# Patient Record
Sex: Male | Born: 1935 | Race: White | Hispanic: No | Smoking: Former smoker
Health system: Southern US, Community
[De-identification: ages and names within clinical notes are randomized; demographics above are authoritative.]

## PROBLEM LIST (undated history)

## (undated) DIAGNOSIS — E785 Hyperlipidemia, unspecified: Secondary | ICD-10-CM

## (undated) DIAGNOSIS — N183 Chronic kidney disease, stage 3 unspecified: Secondary | ICD-10-CM

## (undated) DIAGNOSIS — D649 Anemia, unspecified: Secondary | ICD-10-CM

## (undated) DIAGNOSIS — I447 Left bundle-branch block, unspecified: Secondary | ICD-10-CM

## (undated) DIAGNOSIS — Z8489 Family history of other specified conditions: Secondary | ICD-10-CM

## (undated) DIAGNOSIS — I10 Essential (primary) hypertension: Secondary | ICD-10-CM

## (undated) DIAGNOSIS — R972 Elevated prostate specific antigen [PSA]: Secondary | ICD-10-CM

## (undated) DIAGNOSIS — Z8709 Personal history of other diseases of the respiratory system: Secondary | ICD-10-CM

## (undated) DIAGNOSIS — K219 Gastro-esophageal reflux disease without esophagitis: Secondary | ICD-10-CM

## (undated) DIAGNOSIS — J449 Chronic obstructive pulmonary disease, unspecified: Secondary | ICD-10-CM

## (undated) DIAGNOSIS — N289 Disorder of kidney and ureter, unspecified: Secondary | ICD-10-CM

## (undated) DIAGNOSIS — I2510 Atherosclerotic heart disease of native coronary artery without angina pectoris: Secondary | ICD-10-CM

## (undated) HISTORY — PX: RECTAL SURGERY: SHX760

## (undated) HISTORY — PX: COLONOSCOPY WITH ESOPHAGOGASTRODUODENOSCOPY (EGD): SHX5779

## (undated) HISTORY — PX: APPENDECTOMY: SHX54

## (undated) NOTE — *Deleted (*Deleted)
07/13/2020  No chief complaint on file.    ZOX:WRUE A Lonnie [name]. is a 46 y.o. male who returns for a cystoscopy/TRUS.   Please see previous notes for details.     There were no vitals taken for this visit. NED. A&Ox3.   No respiratory distress   Abd soft, NT, ND Normal phallus with bilateral descended testicles    Cystoscopy Procedure Note  Patient identification was confirmed, informed consent was obtained, and patient was prepped using Betadine solution.  Lidocaine jelly was administered per urethral meatus.    Preoperative abx where received prior to procedure.     Pre-Procedure: - Inspection reveals a normal caliber ureteral meatus.  Procedure: The flexible cystoscope was introduced without difficulty - No urethral strictures/lesions are present. - {Blank multiple:19197::"Enlarged","Surgically absent","Normal"} prostate *** - {Blank multiple:19197::"Normal","Elevated","Tight"} bladder neck - Bilateral ureteral orifices identified - Bladder mucosa  reveals no ulcers, tumors, or lesions - No bladder stones - No trabeculation  Retroflexion shows ***   Post-Procedure: - Patient tolerated the procedure well    Prostate transrectal ultrasound sizing   Informed consent was obtained after discussing risks/benefits of the procedure.  A time out was performed to ensure correct patient identity.   Pre-Procedure: -Transrectal probe was placed without difficulty -Transrectal Ultrasound performed revealing a *** gm prostate measuring *** x *** x *** cm (length) -No significant hypoechoic or median lobe noted      Assessment/ Plan:  1. BPH with incomplete bladder emptying  - The patient is a good/poor candidate for Urolift  I, Theador Hawthorne, am acting as a scribe for Dr. Lorin Picket C. Stoioff,  {Add Holiday representative

---

## 2004-12-28 ENCOUNTER — Ambulatory Visit: Payer: Self-pay | Admitting: Ophthalmology

## 2005-01-04 ENCOUNTER — Ambulatory Visit: Payer: Self-pay | Admitting: Ophthalmology

## 2005-04-06 ENCOUNTER — Emergency Department (HOSPITAL_COMMUNITY): Admission: EM | Admit: 2005-04-06 | Discharge: 2005-04-06 | Payer: Self-pay | Admitting: Emergency Medicine

## 2005-04-25 ENCOUNTER — Ambulatory Visit: Payer: Self-pay | Admitting: Internal Medicine

## 2006-07-05 ENCOUNTER — Ambulatory Visit: Payer: Self-pay | Admitting: Internal Medicine

## 2006-11-06 ENCOUNTER — Ambulatory Visit: Payer: Self-pay | Admitting: Specialist

## 2009-04-09 ENCOUNTER — Ambulatory Visit: Payer: Self-pay | Admitting: Specialist

## 2009-07-24 ENCOUNTER — Ambulatory Visit: Payer: Self-pay | Admitting: Internal Medicine

## 2009-10-07 ENCOUNTER — Ambulatory Visit: Payer: Self-pay | Admitting: Specialist

## 2009-11-13 DIAGNOSIS — C4491 Basal cell carcinoma of skin, unspecified: Secondary | ICD-10-CM

## 2009-11-13 HISTORY — DX: Basal cell carcinoma of skin, unspecified: C44.91

## 2010-04-06 ENCOUNTER — Ambulatory Visit: Payer: Self-pay | Admitting: Specialist

## 2012-03-16 ENCOUNTER — Ambulatory Visit: Payer: Self-pay | Admitting: Specialist

## 2012-03-16 LAB — CREATININE, SERUM: Creatinine: 1.66 mg/dL — ABNORMAL HIGH (ref 0.60–1.30)

## 2012-09-17 ENCOUNTER — Ambulatory Visit: Payer: Self-pay | Admitting: Unknown Physician Specialty

## 2012-09-20 LAB — PATHOLOGY REPORT

## 2012-09-29 ENCOUNTER — Inpatient Hospital Stay: Payer: Self-pay | Admitting: Internal Medicine

## 2012-09-29 LAB — COMPREHENSIVE METABOLIC PANEL
Albumin: 3.7 g/dL (ref 3.4–5.0)
Alkaline Phosphatase: 93 U/L (ref 50–136)
Calcium, Total: 8.6 mg/dL (ref 8.5–10.1)
Creatinine: 2.02 mg/dL — ABNORMAL HIGH (ref 0.60–1.30)
EGFR (Non-African Amer.): 30 — ABNORMAL LOW
Glucose: 83 mg/dL (ref 65–99)
Osmolality: 284 (ref 275–301)
SGPT (ALT): 24 U/L (ref 12–78)
Sodium: 139 mmol/L (ref 136–145)

## 2012-09-29 LAB — TROPONIN I: Troponin-I: <0.02 ng/mL

## 2012-09-29 LAB — URINALYSIS, COMPLETE
Bacteria: NONE SEEN
Bilirubin,UR: NEGATIVE
Glucose,UR: NEGATIVE mg/dL (ref 0–75)
Leukocyte Esterase: NEGATIVE
Nitrite: NEGATIVE
RBC,UR: 1 /HPF (ref 0–5)
Specific Gravity: 1.017 (ref 1.003–1.030)
Squamous Epithelial: NONE SEEN
WBC UR: 1 /HPF (ref 0–5)

## 2012-09-29 LAB — RETICULOCYTES
Absolute Retic Count: 0.0492 10*6/uL (ref 0.031–0.129)
Reticulocyte: 1.26 % (ref 0.7–2.5)

## 2012-09-29 LAB — CBC
HGB: 12.7 g/dL — ABNORMAL LOW (ref 13.0–18.0)
RBC: 3.89 10*6/uL — ABNORMAL LOW (ref 4.40–5.90)

## 2012-09-29 LAB — LACTATE DEHYDROGENASE: LDH: 176 U/L (ref 85–241)

## 2012-09-30 LAB — BASIC METABOLIC PANEL
Anion Gap: 8 (ref 7–16)
BUN: 37 mg/dL — ABNORMAL HIGH (ref 7–18)
Chloride: 109 mmol/L — ABNORMAL HIGH (ref 98–107)
Creatinine: 2.18 mg/dL — ABNORMAL HIGH (ref 0.60–1.30)
EGFR (Non-African Amer.): 27 — ABNORMAL LOW
Sodium: 140 mmol/L (ref 136–145)

## 2012-09-30 LAB — CBC WITH DIFFERENTIAL/PLATELET
Basophil #: 0 10*3/uL (ref 0.0–0.1)
Basophil %: 0.2 %
Eosinophil #: 0 10*3/uL (ref 0.0–0.7)
Eosinophil %: 0 %
HGB: 10.7 g/dL — ABNORMAL LOW (ref 13.0–18.0)
Lymphocyte %: 4.1 %
MCH: 32.3 pg (ref 26.0–34.0)
MCHC: 34.9 g/dL (ref 32.0–36.0)
Neutrophil #: 7.3 10*3/uL — ABNORMAL HIGH (ref 1.4–6.5)
Neutrophil %: 91.2 %
Platelet: 99 10*3/uL — ABNORMAL LOW (ref 150–440)
RDW: 13.5 % (ref 11.5–14.5)

## 2012-10-01 LAB — BASIC METABOLIC PANEL
Calcium, Total: 8.8 mg/dL (ref 8.5–10.1)
Chloride: 107 mmol/L (ref 98–107)
Co2: 21 mmol/L (ref 21–32)
Glucose: 133 mg/dL — ABNORMAL HIGH (ref 65–99)
Osmolality: 284 (ref 275–301)
Potassium: 4 mmol/L (ref 3.5–5.1)

## 2012-10-01 LAB — CBC WITH DIFFERENTIAL/PLATELET
Basophil %: 0.1 %
Eosinophil #: 0 10*3/uL (ref 0.0–0.7)
Eosinophil %: 0 %
HGB: 10.4 g/dL — ABNORMAL LOW (ref 13.0–18.0)
Lymphocyte #: 0.6 10*3/uL — ABNORMAL LOW (ref 1.0–3.6)
MCH: 31.4 pg (ref 26.0–34.0)
MCHC: 34.5 g/dL (ref 32.0–36.0)
MCV: 91 fL (ref 80–100)
Monocyte #: 0.6 x10 3/mm (ref 0.2–1.0)
Neutrophil #: 11.5 10*3/uL — ABNORMAL HIGH (ref 1.4–6.5)
Neutrophil %: 91.1 %
RBC: 3.32 10*6/uL — ABNORMAL LOW (ref 4.40–5.90)

## 2012-10-02 LAB — BASIC METABOLIC PANEL
Calcium, Total: 9.2 mg/dL (ref 8.5–10.1)
Co2: 21 mmol/L (ref 21–32)
EGFR (African American): 41 — ABNORMAL LOW
EGFR (Non-African Amer.): 35 — ABNORMAL LOW
Glucose: 120 mg/dL — ABNORMAL HIGH (ref 65–99)
Osmolality: 293 (ref 275–301)
Sodium: 141 mmol/L (ref 136–145)

## 2012-10-05 LAB — CULTURE, BLOOD (SINGLE)

## 2013-05-13 DIAGNOSIS — R972 Elevated prostate specific antigen [PSA]: Secondary | ICD-10-CM | POA: Insufficient documentation

## 2013-11-17 ENCOUNTER — Ambulatory Visit: Payer: Self-pay | Admitting: Physician Assistant

## 2014-02-21 ENCOUNTER — Ambulatory Visit: Payer: Self-pay | Admitting: Physician Assistant

## 2014-03-06 DIAGNOSIS — J449 Chronic obstructive pulmonary disease, unspecified: Secondary | ICD-10-CM | POA: Insufficient documentation

## 2014-04-29 ENCOUNTER — Ambulatory Visit: Payer: Self-pay | Admitting: Specialist

## 2014-06-11 DIAGNOSIS — R0600 Dyspnea, unspecified: Secondary | ICD-10-CM | POA: Insufficient documentation

## 2014-06-11 DIAGNOSIS — Z87898 Personal history of other specified conditions: Secondary | ICD-10-CM | POA: Insufficient documentation

## 2014-06-11 DIAGNOSIS — I447 Left bundle-branch block, unspecified: Secondary | ICD-10-CM | POA: Insufficient documentation

## 2014-06-11 DIAGNOSIS — E785 Hyperlipidemia, unspecified: Secondary | ICD-10-CM | POA: Insufficient documentation

## 2014-06-11 DIAGNOSIS — I10 Essential (primary) hypertension: Secondary | ICD-10-CM | POA: Insufficient documentation

## 2014-06-11 DIAGNOSIS — Z8709 Personal history of other diseases of the respiratory system: Secondary | ICD-10-CM | POA: Insufficient documentation

## 2014-06-11 DIAGNOSIS — R42 Dizziness and giddiness: Secondary | ICD-10-CM | POA: Insufficient documentation

## 2015-01-20 NOTE — H&P (Signed)
DATE OF BIRTH:  1926-12-14  CHIEF COMPLAINT:  Shortness of breath and vomiting.   HISTORY OF PRESENT ILLNESS:  The patient is an 81 year old white male with a history of COPD, ex-tobacco history, who developed a 4-day history of cough, which was productive, accompanied by subjective fevers and chills after exposure to wife, who was ill. The patient also had undergone an EGD and colonoscopy 10 days ago for heme-positive stools. The patient was using over-the-counter medications to treat his symptoms. On the morning of admission, he developed sudden onset of postprandial vomiting and gurgling sound according to his wife. He was noted in the Emergency Room to be cyanotic and lethargic, but responded dramatically to BiPAP and avoided intubation. At the time of my evaluation, he was awake and alert and tolerating BiPAP well, but complaining of thirst and sore throat.   PRIMARY CARE DOCTOR:  Dr. Melina Modena.   PULMONOLOGIST:  Dr. Wallene Huh.   PAST MEDICAL HISTORY:  Notable for COPD, iron deficiency anemia, benign prostatic hypertrophy, hypertension and left bundle branch block.   PAST SURGICAL HISTORY:  Cataract excision from left eye April 2006 and cataract excision from right eye in December 2001.   ALLERGIES:  He has no known drug allergies.   HOSPITALIZATIONS:  He has not been hospitalized in over 10 years.   FAMILY HISTORY:  Notable for hypertension.   SOCIAL HISTORY:  He is a nonsmoker, nondrinker. He is an ex-tobacco user, having quit over 10 years ago. He lives at home with his wife.   REVIEW OF SYSTEMS: GENERAL:  Positive for subjective fevers and weight loss due to anorexia for the past several days.  HEENT:  He denies any changes in vision. He does have a history of cataracts with both of them having been removed. He has no history of seasonal rhinitis or sinus pain, but does note some postnasal drip and sore throat. He denies difficulty swallowing.  RESPIRATORY:  Review of systems  positive for cough, wheezing and dyspnea. He denies painful respirations. He does have a history of COPD.  CARDIOVASCULAR:  He denies chest pain, orthopnea, edema and arrhythmia, but does have dyspnea on exertion. No history of palpitations or syncope.  GASTROINTESTINAL AND GENITOURINARY:  He does have sudden onset of nausea and vomiting today after breakfast. No abdominal pain. Some chronic constipation managed with prune juice. No recent changes in bladder or bowel habits. No history of incontinence.  ENDOCRINE:  He denies polyuria, nocturia and thyroid problems.  HEMATOLOGIC:  He does have a history of anemia, but has had no bleeding or bruising.  MUSCULOSKELETAL:  He does have chronic low back pain with some sciatica. He denies any history of neck, knee or hip pain.  NEUROLOGIC:  He denies numbness, weakness, dysarthria or ataxia. No history of strokes or TIAs or seizures. No history of memory loss. He has a history of insomnia treated with zolpidem.   PHYSICAL EXAMINATION:  GENERAL:  This is a well-nourished elderly male currently awake and alert on BiPAP.  VITAL SIGNS:  Initial blood pressure 157/64, repeat 135/50. Pulse was initially 107 and regular, now 90. T-max was 102.2. The patient is currently on BiPAP 10/5 with a pressure support of 5.  HEENT:  Pupils are equal, round and reactive to light. Extraocular movements are intact. Sclerae are anicteric. Oropharynx was not checked due to current use of BiPAP. NECK:  Supple without lymphadenopathy, JVD, thyromegaly or carotid bruits.  LUNGS:  Notable for diminished breath sounds bilaterally with some  wheezing noted. Increased effort noted as well.  CARDIOVASCULAR:  Heart sounds are distant, but regular. Chest wall is nontender. There is no lower extremity edema. Pedal pulses are palpable.  ABDOMEN:  Soft, nontender, nondistended with good bowel sounds and no evidence of hepatosplenomegaly.  MUSCULOSKELETAL:  He has 5/5 strength in all 4  extremities.  SKIN:  Warm and dry without rashes or lesions.  LYMPHATICS:  There is no cervical, axillary, inguinal or supraclavicular lymphadenopathy.  NEUROLOGICAL:  Exam is grossly nonfocal with intact cranial nerves and deep tendon reflexes. The patient is alert and oriented to person, place and time.   ADMISSION LABORATORY DATA:  Sodium 139, potassium 4.1, chloride 107, bicarb 24, BUN 33, creatinine 2.02, glucose 83. White count 6.6, hemoglobin 12.7, platelets 123. Magnesium 1.6. Urinalysis normal. EKG shows a left bundle branch block, which is chronic according to family. Portable chest x-ray shows developing right perihilar infiltrate.   ASSESSMENT AND PLAN:  1.  Acute respiratory failure. The patient avoided intubation by responding dramatically to BiPAP. He will be admitted to the ICU for continued close respiratory monitoring and continued BiPAP while empiric antibiotics and steroids will be started.  2.  Right perihilar pneumonia. I will broaden his coverage to include coverage for an aspiration event since he has had two likely events, one today and one 10 days ago during his EGD. He was given Levaquin in the Emergency Room, but we will change his coverage to Zosyn and azithromycin.  3.  Chronic obstructive pulmonary disease. We will start the patient on steroids at 60 mg with Solu-Medrol IV q. 12 and bronchodilator therapy as well with albuterol and Duonebs.  4.  Hypomagnesemia. Etiology unclear since his potassium is normal. He has had decreased p.o. intake. IV runs started. I have held his diuretic.  5.  Anemia. The patient has had a recent GI workup for iron deficiency anemia, which was largely unrevealing for source. In the light of a current infection, he has a normal white count, and he has thrombocytopenia. This may all be due to acute infection, but also may be due to a bone marrow disorder. We will repeat tomorrow checking retic count and LDH.  6.  History of hypertension. I have  continued verapamil and lisinopril, but have held his diuretic.   ESTIMATED TIME OF CARE:  60 minutes.    ____________________________ Deborra Medina, MD tt:ms D: 09/29/2012 22:59:35 ET T: 09/30/2012 22:49:01 ET JOB#: [id number]  cc: Deborra Medina, MD, <Dictator> Deborra Medina MD ELECTRONICALLY SIGNED 10/02/2012 13:18

## 2015-01-23 NOTE — Discharge Summary (Signed)
PATIENT NAME:  Lonnie [name], Lonnie [name] MR#:  [id number] DATE OF BIRTH:  [birth year]  DATE OF ADMISSION:  09/29/2012 DATE OF DISCHARGE:  10/02/2012  PRIMARY CARE PHYSICIAN:  Vianne Bulls. Arline Asp, MD.  FINAL DIAGNOSES:  1.  Acute respiratory failure, resolved.  2.  Chronic obstructive pulmonary disease exacerbation with history of asbestosis.  3.  Pneumonia.  4.  Acute renal failure/chronic kidney disease.  5.  Thrombocytopenia.  6.  Anemia.  7.  Benign prostatic hypertrophy.   MEDICATIONS ON DISCHARGE:  1.  Verapamil 120 mg 1 tablet daily. 2.  Omeprazole 20 mg 1 capsule twice a day.  3.  Potassium gluconate 595 mg oral tablet twice daily. 4.  Lisinopril 40 mg daily.  5.  Gabapentin 300 mg 2 capsules 3 times daily. 6.  Hydrochlorothiazide 25 mg one-half tablet at daily.  7.  Ambien 10 mg at bedtime p.r.n. insomnia.  8.  Finasteride 5 mg daily.  9.  Ferrous sulfate 325 mg daily.  10.  Aspirin 81 mg daily. 11.  Alfuzosin 10 mg ER daily. 12.  Prednisone 5 mg, 5 tablets day 1, 4 tablets day 2, 3 tablets day 3, two tablets day 4 and 5, 1 tablet day 6 and 7. 13.  Advair Diskus 500/50, 1 inhalation twice daily. 14.  Albuterol ipratropium 0.5 mg/3 mL, 3 mL inhaled 4 times daily. 15.  Spiriva 1 inhalation daily. 16.  Cefuroxime 250 mg, 1 tablet every 12 hours for six days. 17.  Zithromax 250 mg for two more days. 18.  Combivent Respimat CFC free 20 mcg/100 mcg/inhalation, 1 puff 4 times daily.    HOME HEALTH:  Yes. Physical therapy and nurse.   DIET: Low sodium diet, regular consistency.   ACTIVITY: Activity, as tolerated.   REFERRAL: Home health RN and PT, Dr. Arline Asp in one week.   HOSPITAL COURSE: The patient was admitted 09/29/2012 and discharged 10/02/2012.  He  came in with shortness of breath and vomiting.   HISTORY OF PRESENT ILLNESS: This is an 81 year old man with COPD, history of asbestosis, had fever and chills. He was noted to be cyanotic and lethargic and was started on Bi-PAP initially  and admitted to the ICU and treated for pneumonia. Initially started on Levaquin by the ER physician and then Zosyn and azithromycin by the admitting physician.  He was started on IV Solu-Medrol for COPD exacerbation.  For hypomagnesemia he was being given IV magnesium.   LABORATORY AND RADIOLOGICAL DATA DURING HOSPITAL COURSE:  EKG showed a left bundle branch block. ABG showed pH of 7.40, pCO2 of 34, pO2 at 131, FiO2 at 75%, bicarbonate 21.9, oxygen saturation 99% on Bi-PAP. BNP 233. Retic count 1.26, LDH 176. Troponin negative. Magnesium 1.6. White blood cell count 6.6, hemoglobin and hematocrit 12.7 and 36.0, platelet count 122, glucose 83, BUN 33, creatinine 2.02, sodium 139, potassium 4.1, chloride 107, CO2 at 24, calcium 8.6. Liver function tests normal range. Blood cultures negative. Urine cultures negative. Urinalysis negative. Magnesium up at 1.9.  Creatinine went as high as 2.18. Influenza was negative. Creatinine upon discharge 1.74, platelet count upon discharge 104, white blood cell count 12.6.   HOSPITAL COURSE PER PROBLEM LIST:  1.  For the patient's acute respiratory failure, initially required Bi-PAP, on presentation. I saw the patient in the ICU on the 09/30/2012 and he was already on nasal cannula. I transferred him to the floor.  Over the next couple of days we did get him off oxygen altogether.  Pulse oximetry on room  air 94%.  2.  For the patient's COPD exacerbation with history of asbestosis, the patient was started on IV Solu-Medrol, antibiotics, nebulizers. The patient upon discharge still had rhonchi and wheeze in his lung, but he was breathing back to his baseline. He stated he always has some wheeze and rhonchi in his lungs.  He wanted to go home. I did switch the antibiotic over to oral for completion of the course. Prednisone taper was given.  I did add Spiriva to his Advair Diskus.  3.  Pneumonia.  Completion of the course was given.  Initially started on Levaquin and then  switched to Zosyn and Zithromax. I switched him over to Rocephin and Zithromax and upon discharge Ceftin and Zithromax.  4.  Acute renal failure/chronic kidney disease. The patient's creatinine did go up, but then stabilized down at 1.74.  The patient was given IV fluids during the hospital course.  5.  Thrombocytopenia. This is old.  6.  Anemia. Hemoglobin 10.4 upon discharge. The patient is already on iron.  7.  Benign prostatic hypertrophy, on Uroxatral and did have a Foley placed during the hospital stay. He was urinating after Foley was discontinued.  8.  Hypertension. Blood pressure was borderline during the hospital stay. Initially low and then meds were held, secondary to acute renal failure and then restarted upon discharge. Blood pressure upon discharge was 169/74.   Time spent on discharge: 35 minutes.    ____________________________ Tana Conch. Leslye Peer, MD rjw:eg D: 10/02/2012 16:53:20 ET T: 10/02/2012 18:24:10 ET JOB#: [id number]  cc:  1.  Kendrell Lottman J. Leslye Peer, MD, <Dictator> 2.  Vianne Bulls. Arline Asp, MD  Marisue Brooklyn MD ELECTRONICALLY SIGNED 10/04/2012 19:08

## 2015-01-28 DIAGNOSIS — R351 Nocturia: Secondary | ICD-10-CM | POA: Insufficient documentation

## 2015-10-04 DIAGNOSIS — J189 Pneumonia, unspecified organism: Secondary | ICD-10-CM

## 2015-10-04 HISTORY — DX: Pneumonia, unspecified organism: J18.9

## 2016-01-10 ENCOUNTER — Emergency Department: Payer: Medicare Other

## 2016-01-10 ENCOUNTER — Inpatient Hospital Stay
Admission: EM | Admit: 2016-01-10 | Discharge: 2016-01-18 | DRG: 190 | Disposition: A | Discharge status: Skilled Nursing Facility | Payer: Medicare Other | Attending: Internal Medicine | Admitting: Internal Medicine

## 2016-01-10 ENCOUNTER — Encounter: Payer: Self-pay | Admitting: Emergency Medicine

## 2016-01-10 DIAGNOSIS — E785 Hyperlipidemia, unspecified: Secondary | ICD-10-CM | POA: Diagnosis present

## 2016-01-10 DIAGNOSIS — G629 Polyneuropathy, unspecified: Secondary | ICD-10-CM | POA: Diagnosis present

## 2016-01-10 DIAGNOSIS — J440 Chronic obstructive pulmonary disease with acute lower respiratory infection: Secondary | ICD-10-CM | POA: Diagnosis not present

## 2016-01-10 DIAGNOSIS — W19XXXA Unspecified fall, initial encounter: Secondary | ICD-10-CM | POA: Diagnosis present

## 2016-01-10 DIAGNOSIS — Z8249 Family history of ischemic heart disease and other diseases of the circulatory system: Secondary | ICD-10-CM | POA: Diagnosis not present

## 2016-01-10 DIAGNOSIS — Z87891 Personal history of nicotine dependence: Secondary | ICD-10-CM

## 2016-01-10 DIAGNOSIS — Z9049 Acquired absence of other specified parts of digestive tract: Secondary | ICD-10-CM

## 2016-01-10 DIAGNOSIS — N400 Enlarged prostate without lower urinary tract symptoms: Secondary | ICD-10-CM | POA: Diagnosis present

## 2016-01-10 DIAGNOSIS — Z8619 Personal history of other infectious and parasitic diseases: Secondary | ICD-10-CM | POA: Diagnosis not present

## 2016-01-10 DIAGNOSIS — J61 Pneumoconiosis due to asbestos and other mineral fibers: Secondary | ICD-10-CM | POA: Diagnosis present

## 2016-01-10 DIAGNOSIS — K219 Gastro-esophageal reflux disease without esophagitis: Secondary | ICD-10-CM | POA: Diagnosis present

## 2016-01-10 DIAGNOSIS — Z79899 Other long term (current) drug therapy: Secondary | ICD-10-CM | POA: Diagnosis not present

## 2016-01-10 DIAGNOSIS — I10 Essential (primary) hypertension: Secondary | ICD-10-CM | POA: Diagnosis present

## 2016-01-10 DIAGNOSIS — Y939 Activity, unspecified: Secondary | ICD-10-CM | POA: Diagnosis not present

## 2016-01-10 DIAGNOSIS — I2510 Atherosclerotic heart disease of native coronary artery without angina pectoris: Secondary | ICD-10-CM | POA: Diagnosis present

## 2016-01-10 DIAGNOSIS — S41112A Laceration without foreign body of left upper arm, initial encounter: Secondary | ICD-10-CM | POA: Diagnosis present

## 2016-01-10 DIAGNOSIS — J9621 Acute and chronic respiratory failure with hypoxia: Secondary | ICD-10-CM | POA: Diagnosis present

## 2016-01-10 DIAGNOSIS — I447 Left bundle-branch block, unspecified: Secondary | ICD-10-CM | POA: Diagnosis present

## 2016-01-10 DIAGNOSIS — S0990XA Unspecified injury of head, initial encounter: Secondary | ICD-10-CM

## 2016-01-10 DIAGNOSIS — Z7982 Long term (current) use of aspirin: Secondary | ICD-10-CM

## 2016-01-10 DIAGNOSIS — K5900 Constipation, unspecified: Secondary | ICD-10-CM | POA: Diagnosis not present

## 2016-01-10 DIAGNOSIS — J189 Pneumonia, unspecified organism: Secondary | ICD-10-CM | POA: Diagnosis present

## 2016-01-10 HISTORY — DX: Essential (primary) hypertension: I10

## 2016-01-10 HISTORY — DX: Elevated prostate specific antigen (PSA): R97.20

## 2016-01-10 HISTORY — DX: Disorder of kidney and ureter, unspecified: N28.9

## 2016-01-10 HISTORY — DX: Left bundle-branch block, unspecified: I44.7

## 2016-01-10 HISTORY — DX: Chronic obstructive pulmonary disease, unspecified: J44.9

## 2016-01-10 HISTORY — DX: Atherosclerotic heart disease of native coronary artery without angina pectoris: I25.10

## 2016-01-10 LAB — LACTIC ACID, PLASMA
Lactic Acid, Venous: 1.6 mmol/L (ref 0.5–2.0)
Lactic Acid, Venous: 1.4 mmol/L (ref 0.5–2.0)

## 2016-01-10 LAB — BASIC METABOLIC PANEL WITH GFR
Anion gap: 6 (ref 5–15)
BUN: 28 mg/dL — ABNORMAL HIGH (ref 6–20)
CO2: 23 mmol/L (ref 22–32)
Calcium: 8.9 mg/dL (ref 8.9–10.3)
Chloride: 106 mmol/L (ref 101–111)
Creatinine, Ser: 1.65 mg/dL — ABNORMAL HIGH (ref 0.61–1.24)
GFR calc Af Amer: 41 mL/min — ABNORMAL LOW
GFR calc non Af Amer: 36 mL/min — ABNORMAL LOW
Glucose, Bld: 92 mg/dL (ref 65–99)
Potassium: 4.3 mmol/L (ref 3.5–5.1)
Sodium: 135 mmol/L (ref 135–145)

## 2016-01-10 LAB — CBC WITH DIFFERENTIAL/PLATELET
Basophils Absolute: 0 10*3/uL (ref 0–0.1)
Basophils Relative: 0 %
Eosinophils Absolute: 0 10*3/uL (ref 0–0.7)
Eosinophils Relative: 0 %
HEMATOCRIT: 29.3 % — AB (ref 40.0–52.0)
Hemoglobin: 9.8 g/dL — ABNORMAL LOW (ref 13.0–18.0)
LYMPHS PCT: 5 %
Lymphs Abs: 0.7 10*3/uL — ABNORMAL LOW (ref 1.0–3.6)
MCH: 31.5 pg (ref 26.0–34.0)
MCHC: 33.5 g/dL (ref 32.0–36.0)
MCV: 94 fL (ref 80.0–100.0)
MONO ABS: 1.4 10*3/uL — AB (ref 0.2–1.0)
MONOS PCT: 10 %
NEUTROS ABS: 11.6 10*3/uL — AB (ref 1.4–6.5)
Neutrophils Relative %: 85 %
Platelets: 204 10*3/uL (ref 150–440)
RBC: 3.12 MIL/uL — ABNORMAL LOW (ref 4.40–5.90)
RDW: 13.6 % (ref 11.5–14.5)
WBC: 13.7 10*3/uL — ABNORMAL HIGH (ref 3.8–10.6)

## 2016-01-10 LAB — INFLUENZA PANEL BY PCR (TYPE A & B)
H1N1 flu by pcr: NOT DETECTED
INFLAPCR: NEGATIVE
Influenza B By PCR: NEGATIVE

## 2016-01-10 MED ORDER — ACETAMINOPHEN 325 MG PO TABS
650.0000 mg | ORAL_TABLET | Freq: Four times a day (QID) | ORAL | Status: DC | PRN
  Administered 2016-01-15 – 2016-01-17 (×2): 650 mg via ORAL
  Filled 2016-01-10 (×3): qty 2

## 2016-01-10 MED ORDER — FERROUS SULFATE 325 (65 FE) MG PO TABS
325.0000 mg | ORAL_TABLET | Freq: Every day | ORAL | Status: DC
  Administered 2016-01-11 – 2016-01-18 (×8): 325 mg via ORAL
  Filled 2016-01-10 (×9): qty 1

## 2016-01-10 MED ORDER — MAGNESIUM OXIDE 400 (241.3 MG) MG PO TABS
400.0000 mg | ORAL_TABLET | Freq: Every day | ORAL | Status: DC
  Administered 2016-01-11 – 2016-01-18 (×8): 400 mg via ORAL
  Filled 2016-01-10 (×8): qty 1

## 2016-01-10 MED ORDER — SODIUM CHLORIDE 0.9 % IV SOLN
INTRAVENOUS | Status: DC
  Administered 2016-01-10 – 2016-01-13 (×5): via INTRAVENOUS

## 2016-01-10 MED ORDER — TRIAMCINOLONE ACETONIDE 0.1 % EX CREA
1.0000 "application " | TOPICAL_CREAM | Freq: Two times a day (BID) | CUTANEOUS | Status: DC
  Administered 2016-01-11 – 2016-01-17 (×11): 1 via TOPICAL
  Filled 2016-01-10: qty 15

## 2016-01-10 MED ORDER — ACETAMINOPHEN 650 MG RE SUPP: 650.0000 mg | Freq: Four times a day (QID) | RECTAL | Status: DC | PRN

## 2016-01-10 MED ORDER — RAMELTEON 8 MG PO TABS
8.0000 mg | ORAL_TABLET | Freq: Every day | ORAL | Status: DC
  Administered 2016-01-10 – 2016-01-17 (×8): 8 mg via ORAL
  Filled 2016-01-10 (×9): qty 1

## 2016-01-10 MED ORDER — ONDANSETRON HCL 4 MG/2ML IJ SOLN
4.0000 mg | Freq: Four times a day (QID) | INTRAMUSCULAR | Status: DC | PRN
Start: 2016-01-10 — End: 2016-01-18

## 2016-01-10 MED ORDER — LEVOFLOXACIN IN D5W 750 MG/150ML IV SOLN: 750.0000 mg | INTRAVENOUS | Status: DC

## 2016-01-10 MED ORDER — ASPIRIN 81 MG PO CHEW
81.0000 mg | CHEWABLE_TABLET | Freq: Every day | ORAL | Status: DC
  Administered 2016-01-11 – 2016-01-16 (×6): 81 mg via ORAL
  Filled 2016-01-10 (×6): qty 1

## 2016-01-10 MED ORDER — VITAMIN C 500 MG PO TABS
1000.0000 mg | ORAL_TABLET | Freq: Every day | ORAL | Status: DC
  Administered 2016-01-11 – 2016-01-18 (×8): 1000 mg via ORAL
  Filled 2016-01-10 (×8): qty 2

## 2016-01-10 MED ORDER — TRAMADOL HCL 50 MG PO TABS
50.0000 mg | ORAL_TABLET | Freq: Four times a day (QID) | ORAL | Status: DC | PRN
  Administered 2016-01-11: 50 mg via ORAL
  Filled 2016-01-10: qty 1

## 2016-01-10 MED ORDER — ZOLPIDEM TARTRATE 5 MG PO TABS
10.0000 mg | ORAL_TABLET | Freq: Every evening | ORAL | Status: DC | PRN
  Administered 2016-01-13 – 2016-01-16 (×3): 10 mg via ORAL
  Filled 2016-01-10 (×3): qty 2

## 2016-01-10 MED ORDER — IPRATROPIUM-ALBUTEROL 0.5-2.5 (3) MG/3ML IN SOLN
3.0000 mL | RESPIRATORY_TRACT | Status: DC | PRN
  Administered 2016-01-11 – 2016-01-12 (×2): 3 mL via RESPIRATORY_TRACT
  Filled 2016-01-10 (×2): qty 3

## 2016-01-10 MED ORDER — ALFUZOSIN HCL ER 10 MG PO TB24
10.0000 mg | ORAL_TABLET | Freq: Every day | ORAL | Status: DC
  Administered 2016-01-11 – 2016-01-18 (×8): 10 mg via ORAL
  Filled 2016-01-10 (×10): qty 1

## 2016-01-10 MED ORDER — SODIUM CHLORIDE 0.9 % IV BOLUS (SEPSIS)
1000.0000 mL | Freq: Once | INTRAVENOUS | Status: AC
  Administered 2016-01-10: 1000 mL via INTRAVENOUS

## 2016-01-10 MED ORDER — ONDANSETRON HCL 4 MG PO TABS: 4.0000 mg | ORAL_TABLET | Freq: Four times a day (QID) | ORAL | Status: DC | PRN

## 2016-01-10 MED ORDER — GUAIFENESIN-DM 100-10 MG/5ML PO SYRP
5.0000 mL | ORAL_SOLUTION | ORAL | Status: DC | PRN
  Administered 2016-01-10 – 2016-01-11 (×2): 5 mL via ORAL
  Filled 2016-01-10 (×2): qty 5

## 2016-01-10 MED ORDER — LISINOPRIL 20 MG PO TABS
40.0000 mg | ORAL_TABLET | Freq: Every day | ORAL | Status: DC
  Administered 2016-01-11 – 2016-01-18 (×8): 40 mg via ORAL
  Filled 2016-01-10 (×8): qty 2

## 2016-01-10 MED ORDER — ATORVASTATIN CALCIUM 20 MG PO TABS
80.0000 mg | ORAL_TABLET | Freq: Every day | ORAL | Status: DC
  Administered 2016-01-10 – 2016-01-18 (×9): 80 mg via ORAL
  Filled 2016-01-10 (×9): qty 4

## 2016-01-10 MED ORDER — PANTOPRAZOLE SODIUM 40 MG PO TBEC
40.0000 mg | DELAYED_RELEASE_TABLET | Freq: Every day | ORAL | Status: DC
  Administered 2016-01-11 – 2016-01-18 (×8): 40 mg via ORAL
  Filled 2016-01-10 (×8): qty 1

## 2016-01-10 MED ORDER — LEVOFLOXACIN IN D5W 750 MG/150ML IV SOLN
750.0000 mg | Freq: Once | INTRAVENOUS | Status: AC
  Administered 2016-01-10: 750 mg via INTRAVENOUS
  Filled 2016-01-10: qty 150

## 2016-01-10 MED ORDER — GABAPENTIN 300 MG PO CAPS
900.0000 mg | ORAL_CAPSULE | Freq: Every evening | ORAL | Status: DC
  Administered 2016-01-10 – 2016-01-11 (×2): 900 mg via ORAL
  Filled 2016-01-10 (×2): qty 3

## 2016-01-10 MED ORDER — HEPARIN SODIUM (PORCINE) 5000 UNIT/ML IJ SOLN
5000.0000 [IU] | Freq: Three times a day (TID) | INTRAMUSCULAR | Status: DC
Start: 2016-01-10 — End: 2016-01-11
  Administered 2016-01-10 – 2016-01-11 (×2): 5000 [IU] via SUBCUTANEOUS
  Filled 2016-01-10 (×2): qty 1

## 2016-01-10 MED ORDER — GABAPENTIN 300 MG PO CAPS
600.0000 mg | ORAL_CAPSULE | ORAL | Status: DC
  Administered 2016-01-11 – 2016-01-12 (×2): 600 mg via ORAL
  Filled 2016-01-10 (×2): qty 2

## 2016-01-10 NOTE — Progress Notes (Signed)
Pharmacy Antibiotic Note  Lonnie [name]. is a 82 y.o. male admitted on 01/10/2016 with pneumonia.  Pharmacy has been consulted for levofloxacin dosing.  Plan: Patient received levofloxacin 750 mg IV dose x1 today. Will order levofloxacin 750 mg IV q48h to begin on 4/11.  Height: 5\' 11"  (180.3 cm) Weight: 182 lb 12.8 oz (82.918 kg) IBW/kg (Calculated) : 75.3  Temp (24hrs), Avg:98.5 F (36.9 C), Min:98.5 F (36.9 C), Max:98.5 F (36.9 C)   Recent Labs Lab 01/10/16 1416 01/10/16 1642  WBC 13.7*  --   CREATININE 1.65*  --   LATICACIDVEN 1.6 1.4    Estimated Creatinine Clearance: 33.6 mL/min (by C-G formula based on Cr of 1.65).    Allergies  Allergen Reactions  . Sulfa Antibiotics Other (See Comments)    Not sure of reaction. It was too long ago, but he said not to give it to him.     Antimicrobials this admission: levofloxacin 4/9 >>   Microbiology results: 4/9 BCx: Sent  Thank you for allowing pharmacy to be a part of this patient's care.  Lonnie [name], PharmD Clinical Pharmacist 01/10/2016 8:36 PM

## 2016-01-10 NOTE — H&P (Signed)
[facility] NAME: Lonnie [name]    MR#:  [id number]  DATE OF BIRTH:  September 10, 1927  DATE OF ADMISSION:  01/10/2016  PRIMARY CARE PHYSICIAN: BABAOFF, Caryl Bis, MD   REQUESTING/REFERRING PHYSICIAN: Dr. Nance Pear  CHIEF COMPLAINT:   Chief Complaint  Patient presents with  . Influenza  Weakness, fall.  HISTORY OF PRESENT ILLNESS:  Lonnie [name]  is a 82 y.o. male with a known history of COPD, asbestosis, hypertension, history of coronary artery disease, BPH, neuropathy who presents to the hospital due to generalized weakness and a fall and noted to have pneumonia. Patient was recently diagnosed with the flu about a week to 10 days ago and has finished a 5 day course of Tamiflu. His wife recently died in this hospital from complications of the flu, he has not clinically improved and is still has generalized weakness with some myalgias and therefore was brought to the hospital for further evaluation. Patient underwent a chest x-ray in the emergency room which showed evidence of a pneumonia on the left lower lobe. Hospitalist services were contacted further treatment and evaluation. Patient denies any prodromal symptoms of chest pain, shortness of breath, palpitations, nausea, vomiting, dizziness prior to his fall today.  PAST MEDICAL HISTORY:   Past Medical History  Diagnosis Date  . COPD (chronic obstructive pulmonary disease) ([place])   . Hypertension   . Coronary artery disease   . Bundle branch block, left   . Renal disorder   . PSA elevation     PAST SURGICAL HISTORY:   Past Surgical History  Procedure Laterality Date  . Rectal surgery      x3  . Appendectomy      SOCIAL HISTORY:   Social History  Substance Use Topics  . Smoking status: Former Smoker -- 2.00 packs/day for 50 years    Types: Cigarettes  . Smokeless tobacco: Not on file  . Alcohol Use: 0.0 oz/week    0 Standard drinks or equivalent per week     FAMILY HISTORY:   Family History  Problem Relation Age of Onset  . Heart attack Mother   . Heart attack Father     DRUG ALLERGIES:  No Known Allergies  REVIEW OF SYSTEMS:   Review of Systems  Constitutional: Positive for malaise/fatigue. Negative for fever and weight loss.  HENT: Negative for congestion, nosebleeds and tinnitus.   Eyes: Negative for blurred vision, double vision and redness.  Respiratory: Positive for cough, shortness of breath and wheezing. Negative for hemoptysis.   Cardiovascular: Negative for chest pain, orthopnea, leg swelling and PND.  Gastrointestinal: Negative for nausea, vomiting, abdominal pain, diarrhea and melena.  Genitourinary: Negative for dysuria, urgency and hematuria.  Musculoskeletal: Negative for joint pain and falls.  Neurological: Positive for weakness. Negative for dizziness, tingling, sensory change, focal weakness, seizures and headaches.  Endo/Heme/Allergies: Negative for polydipsia. Does not bruise/bleed easily.  Psychiatric/Behavioral: Negative for depression and memory loss. The patient is not nervous/anxious.     MEDICATIONS AT HOME:   Prior to Admission medications   Medication Sig Start Date End Date Taking? Authorizing Provider  albuterol (ACCUNEB) 0.63 MG/3ML nebulizer solution Take 1 ampule by nebulization every 6 (six) hours as needed for wheezing.   Yes Historical Provider, MD  alfuzosin (UROXATRAL) 10 MG 24 hr tablet Take 10 mg by mouth daily with breakfast.   Yes Historical Provider, MD  ascorbic acid (VITAMIN C) 1000 MG tablet Take 1,000 mg by mouth  daily.   Yes Historical Provider, MD  aspirin 81 MG tablet Take 81 mg by mouth daily.   Yes Historical Provider, MD  atorvastatin (LIPITOR) 80 MG tablet Take 80 mg by mouth daily.   Yes Historical Provider, MD  ferrous sulfate 325 (65 FE) MG tablet Take 325 mg by mouth daily with breakfast.   Yes Historical Provider, MD  finasteride (PROSCAR) 5 MG tablet Take 5 mg by mouth  daily.   Yes Historical Provider, MD  gabapentin (NEURONTIN) 300 MG capsule Take 600 mg by mouth every morning.    Yes Historical Provider, MD  gabapentin (NEURONTIN) 300 MG capsule Take 900 mg by mouth every evening.   Yes Historical Provider, MD  ipratropium (ATROVENT) 0.02 % nebulizer solution Take 0.5 mg by nebulization 4 (four) times daily.   Yes Historical Provider, MD  lisinopril (PRINIVIL,ZESTRIL) 40 MG tablet Take 40 mg by mouth daily.   Yes Historical Provider, MD  magnesium oxide (MAG-OX) 400 MG tablet Take 400 mg by mouth daily.   Yes Historical Provider, MD  omeprazole (PRILOSEC) 20 MG capsule Take 20 mg by mouth daily.   Yes Historical Provider, MD  traMADol (ULTRAM) 50 MG tablet Take by mouth every 6 (six) hours as needed.   Yes Historical Provider, MD  triamcinolone cream (KENALOG) 0.1 % Apply 1 application topically 2 (two) times daily.   Yes Historical Provider, MD  verapamil (CALAN) 120 MG tablet Take 120 mg by mouth daily.    Yes Historical Provider, MD  zolpidem (AMBIEN) 10 MG tablet Take 10 mg by mouth at bedtime as needed for sleep.   Yes Historical Provider, MD      VITAL SIGNS:  Blood pressure 161/63, pulse 90, temperature 98.5 F (36.9 C), temperature source Oral, resp. rate 21, height 5\' 11"  (1.803 m), weight 79.833 kg (176 lb), SpO2 98 %.  PHYSICAL EXAMINATION:  Physical Exam  GENERAL:  82 y.o.-year-old patient lying in the bed lethargic but in NAD EYES: Pupils equal, round, reactive to light and accommodation. No scleral icterus. Extraocular muscles intact.  HEENT: Head atraumatic, normocephalic. Oropharynx and nasopharynx clear. No oropharyngeal erythema, moist oral mucosa  NECK:  Supple, no jugular venous distention. No thyroid enlargement, no tenderness.  LUNGS: Prolonged inspiratory and expiratory phase, minimal end expiratory wheezing bilaterally, rhonchi/rales at the left lower base. No use of accessory muscles of respiration.  CARDIOVASCULAR: S1, S2 RRR.  No murmurs, rubs, gallops, clicks.  ABDOMEN: Soft, nontender, nondistended. Bowel sounds present. No organomegaly or mass.  EXTREMITIES: No pedal edema, cyanosis, or clubbing. + 2 pedal & radial pulses b/l.   NEUROLOGIC: Cranial nerves II through XII are intact. No focal Motor or sensory deficits appreciated b/l.  Globally weak PSYCHIATRIC: The patient is alert and oriented x 3. Good affect.  SKIN: No obvious rash, lesion, or ulcer.   LABORATORY PANEL:   CBC  Recent Labs Lab 01/10/16 1416  WBC 13.7*  HGB 9.8*  HCT 29.3*  PLT 204   ------------------------------------------------------------------------------------------------------------------  Chemistries   Recent Labs Lab 01/10/16 1416  NA 135  K 4.3  CL 106  CO2 23  GLUCOSE 92  BUN 28*  CREATININE 1.65*  CALCIUM 8.9   ------------------------------------------------------------------------------------------------------------------  Cardiac Enzymes No results for input(s): TROPONINI in the last 168 hours. ------------------------------------------------------------------------------------------------------------------  RADIOLOGY:  Dg Chest 2 View  01/10/2016  CLINICAL DATA:  Prolonged symptoms of weakness and cough related to influenza B. EXAM: CHEST  2 VIEW COMPARISON:  04/26/2014 and 02/21/2014 FINDINGS: Again noted are  bilateral pleural calcifications. Focal airspace disease along the medial left lower lobe. Upper lungs are clear. Heart and mediastinum are stable and within normal limits. Atherosclerotic calcifications in the thoracic aorta. No acute bone abnormalities. IMPRESSION: Airspace disease along the medial left lower lobe. Findings are suggestive for pneumonia. Followup PA and lateral chest X-ray is recommended in 3-4 weeks following trial of antibiotic therapy to ensure resolution and exclude underlying malignancy. Electronically Signed   By: Markus Daft M.D.   On: 01/10/2016 14:23   Ct Head Wo  Contrast  01/10/2016  CLINICAL DATA:  Recent trauma with facial pain, initial encounter EXAM: CT HEAD WITHOUT CONTRAST TECHNIQUE: Contiguous axial images were obtained from the base of the skull through the vertex without intravenous contrast. COMPARISON:  04/06/05 FINDINGS: Mild atrophic changes are seen. Scattered deep white matter hypodensity is noted consistent with chronic white matter ischemic change. No findings to suggest acute hemorrhage, acute infarction or space-occupying mass lesion are noted. The bony calvarium is intact. IMPRESSION: Chronic atrophic and ischemic changes without acute abnormality. Electronically Signed   By: Inez Catalina M.D.   On: 01/10/2016 16:41     IMPRESSION AND PLAN:   82 year old male with past medical history of COPD/asbestosis, BPH, essential hypertension, coronary artery disease who presents to the hospital after generalized weakness and malaise and a recent fall and noted to have pneumonia.  1. Pneumonia-this is superimposed pneumonia after recovering from the flu. -Patient was recently diagnosed with the flu about 10 days ago and has finished treatment with Tamiflu. Now comes in with a left lower lobe infiltrate noted on chest x-ray. -Treat patient with IV Levaquin, follow blood, sputum cultures.  2. Generalized weakness/malaise/falls-secondary to pneumonia, recent flu. -Supportive care with IV fluids, antibiotics as mentioned above. I will recheck flu for PCR. Physical therapy consult to assess mobility.  3. Essential hypertension-continue lisinopril.  4. Hyperlipidemia-continue atorvastatin.  5. Neuropathy-continue gabapentin.  6. GERD-continue Protonix.    All the records are reviewed and case discussed with ED provider. Management plans discussed with the patient, family and they are in agreement.  CODE STATUS: Full code  TOTAL TIME TAKING CARE OF THIS PATIENT: 45 minutes.    Henreitta Leber M.D on 01/10/2016 at 5:48 PM  Between 7am to 6pm  - Pager - [phone]  After 6pm go to [email or web address] - password [email or web address]  Office  [phone]  CC: Primary care physician; BABAOFF, Caryl Bis, MD

## 2016-01-10 NOTE — ED Provider Notes (Signed)
[facility] Emergency Department Provider Note    ____________________________________________  Time seen: ~1430  I have reviewed the triage vital signs and the nursing notes.   HISTORY  Chief Complaint Influenza   History limited by: Not Limited, some history obtained from daughter   HPI Lonnie [name]. is a 82 y.o. male who presents to the emergency department today because of concerns for continued shortness of breath and cough. The patient has had these symptoms for the past 2 weeks. He was diagnosed with influenza B and was put on Tamiflu. Family states that he didn't hear to improve however that for the past couple of days he has gotten worse. He has also had associated symptom of weakness. They did call his primary care who ordered. Levaquin tablets. They state he took one dose yesterday. He fell today continued to be weak so he was transported to the emergency department.    Past Medical History  Diagnosis Date  . COPD (chronic obstructive pulmonary disease) ([place])   . Hypertension   . Coronary artery disease   . Bundle branch block, left   . Renal disorder   . PSA elevation     There are no active problems to display for this patient.   Past Surgical History  Procedure Laterality Date  . Rectal surgery      x3  . Appendectomy      No current outpatient prescriptions on file.  Allergies Review of patient's allergies indicates no known allergies.  History reviewed. No pertinent family history.  Social History Social History  Substance Use Topics  . Smoking status: Former Research scientist (life sciences)  . Smokeless tobacco: None  . Alcohol Use: Yes    Review of Systems  Constitutional: Negative for fever. Cardiovascular: Negative for chest pain. Respiratory: Positive for shortness of breath, cough. Gastrointestinal: Negative for abdominal pain, vomiting and diarrhea. Neurological: Negative for headaches, focal weakness or numbness.  10-point  ROS otherwise negative.  ____________________________________________   PHYSICAL EXAM:  VITAL SIGNS: ED Triage Vitals  Enc Vitals Group     BP 01/10/16 1339 174/70 mmHg     Pulse Rate 01/10/16 1339 106     Resp 01/10/16 1339 22     Temp 01/10/16 1339 98.5 F (36.9 C)     Temp Source 01/10/16 1339 Oral     SpO2 01/10/16 1339 92 %     Weight 01/10/16 1339 176 lb (79.833 kg)     Height 01/10/16 1339 5\' 11"  (1.803 m)   Constitutional: Alert and oriented. Well appearing and in no distress. Eyes: Conjunctivae are normal. PERRL. Normal extraocular movements. ENT   Head: Normocephalic and atraumatic.   Nose: No congestion/rhinnorhea.   Mouth/Throat: Mucous membranes are moist.   Neck: No stridor. Hematological/Lymphatic/Immunilogical: No cervical lymphadenopathy. Cardiovascular: Normal rate, regular rhythm.  No murmurs, rubs, or gallops. Respiratory: Normal respiratory effort without tachypnea nor retractions. Breath sounds are clear and equal bilaterally. No wheezes/rales/rhonchi. Gastrointestinal: Soft and nontender. No distention.  Genitourinary: Deferred Musculoskeletal: Normal range of motion in all extremities. No joint effusions.  No lower extremity tenderness nor edema. Neurologic:  Normal speech and language. No gross focal neurologic deficits are appreciated.  Skin:  Skin is warm, dry and intact. No rash noted. Psychiatric: Mood and affect are normal. Speech and behavior are normal. Patient exhibits appropriate insight and judgment.  ____________________________________________    LABS (pertinent positives/negatives)  Labs Reviewed  CBC WITH DIFFERENTIAL/PLATELET - Abnormal; Notable for the following:    WBC 13.7 (*)  RBC 3.12 (*)    Hemoglobin 9.8 (*)    HCT 29.3 (*)    Neutro Abs 11.6 (*)    Lymphs Abs 0.7 (*)    Monocytes Absolute 1.4 (*)    All other components within normal limits  BASIC METABOLIC PANEL - Abnormal; Notable for the following:     BUN 28 (*)    Creatinine, Ser 1.65 (*)    GFR calc non Af Amer 36 (*)    GFR calc Af Amer 41 (*)    All other components within normal limits  CULTURE, BLOOD (ROUTINE X 2)  CULTURE, BLOOD (ROUTINE X 2)  LACTIC ACID, PLASMA  LACTIC ACID, PLASMA     ____________________________________________   EKG  None  ____________________________________________    RADIOLOGY  CT head  IMPRESSION: Chronic atrophic and ischemic changes without acute abnormality.  CXR IMPRESSION: Airspace disease along the medial left lower lobe. Findings are suggestive for pneumonia.  Followup PA and lateral chest X-ray is recommended in 3-4 weeks following trial of antibiotic therapy to ensure resolution and exclude underlying malignancy.  ____________________________________________   PROCEDURES  Procedure(s) performed: None  Critical Care performed: No  ____________________________________________   INITIAL IMPRESSION / ASSESSMENT AND PLAN / ED COURSE  Pertinent labs & imaging results that were available during my care of the patient were reviewed by me and considered in my medical decision making (see chart for details).  Patient presented to the emergency department today because of concern for continued cough, shortness of breath and weakness. Recently was diagnosed and treated for influenza. CXR today concerning for pneumonia. WBC count is elevated. Will give IV antibiotics and admit to hospitalist service.   ____________________________________________   FINAL CLINICAL IMPRESSION(S) / ED DIAGNOSES  Final diagnoses:  Community acquired pneumonia     Nance Pear, MD 01/10/16 [phone]

## 2016-01-10 NOTE — ED Notes (Signed)
Pt presents to ED with prolonged symptoms of weakness and cough related to previous dx of influenza b 12/31/2015. Per EMS axillary temp 100. Pt is alert and oriented.

## 2016-01-11 LAB — BASIC METABOLIC PANEL
ANION GAP: 3 — AB (ref 5–15)
BUN: 28 mg/dL — ABNORMAL HIGH (ref 6–20)
CALCIUM: 8.5 mg/dL — AB (ref 8.9–10.3)
CHLORIDE: 111 mmol/L (ref 101–111)
CO2: 22 mmol/L (ref 22–32)
Creatinine, Ser: 1.35 mg/dL — ABNORMAL HIGH (ref 0.61–1.24)
GFR calc non Af Amer: 46 mL/min — ABNORMAL LOW (ref >60)
GFR, EST AFRICAN AMERICAN: 53 mL/min — AB (ref >60)
Glucose, Bld: 124 mg/dL — ABNORMAL HIGH (ref 65–99)
Potassium: 4.4 mmol/L (ref 3.5–5.1)
SODIUM: 136 mmol/L (ref 135–145)

## 2016-01-11 LAB — CBC
HCT: 25.4 % — ABNORMAL LOW (ref 40.0–52.0)
HEMOGLOBIN: 8.5 g/dL — AB (ref 13.0–18.0)
MCH: 31.6 pg (ref 26.0–34.0)
MCHC: 33.5 g/dL (ref 32.0–36.0)
MCV: 94.4 fL (ref 80.0–100.0)
PLATELETS: 180 10*3/uL (ref 150–440)
RBC: 2.68 MIL/uL — AB (ref 4.40–5.90)
RDW: 14 % (ref 11.5–14.5)
WBC: 12.2 10*3/uL — AB (ref 3.8–10.6)

## 2016-01-11 MED ORDER — ENOXAPARIN SODIUM 40 MG/0.4ML ~~LOC~~ SOLN
40.0000 mg | SUBCUTANEOUS | Status: DC
  Administered 2016-01-11 – 2016-01-16 (×6): 40 mg via SUBCUTANEOUS
  Filled 2016-01-11 (×6): qty 0.4

## 2016-01-11 MED ORDER — GUAIFENESIN-CODEINE 100-10 MG/5ML PO SOLN
5.0000 mL | ORAL | Status: DC | PRN
  Administered 2016-01-11 – 2016-01-13 (×7): 5 mL via ORAL
  Filled 2016-01-11 (×7): qty 5

## 2016-01-11 MED ORDER — LEVOFLOXACIN 750 MG PO TABS
750.0000 mg | ORAL_TABLET | ORAL | Status: DC
  Administered 2016-01-12: 750 mg via ORAL
  Filled 2016-01-11: qty 1

## 2016-01-11 MED ORDER — TRAMADOL HCL 50 MG PO TABS
50.0000 mg | ORAL_TABLET | Freq: Two times a day (BID) | ORAL | Status: DC
  Administered 2016-01-11 – 2016-01-12 (×2): 50 mg via ORAL
  Filled 2016-01-11 (×2): qty 1

## 2016-01-11 NOTE — Care Management Note (Signed)
Case Management Note  Patient Details  Name: Normal Gress. MRN: [id number] Date of Birth: 06/07/1927  Subjective/Objective:                 Patient admitted from home with PNA superimposed after recovering from the flu.  Patient lives at home alone, after recently losing his wife to complications from complications with the flu.  Patient has 2 adult daughters that live locally for support.  Patient obtains his medications from [facility] drug.  Patient states that at baseline he is independent of ADL's and still drives.  Patient is currently on acute Oxygen.  PT is currently recommending SNF.  CSW aware.    Action/Plan: Will need qualifying O2 sats if need at time of discharge and patient returns home.  RNCM following for disposition and home health needs.  [facility].  I have contacted [facility] and spoke with Margarita Grizzle and faxed appropriate documentation.  Per Dr. Tressia Miners anticipated discharge within 48 hours.   Expected Discharge Date:                  Expected Discharge Plan:     In-House Referral:     Discharge planning Services     Post Acute Care Choice:    Choice offered to:     DME Arranged:    DME Agency:     HH Arranged:    [place] Agency:     Status of Service:     Medicare Important Message Given:    Date Medicare IM Given:    Medicare IM give by:    Date Additional Medicare IM Given:    Additional Medicare Important Message give by:     If discussed at [place] of Stay Meetings, dates discussed:    Additional Comments:  Beverly Sessions, RN 01/11/2016, 3:03 PM

## 2016-01-11 NOTE — Consult Note (Addendum)
WOC wound consult note Reason for Consult: Consult requested for left arm skin tears.  Pt fell prior to admission. Wound type: Several areas of partial thickness skin loss to anterior and posterior elbow area Measurement: Posterior elbow  .1X4X.1cm, C-shaped, 5% pink and dry, skin approximated over 95% of wound Anterior elbow area 1X1X.1cm, 5% pink skin approximated over wound, 95% red and dry Anterior elbow area 3X4X.1cm, 10% pink skin approximated over wound, 90% red and dry Dressing procedure/placement/frequency: Xeroform gauze to promote moist healing and decrease adherence and discomfort with dressing changes. Orders provided for bedside nurses to perform Q day. Discussed plan of care with patient and family member, they deny further questions..   Please re-consult if further assistance is needed.  Thank-you,  Julien Girt MSN, [place], [place], [place], [place]

## 2016-01-11 NOTE — Progress Notes (Signed)
[place] [facility] [place] NAME: Prayag Stueck    MR#:  [id number]  DATE OF BIRTH:  [birth year]  SUBJECTIVE:  CHIEF COMPLAINT:   Chief Complaint  Patient presents with  . Influenza   - recently recovered from influenza illness. Complains of cough and wheezing. -Has complained about low sodium diet. Requesting a regular diet. Also wanted codeine-containing cough syrup because he was taking that as outpatient.  REVIEW OF SYSTEMS:  Review of Systems  Constitutional: Positive for malaise/fatigue. Negative for fever and chills.  HENT: Positive for hearing loss. Negative for ear discharge, ear pain, nosebleeds, sore throat and tinnitus.   Eyes: Negative for blurred vision.  Respiratory: Positive for cough, shortness of breath and wheezing.   Cardiovascular: Negative for chest pain, palpitations and leg swelling.  Gastrointestinal: Negative for nausea, vomiting, abdominal pain, diarrhea and constipation.  Genitourinary: Negative for dysuria.  Musculoskeletal: Positive for back pain. Negative for myalgias.  Neurological: Negative for dizziness, sensory change, speech change, focal weakness, seizures and headaches.  Psychiatric/Behavioral: Negative for depression.    DRUG ALLERGIES:   Allergies  Allergen Reactions  . Sulfa Antibiotics Other (See Comments)    Not sure of reaction. It was too long ago, but he said not to give it to him.     VITALS:  Blood pressure 130/49, pulse 99, temperature 97.9 F (36.6 C), temperature source Oral, resp. rate 20, height 5\' 11"  (1.803 m), weight 82.918 kg (182 lb 12.8 oz), SpO2 94 %.  PHYSICAL EXAMINATION:  Physical Exam  GENERAL:  82 y.o.-year-old patient lying in the bed with no acute distress.  EYES: Pupils equal, round, reactive to light and accommodation. No scleral icterus. Extraocular muscles intact.  HEENT: Head atraumatic, normocephalic. Oropharynx and nasopharynx clear.  NECK:  Supple, no  jugular venous distention. No thyroid enlargement, no tenderness.  LUNGS: scattered wheezing posteriorly at the bases noted.Rhonchorous breath sounds all over the lung fields. No crackles or rales. No use of accessory muscles of respiration.  CARDIOVASCULAR: S1, S2 normal. No rubs, or gallops. 3/6 systolic murmur present. ABDOMEN: Soft, nontender, nondistended. Bowel sounds present. No organomegaly or mass.  EXTREMITIES: No pedal edema, cyanosis, or clubbing.  NEUROLOGIC: Cranial nerves II through XII are intact. Muscle strength 5/5 in all extremities. Sensation intact. Gait not checked.  PSYCHIATRIC: The patient is alert and oriented x 3.  SKIN: No obvious rash, lesion, or ulcer.skin tear noted on his left arm.    LABORATORY PANEL:   CBC  Recent Labs Lab 01/11/16 0608  WBC 12.2*  HGB 8.5*  HCT 25.4*  PLT 180   ------------------------------------------------------------------------------------------------------------------  Chemistries   Recent Labs Lab 01/11/16 0608  NA 136  K 4.4  CL 111  CO2 22  GLUCOSE 124*  BUN 28*  CREATININE 1.35*  CALCIUM 8.5*   ------------------------------------------------------------------------------------------------------------------  Cardiac Enzymes No results for input(s): TROPONINI in the last 168 hours. ------------------------------------------------------------------------------------------------------------------  RADIOLOGY:  Dg Chest 2 View  01/10/2016  CLINICAL DATA:  Prolonged symptoms of weakness and cough related to influenza B. EXAM: CHEST  2 VIEW COMPARISON:  04/26/2014 and 02/21/2014 FINDINGS: Again noted are bilateral pleural calcifications. Focal airspace disease along the medial left lower lobe. Upper lungs are clear. Heart and mediastinum are stable and within normal limits. Atherosclerotic calcifications in the thoracic aorta. No acute bone abnormalities. IMPRESSION: Airspace disease along the medial left lower lobe.  Findings are suggestive for pneumonia. Followup PA and lateral chest X-ray is recommended in 3-4 weeks  following trial of antibiotic therapy to ensure resolution and exclude underlying malignancy. Electronically Signed   By: Markus Daft M.D.   On: 01/10/2016 14:23   Ct Head Wo Contrast  01/10/2016  CLINICAL DATA:  Recent trauma with facial pain, initial encounter EXAM: CT HEAD WITHOUT CONTRAST TECHNIQUE: Contiguous axial images were obtained from the base of the skull through the vertex without intravenous contrast. COMPARISON:  04/06/05 FINDINGS: Mild atrophic changes are seen. Scattered deep white matter hypodensity is noted consistent with chronic white matter ischemic change. No findings to suggest acute hemorrhage, acute infarction or space-occupying mass lesion are noted. The bony calvarium is intact. IMPRESSION: Chronic atrophic and ischemic changes without acute abnormality. Electronically Signed   By: Inez Catalina M.D.   On: 01/10/2016 16:41    EKG:   Orders placed or performed in visit on 09/29/12  . EKG 12-Lead    ASSESSMENT AND PLAN:   82 year old male with past medical history of COPD/asbestosis, BPH, essential hypertension, coronary artery disease who presents to the hospital after generalized weakness and malaise and a recent fall and noted to have pneumonia.  1. Pneumonia - post influenza pneumonia -chest x-ray with left lower lobe. Finished Tamiflu as outpatient. -Blood cultures are pending. -continue Levaquin - duonebs prn for his scattered wheezing. - cough meds added  2. Generalized weakness/malaise/falls-secondary to pneumonia, recent flu.  -Physical therapy consulted and patient might need to go to rehabilitation.  3. Essential hypertension-continue lisinopril.  4. Skin tears- painful, wound consult. From the fall  5. Neuropathy-continue gabapentin.  6. GERD-continue Protonix.  7. DVT Prophylaxis- on lovenox     All the records are reviewed and case discussed  with Care Management/Social Workerr. Management plans discussed with the patient, family and they are in agreement.  CODE STATUS: Full Code  TOTAL TIME TAKING CARE OF THIS PATIENT: 38 minutes.   POSSIBLE D/C IN 2 DAYS, DEPENDING ON CLINICAL CONDITION.   Gladstone Lighter M.D on 01/11/2016 at 2:34 PM  Between 7am to 6pm - Pager - [phone]  After 6pm go to [email or web address] - password [email or web address] [place] [facility]  Office  [phone]  CC: Primary care physician; BABAOFF, Caryl Bis, MD

## 2016-01-11 NOTE — Evaluation (Signed)
Physical Therapy Evaluation Patient Details Name: Lonnie [name]. MRN: [id number] DOB: 07/30/1927 Today's Date: 01/11/2016   History of Present Illness  Pt is a 82 y.o. male with PMH of COPD, asbestos exposure, HTN, CAD, and neuropathy.  Pt presented with generalized weakness and a fall.  Pt was admitted for pneumonia.      Clinical Impression  Prior to admission pt was independent with SPC.  However pt reported that the majority of the time he did not use an AD and recently had a fall.  Pt lives alone.  Pt was CGA for rolling and min assist with sidelying to sit.  Pt was min assist for sit to stand with RW and CGA for ambulation with RW for 3 feet.  Pt reported that he felt very weak and was tired after ambulation.  Pt's vitals during session were: O2 saturation:  94% O2 beginning of session, and 92% O2 after ambulation (all on 2 L/min O2 via nasal cannula), HR: 76 bpm beginning of session, 99 bpm after ambulation.  Due to aforementioned function and strength deficits, pt is in need of skilled physical therapy.  It is recommended that pt is discharged to SNF (pending progress) when medically appropriate.     Follow Up Recommendations SNF (pending progress )    Equipment Recommendations  Rolling walker with 5" wheels    Recommendations for Other Services       Precautions / Restrictions Precautions Precautions: Fall Restrictions Weight Bearing Restrictions: No      Mobility  Bed Mobility Overal bed mobility: Needs Assistance Bed Mobility: Rolling;Sidelying to Sit Rolling: Min guard Sidelying to sit: Min assist       General bed mobility comments: increased time, assist with trunk navigation    Transfers Overall transfer level: Needs assistance Equipment used: Rolling walker (2 wheeled) Transfers: Sit to/from Stand Sit to Stand: Min assist         General transfer comment: increased time, VC's for feet placement   Ambulation/Gait Ambulation/Gait assistance: Min  guard Ambulation Distance (Feet): 3 Feet Assistive device: Rolling walker (2 wheeled) Gait Pattern/deviations: Step-to pattern;Decreased step length - left;Decreased stance time - right;Trunk flexed Gait velocity: decreased       Stairs            Wheelchair Mobility    Modified Rankin (Stroke Patients Only)       Balance Overall balance assessment: Needs assistance Sitting-balance support: Feet supported Sitting balance-Leahy Scale: Good     Standing balance support: Bilateral upper extremity supported (RW ) Standing balance-Leahy Scale: Fair                               Pertinent Vitals/Pain Pain Assessment: No/denies pain  See flow sheet for vitals.     Home Living Family/patient expects to be discharged to:: Private residence Living Arrangements: Alone Available Help at Discharge: Family   Home Access: Stairs to enter Entrance Stairs-Rails: Left Entrance Stairs-Number of Steps: 2 Home Layout: One level Home Equipment: Cane - single point      Prior Function Level of Independence: Independent with assistive device(s) (SPC )         Comments: Pt reported that he ocassionally uses a SPC; but mostly does not use an AD.  Pt recently had a fall      Hand Dominance        Extremity/Trunk Assessment   Upper Extremity Assessment: Overall WFL for tasks  assessed           Lower Extremity Assessment: Generalized weakness      Cervical / Trunk Assessment: Normal  Communication   Communication: No difficulties  Cognition Arousal/Alertness: Awake/alert Behavior During Therapy: WFL for tasks assessed/performed Overall Cognitive Status: Within Functional Limits for tasks assessed                      General Comments   Nursing was contacted and cleared pt for physical therapy.  Pt was agreeable and session was modified due to fatigue.     Exercises        Assessment/Plan    PT Assessment Patient needs continued PT  services  PT Diagnosis Difficulty walking   PT Problem List Decreased strength;Decreased activity tolerance;Decreased mobility;Decreased balance  PT Treatment Interventions DME instruction;Gait training;Stair training;Functional mobility training;Therapeutic activities;Therapeutic exercise;Patient/family education   PT Goals (Current goals can be found in the Care Plan section) Acute Rehab PT Goals Patient Stated Goal: to go home  PT Goal Formulation: With patient Time For Goal Achievement: 01/25/16 Potential to Achieve Goals: Fair    Frequency Min 2X/week   Barriers to discharge        Co-evaluation               End of Session Equipment Utilized During Treatment: Gait belt;Oxygen (2 L/min O2 via nasal cannula ) Activity Tolerance: Patient limited by fatigue Patient left: in chair;with call bell/phone within reach;with chair alarm set Nurse Communication: Mobility status;Precautions         Time: [id number] PT Time Calculation (min) (ACUTE ONLY): 24 min   Charges:         PT G Codes:       Mittie Bodo, SPT  Mittie Bodo 01/11/2016, 11:53 AM

## 2016-01-12 ENCOUNTER — Inpatient Hospital Stay: Payer: Medicare Other

## 2016-01-12 LAB — BASIC METABOLIC PANEL
ANION GAP: 2 — AB (ref 5–15)
BUN: 30 mg/dL — ABNORMAL HIGH (ref 6–20)
CHLORIDE: 111 mmol/L (ref 101–111)
CO2: 24 mmol/L (ref 22–32)
Calcium: 8.4 mg/dL — ABNORMAL LOW (ref 8.9–10.3)
Creatinine, Ser: 1.46 mg/dL — ABNORMAL HIGH (ref 0.61–1.24)
GFR calc non Af Amer: 41 mL/min — ABNORMAL LOW (ref >60)
GFR, EST AFRICAN AMERICAN: 48 mL/min — AB (ref >60)
Glucose, Bld: 104 mg/dL — ABNORMAL HIGH (ref 65–99)
POTASSIUM: 4.2 mmol/L (ref 3.5–5.1)
Sodium: 137 mmol/L (ref 135–145)

## 2016-01-12 LAB — BLOOD GAS, ARTERIAL
Acid-base deficit: 0.1 mmol/L (ref 0.0–2.0)
Allens test (pass/fail): POSITIVE — AB
BICARBONATE: 23.6 meq/L (ref 21.0–28.0)
FIO2: 28
O2 Saturation: 86.2 %
PATIENT TEMPERATURE: 37
pCO2 arterial: 34 mmHg (ref 32.0–48.0)
pH, Arterial: 7.45 (ref 7.350–7.450)
pO2, Arterial: 49 mmHg — ABNORMAL LOW (ref 83.0–108.0)

## 2016-01-12 LAB — CBC
HEMATOCRIT: 26.6 % — AB (ref 40.0–52.0)
HEMOGLOBIN: 9 g/dL — AB (ref 13.0–18.0)
MCH: 32 pg (ref 26.0–34.0)
MCHC: 33.9 g/dL (ref 32.0–36.0)
MCV: 94.3 fL (ref 80.0–100.0)
Platelets: 196 10*3/uL (ref 150–440)
RBC: 2.82 MIL/uL — ABNORMAL LOW (ref 4.40–5.90)
RDW: 13.6 % (ref 11.5–14.5)
WBC: 15.2 10*3/uL — ABNORMAL HIGH (ref 3.8–10.6)

## 2016-01-12 LAB — AMMONIA: AMMONIA: 18 umol/L (ref 9–35)

## 2016-01-12 MED ORDER — MOMETASONE FURO-FORMOTEROL FUM 200-5 MCG/ACT IN AERO
2.0000 | INHALATION_SPRAY | Freq: Two times a day (BID) | RESPIRATORY_TRACT | Status: DC
  Administered 2016-01-12 – 2016-01-18 (×12): 2 via RESPIRATORY_TRACT
  Filled 2016-01-12: qty 8.8

## 2016-01-12 MED ORDER — VANCOMYCIN HCL 10 G IV SOLR
1250.0000 mg | INTRAVENOUS | Status: DC
  Administered 2016-01-12 – 2016-01-13 (×2): 1250 mg via INTRAVENOUS
  Filled 2016-01-12 (×4): qty 1250

## 2016-01-12 MED ORDER — VANCOMYCIN HCL IN DEXTROSE 1-5 GM/200ML-% IV SOLN
1000.0000 mg | Freq: Once | INTRAVENOUS | Status: AC
  Administered 2016-01-12: 1000 mg via INTRAVENOUS
  Filled 2016-01-12: qty 200

## 2016-01-12 MED ORDER — PIPERACILLIN-TAZOBACTAM 3.375 G IVPB
3.3750 g | Freq: Three times a day (TID) | INTRAVENOUS | Status: DC
  Administered 2016-01-12 – 2016-01-18 (×18): 3.375 g via INTRAVENOUS
  Filled 2016-01-12 (×20): qty 50

## 2016-01-12 MED ORDER — BISACODYL 5 MG PO TBEC
10.0000 mg | DELAYED_RELEASE_TABLET | Freq: Every day | ORAL | Status: DC | PRN
Start: 2016-01-12 — End: 2016-01-18
  Administered 2016-01-13: 10 mg via ORAL
  Filled 2016-01-12: qty 2

## 2016-01-12 MED ORDER — TRAMADOL HCL 50 MG PO TABS
50.0000 mg | ORAL_TABLET | Freq: Two times a day (BID) | ORAL | Status: DC | PRN
  Administered 2016-01-13 – 2016-01-15 (×3): 50 mg via ORAL
  Filled 2016-01-12 (×3): qty 1

## 2016-01-12 MED ORDER — SENNOSIDES-DOCUSATE SODIUM 8.6-50 MG PO TABS
2.0000 | ORAL_TABLET | Freq: Two times a day (BID) | ORAL | Status: DC
  Administered 2016-01-12 – 2016-01-18 (×13): 2 via ORAL
  Filled 2016-01-12 (×13): qty 2

## 2016-01-12 MED ORDER — IPRATROPIUM-ALBUTEROL 0.5-2.5 (3) MG/3ML IN SOLN
3.0000 mL | Freq: Four times a day (QID) | RESPIRATORY_TRACT | Status: DC
  Administered 2016-01-12 – 2016-01-18 (×23): 3 mL via RESPIRATORY_TRACT
  Filled 2016-01-12 (×23): qty 3

## 2016-01-12 NOTE — Consult Note (Signed)
Pharmacy Antibiotic Note  Lonnie [name]. is a 82 y.o. male admitted on 01/10/2016 with pneumonia.  Pharmacy has been consulted for zosyn and vancomycin dosing.  Plan: Vancomycin 1g once, then 6 hours later start 1250 q 24 for stacked dosing. Vancomycin 1250 IV every 24 hours.  Goal trough 15-20 mcg/mL. Zosyn 3.375g IV q8h (4 hour infusion). Trough at steady state 4/14 @ 1830  Height: 5\' 11"  (180.3 cm) Weight: 182 lb 12.8 oz (82.918 kg) IBW/kg (Calculated) : 75.3  Temp (24hrs), Avg:98.8 F (37.1 C), Min:98.1 F (36.7 C), Max:100.5 F (38.1 C)   Recent Labs Lab 01/10/16 1416 01/10/16 1642 01/11/16 0608 01/12/16 0410  WBC 13.7*  --  12.2* 15.2*  CREATININE 1.65*  --  1.35* 1.46*  LATICACIDVEN 1.6 1.4  --   --     Estimated Creatinine Clearance: 38 mL/min (by C-G formula based on Cr of 1.46).    Allergies  Allergen Reactions  . Sulfa Antibiotics Other (See Comments)    Not sure of reaction. It was too long ago, but he said not to give it to him.     Antimicrobials this admission: levofloxacin 4/9 >> 4/11 vancomycin 4/11 >>  Zosyn 4/11>>  Dose adjustments this admission:   Microbiology results: 4/9 BCx: neg <24hr  4/9 MRSA PCR: neg  Thank you for allowing pharmacy to be a part of this patient's care.  Ramond Dial, Pharm.D Clinical Pharmacist 01/12/2016 12:18 PM

## 2016-01-12 NOTE — Progress Notes (Signed)
Pt is alert and oriented, no complaints of pain, pt has slept majority of shift.  Pt voids in urinal.  VSS.

## 2016-01-12 NOTE — Progress Notes (Signed)
[facility] [place] NAME: Lonnie [name]    MR#:  [id number]  DATE OF BIRTH:  Jul 22, 1927  SUBJECTIVE:  CHIEF COMPLAINT:   Chief Complaint  Patient presents with  . Influenza   - Complains of constipation. More sleepy today. Daughter at bedside now. -Hypoxic on 2 L, increased to 4 L oxygen. -Continues to have productive cough  REVIEW OF SYSTEMS:  Review of Systems  Constitutional: Positive for malaise/fatigue. Negative for fever and chills.  HENT: Positive for hearing loss. Negative for ear discharge, ear pain, nosebleeds, sore throat and tinnitus.   Eyes: Negative for blurred vision.  Respiratory: Positive for cough, shortness of breath and wheezing.   Cardiovascular: Negative for chest pain, palpitations and leg swelling.  Gastrointestinal: Positive for constipation. Negative for nausea, vomiting, abdominal pain and diarrhea.  Genitourinary: Negative for dysuria.  Musculoskeletal: Positive for back pain. Negative for myalgias.  Neurological: Negative for dizziness, sensory change, speech change, focal weakness, seizures and headaches.  Psychiatric/Behavioral: Negative for depression.    DRUG ALLERGIES:   Allergies  Allergen Reactions  . Sulfa Antibiotics Other (See Comments)    Not sure of reaction. It was too long ago, but he said not to give it to him.     VITALS:  Blood pressure 137/41, pulse 87, temperature 98.5 F (36.9 C), temperature source Oral, resp. rate 20, height 5\' 11"  (1.803 m), weight 82.918 kg (182 lb 12.8 oz), SpO2 95 %.  PHYSICAL EXAMINATION:  Physical Exam  GENERAL:  82 y.o.-year-old patient lying in the bed with no acute distress. More sleepy today. EYES: Pupils equal, round, reactive to light and accommodation. No scleral icterus. Extraocular muscles intact.  HEENT: Head atraumatic, normocephalic. Oropharynx and nasopharynx clear.  NECK:  Supple, no jugular venous distention. No thyroid enlargement,  no tenderness.  LUNGS: scattered wheezing posteriorly at the bases noted.Rhonchorous breath sounds all over the lung fields. No crackles or rales. No use of accessory muscles of respiration.  CARDIOVASCULAR: S1, S2 normal. No rubs, or gallops. 3/6 systolic murmur present. ABDOMEN: Soft, nontender, nondistended. Bowel sounds present. No organomegaly or mass.  EXTREMITIES: No pedal edema, cyanosis, or clubbing.  NEUROLOGIC: Cranial nerves II through XII are intact. Muscle strength 5/5 in all extremities. Sensation intact. Gait not checked.  PSYCHIATRIC: The patient is sleepy, but arousable and oriented 3 then SKIN: No obvious rash, lesion, or ulcer.skin tear noted on his left arm.    LABORATORY PANEL:   CBC  Recent Labs Lab 01/12/16 0410  WBC 15.2*  HGB 9.0*  HCT 26.6*  PLT 196   ------------------------------------------------------------------------------------------------------------------  Chemistries   Recent Labs Lab 01/12/16 0410  NA 137  K 4.2  CL 111  CO2 24  GLUCOSE 104*  BUN 30*  CREATININE 1.46*  CALCIUM 8.4*   ------------------------------------------------------------------------------------------------------------------  Cardiac Enzymes No results for input(s): TROPONINI in the last 168 hours. ------------------------------------------------------------------------------------------------------------------  RADIOLOGY:  Dg Chest 2 View  01/10/2016  CLINICAL DATA:  Prolonged symptoms of weakness and cough related to influenza B. EXAM: CHEST  2 VIEW COMPARISON:  04/26/2014 and 02/21/2014 FINDINGS: Again noted are bilateral pleural calcifications. Focal airspace disease along the medial left lower lobe. Upper lungs are clear. Heart and mediastinum are stable and within normal limits. Atherosclerotic calcifications in the thoracic aorta. No acute bone abnormalities. IMPRESSION: Airspace disease along the medial left lower lobe. Findings are suggestive for  pneumonia. Followup PA and lateral chest X-ray is recommended in 3-4 weeks following trial of  antibiotic therapy to ensure resolution and exclude underlying malignancy. Electronically Signed   By: Markus Daft M.D.   On: 01/10/2016 14:23   Ct Head Wo Contrast  01/10/2016  CLINICAL DATA:  Recent trauma with facial pain, initial encounter EXAM: CT HEAD WITHOUT CONTRAST TECHNIQUE: Contiguous axial images were obtained from the base of the skull through the vertex without intravenous contrast. COMPARISON:  04/06/05 FINDINGS: Mild atrophic changes are seen. Scattered deep white matter hypodensity is noted consistent with chronic white matter ischemic change. No findings to suggest acute hemorrhage, acute infarction or space-occupying mass lesion are noted. The bony calvarium is intact. IMPRESSION: Chronic atrophic and ischemic changes without acute abnormality. Electronically Signed   By: Inez Catalina M.D.   On: 01/10/2016 16:41   Dg Chest Port 1 View  01/12/2016  CLINICAL DATA:  COPD high blood pressure. Hospitalization for generalized weakness and fall. Patient is noted to have a pneumonia. EXAM: PORTABLE CHEST 1 VIEW COMPARISON:  January 10, 2016 FINDINGS: The heart size and mediastinal contours are stable. There is patchy consolidation of bilateral lung bases. Chronic calcified pleural plaques are identified in the left lung base unchanged. The visualized skeletal structures are stable. IMPRESSION: Patchy consolidation of bilateral lung bases, pneumonia not excluded. Electronically Signed   By: Abelardo Diesel M.D.   On: 01/12/2016 13:29    EKG:   Orders placed or performed in visit on 09/29/12  . EKG 12-Lead    ASSESSMENT AND PLAN:   82 year old male with past medical history of COPD/asbestosis, BPH, essential hypertension, coronary artery disease who presents to the hospital after generalized weakness and malaise and a recent fall and noted to have pneumonia.  1. Acute hypoxic respiratory failure-secondary  to post influenza pneumonia -ABG showing hypoxia. Increased oxygen to 4 L -chest x-ray with left lower lobe. Finished Tamiflu as outpatient. Failed outpatient Levaquin -Blood cultures are pending. -Since worsening clinically, repeat chest x-ray today, change antibiotics to vancomycin and Zosyn. Check MRSA PCR - duonebs for his scattered wheezing. Also flutter valve or chest percussion therapy for his cough. -Pulmonary consulted - cough meds added  2. Generalized weakness/malaise/falls-secondary to pneumonia, recent flu.  -Physical therapy consulted and patient might need to go to rehabilitation.  3. Essential hypertension-continue lisinopril.  4. Skin tears- painful, wound consult. From the fall  5. Neuropathy-continue gabapentin. Hold gabapentin as patient is very sleepy.  6. GERD-continue Protonix.  7. DVT Prophylaxis- on lovenox  8. AMS- meds related vs from his infection - hold gabapentin, change tramadol to PRN. Avoid narcotics or benzo's and monitor  Physical Therapy     All the records are reviewed and case discussed with Care Management/Social Workerr. Management plans discussed with the patient, family and they are in agreement.  CODE STATUS: Full Code  TOTAL TIME TAKING CARE OF THIS PATIENT: 38 minutes.   POSSIBLE D/C IN 2 DAYS, DEPENDING ON CLINICAL CONDITION.   Gladstone Lighter M.D on 01/12/2016 at 1:54 PM  Between 7am to 6pm - Pager - [phone]  After 6pm go to [email or web address] - password [email or web address] [facility]  [facility]  Office  [phone]  CC: Primary care physician; No primary care provider on file.

## 2016-01-12 NOTE — Care Management Important Message (Signed)
Important Message  Patient Details  Name: Lonnie [name]. MRN: [id number] Date of Birth: 1927-09-06   Medicare Important Message Given:  Yes    [name] 01/12/2016, 1:19 PM

## 2016-01-12 NOTE — Care Management (Signed)
Per MD patient has decompensated.  MD does no longer feels that patient will discharge within 48 hours.  Patient has signed refusal to transfer to [place].  I have faxed the signed copy to [place] [facility], and placed signed copy in chart.  Patient is still requiring acute O2.  PT recommending SNF.  Patient would like to progress to home health.

## 2016-01-12 NOTE — Progress Notes (Signed)
Date: 01/12/2016,   MRN# [id number] Wardell Hasbargen. June 27, 1927 Code Status:     Code Status Orders        Start     Ordered   01/10/16 2015  Full code   Continuous     01/10/16 2016    Code Status History    Date Active Date Inactive Code Status Order ID Comments User Context   This patient has a current code status but no historical code status.    Advance Directive Documentation        Most Recent Value   Type of Advance Directive  Healthcare Power of Attorney, Living will   Pre-existing out of facility DNR order (yellow form or pink MOST form)     "MOST" Form in Place?       Hosp day:@LENGTHOFSTAYDAYS @ Referring MD: @ATDPROV @          AdmissionWeight: 176 lb (79.833 kg)                 CurrentWeight: 182 lb 12.8 oz (82.918 kg)   CC:   HPI: pt seen, sleeping, will return  PMHX:   Past Medical History  Diagnosis Date  . COPD (chronic obstructive pulmonary disease) ([place])   . Hypertension   . Coronary artery disease   . Bundle branch block, left   . Renal disorder   . PSA elevation    Surgical Hx:  Past Surgical History  Procedure Laterality Date  . Rectal surgery      x3  . Appendectomy     Family Hx:  Family History  Problem Relation Age of Onset  . Heart attack Mother   . Heart attack Father    Social Hx:   Social History  Substance Use Topics  . Smoking status: Former Smoker -- 2.00 packs/day for 50 years    Types: Cigarettes  . Smokeless tobacco: None  . Alcohol Use: 0.0 oz/week    0 Standard drinks or equivalent per week   Medication:    Home Medication:  No current outpatient prescriptions on file.  Current Medication: @CURMEDTAB @   Allergies:  Sulfa antibiotics  Review of Systems: Gen:  Denies  fever, sweats, chills HEENT: Denies blurred vision, double vision, ear pain, eye pain, hearing loss, nose bleeds, sore throat Cvc:  No dizziness, chest pain or heaviness Resp:    Gi: Denies swallowing difficulty, stomach pain, nausea or  vomiting, diarrhea, constipation, bowel incontinence Gu:  Denies bladder incontinence, burning urine Ext:   No Joint pain, stiffness or swelling Skin: No skin rash, easy bruising or bleeding or hives Endoc:  No polyuria, polydipsia , polyphagia or weight change Psych: No depression, insomnia or hallucinations  Other:  All other systems negative  Physical Examination:   VS: BP 137/41 mmHg  Pulse 87  Temp(Src) 98.5 F (36.9 C) (Oral)  Resp 20  Ht 5\' 11"  (1.803 m)  Wt 182 lb 12.8 oz (82.918 kg)  BMI 25.51 kg/m2  SpO2 95%  General Appearance: No distress  Neuro: without focal findings, mental status, speech normal, alert and oriented, cranial nerves 2-12 intact, reflexes normal and symmetric, sensation grossly normal  HEENT: PERRLA, EOM intact, no ptosis, no other lesions noticed, Mallampati: Pulmonary:.No wheezing, No rales  Sputum Production:   Cardiovascular:  Normal S1,S2.  No m/r/g.  Abdominal aorta pulsation normal.    Abdomen:Benign, Soft, non-tender, No masses, hepatosplenomegaly, No lymphadenopathy Endoc: No evident thyromegaly, no signs of acromegaly or Cushing features Skin:   warm, no rashes,  no ecchymosis  Extremities: normal, no cyanosis, clubbing, no edema, warm with normal capillary refill. Other findings:   Labs results:   Recent Labs     01/10/16  1416  01/11/16  0608  01/12/16  0410  HGB  9.8*  8.5*  9.0*  HCT  29.3*  25.4*  26.6*  MCV  94.0  94.4  94.3  WBC  13.7*  12.2*  15.2*  BUN  28*  28*  30*  CREATININE  1.65*  1.35*  1.46*  GLUCOSE  92  124*  104*  CALCIUM  8.9  8.5*  8.4*  ,    No results for input(s): PH in the last 72 hours.  Invalid input(s): PCO2, PO2, BASEEXCESS, BASEDEFICITE, TFT  Culture results:     Rad results:   Dg Chest Port 1 View  01/12/2016  CLINICAL DATA:  COPD high blood pressure. Hospitalization for generalized weakness and fall. Patient is noted to have a pneumonia. EXAM: PORTABLE CHEST 1 VIEW COMPARISON:  January 10, 2016 FINDINGS: The heart size and mediastinal contours are stable. There is patchy consolidation of bilateral lung bases. Chronic calcified pleural plaques are identified in the left lung base unchanged. The visualized skeletal structures are stable. IMPRESSION: Patchy consolidation of bilateral lung bases, pneumonia not excluded. Electronically Signed   By: Abelardo Diesel M.D.   On: 01/12/2016 13:29      EKG:     Other:   Assessment and Plan:    I have personally obtained a history, examined the patient, evaluated laboratory and imaging results, formulated the assessment and plan and placed orders.  The Patient requires high complexity decision making for assessment and support, frequent evaluation and titration of therapies, application of advanced monitoring technologies and extensive interpretation of multiple databases.   Oriyah Lamphear,M.D. Pulmonary & Critical care Medicine [facility]

## 2016-01-13 LAB — CBC
HCT: 25.6 % — ABNORMAL LOW (ref 40.0–52.0)
Hemoglobin: 8.6 g/dL — ABNORMAL LOW (ref 13.0–18.0)
MCH: 31.4 pg (ref 26.0–34.0)
MCHC: 33.8 g/dL (ref 32.0–36.0)
MCV: 92.9 fL (ref 80.0–100.0)
PLATELETS: 186 10*3/uL (ref 150–440)
RBC: 2.75 MIL/uL — ABNORMAL LOW (ref 4.40–5.90)
RDW: 13.5 % (ref 11.5–14.5)
WBC: 15.8 10*3/uL — ABNORMAL HIGH (ref 3.8–10.6)

## 2016-01-13 LAB — BASIC METABOLIC PANEL
Anion gap: 6 (ref 5–15)
BUN: 25 mg/dL — AB (ref 6–20)
CALCIUM: 8.3 mg/dL — AB (ref 8.9–10.3)
CO2: 20 mmol/L — ABNORMAL LOW (ref 22–32)
CREATININE: 1.37 mg/dL — AB (ref 0.61–1.24)
Chloride: 109 mmol/L (ref 101–111)
GFR calc Af Amer: 52 mL/min — ABNORMAL LOW (ref >60)
GFR, EST NON AFRICAN AMERICAN: 45 mL/min — AB (ref >60)
GLUCOSE: 102 mg/dL — AB (ref 65–99)
POTASSIUM: 4.1 mmol/L (ref 3.5–5.1)
Sodium: 135 mmol/L (ref 135–145)

## 2016-01-13 LAB — MRSA PCR SCREENING: MRSA by PCR: NEGATIVE

## 2016-01-13 MED ORDER — LACTULOSE 10 GM/15ML PO SOLN
20.0000 g | Freq: Two times a day (BID) | ORAL | Status: DC | PRN
  Administered 2016-01-13: 20 g via ORAL
  Filled 2016-01-13: qty 30

## 2016-01-13 NOTE — Consult Note (Signed)
Pharmacy Antibiotic Note  Lonnie [name]. is a 82 y.o. male admitted on 01/10/2016 with pneumonia.  Pharmacy has been consulted for zosyn and vancomycin dosing.  Plan: Vancomycin 1g once, then 6 hours later start 1250 q 24 for stacked dosing. Vancomycin 1250 IV every 24 hours.  Goal trough 15-20 mcg/mL. Zosyn 3.375g IV q8h (4 hour infusion). Trough at steady state 4/14 @ 1830  4/12: Patient decompensated (WBC/temp spike) and antibiotics broadened 4/11.  Scr 1.37.   Ke 0.038, t1/2 18.24  Vd 58. Patient borderline for dose adjustment. F/u Scr and MRSA PCR.  Height: 5\' 11"  (180.3 cm) Weight: 182 lb 12.8 oz (82.918 kg) IBW/kg (Calculated) : 75.3  Temp (24hrs), Avg:98.8 F (37.1 C), Min:97.5 F (36.4 C), Max:100.9 F (38.3 C)   Recent Labs Lab 01/10/16 1416 01/10/16 1642 01/11/16 0608 01/12/16 0410 01/13/16 0438  WBC 13.7*  --  12.2* 15.2* 15.8*  CREATININE 1.65*  --  1.35* 1.46* 1.37*  LATICACIDVEN 1.6 1.4  --   --   --     Estimated Creatinine Clearance: 40.5 mL/min (by C-G formula based on Cr of 1.37).    Allergies  Allergen Reactions  . Sulfa Antibiotics Other (See Comments)    Not sure of reaction. It was too long ago, but he said not to give it to him.     Antimicrobials this admission: levofloxacin 4/9 >> 4/11 vancomycin 4/11 >>  Zosyn 4/11>>  Dose adjustments this admission:   Microbiology results: 4/9 BCx: neg <24hr  4/12 MRSA PCR: pending  Thank you for allowing pharmacy to be a part of this patient's care.  Chinita Greenland PharmD Clinical Pharmacist 01/13/2016  3:22 PM

## 2016-01-13 NOTE — Progress Notes (Signed)
Date: 01/13/2016,   MRN# [id number] Kairyn Deskin. December 27, 1926 Code Status:     Code Status Orders        Start     Ordered   01/10/16 2015  Full code   Continuous     01/10/16 2016    Code Status History    Date Active Date Inactive Code Status Order ID Comments User Context   This patient has a current code status but no historical code status.    Advance Directive Documentation        Most Recent Value   Type of Advance Directive  Healthcare Power of Attorney, Living will   Pre-existing out of facility DNR order (yellow form or pink MOST form)     "MOST" Form in Place?       Hosp day:@LENGTHOFSTAYDAYS @ Referring MD: @ATDPROV @            HPI: sob, frial, cough, on 4 liters [place] 02, chest xray noted  PMHX:   Past Medical History  Diagnosis Date  . COPD (chronic obstructive pulmonary disease) ([place])   . Hypertension   . Coronary artery disease   . Bundle branch block, left   . Renal disorder   . PSA elevation    Surgical Hx:  Past Surgical History  Procedure Laterality Date  . Rectal surgery      x3  . Appendectomy     Family Hx:  Family History  Problem Relation Age of Onset  . Heart attack Mother   . Heart attack Father    Social Hx:   Social History  Substance Use Topics  . Smoking status: Former Smoker -- 2.00 packs/day for 50 years    Types: Cigarettes  . Smokeless tobacco: None  . Alcohol Use: 0.0 oz/week    0 Standard drinks or equivalent per week   Medication:    Home Medication:  No current outpatient prescriptions on file.  Current Medication: @CURMEDTAB @   Allergies:  Sulfa antibiotics  Review of Systems: Gen:  Denies  fever, sweats, chills HEENT: Denies blurred vision, double vision, ear pain, eye pain, hearing loss, nose bleeds, sore throat Cvc:  No dizziness, chest pain or heaviness Resp:  Cough, sob, weak  Gi: Denies swallowing difficulty, stomach pain, nausea or vomiting, diarrhea, constipation, bowel incontinence Gu:   Denies bladder incontinence, burning urine Ext:   No Joint pain, stiffness or swelling Skin: No skin rash, easy bruising or bleeding or hives Endoc:  No polyuria, polydipsia , polyphagia or weight change Psych: No depression, insomnia or hallucinations  Other:  All other systems negative  Physical Examination:   VS: BP 134/95 mmHg  Pulse 95  Temp(Src) 99 F (37.2 C) (Oral)  Resp 23  Ht 5\' 11"  (1.803 m)  Wt 182 lb 12.8 oz (82.918 kg)  BMI 25.51 kg/m2  SpO2 95%  General Appearance: No distress, frial on 4 liters [place] 02 Neuro: without focal findings, mental status, speech normal, alert and oriented, cranial nerves 2-12 intact, reflexes normal and symmetric, sensation grossly normal  HEENT: PERRLA, EOM intact, no ptosis, no other lesions noticed, Mallampati: Pulmonary:.No wheezing, No rales    Cardiovascular:  Normal S1,S2.  No m/r/g.  Abdominal aorta pulsation normal.    Abdomen:Benign, Soft, non-tender, No masses, hepatosplenomegaly, No lymphadenopathy Endoc: No evident thyromegaly, no signs of acromegaly or Cushing features Skin:   warm, no rashes, no ecchymosis  Extremities: normal, no cyanosis, clubbing, no edema, warm with normal capillary refill. Other findings:   Labs  results:   Recent Labs     01/11/16  0608  01/12/16  0410  01/13/16  0438  HGB  8.5*  9.0*  8.6*  HCT  25.4*  26.6*  25.6*  MCV  94.4  94.3  92.9  WBC  12.2*  15.2*  15.8*  BUN  28*  30*  25*  CREATININE  1.35*  1.46*  1.37*  GLUCOSE  124*  104*  102*  CALCIUM  8.5*  8.4*  8.3*  ,      Rad results:  CLINICAL DATA: COPD high blood pressure. Hospitalization for generalized weakness and fall. Patient is noted to have a pneumonia.  EXAM: PORTABLE CHEST 1 VIEW  COMPARISON: January 10, 2016  FINDINGS: The heart size and mediastinal contours are stable. There is patchy consolidation of bilateral lung bases. Chronic calcified pleural plaques are identified in the left lung base unchanged.  The visualized skeletal structures are stable.  IMPRESSION: Patchy consolidation of bilateral lung bases, pneumonia not excluded.   Electronically Signed  By: Abelardo Diesel M.D.  On: 01/12/2016 13:29   Assessment and Plan: Acute on chronic resp failure. Copd exacerbation, pneumonia, s/p Influenza virus infection 2 weeks or so ago. At risk for post viral bacterial pneumonia. Agree with the broad antbiotic coverage and the need to r/o MRSA.Marland Kitchen He is also an anxious male No further changes at this time following    I have personally obtained a history, examined the patient, evaluated laboratory and imaging results, formulated the assessment and plan and placed orders.  The Patient requires high complexity decision making for assessment and support, frequent evaluation and titration of therapies, application of advanced monitoring technologies and extensive interpretation of multiple databases.   Rhys Lichty,M.D. Pulmonary & Critical care Medicine [facility]

## 2016-01-13 NOTE — Progress Notes (Signed)
Physical Therapy Treatment Patient Details Name: Lonnie [name]. MRN: [id number] DOB: 1927/08/11 Today's Date: 01/13/2016    History of Present Illness Pt is a 82 y.o. male with PMH of COPD, asbestos exposure, HTN, CAD, and neuropathy.  Pt presented with generalized weakness and a fall.  Pt was admitted for pneumonia.  Pt was intially placed on 2 L/min O2 and has recently been placed on 4 L/min O2 (both via nasal cannula)    PT Comments    Pt reported at the beginning of the session that he was very tired.   Nursing reported that recently pt was transferred from bed to chair.  Pt was mod assist for 2 sets of sit to stand with RW.  Pt could not stand fully erect.  Pt performed sitting in chair LE AROM exercises.  Pt appeared to be weaker and could not stand fully erect compared to last session.  Next session PT will attempt to ambulate pt per pt fatigue.   Follow Up Recommendations  SNF     Equipment Recommendations  Rolling walker with 5" wheels    Recommendations for Other Services       Precautions / Restrictions Precautions Precautions: Fall Restrictions Weight Bearing Restrictions: No    Mobility  Bed Mobility               General bed mobility comments: not assessed due to pt in chair at beginning and end of the session   Transfers Overall transfer level: Needs assistance Equipment used: Rolling walker (2 wheeled) Transfers: Sit to/from Stand (2 sets ) Sit to Stand: Mod assist         General transfer comment: increased time, pt did not fully stand on either set   Ambulation/Gait             General Gait Details: not assessed due to fatigue    Stairs            Wheelchair Mobility    Modified Rankin (Stroke Patients Only)       Balance Overall balance assessment: Needs assistance Sitting-balance support: Feet supported Sitting balance-Leahy Scale: Good     Standing balance support: Bilateral upper extremity supported (RW  ) Standing balance-Leahy Scale: Fair                      Cognition Arousal/Alertness: Awake/alert Behavior During Therapy: WFL for tasks assessed/performed Overall Cognitive Status: Within Functional Limits for tasks assessed                      Exercises Total Joint Exercises Ankle Circles/Pumps: AROM;Both;10 reps Long Arc Quad: AROM;Both;10 reps General Exercises - Lower Extremity Hip Flexion/Marching: AROM;Both;10 reps (sitting in chair )  All exercises performed sitting in chair.    General Comments   Nursing was contacted and cleared pt for physical therapy.  Pt was agreeable and session was modified due to fatigue.  Pt's daughter was present at the end of the session.         Pertinent Vitals/Pain Pain Assessment: 0-10 Pain Score: 6  Pain Location: L arm and whole body  Pain Descriptors / Indicators: Aching;Discomfort Pain Intervention(s): Limited activity within patient's tolerance;Monitored during session;Premedicated before session;Repositioned  See flow sheet for vitals.     Home Living                      Prior Function  PT Goals (current goals can now be found in the care plan section) Acute Rehab PT Goals Patient Stated Goal: to go home  PT Goal Formulation: With patient Time For Goal Achievement: 01/25/16 Potential to Achieve Goals: Fair Progress towards PT goals: Progressing toward goals    Frequency  Min 2X/week    PT Plan      Co-evaluation             End of Session Equipment Utilized During Treatment: Gait belt;Oxygen (4 L/min O2 via nasal cannula ) Activity Tolerance: Patient limited by fatigue Patient left: in chair;with call bell/phone within reach;with chair alarm set;with family/visitor present     Time: [id number] PT Time Calculation (min) (ACUTE ONLY): 24 min  Charges:                       G Codes:      Mittie Bodo, SPT Mittie Bodo 01/13/2016, 11:27 AM

## 2016-01-13 NOTE — Progress Notes (Signed)
[facility] [place] NAME: Lonnie [name]    MR#:  [id number]  DATE OF BIRTH:  1927/03/18  SUBJECTIVE:  CHIEF COMPLAINT:   Chief Complaint  Patient presents with  . Influenza   - Complains of constipation. More alert now. -Hypoxic on 2 L, increased to 4 L oxygen. -still remains on 4L o2  REVIEW OF SYSTEMS:  Review of Systems  Constitutional: Positive for malaise/fatigue. Negative for fever and chills.  HENT: Positive for hearing loss. Negative for ear discharge, ear pain, nosebleeds, sore throat and tinnitus.   Eyes: Negative for blurred vision.  Respiratory: Positive for cough, shortness of breath and wheezing.   Cardiovascular: Negative for chest pain, palpitations and leg swelling.  Gastrointestinal: Positive for constipation. Negative for nausea, vomiting, abdominal pain and diarrhea.  Genitourinary: Negative for dysuria.  Musculoskeletal: Positive for back pain. Negative for myalgias.  Neurological: Negative for dizziness, sensory change, speech change, focal weakness, seizures and headaches.  Psychiatric/Behavioral: Negative for depression.    DRUG ALLERGIES:   Allergies  Allergen Reactions  . Sulfa Antibiotics Other (See Comments)    Not sure of reaction. It was too long ago, but he said not to give it to him.     VITALS:  Blood pressure 134/95, pulse 95, temperature 99 F (37.2 C), temperature source Oral, resp. rate 23, height 5\' 11"  (1.803 m), weight 82.918 kg (182 lb 12.8 oz), SpO2 94 %.  PHYSICAL EXAMINATION:  Physical Exam  GENERAL:  82 y.o.-year-old patient lying in the bed with no acute distress. Remains alert. EYES: Pupils equal, round, reactive to light and accommodation. No scleral icterus. Extraocular muscles intact.  HEENT: Head atraumatic, normocephalic. Oropharynx and nasopharynx clear.  NECK:  Supple, no jugular venous distention. No thyroid enlargement, no tenderness.  LUNGS: Improved wheezing  posteriorly at the bases noted.Rhonchorous breath sounds all over the lung fields. No crackles or rales. No use of accessory muscles of respiration.  CARDIOVASCULAR: S1, S2 normal. No rubs, or gallops. 3/6 systolic murmur present. ABDOMEN: Soft, nontender, nondistended. Bowel sounds present. No organomegaly or mass.  EXTREMITIES: No pedal edema, cyanosis, or clubbing.  NEUROLOGIC: Cranial nerves II through XII are intact. Muscle strength 5/5 in all extremities. Sensation intact. Gait not checked.  PSYCHIATRIC: The patient is alert and oriented 3 but intermittent confusion SKIN: No obvious rash, lesion, or ulcer.skin tear noted on his left arm.    LABORATORY PANEL:   CBC  Recent Labs Lab 01/13/16 0438  WBC 15.8*  HGB 8.6*  HCT 25.6*  PLT 186   ------------------------------------------------------------------------------------------------------------------  Chemistries   Recent Labs Lab 01/13/16 0438  NA 135  K 4.1  CL 109  CO2 20*  GLUCOSE 102*  BUN 25*  CREATININE 1.37*  CALCIUM 8.3*   ------------------------------------------------------------------------------------------------------------------  Cardiac Enzymes No results for input(s): TROPONINI in the last 168 hours. ------------------------------------------------------------------------------------------------------------------  RADIOLOGY:  Dg Chest Port 1 View  01/12/2016  CLINICAL DATA:  COPD high blood pressure. Hospitalization for generalized weakness and fall. Patient is noted to have a pneumonia. EXAM: PORTABLE CHEST 1 VIEW COMPARISON:  January 10, 2016 FINDINGS: The heart size and mediastinal contours are stable. There is patchy consolidation of bilateral lung bases. Chronic calcified pleural plaques are identified in the left lung base unchanged. The visualized skeletal structures are stable. IMPRESSION: Patchy consolidation of bilateral lung bases, pneumonia not excluded. Electronically Signed   By:  Abelardo Diesel M.D.   On: 01/12/2016 13:29    EKG:  Orders placed or performed in visit on 09/29/12  . EKG 12-Lead    ASSESSMENT AND PLAN:   82 year old male with past medical history of COPD/asbestosis, BPH, essential hypertension, coronary artery disease who presents to the hospital after generalized weakness and malaise and a recent fall and noted to have pneumonia.  1. Acute hypoxic respiratory failure-secondary to post influenza pneumonia -ABG showing hypoxia. Increased oxygen to 4 L -chest x-ray with left lower lobe. Finished Tamiflu as outpatient. Failed outpatient Levaquin -Blood cultures are negative. -Since worsening clinically, repeat chest x-ray with bibasilar infiltrates, On vancomycin and Zosyn. Check MRSA PCR - duonebs for his scattered wheezing. Also flutter valve or chest percussion therapy for his cough. -Pulmonary consulted- no changes made. - cough meds added  2. Generalized weakness/malaise/falls-secondary to pneumonia, recent flu.  -Physical therapy consulted and patient might need to go to rehabilitation.  3. Essential hypertension-continue lisinopril.  4. Skin tears- painful, wound consult. From the fall  5. Neuropathy - held gabapentin yesterday due to patient being very sleepy- alert today, continue to hold..  6. Constipation- meds added  7. DVT Prophylaxis- on lovenox  8. AMS- meds related vs from his infection - hold gabapentin, change tramadol to PRN. Avoid narcotics or benzo's and monitor  Physical Therapy recommended SNF     All the records are reviewed and case discussed with Care Management/Social Workerr. Management plans discussed with the patient, family and they are in agreement.  CODE STATUS: Full Code  TOTAL TIME TAKING CARE OF THIS PATIENT: 38 minutes.   POSSIBLE D/C IN 2 DAYS, DEPENDING ON CLINICAL CONDITION.   Gladstone Lighter M.D on 01/13/2016 at 12:26 PM  Between 7am to 6pm - Pager - [phone]  After 6pm go to  [email or web address] - password [email or web address] [facility]  [facility] [place] [facility]  Office  [phone]  CC: Primary care physician; No primary care provider on file.

## 2016-01-14 LAB — BASIC METABOLIC PANEL
ANION GAP: 5 (ref 5–15)
BUN: 29 mg/dL — ABNORMAL HIGH (ref 6–20)
CO2: 21 mmol/L — ABNORMAL LOW (ref 22–32)
Calcium: 8.5 mg/dL — ABNORMAL LOW (ref 8.9–10.3)
Chloride: 109 mmol/L (ref 101–111)
Creatinine, Ser: 1.36 mg/dL — ABNORMAL HIGH (ref 0.61–1.24)
GFR, EST AFRICAN AMERICAN: 52 mL/min — AB (ref >60)
GFR, EST NON AFRICAN AMERICAN: 45 mL/min — AB (ref >60)
GLUCOSE: 96 mg/dL (ref 65–99)
POTASSIUM: 4.2 mmol/L (ref 3.5–5.1)
Sodium: 135 mmol/L (ref 135–145)

## 2016-01-14 MED ORDER — GUAIFENESIN-CODEINE 100-10 MG/5ML PO SOLN
10.0000 mL | ORAL | Status: DC | PRN
  Administered 2016-01-14: 10 mL via ORAL
  Filled 2016-01-14 (×2): qty 10

## 2016-01-14 NOTE — Progress Notes (Signed)
Date: 01/14/2016,   MRN# [id number] Lonnie [name]. 09-21-1927 Code Status:     Code Status Orders        Start     Ordered   01/10/16 2015  Full code   Continuous     01/10/16 2016    Code Status History    Date Active Date Inactive Code Status Order ID Comments User Context   This patient has a current code status but no historical code status.    Advance Directive Documentation        Most Recent Value   Type of Advance Directive  Healthcare Power of Attorney, Living will   Pre-existing out of facility DNR order (yellow form or pink MOST form)     "MOST" Form in Place?       Hosp day:@LENGTHOFSTAYDAYS @ Referring MD: @ATDPROV @         HPI: Cough, sob, improving, still weak, advised him he should consider skilled care   PMHX:   Past Medical History  Diagnosis Date  . COPD (chronic obstructive pulmonary disease) ([place])   . Hypertension   . Coronary artery disease   . Bundle branch block, left   . Renal disorder   . PSA elevation    Surgical Hx:  Past Surgical History  Procedure Laterality Date  . Rectal surgery      x3  . Appendectomy     Family Hx:  Family History  Problem Relation Age of Onset  . Heart attack Mother   . Heart attack Father    Social Hx:   Social History  Substance Use Topics  . Smoking status: Former Smoker -- 2.00 packs/day for 50 years    Types: Cigarettes  . Smokeless tobacco: None  . Alcohol Use: 0.0 oz/week    0 Standard drinks or equivalent per week   Medication:    Home Medication:  No current outpatient prescriptions on file.  Current Medication: @CURMEDTAB @   Allergies:  Sulfa antibiotics  Review of Systems: Gen:  Denies  fever, sweats, chills HEENT: Denies blurred vision, double vision, ear pain, eye pain, hearing loss, nose bleeds, sore throat Cvc:  No dizziness, chest pain or heaviness Resp:  Cough, less sob   Gi: Denies swallowing difficulty, stomach pain, nausea or vomiting, diarrhea, constipation, bowel  incontinence Gu:  Denies bladder incontinence, burning urine Ext:   No Joint pain, stiffness or swelling Skin: No skin rash, easy bruising or bleeding or hives Endoc:  No polyuria, polydipsia , polyphagia or weight change Psych: No depression, insomnia or hallucinations  Other:  All other systems negative  Physical Examination:   VS: BP 169/74 mmHg  Pulse 76  Temp(Src) 97.5 F (36.4 C) (Oral)  Resp 18  Ht 5\' 11"  (1.803 m)  Wt 182 lb 12.8 oz (82.918 kg)  BMI 25.51 kg/m2  SpO2 97%  General Appearance: No distress, seem stronger. Voice stronger, fio2 at 2 liters  Neuropsych without focal findings, mental status, speech normal, alert and oriented, cranial nerves 2-12 intact, reflexes normal and symmetric, sensation grossly normal  HEENT: PERRLA, EOM intact, no ptosis, no other lesions noticed NECK: Supple, no stridor Pulmonary:.rare wheezing, No rales     Cardiovascular:  Normal S1,S2.  No m/r/g.   Abdomen:Benign, Soft, non-tender, No masses, hepatosplenomegaly, No lymphadenopathy Endoc: No evident thyromegaly, no signs of acromegaly or Cushing features Skin:   warm, no rashes, no ecchymosis  Extremities: normal, no cyanosis, clubbing, no edema, warm with normal capillary refill.    Labs  results:   Recent Labs     01/12/16  0410  01/13/16  0438  01/14/16  0457  HGB  9.0*  8.6*   --   HCT  26.6*  25.6*   --   MCV  94.3  92.9   --   WBC  15.2*  15.8*   --   BUN  30*  25*  29*  CREATININE  1.46*  1.37*  1.36*  GLUCOSE  104*  102*  96  CALCIUM  8.4*  8.3*  8.5*  ,      Rad results:  CLINICAL DATA: COPD high blood pressure. Hospitalization for generalized weakness and fall. Patient is noted to have a pneumonia.  EXAM: PORTABLE CHEST 1 VIEW  COMPARISON: January 10, 2016  FINDINGS: The heart size and mediastinal contours are stable. There is patchy consolidation of bilateral lung bases. Chronic calcified pleural plaques are identified in the left lung base  unchanged. The visualized skeletal structures are stable.  IMPRESSION: Patchy consolidation of bilateral lung bases, pneumonia not excluded.   Electronically Signed  By: Abelardo Diesel M.D.  On: 01/12/2016 13:29     Assessment and Plan: Acute on chronic resp failure. Copd exacerbation, pneumonia, s/p Influenza virus infection 2 weeks or so ago. At risk for post viral bacterial pneumonia. Continue broad antbiotic coverage Physical therapy MRSA - PCR pending Pulmonary toilet ? Sniff placement Out patient follow up    I have personally obtained a history, examined the patient, evaluated laboratory and imaging results, formulated the assessment and plan and placed orders.  The Patient requires high complexity decision making for assessment and support, frequent evaluation and titration of therapies, application of advanced monitoring technologies and extensive interpretation of multiple databases.   Herbon Fleming,M.D. Pulmonary & Critical care Medicine [facility]

## 2016-01-14 NOTE — Clinical Social Work Note (Signed)
Clinical Social Work Assessment  Patient Details  Name: Lonnie [name]. MRN: [id number] Date of Birth: [birth year]  Date of referral:  01/14/16               Reason for consult:  Facility Placement                Permission sought to share information with:  Case Manager, Customer service manager, Family Supports Permission granted to share information::  Yes, Verbal Permission Granted  Name::        Agency::  SNF referrals for ST rehab  Relationship::  Daughters (both called via numbers in chart, but numbers not working and unable to leave messages)  Contact Information:     Housing/Transportation Living arrangements for the past 2 months:  Single Family Home Source of Information:  Patient, Medical Team Patient Interpreter Needed:  None Criminal Activity/Legal Involvement Pertinent to Current Situation/Hospitalization:  No - Comment as needed Significant Relationships:  Adult Children, Other Family Members Lives with:  Self (patient was living with wife, but she recently passed away 2 weeks ago) Do you feel safe going back to the place where you live?  No (weak and needing short term SNF) Need for family participation in patient care:  Yes (Comment) (gave permission to speak with daughters, unable to reach via phone)  Care giving concerns:  Patient lives alone at home currently and unable to manage himself per report in assessment as he cannot walk to bathroom. Reports he is very weak and tired. Patient is also grieving the loss of his wife whom he lost around 2 weeks ago.  Patient does have 2 daughters, unable to reach them, but they are involved in patient care and aware of his hospital admission.  Patient agreeable for ST rehab in [place] area.   Social Worker assessment / plan:  LCSW met with patient at the bedside, explained role and agreeable to ST rehab if he can go home after.  LCSW explained process and once patient meets PT goals, he could return home. Patient  reports his daughters are involved in care.  No one at the bedside. LCSW completed SNF work up and will follow up with patient and family (if in room) regarding bed offers.  Employment status:  Retired Nurse, adult PT Recommendations:  [place] / Referral to community resources:  [place]  Patient/Family's Response to care:  Agreeable to plan  Patient/Family's Understanding of and Emotional Response to Diagnosis, Current Treatment, and Prognosis:  Patient reports he is still grieving and sad about his wife. He is aware of his current baseline and needing additional rehab at discharge.    Emotional Assessment Appearance:  Appears stated age Attitude/Demeanor/Rapport:  Guarded, Grieving Affect (typically observed):  Blunt, Frustrated Orientation:  Oriented to Self, Oriented to Place, Oriented to Situation Alcohol / Substance use:  Not Applicable Psych involvement (Current and /or in the community):  No (Comment)  Discharge Needs  Concerns to be addressed:  No discharge needs identified Readmission within the last 30 days:  No Current discharge risk:  None Barriers to Discharge:  Continued Medical Work up, [place], [place], [place] 01/14/2016, 11:34 AM

## 2016-01-14 NOTE — Care Management (Signed)
Patient has agreed for CSW to initiated bed search for SNF.  RNCM available for discharge planning if needed

## 2016-01-14 NOTE — Consult Note (Signed)
Pharmacy Antibiotic Note  Lonnie Bacher Jonpaul Regehr. is a 82 y.o. male admitted on 01/10/2016 with pneumonia.  Pharmacy has been consulted for zosyn and vancomycin dosing.  Plan: Vancomycin 1g once, then 6 hours later start 1250 q 24 for stacked dosing. Vancomycin 1250 IV every 24 hours.  Goal trough 15-20 mcg/mL. Zosyn 3.375g IV q8h (4 hour infusion). Vancomycin Trough 4/14 @ 1830  Ke 0.038, t1/2 18.24  Vd 58. Patient borderline for dose adjustment.  Renal function stable today  Height: 5\' 11"  (180.3 cm) Weight: 182 lb 12.8 oz (82.918 kg) IBW/kg (Calculated) : 75.3  Temp (24hrs), Avg:98.1 F (36.7 C), Min:97.8 F (36.6 C), Max:98.3 F (36.8 C)   Recent Labs Lab 01/10/16 1416 01/10/16 1642 01/11/16 0608 01/12/16 0410 01/13/16 0438 01/14/16 0457  WBC 13.7*  --  12.2* 15.2* 15.8*  --   CREATININE 1.65*  --  1.35* 1.46* 1.37* 1.36*  LATICACIDVEN 1.6 1.4  --   --   --   --     Estimated Creatinine Clearance: 40.8 mL/min (by C-G formula based on Cr of 1.36).    Allergies  Allergen Reactions  . Sulfa Antibiotics Other (See Comments)    Not sure of reaction. It was too long ago, but he said not to give it to him.     Antimicrobials this admission: levofloxacin 4/9 >> 4/11 vancomycin 4/11 >>  Zosyn 4/11>>  Dose adjustments this admission:   Microbiology results: 4/9 BCx: NGTD x4 days 4/12 MRSA PCR: negative  Thank you for allowing pharmacy to be a part of this patient's care.  Rayna Sexton, PharmD, BCPS Clinical Pharmacist 01/14/2016 11:09 AM

## 2016-01-14 NOTE — Clinical Social Work Placement (Signed)
   CLINICAL SOCIAL WORK PLACEMENT  NOTE  Date:  01/14/2016  Patient Details  Name: Lonnie [name]. MRN: [id number] Date of Birth: [birth year]  Clinical Social Work is seeking post-discharge placement for this patient at the [place] level of care (*CSW will initial, date and re-position this form in  chart as items are completed):  Yes   Patient/family provided with [place] Work Department's list of facilities offering this level of care within the geographic area requested by the patient (or if unable, by the patient's family).  Yes   Patient/family informed of their freedom to choose among providers that offer the needed level of care, that participate in Medicare, Medicaid or managed care program needed by the patient, have an available bed and are willing to accept the patient.  Yes   Patient/family informed of [facility]'s ownership interest in [facility] and [facility], as well as of the fact that they are under no obligation to receive care at these facilities.  PASRR submitted to EDS on 01/14/16     PASRR number received on       Existing PASRR number confirmed on       FL2 transmitted to all facilities in geographic area requested by pt/family on 01/14/16     FL2 transmitted to all facilities within larger geographic area on       Patient informed that his/her managed care company has contracts with or will negotiate with certain facilities, including the following:            Patient/family informed of bed offers received.  Patient chooses bed at       Physician recommends and patient chooses bed at      Patient to be transferred to   on  .  Patient to be transferred to facility by       Patient family notified on   of transfer.  Name of family member notified:        PHYSICIAN Please sign FL2     Additional Comment:    _______________________________________________ Lilly Cove, LCSW 01/14/2016, 11:40  AM

## 2016-01-14 NOTE — Progress Notes (Signed)
Clinical Social Work: aware of consult and attempted to see patient at the bedside. Patient using urinal and LCSW respected privacy and will follow up with patient today.  Call placed to daughter with regards to additional information and wishes for disposition. Discussed case with CM and will follow up.  Lane Hacker, MSW Clinical Social Work: [employer] [phone]

## 2016-01-14 NOTE — NC FL2 (Signed)
[place] LEVEL OF CARE SCREENING TOOL     IDENTIFICATION  Patient Name: Lonnie [name]. Birthdate: [birth year] Sex: male Admission Date (Current Location): 01/10/2016  [place] and [place] Number:  Engineering geologist and Address:  [facility], [address] [place], [place] [address]      Provider Number: [id number]  Attending Physician Name and Address:  Lytle Butte, MD  Relative Name and Phone Number:       Current Level of Care: Hospital Recommended Level of Care: [place] Prior Approval Number:    Date Approved/Denied:   PASRR Number:  [id number] SS # [id number]  Discharge Plan: SNF    Current Diagnoses: Patient Active Problem List   Diagnosis Date Noted  . Pneumonia 01/10/2016    Orientation RESPIRATION BLADDER Height & Weight     Self, Situation, Place  Normal Continent Weight: 182 lb 12.8 oz (82.918 kg) Height:  5\' 11"  (180.3 cm)  BEHAVIORAL SYMPTOMS/MOOD NEUROLOGICAL BOWEL NUTRITION STATUS      Continent Diet (regular)  AMBULATORY STATUS COMMUNICATION OF NEEDS Skin   Extensive Assist Verbally PU Stage and Appropriate Care, Other (Comment) (Several areas of partial thickness skin loss to anterior and posterior elbow area.   Dressing procedure/placement/frequency: Xeroform gauze to promote moist healing and decrease adherence and discomfort with dressing changes. Orders provided for bedside ) PU Stage 1 Dressing: QID                     Personal Care Assistance Level of Assistance  Bathing, Feeding, Dressing Bathing Assistance: Maximum assistance Feeding assistance: Maximum assistance Dressing Assistance: Maximum assistance     Functional Limitations Info  Sight, Hearing, Speech Sight Info: Adequate Hearing Info: Adequate Speech Info: Adequate    SPECIAL CARE FACTORS FREQUENCY  PT (By licensed PT), OT (By licensed OT)     PT Frequency: 5 OT Frequency: 5             Contractures Contractures Info: Not present    Additional Factors Info  Code Status, Allergies, Psychotropic, Insulin Sliding Scale, Isolation Precautions Code Status Info: Full Code Allergies Info: Sulfa Antibiotics Psychotropic Info: none Insulin Sliding Scale Info: none Isolation Precautions Info: none     Current Medications (01/14/2016):  This is the current hospital active medication list Current Facility-Administered Medications  Medication Dose Route Frequency Provider Last Rate Last Dose  . acetaminophen (TYLENOL) tablet 650 mg  650 mg Oral Q6H PRN Henreitta Leber, MD       Or  . acetaminophen (TYLENOL) suppository 650 mg  650 mg Rectal Q6H PRN Henreitta Leber, MD      . alfuzosin (UROXATRAL) 24 hr tablet 10 mg  10 mg Oral Q breakfast Henreitta Leber, MD   10 mg at 01/14/16 0949  . aspirin chewable tablet 81 mg  81 mg Oral Daily Henreitta Leber, MD   81 mg at 01/14/16 0948  . atorvastatin (LIPITOR) tablet 80 mg  80 mg Oral Daily Henreitta Leber, MD   80 mg at 01/14/16 0948  . bisacodyl (DULCOLAX) EC tablet 10 mg  10 mg Oral Daily PRN Gladstone Lighter, MD   10 mg at 01/13/16 1025  . enoxaparin (LOVENOX) injection 40 mg  40 mg Subcutaneous Q24H Gladstone Lighter, MD   40 mg at 01/13/16 2250  . ferrous sulfate tablet 325 mg  325 mg Oral Q breakfast Henreitta Leber, MD   325 mg at 01/14/16  [id number]  . guaiFENesin-codeine 100-10 MG/5ML solution 10 mL  10 mL Oral Q4H PRN Lytle Butte, MD   10 mL at 01/14/16 0958  . ipratropium-albuterol (DUONEB) 0.5-2.5 (3) MG/3ML nebulizer solution 3 mL  3 mL Nebulization Q6H Gladstone Lighter, MD   3 mL at 01/14/16 0743  . lactulose (CHRONULAC) 10 GM/15ML solution 20 g  20 g Oral BID PRN Gladstone Lighter, MD   20 g at 01/13/16 1432  . lisinopril (PRINIVIL,ZESTRIL) tablet 40 mg  40 mg Oral Daily Henreitta Leber, MD   40 mg at 01/14/16 0947  . magnesium oxide (MAG-OX) tablet 400 mg  400 mg Oral Daily Henreitta Leber, MD   400 mg at 01/14/16 0947  .  mometasone-formoterol (DULERA) 200-5 MCG/ACT inhaler 2 puff  2 puff Inhalation BID Gladstone Lighter, MD   2 puff at 01/14/16 0950  . ondansetron (ZOFRAN) tablet 4 mg  4 mg Oral Q6H PRN Henreitta Leber, MD       Or  . ondansetron (ZOFRAN) injection 4 mg  4 mg Intravenous Q6H PRN Henreitta Leber, MD      . pantoprazole (PROTONIX) EC tablet 40 mg  40 mg Oral Daily Henreitta Leber, MD   40 mg at 01/14/16 0947  . piperacillin-tazobactam (ZOSYN) IVPB 3.375 g  3.375 g Intravenous 3 times per day Gladstone Lighter, MD   3.375 g at 01/14/16 0512  . ramelteon (ROZEREM) tablet 8 mg  8 mg Oral QHS Lance Coon, MD   8 mg at 01/13/16 2251  . senna-docusate (Senokot-S) tablet 2 tablet  2 tablet Oral BID Gladstone Lighter, MD   2 tablet at 01/14/16 0947  . traMADol (ULTRAM) tablet 50 mg  50 mg Oral Q12H PRN Gladstone Lighter, MD   50 mg at 01/13/16 0732  . triamcinolone cream (KENALOG) 0.1 % 1 application  1 application Topical BID Henreitta Leber, MD   1 application at [id number]  . vancomycin (VANCOCIN) 1,250 mg in sodium chloride 0.9 % 250 mL IVPB  1,250 mg Intravenous Q24H Gladstone Lighter, MD   1,250 mg at 01/13/16 2040  . vitamin C (ASCORBIC ACID) tablet 1,000 mg  1,000 mg Oral Daily Henreitta Leber, MD   1,000 mg at 01/14/16 0947  . zolpidem (AMBIEN) tablet 10 mg  10 mg Oral QHS PRN Henreitta Leber, MD   10 mg at 01/13/16 2251     Discharge Medications: Please see discharge summary for a list of discharge medications.  Relevant Imaging Results:  Relevant Lab Results:   Additional Information    Lilly Cove, LCSW

## 2016-01-14 NOTE — Care Management Important Message (Signed)
Important Message  Patient Details  Name: Lonnie [name]. MRN: [id number] Date of Birth: 05/31/1927   Medicare Important Message Given:  Yes    Juliann Pulse A Donyea Gafford 01/14/2016, 11:25 AM

## 2016-01-14 NOTE — Progress Notes (Signed)
[name] [place] NAME: Lonnie [name]    MRN#:  [id number]  DATE OF BIRTH:  01/04/1927  SUBJECTIVE:  Hospital Day: 4 days Lonnie [name] is a 82 y.o. male presenting with Influenza . , Shortness of breath  Overnight events: No overnight events Interval Events: Still continues to have issues with breathing on 3 L of oxygen today, continue nonproductive coughing and shortness of breath with minimal exertion  REVIEW OF SYSTEMS:  CONSTITUTIONAL: No fever, positive fatigue or weakness.  EYES: No blurred or double vision.  EARS, NOSE, AND THROAT: No tinnitus or ear pain.  RESPIRATORY: Positive cough, shortness of breath, denies wheezing or hemoptysis.  CARDIOVASCULAR: No chest pain, orthopnea, edema.  GASTROINTESTINAL: No nausea, vomiting, diarrhea or abdominal pain.  GENITOURINARY: No dysuria, hematuria.  ENDOCRINE: No polyuria, nocturia,  HEMATOLOGY: No anemia, easy bruising or bleeding SKIN: No rash or lesion. MUSCULOSKELETAL: No joint pain or arthritis.   NEUROLOGIC: No tingling, numbness, weakness.  PSYCHIATRY: No anxiety or depression.   DRUG ALLERGIES:   Allergies  Allergen Reactions  . Sulfa Antibiotics Other (See Comments)    Not sure of reaction. It was too long ago, but he said not to give it to him.     VITALS:  Blood pressure 151/57, pulse 87, temperature 98.3 F (36.8 C), temperature source Oral, resp. rate 22, height 5\' 11"  (1.803 m), weight 82.918 kg (182 lb 12.8 oz), SpO2 99 %.  PHYSICAL EXAMINATION:  VITAL SIGNS: Filed Vitals:   01/13/16 2109 01/14/16 0452  BP: 157/53 151/57  Pulse: 82 87  Temp: 98.2 F (36.8 C) 98.3 F (36.8 C)  Resp: 18 25   GENERAL:82 y.o.male currently in moderate acute distress. Respiratory status/coughing HEAD: Normocephalic, atraumatic.  EYES: Pupils equal, round, reactive to light. Extraocular muscles intact. No scleral icterus.  MOUTH: Moist mucosal membrane. Dentition  intact. No abscess noted.  EAR, NOSE, THROAT: Clear without exudates. No external lesions.  NECK: Supple. No thyromegaly. No nodules. No JVD.  PULMONARY: Diffuse coarse breath sounds scattered rhonchi without wheeze. No use of accessory muscles, Good respiratory effort. good air entry bilaterally CHEST: Nontender to palpation.  CARDIOVASCULAR: S1 and S2. Regular rate and rhythm. No murmurs, rubs, or gallops. No edema. Pedal pulses 2+ bilaterally.  GASTROINTESTINAL: Soft, nontender, nondistended. No masses. Positive bowel sounds. No hepatosplenomegaly.  MUSCULOSKELETAL: No swelling, clubbing, or edema. Range of motion full in all extremities.  NEUROLOGIC: Cranial nerves II through XII are intact. No gross focal neurological deficits. Sensation intact. Reflexes intact.  SKIN: No ulceration, lesions, rashes, or cyanosis. Skin warm and dry. Turgor intact.  PSYCHIATRIC: Mood, affect within normal limits. The patient is awake, alert and oriented x 3. Insight, judgment intact.      LABORATORY PANEL:   CBC  Recent Labs Lab 01/13/16 0438  WBC 15.8*  HGB 8.6*  HCT 25.6*  PLT 186   ------------------------------------------------------------------------------------------------------------------  Chemistries   Recent Labs Lab 01/14/16 0457  NA 135  K 4.2  CL 109  CO2 21*  GLUCOSE 96  BUN 29*  CREATININE 1.36*  CALCIUM 8.5*   ------------------------------------------------------------------------------------------------------------------  Cardiac Enzymes No results for input(s): TROPONINI in the last 168 hours. ------------------------------------------------------------------------------------------------------------------  RADIOLOGY:  Dg Chest Port 1 View  01/12/2016  CLINICAL DATA:  COPD high blood pressure. Hospitalization for generalized weakness and fall. Patient is noted to have a pneumonia. EXAM: PORTABLE CHEST 1 VIEW COMPARISON:  January 10, 2016 FINDINGS: The heart size  and mediastinal contours  are stable. There is patchy consolidation of bilateral lung bases. Chronic calcified pleural plaques are identified in the left lung base unchanged. The visualized skeletal structures are stable. IMPRESSION: Patchy consolidation of bilateral lung bases, pneumonia not excluded. Electronically Signed   By: Abelardo Diesel M.D.   On: 01/12/2016 13:29    EKG:   Orders placed or performed in visit on 09/29/12  . EKG 12-Lead    ASSESSMENT AND PLAN:   Lonnie [name] is a 82 y.o. male presenting with Influenza . Admitted 01/10/2016 : Day #: 100 days   82 year old male with past medical history of COPD/asbestosis, BPH, essential hypertension, coronary artery disease who presents to the hospital after generalized weakness and malaise and a recent fall and noted to have pneumonia.  1. Acute hypoxic respiratory failure-secondary to post influenza pneumonia On vancomycin and Zosyn. Check MRSA PCR - duonebs for his scattered wheezing. Also flutter valve or chest percussion therapy for his cough. - cough med  2. Generalized weakness/malaise/falls-secondary to pneumonia, PT 3. Essential hypertension-continue lisinopril.  4. Skin tears- painful, wound consult. From the fall  5. Neuropathy - held gabapentin yesterday due to patient being very sleepy- alert today, continue to hold..  6. Constipation- meds added  7. DVT Prophylaxis- on lovenox   Physical Therapy recommended SNF   All the records are reviewed and case discussed with Care Management/Social Workerr. Management plans discussed with the patient, family and they are in agreement.  CODE STATUS: full TOTAL TIME TAKING CARE OF THIS PATIENT: 28 minutes.   POSSIBLE D/C IN 1-2DAYS, DEPENDING ON CLINICAL CONDITION.   Hower,  [name] on 01/14/2016 at 11:48 AM  Between 7am to 6pm - Pager - [phone]  After 6pm: House Pager: - [phone]  [name]  Office  [phone]  CC: Primary care  physician; No primary care provider on file.

## 2016-01-15 MED ORDER — VERAPAMIL HCL ER 120 MG PO TBCR
120.0000 mg | EXTENDED_RELEASE_TABLET | Freq: Every day | ORAL | Status: DC
  Administered 2016-01-15 – 2016-01-18 (×4): 120 mg via ORAL
  Filled 2016-01-15 (×4): qty 1

## 2016-01-15 MED ORDER — ENSURE ENLIVE PO LIQD
237.0000 mL | Freq: Three times a day (TID) | ORAL | Status: DC
  Administered 2016-01-15 – 2016-01-17 (×7): 237 mL via ORAL

## 2016-01-15 MED ORDER — HYDRALAZINE HCL 20 MG/ML IJ SOLN
10.0000 mg | Freq: Once | INTRAMUSCULAR | Status: AC
  Administered 2016-01-15: 10 mg via INTRAVENOUS
  Filled 2016-01-15: qty 1

## 2016-01-15 MED ORDER — GUAIFENESIN ER 600 MG PO TB12
600.0000 mg | ORAL_TABLET | Freq: Two times a day (BID) | ORAL | Status: DC
  Administered 2016-01-15 – 2016-01-17 (×5): 600 mg via ORAL
  Filled 2016-01-15 (×5): qty 1

## 2016-01-15 NOTE — Progress Notes (Signed)
[facility] [place] NAME: Lonnie [name]    MRN#:  [id number]  DATE OF BIRTH:  March 15, 1927  SUBJECTIVE:  Hospital Day: 5 days Lonnie [name] is a 82 y.o. male presenting with Influenza . , Shortness of breath  Overnight events: No overnight events Interval Events: States can't breathe, continue coughing however oxygen saturation and respiratory rate within normal limits  REVIEW OF SYSTEMS:  CONSTITUTIONAL: No fever, positive fatigue or weakness.  EYES: No blurred or double vision.  EARS, NOSE, AND THROAT: No tinnitus or ear pain.  RESPIRATORY: Positive cough, shortness of breath, denies wheezing or hemoptysis.  CARDIOVASCULAR: No chest pain, orthopnea, edema.  GASTROINTESTINAL: No nausea, vomiting, diarrhea or abdominal pain.  GENITOURINARY: No dysuria, hematuria.  ENDOCRINE: No polyuria, nocturia,  HEMATOLOGY: No anemia, easy bruising or bleeding SKIN: No rash or lesion. MUSCULOSKELETAL: No joint pain or arthritis.   NEUROLOGIC: No tingling, numbness, weakness.  PSYCHIATRY: No anxiety or depression.   DRUG ALLERGIES:   Allergies  Allergen Reactions  . Sulfa Antibiotics Other (See Comments)    Not sure of reaction. It was too long ago, but he said not to give it to him.     VITALS:  Blood pressure 159/95, pulse 76, temperature 98 F (36.7 C), temperature source Oral, resp. rate 22, height 5\' 11"  (1.803 m), weight 82.918 kg (182 lb 12.8 oz), SpO2 96 %.  PHYSICAL EXAMINATION:  VITAL SIGNS: Filed Vitals:   01/14/16 2127 01/15/16 0540  BP: 157/66 159/95  Pulse: 81 76  Temp: 98.1 F (36.7 C) 98 F (36.7 C)  Resp: 21 22   GENERAL:82 y.o.male currently in moderate acute distress. Respiratory status/coughing HEAD: Normocephalic, atraumatic.  EYES: Pupils equal, round, reactive to light. Extraocular muscles intact. No scleral icterus.  MOUTH: Moist mucosal membrane. Dentition intact. No abscess noted.  EAR, NOSE, THROAT:  Clear without exudates. No external lesions.  NECK: Supple. No thyromegaly. No nodules. No JVD.  PULMONARY: Diffuse coarse breath sounds without  wheeze. No use of accessory muscles, Good respiratory effort. good air entry bilaterally CHEST: Nontender to palpation.  CARDIOVASCULAR: S1 and S2. Regular rate and rhythm. No murmurs, rubs, or gallops. No edema. Pedal pulses 2+ bilaterally.  GASTROINTESTINAL: Soft, nontender, nondistended. No masses. Positive bowel sounds. No hepatosplenomegaly.  MUSCULOSKELETAL: No swelling, clubbing, or edema. Range of motion full in all extremities.  NEUROLOGIC: Cranial nerves II through XII are intact. No gross focal neurological deficits. Sensation intact. Reflexes intact.  SKIN: No ulceration, lesions, rashes, or cyanosis. Skin warm and dry. Turgor intact.  PSYCHIATRIC: Mood, affect within normal limits. The patient is awake, alert and oriented x 3. Insight, judgment intact.      LABORATORY PANEL:   CBC  Recent Labs Lab 01/13/16 0438  WBC 15.8*  HGB 8.6*  HCT 25.6*  PLT 186   ------------------------------------------------------------------------------------------------------------------  Chemistries   Recent Labs Lab 01/14/16 0457  NA 135  K 4.2  CL 109  CO2 21*  GLUCOSE 96  BUN 29*  CREATININE 1.36*  CALCIUM 8.5*   ------------------------------------------------------------------------------------------------------------------  Cardiac Enzymes No results for input(s): TROPONINI in the last 168 hours. ------------------------------------------------------------------------------------------------------------------  RADIOLOGY:  No results found.  EKG:   Orders placed or performed in visit on 09/29/12  . EKG 12-Lead    ASSESSMENT AND PLAN:   Lonnie [name] is a 82 y.o. male presenting with Influenza . Admitted 01/10/2016 : Day #: 40 days   82 year old male with past medical history of COPD/asbestosis, BPH,  essential  hypertension, coronary artery disease who presents to the hospital after generalized weakness and malaise and a recent fall and noted to have pneumonia.  1. Acute hypoxic respiratory failure-secondary to post influenza pneumonia Vancomycin stopped yesterday MRSA PCR negative, continue Zosyn - duonebs, flutter valve, Mucinex- cough med Continue to wean oxygen  2. Generalized weakness/malaise/falls-secondary to pneumonia, PT 3. Essential hypertension-continue lisinopril.  4. Skin tears- painful, wound consult. From the fall  5. Neuropathy - held gabapentin yesterday due to patient being very sleepy- alert today, continue to hold..  6. Constipation- meds added  7. DVT Prophylaxis- on lovenox   Disposition SNF   All the records are reviewed and case discussed with Care Management/Social Workerr. Management plans discussed with the patient, family and they are in agreement.  CODE STATUS: full TOTAL TIME TAKING CARE OF THIS PATIENT: 28 minutes.   POSSIBLE D/C IN 1-2DAYS, DEPENDING ON CLINICAL CONDITION.   Lonnie [name],  [name] on 01/15/2016 at 12:33 PM  Between 7am to 6pm - Pager - [phone]  After 6pm: House Pager: - [phone]  [name]  Office  [phone]  CC: Primary care physician; No primary care provider on file.

## 2016-01-15 NOTE — Consult Note (Signed)
Pharmacy Antibiotic Note  Lonnie [name]. is a 82 y.o. male admitted on 01/10/2016 with pneumonia.  Pharmacy has been consulted for zosyn and vancomycin dosing.  Plan: Vancomycin has been d/c due to neg MRSA PCR Continue zosyn 3.375g q 8 hours for broad spectrum coverage. Blood cx neg. Renal function stable  Height: 5\' 11"  (180.3 cm) Weight: 182 lb 12.8 oz (82.918 kg) IBW/kg (Calculated) : 75.3  Temp (24hrs), Avg:97.9 F (36.6 C), Min:97.5 F (36.4 C), Max:98.1 F (36.7 C)   Recent Labs Lab 01/10/16 1416 01/10/16 1642 01/11/16 0608 01/12/16 0410 01/13/16 0438 01/14/16 0457  WBC 13.7*  --  12.2* 15.2* 15.8*  --   CREATININE 1.65*  --  1.35* 1.46* 1.37* 1.36*  LATICACIDVEN 1.6 1.4  --   --   --   --     Estimated Creatinine Clearance: 40.8 mL/min (by C-G formula based on Cr of 1.36).    Allergies  Allergen Reactions  . Sulfa Antibiotics Other (See Comments)    Not sure of reaction. It was too long ago, but he said not to give it to him.     Antimicrobials this admission: levofloxacin 4/9 >> 4/11 vancomycin 4/11 >> 4/13 Zosyn 4/11>>  Dose adjustments this admission:   Microbiology results: 4/9 BCx: NGTD x4 days 4/12 MRSA PCR: negative  Thank you for allowing pharmacy to be a part of this patient's care.  Ramond Dial, Pharm.D Clinical Pharmacist   01/15/2016 11:53 AM

## 2016-01-15 NOTE — Progress Notes (Signed)
Initial Nutrition Assessment     INTERVENTION:  -Monitor intake and cater to pt preferences. Discussed high calorie, high protein foods with pt and dtrs at bedside -Recommend ensure enlive TID for added nutrition on trays per family request   NUTRITION DIAGNOSIS:   Inadequate oral intake related to acute illness as evidenced by meal completion < 25%, per patient/family report.    GOAL:   Patient will meet greater than or equal to 90% of their needs    MONITOR:   PO intake, Supplement acceptance  REASON FOR ASSESSMENT:   Consult Poor PO  ASSESSMENT:   82 y/o male admitted with shortness of breath, flu positive, weakness, pneumonia  Past Medical History  Diagnosis Date  . COPD (chronic obstructive pulmonary disease) ([place])   . Hypertension   . Coronary artery disease   . Bundle branch block, left   . Renal disorder   . PSA elevation     2 Dtrs at bedside and reports poor po intake for the past 3 days of admission. Report intake good/normal prior to admission, eating well. Friend brought in Cookout milkshake for pt and Dtr feeding during visit  Medications reviewed Fe sulfate, Vit C, senokot S, Vit C, dulcolax, lactulose  Lab reviewed: BUN 29, creatinine 1.36, Calcium 8.5  Diet Order:  Diet regular Room service appropriate?: Yes; Fluid consistency:: Thin  Skin:  Reviewed, no issues  Last BM:  4/14  Height:   Ht Readings from Last 1 Encounters:  01/10/16 5\' 11"  (1.803 m)    Weight: family reports stable wt prior to admission  Wt Readings from Last 1 Encounters:  01/10/16 182 lb 12.8 oz (82.918 kg)    Ideal Body Weight:     BMI:  Body mass index is 25.51 kg/(m^2).  Estimated Nutritional Needs:   Kcal:  [id number] kcals/d.   Protein:  83-100 g/d  Fluid:  2-2.4 L/d  EDUCATION NEEDS:   No education needs identified at this time Bryceton Hantz B. Zenia Resides, [place], [place] (pager) Weekend/On-Call pager [phone])

## 2016-01-15 NOTE — Progress Notes (Signed)
LCSW met with patient, daughter: Adonis Huguenin [phone]) and her husband at the bedside. Daughter more agreeable to SNF as a second option as she reports she would like him to go home with home health if at all possible. She reports pt has a long negative hx with nursing homes when caring for his mother. LCSW explained SNF benefit along with changes to placement and hopeful for quick rehab and back home. Daughter agreeable and would like [facility] as her first choice for SNF.  Patient is not medically stable at this time.  Will contact facility and let them know interest of bed. Message left.  Lane Hacker, MSW Clinical Social Work: [facility] [employer] [phone]

## 2016-01-16 NOTE — Progress Notes (Signed)
Physical Therapy Treatment Patient Details Name: Lonnie [name]. MRN: [id number] DOB: Jan 02, 1927 Today's Date: 01/16/2016    History of Present Illness Pt is a 82 y.o. male with PMH of COPD, asbestos exposure, HTN, CAD, and neuropathy.  Pt presented with generalized weakness and a fall.  Pt was admitted for pneumonia.  Pt was intially placed on 2 L/min O2 and has recently been placed on 4 L/min O2 (both via nasal cannula)    PT Comments    Pt asleep upon entry, easily awakened, but remains hypoactive throughout session. Pt immediately begins to discuss wife's recent death. When asked "how did she die?" he responds, "she had the flu..I gave it to her..I killed her." Pt reports some knee stiffness/pain, which relents with AAROM in bed. Pt requires modA for bed mobility and transfers, without improvement since last PT session. SaO2 remains stable on 2L, but pt coughing throughout most of session once mobility is initiated. Pt is not interested in continuing with PT due to fatigue. Will continue with POC as outlined in evaluation. Spoke to RN, recommended pt getting to chair daily, as well as a pastoral consult to help pt with grieving process.   Follow Up Recommendations  SNF     Equipment Recommendations  Rolling walker with 5" wheels    Recommendations for Other Services       Precautions / Restrictions Precautions Precautions: Fall    Mobility  Bed Mobility Overal bed mobility: Needs Assistance Bed Mobility: Supine to Sit   Sidelying to sit: Mod assist       General bed mobility comments: Pt able to move legs, but requires modA for trunk to come forward to EOB.   Transfers Overall transfer level: Needs assistance Equipment used: 1 person hand held assist Transfers: Sit to/from Stand Sit to Stand: Mod assist         General transfer comment: coughing constantly, difficulty with lifting head (in end range flexion) like due to global weakness in the setting of chronic  pillow use while slouched in bed.   Ambulation/Gait Ambulation/Gait assistance:  (bedside to chair. )               Stairs            Wheelchair Mobility    Modified Rankin (Stroke Patients Only)       Balance   Sitting-balance support: Bilateral upper extremity supported   Sitting balance - Comments: coughing a lot which inhibits ability to maintain posture. Falls posteriorly and left.                             Cognition Arousal/Alertness:  (drowsy, asleep upon entry ) Behavior During Therapy: WFL for tasks assessed/performed Overall Cognitive Status: Within Functional Limits for tasks assessed                      Exercises Other Exercises Other Exercises: Heel slides in bed. x15 bilat, poor endurance, requires mod-max A toward end of set. Alieviates bilat knee stiffness/pain.     General Comments        Pertinent Vitals/Pain Pain Location: L arm hurt, pt does not rate.     Home Living                      Prior Function            PT Goals (current goals can now be  found in the care plan section) Acute Rehab PT Goals Patient Stated Goal: to go home  PT Goal Formulation: With patient Time For Goal Achievement: 01/25/16 Potential to Achieve Goals: Poor Progress towards PT goals: PT to reassess next treatment    Frequency  Min 2X/week    PT Plan Current plan remains appropriate    Co-evaluation             End of Session Equipment Utilized During Treatment: Gait belt;Oxygen Activity Tolerance: Patient limited by fatigue;Patient limited by lethargy;Other (comment) (Patient seems very sad, fatigued, and reports that he has had a BM and needs to be cleaned. ) Patient left: in chair;with call bell/phone within reach;with chair alarm set;with family/visitor present     Time: [id number] PT Time Calculation (min) (ACUTE ONLY): 19 min  Charges:  $Therapeutic Activity: 8-22 mins                    G Codes:       3:58 PM, 01-29-16 Lonnie [name], PT, DPT PRN Physical Therapist - [place] License # [id number] [phone] (mobile)

## 2016-01-16 NOTE — Progress Notes (Signed)
Lonnie [name]    MRN#:  [id number]  DATE OF BIRTH:  [birth year]  SUBJECTIVE:  Hospital Day: 6 days Lonnie [name] is a 82 y.o. male presenting with Influenza . , Shortness of breath  Overnight events: No overnight events Interval Events: Feel somewhat better but definitely not at baseline complains of cough and shortness of breath  REVIEW OF SYSTEMS:  CONSTITUTIONAL: No fever, positive fatigue or weakness.  EYES: No blurred or double vision.  EARS, NOSE, AND THROAT: No tinnitus or ear pain.  RESPIRATORY: Positive cough, shortness of breath, denies wheezing or hemoptysis.  CARDIOVASCULAR: No chest pain, orthopnea, edema.  GASTROINTESTINAL: No nausea, vomiting, diarrhea or abdominal pain.  GENITOURINARY: No dysuria, hematuria.  ENDOCRINE: No polyuria, nocturia,  HEMATOLOGY: No anemia, easy bruising or bleeding SKIN: No rash or lesion. MUSCULOSKELETAL: No joint pain or arthritis.   NEUROLOGIC: No tingling, numbness, weakness.  PSYCHIATRY: No anxiety or depression.   DRUG ALLERGIES:   Allergies  Allergen Reactions  . Sulfa Antibiotics Other (See Comments)    Not sure of reaction. It was too long ago, but he said not to give it to him.     VITALS:  Blood pressure 156/60, pulse 74, temperature 98.4 F (36.9 C), temperature source Oral, resp. rate 16, height 5\' 11"  (1.803 m), weight 82.918 kg (182 lb 12.8 oz), SpO2 97 %.  PHYSICAL EXAMINATION:  VITAL SIGNS: Filed Vitals:   01/16/16 0511 01/16/16 1241  BP: 165/66 156/60  Pulse: 72 74  Temp: 97.7 F (36.5 C) 98.4 F (36.9 C)  Resp: 14 16   GENERAL:82 y.o.male currently in moderate acute distress. Respiratory status/coughing HEAD: Normocephalic, atraumatic.  EYES: Pupils equal, round, reactive to light. Extraocular muscles intact. No scleral icterus.  MOUTH: Moist mucosal membrane. Dentition intact. No abscess noted.  EAR, NOSE, THROAT: Clear  without exudates. No external lesions.  NECK: Supple. No thyromegaly. No nodules. No JVD.  PULMONARY: Improved breath sounds without  wheeze. No use of accessory muscles, Good respiratory effort. good air entry bilaterally CHEST: Nontender to palpation.  CARDIOVASCULAR: S1 and S2. Regular rate and rhythm. No murmurs, rubs, or gallops. No edema. Pedal pulses 2+ bilaterally.  GASTROINTESTINAL: Soft, nontender, nondistended. No masses. Positive bowel sounds. No hepatosplenomegaly.  MUSCULOSKELETAL: No swelling, clubbing, or edema. Range of motion full in all extremities.  NEUROLOGIC: Cranial nerves II through XII are intact. No gross focal neurological deficits. Sensation intact. Reflexes intact.  SKIN: No ulceration, lesions, rashes, or cyanosis. Skin warm and dry. Turgor intact.  PSYCHIATRIC: Mood, affect within normal limits. The patient is awake, alert and oriented x 3. Insight, judgment intact.      LABORATORY PANEL:   CBC  Recent Labs Lab 01/13/16 0438  WBC 15.8*  HGB 8.6*  HCT 25.6*  PLT 186   ------------------------------------------------------------------------------------------------------------------  Chemistries   Recent Labs Lab 01/14/16 0457  NA 135  K 4.2  CL 109  CO2 21*  GLUCOSE 96  BUN 29*  CREATININE 1.36*  CALCIUM 8.5*   ------------------------------------------------------------------------------------------------------------------  Cardiac Enzymes No results for input(s): TROPONINI in the last 168 hours. ------------------------------------------------------------------------------------------------------------------  RADIOLOGY:  No results found.  EKG:   Orders placed or performed in visit on 09/29/12  . EKG 12-Lead    ASSESSMENT AND PLAN:   Lonnie [name] is a 82 y.o. male presenting with Influenza . Admitted 01/10/2016 : Day #: 11 days   82 year old male with past medical history of COPD/asbestosis, BPH,  essential hypertension,  coronary artery disease who presents to the hospital after generalized weakness and malaise and a recent fall and noted to have pneumonia.  1. Acute hypoxic respiratory failure-secondary to post influenza pneumonia Vancomycin stopped yesterday MRSA PCR negative, continue Zosyn #4/5-7 - duonebs, flutter valve, Mucinex- cough med Continue to wean oxygen  2. Generalized weakness/malaise/falls-secondary to pneumonia, PT 3. Essential hypertension-continue lisinopril.  4. Skin tears- painful, wound consult. From the fall  5. Neuropathy - held gabapentin 6. Constipation- meds added  7. DVT Prophylaxis- on lovenox   Disposition SNF   All the records are reviewed and case discussed with Care Management/Social Workerr. Management plans discussed with the patient, family and they are in agreement.  CODE STATUS: full TOTAL TIME TAKING CARE OF THIS PATIENT: 28 minutes.   POSSIBLE D/C IN 1-2DAYS, DEPENDING ON CLINICAL CONDITION.   Hower,  [name] on 01/16/2016 at 1:25 PM  Between 7am to 6pm - Pager - [phone]  After 6pm: House Pager: - [phone]  [name]  Office  [phone]  CC: Primary care physician; No primary care provider on file.

## 2016-01-16 NOTE — Progress Notes (Signed)
Clinical Education officer, museum (CSW) met with patient and presented bed offers. Patient chose [name]. CSW also contacted patient's daughter Loletha Carrow and made her aware of above. Per MD patient may be medically stable for D/C Monday. CSW will continue to follow and assist as needed.   Blima Rich, LCSW [phone]

## 2016-01-16 NOTE — Progress Notes (Signed)
Chaplain rounded the unit and provided a compassionate presence and support to the patient. Chaplain Cherylene Ferrufino [phone] 

## 2016-01-17 ENCOUNTER — Inpatient Hospital Stay: Payer: Medicare Other

## 2016-01-17 LAB — CBC WITH DIFFERENTIAL/PLATELET
BASOS PCT: 0 %
Basophils Absolute: 0 10*3/uL (ref 0–0.1)
EOS ABS: 0.1 10*3/uL (ref 0–0.7)
EOS PCT: 1 %
HCT: 27.9 % — ABNORMAL LOW (ref 40.0–52.0)
Hemoglobin: 9.7 g/dL — ABNORMAL LOW (ref 13.0–18.0)
LYMPHS ABS: 1 10*3/uL (ref 1.0–3.6)
Lymphocytes Relative: 9 %
MCH: 32.4 pg (ref 26.0–34.0)
MCHC: 34.6 g/dL (ref 32.0–36.0)
MCV: 93.7 fL (ref 80.0–100.0)
MONOS PCT: 8 %
Monocytes Absolute: 0.9 10*3/uL (ref 0.2–1.0)
Neutro Abs: 9 10*3/uL — ABNORMAL HIGH (ref 1.4–6.5)
Neutrophils Relative %: 82 %
PLATELETS: 207 10*3/uL (ref 150–440)
RBC: 2.98 MIL/uL — AB (ref 4.40–5.90)
RDW: 13.8 % (ref 11.5–14.5)
WBC: 11 10*3/uL — AB (ref 3.8–10.6)

## 2016-01-17 LAB — BASIC METABOLIC PANEL
Anion gap: 5 (ref 5–15)
BUN: 29 mg/dL — AB (ref 6–20)
CO2: 23 mmol/L (ref 22–32)
CREATININE: 1.13 mg/dL (ref 0.61–1.24)
Calcium: 8.7 mg/dL — ABNORMAL LOW (ref 8.9–10.3)
Chloride: 110 mmol/L (ref 101–111)
GFR, EST NON AFRICAN AMERICAN: 56 mL/min — AB (ref >60)
Glucose, Bld: 103 mg/dL — ABNORMAL HIGH (ref 65–99)
Potassium: 3.5 mmol/L (ref 3.5–5.1)
SODIUM: 138 mmol/L (ref 135–145)

## 2016-01-17 MED ORDER — SODIUM CHLORIDE 0.9 % IV SOLN
INTRAVENOUS | Status: DC
  Administered 2016-01-17 – 2016-01-18 (×3): via INTRAVENOUS

## 2016-01-17 MED ORDER — GABAPENTIN 300 MG PO CAPS
600.0000 mg | ORAL_CAPSULE | ORAL | Status: DC
  Administered 2016-01-17 – 2016-01-18 (×2): 600 mg via ORAL
  Filled 2016-01-17 (×2): qty 2

## 2016-01-17 MED ORDER — GABAPENTIN 300 MG PO CAPS
900.0000 mg | ORAL_CAPSULE | Freq: Every day | ORAL | Status: DC
  Administered 2016-01-17: 900 mg via ORAL
  Filled 2016-01-17: qty 3

## 2016-01-17 MED ORDER — MIRTAZAPINE 15 MG PO TABS
15.0000 mg | ORAL_TABLET | Freq: Every day | ORAL | Status: DC
  Administered 2016-01-17: 15 mg via ORAL
  Filled 2016-01-17: qty 1

## 2016-01-17 NOTE — Progress Notes (Signed)
Responded to Rapid Response. Assisted with getting patient back into bed. Pt's O2 saturations remained in the mid to high 90's the entire Rapid Response. Once patient was returned to bed, O2 was placed back on at 2LPM

## 2016-01-17 NOTE — Progress Notes (Signed)
Patient fell down this am and was called by RN taking care of the patient. Patient had a laceration over the fore head secondary to fall.Had bleeding from the wound. No loss of consciousness. CT head done showed no acute intracranial abnormality Aspirin and lovenox on hold.

## 2016-01-17 NOTE — Progress Notes (Addendum)
As I was walking out of another room, Lauren RN stopped me and stated she needed help because this patient was "on the floor" - this was an unwitnessed fall - I called Rapid Response and told NT to get a dynamap, I stayed at patient's side - pt was able to state his name, did not know where he was or what the month or year was and didn't know what happened or why he was getting up - pt was lying on his right side, head against the wall, with large amount of blood on the floor under his head - NT returned - BP was checked and pupils were assessed (reactive but sluggish) along with hand grip (moderate) and plantar movements (strong) - Rapid Response arrived and the hospitalist was paged - notified MD of incident and asked him to come and see patient - we did not move the patient at this time because we were unsure of the extent of any cervical or head injuries - BP was rechecked several times - MD arrived and assessed patient - cervical collar was placed before rolling patient onto his back and transferring to bed - MD gave orders for STAT head and cervical spine CTs and also gave orders to discontinue lovenox and asprin - pt was cleaned up and two RNs transferred patient to CT for scans.

## 2016-01-17 NOTE — Progress Notes (Signed)
[facility] [place] NAME: Lonnie [name]    MRN#:  [id number]  DATE OF BIRTH:  Oct 21, 1926  SUBJECTIVE:  Hospital Day: 7 days Lonnie [name] is a 82 y.o. male presenting with Influenza . , Shortness of breath  Overnight events: fall overnight Interval Events: actually off oxygen this morning, still some cough, daughter at bedside  REVIEW OF SYSTEMS:  CONSTITUTIONAL: No fever, positive fatigue and weakness.  EYES: No blurred or double vision.  EARS, NOSE, AND THROAT: No tinnitus or ear pain.  RESPIRATORY: Positive cough, shortness of breath, denies wheezing or hemoptysis.  CARDIOVASCULAR: No chest pain, orthopnea, edema.  GASTROINTESTINAL: No nausea, vomiting, diarrhea or abdominal pain.  GENITOURINARY: No dysuria, hematuria.  ENDOCRINE: No polyuria, nocturia,  HEMATOLOGY: No anemia, easy bruising or bleeding SKIN: No rash or lesion. MUSCULOSKELETAL: No joint pain or arthritis.   NEUROLOGIC: No tingling, numbness, weakness.  PSYCHIATRY: No anxiety or depression.   DRUG ALLERGIES:   Allergies  Allergen Reactions  . Sulfa Antibiotics Other (See Comments)    Not sure of reaction. It was too long ago, but he said not to give it to him.     VITALS:  Blood pressure 157/62, pulse 79, temperature 97.8 F (36.6 C), temperature source Oral, resp. rate 18, height 5\' 11"  (1.803 m), weight 82.918 kg (182 lb 12.8 oz), SpO2 92 %.  PHYSICAL EXAMINATION:  VITAL SIGNS: Filed Vitals:   01/17/16 1101 01/17/16 1148  BP: 160/63 157/62  Pulse: 80 79  Temp: 98.2 F (36.8 C) 97.8 F (36.6 C)  Resp: 16 18   GENERAL:82 y.o.male currently in moderate acute distress. Respiratory status/coughing HEAD: Normocephalic, atraumatic.  EYES: Pupils equal, round, reactive to light. Extraocular muscles intact. No scleral icterus.  MOUTH: Moist mucosal membrane. Dentition intact. No abscess noted.  EAR, NOSE, THROAT: Clear without exudates. No external  lesions.  NECK: Supple. No thyromegaly. No nodules. No JVD.  PULMONARY: Improved breath sounds without  wheeze. No use of accessory muscles, Good respiratory effort. good air entry bilaterally CHEST: Nontender to palpation.  CARDIOVASCULAR: S1 and S2. Regular rate and rhythm. No murmurs, rubs, or gallops. No edema. Pedal pulses 2+ bilaterally.  GASTROINTESTINAL: Soft, nontender, nondistended. No masses. Positive bowel sounds. No hepatosplenomegaly.  MUSCULOSKELETAL: No swelling, clubbing, or edema. Range of motion full in all extremities.  NEUROLOGIC: Cranial nerves II through XII are intact. No gross focal neurological deficits. Sensation intact. Reflexes intact.  SKIN: No ulceration, lesions, rashes, or cyanosis. Skin warm and dry. Turgor intact.  PSYCHIATRIC: Mood, affect within normal limits. The patient is awake, alert and oriented x 3. Insight, judgment intact.      LABORATORY PANEL:   CBC  Recent Labs Lab 01/17/16 0634  WBC 11.0*  HGB 9.7*  HCT 27.9*  PLT 207   ------------------------------------------------------------------------------------------------------------------  Chemistries   Recent Labs Lab 01/17/16 0634  NA 138  K 3.5  CL 110  CO2 23  GLUCOSE 103*  BUN 29*  CREATININE 1.13  CALCIUM 8.7*   ------------------------------------------------------------------------------------------------------------------  Cardiac Enzymes No results for input(s): TROPONINI in the last 168 hours. ------------------------------------------------------------------------------------------------------------------  RADIOLOGY:  Ct Head Wo Contrast  01/17/2016  CLINICAL DATA:  Status post unwitnessed fall. Frontal laceration. Concern for cervical spine injury. Initial encounter. EXAM: CT HEAD WITHOUT CONTRAST CT CERVICAL SPINE WITHOUT CONTRAST TECHNIQUE: Multidetector CT imaging of the head and cervical spine was performed following the standard protocol without intravenous  contrast. Multiplanar CT image reconstructions of the cervical spine  were also generated. COMPARISON:  CT of the head performed 01/10/2016, and MRI of the brain performed 04/25/2005 FINDINGS: CT HEAD FINDINGS There is no evidence of acute infarction, mass lesion, or intra- or extra-axial hemorrhage on CT. Prominence of the ventricles and sulci reflects mild cortical volume loss. Mild cerebellar atrophy is noted. Scattered periventricular white matter change likely reflects small vessel ischemic microangiopathy. Small chronic lacunar infarcts are noted at the basal ganglia bilaterally. The brainstem and fourth ventricle are within normal limits. The cerebral hemispheres demonstrate grossly normal gray-white differentiation. No mass effect or midline shift is seen. There is no evidence of fracture; visualized osseous structures are unremarkable in appearance. The orbits are within normal limits. The paranasal sinuses and mastoid air cells are well-aerated. Soft tissue swelling is noted overlying the frontal calvarium. CT CERVICAL SPINE FINDINGS There is no evidence of fracture or subluxation. Vertebral bodies demonstrate normal height and alignment. Intervertebral disc spaces are preserved. Prevertebral soft tissues are within normal limits. Scattered anterior and posterior disc osteophyte complexes are noted along the cervical spine. The thyroid gland is unremarkable in appearance. The visualized lung apices are clear. Mild calcification is noted at the carotid bifurcations bilaterally. IMPRESSION: 1. No evidence of traumatic intracranial injury or fracture. 2. No evidence of fracture or subluxation along the cervical spine. 3. Soft tissue swelling overlying the frontal calvarium. 4. Mild cortical volume loss and scattered small vessel ischemic microangiopathy. 5. Small chronic lacunar infarcts at the basal ganglia bilaterally. 6. Mild degenerative change noted along the lower cervical spine. 7. Mild calcification at  the carotid bifurcations bilaterally. Carotid ultrasound could be considered for further evaluation, when and as deemed clinically appropriate. Electronically Signed   By: Garald Balding M.D.   On: 01/17/2016 03:44   Ct Cervical Spine Wo Contrast  01/17/2016  CLINICAL DATA:  Status post unwitnessed fall. Frontal laceration. Concern for cervical spine injury. Initial encounter. EXAM: CT HEAD WITHOUT CONTRAST CT CERVICAL SPINE WITHOUT CONTRAST TECHNIQUE: Multidetector CT imaging of the head and cervical spine was performed following the standard protocol without intravenous contrast. Multiplanar CT image reconstructions of the cervical spine were also generated. COMPARISON:  CT of the head performed 01/10/2016, and MRI of the brain performed 04/25/2005 FINDINGS: CT HEAD FINDINGS There is no evidence of acute infarction, mass lesion, or intra- or extra-axial hemorrhage on CT. Prominence of the ventricles and sulci reflects mild cortical volume loss. Mild cerebellar atrophy is noted. Scattered periventricular white matter change likely reflects small vessel ischemic microangiopathy. Small chronic lacunar infarcts are noted at the basal ganglia bilaterally. The brainstem and fourth ventricle are within normal limits. The cerebral hemispheres demonstrate grossly normal gray-white differentiation. No mass effect or midline shift is seen. There is no evidence of fracture; visualized osseous structures are unremarkable in appearance. The orbits are within normal limits. The paranasal sinuses and mastoid air cells are well-aerated. Soft tissue swelling is noted overlying the frontal calvarium. CT CERVICAL SPINE FINDINGS There is no evidence of fracture or subluxation. Vertebral bodies demonstrate normal height and alignment. Intervertebral disc spaces are preserved. Prevertebral soft tissues are within normal limits. Scattered anterior and posterior disc osteophyte complexes are noted along the cervical spine. The thyroid  gland is unremarkable in appearance. The visualized lung apices are clear. Mild calcification is noted at the carotid bifurcations bilaterally. IMPRESSION: 1. No evidence of traumatic intracranial injury or fracture. 2. No evidence of fracture or subluxation along the cervical spine. 3. Soft tissue swelling overlying the frontal calvarium. 4. Mild  cortical volume loss and scattered small vessel ischemic microangiopathy. 5. Small chronic lacunar infarcts at the basal ganglia bilaterally. 6. Mild degenerative change noted along the lower cervical spine. 7. Mild calcification at the carotid bifurcations bilaterally. Carotid ultrasound could be considered for further evaluation, when and as deemed clinically appropriate. Electronically Signed   By: Garald Balding M.D.   On: 01/17/2016 03:44    EKG:   Orders placed or performed in visit on 09/29/12  . EKG 12-Lead    ASSESSMENT AND PLAN:   Lonnie [name] is a 82 y.o. male presenting with Influenza . Admitted 01/10/2016 : Day #: 45 days   82 year old male with past medical history of COPD/asbestosis, BPH, essential hypertension, coronary artery disease who presents to the hospital after generalized weakness and malaise and a recent fall and noted to have pneumonia.  1. Acute hypoxic respiratory failure-secondary to post influenza pneumonia , continue Zosyn #5/5-7 - duonebs, flutter valve, Mucinex- cough med Continue to wean oxygen - currently off  2. Generalized weakness/malaise/falls-secondary to pneumonia, PT 3. Essential hypertension-continue lisinopril.  4. Skin tears- painful, wound consult. From the fall  5. Neuropathy - held gabapentin 6. Constipation- meds added  7. DVT Prophylaxis- on lovenox 8. Poor appetite; in setting of medical illness and suspected depression: remeron  Disposition SNF   All the records are reviewed and case discussed with Care Management/Social Workerr. Management plans discussed with the patient, family and  they are in agreement.  CODE STATUS: full TOTAL TIME TAKING CARE OF THIS PATIENT: 28 minutes.   POSSIBLE D/C IN 1-2DAYS, DEPENDING ON CLINICAL CONDITION.   Lonnie [name],  [name] on 01/17/2016 at 12:31 PM  Between 7am to 6pm - Pager - [phone]  After 6pm: House Pager: - [phone]  [name]  Office  [phone]  CC: Primary care physician; No primary care provider on file.

## 2016-01-17 NOTE — Progress Notes (Signed)
Pt. Found on the floor by staff member approx 2:30. Pt had a laceration to anterior head. Rapid response was called. Pt was alert throughout the assessment and being taken down to CT scan. Olegario Shearer (daughter) was informed about the incident. Pt. stated that he did not know why he got up.

## 2016-01-17 NOTE — Plan of Care (Signed)
Problem: Health Behavior/Discharge Planning: Goal: Ability to manage health-related needs will improve Outcome: Not Progressing Pt will need assistance to manage health needs

## 2016-01-18 LAB — URINALYSIS COMPLETE WITH MICROSCOPIC (ARMC ONLY)
BILIRUBIN URINE: NEGATIVE
Bacteria, UA: NONE SEEN
GLUCOSE, UA: NEGATIVE mg/dL
HGB URINE DIPSTICK: NEGATIVE
KETONES UR: NEGATIVE mg/dL
LEUKOCYTES UA: NEGATIVE
NITRITE: NEGATIVE
PH: 6 (ref 5.0–8.0)
Protein, ur: NEGATIVE mg/dL
Specific Gravity, Urine: 1.015 (ref 1.005–1.030)
Squamous Epithelial / LPF: NONE SEEN

## 2016-01-18 LAB — CULTURE, BLOOD (ROUTINE X 2)
CULTURE: NO GROWTH
Culture: NO GROWTH

## 2016-01-18 MED ORDER — ENSURE ENLIVE PO LIQD: 237.0000 mL | Freq: Three times a day (TID) | ORAL | Status: DC

## 2016-01-18 MED ORDER — ZOLPIDEM TARTRATE 5 MG PO TABS: 5.0000 mg | ORAL_TABLET | Freq: Every evening | ORAL | Status: DC | PRN

## 2016-01-18 MED ORDER — GUAIFENESIN-CODEINE 100-10 MG/5ML PO SOLN: 10.0000 mL | ORAL | Status: DC | PRN

## 2016-01-18 MED ORDER — MIRTAZAPINE 15 MG PO TABS: 15.0000 mg | ORAL_TABLET | Freq: Every day | ORAL | Status: DC

## 2016-01-18 NOTE — Discharge Summary (Addendum)
[place] [facility] [place] NAME: Lonnie [name]    MR#:  [id number]  DATE OF BIRTH:  1926-12-22  DATE OF ADMISSION:  01/10/2016 ADMITTING PHYSICIAN: Henreitta Leber, MD  DATE OF DISCHARGE: 01/18/2016  PRIMARY CARE PHYSICIAN: No primary care provider on file.    ADMISSION DIAGNOSIS:  Community acquired pneumonia [J18.9]  DISCHARGE DIAGNOSIS:  Acute on chronic respiratory failure Community acquired pneumonia  SECONDARY DIAGNOSIS:   Past Medical History  Diagnosis Date  . COPD (chronic obstructive pulmonary disease) ([place])   . Hypertension   . Coronary artery disease   . Bundle branch block, left   . Renal disorder   . PSA elevation     HOSPITAL COURSE:  Lonnie [name]  is a 82 y.o. male admitted 01/10/2016 with chief complaint Influenza . Please see H&P performed by Henreitta Leber, MD for further information. Patient presented with the above symptoms and shortness of breath after recently having influenza. He has slowly been weaned off of oxygen during his stay. He has completed a full course of zosyn for pneumonia. PT evaluated patient recommended SNF.   DISCHARGE CONDITIONS:   stable  CONSULTS OBTAINED:  Treatment Team:  Erby Pian, MD Lytle Butte, MD  DRUG ALLERGIES:   Allergies  Allergen Reactions  . Sulfa Antibiotics Other (See Comments)    Not sure of reaction. It was too long ago, but he said not to give it to him.     DISCHARGE MEDICATIONS:   Current Discharge Medication List    START taking these medications   Details  feeding supplement, ENSURE ENLIVE, (ENSURE ENLIVE) LIQD Take 237 mLs by mouth 3 (three) times daily. Qty: 237 mL, Refills: 12    guaiFENesin-codeine 100-10 MG/5ML syrup Take 10 mLs by mouth every 4 (four) hours as needed for cough. Qty: 120 mL, Refills: 0      CONTINUE these medications which have NOT CHANGED   Details  alfuzosin (UROXATRAL) 10 MG 24 hr tablet Take 10 mg by mouth daily with  breakfast.    ascorbic acid (VITAMIN C) 1000 MG tablet Take 1,000 mg by mouth daily.    aspirin 81 MG tablet Take 81 mg by mouth daily.    atorvastatin (LIPITOR) 80 MG tablet Take 80 mg by mouth daily.    ferrous sulfate 325 (65 FE) MG tablet Take 325 mg by mouth daily with breakfast.    finasteride (PROSCAR) 5 MG tablet Take 5 mg by mouth daily.    Fluticasone-Salmeterol (ADVAIR) 250-50 MCG/DOSE AEPB Inhale 1 puff into the lungs 2 (two) times daily.    !! gabapentin (NEURONTIN) 300 MG capsule Take 600 mg by mouth every morning.     !! gabapentin (NEURONTIN) 300 MG capsule Take 900 mg by mouth every evening.    Ipratropium-Albuterol (COMBIVENT) 20-100 MCG/ACT AERS respimat Inhale 1 puff into the lungs 2 (two) times daily.    lisinopril (PRINIVIL,ZESTRIL) 40 MG tablet Take 40 mg by mouth daily.    magnesium oxide (MAG-OX) 400 MG tablet Take 400 mg by mouth daily.    omeprazole (PRILOSEC) 20 MG capsule Take 20 mg by mouth daily.    traMADol (ULTRAM) 50 MG tablet Take by mouth every 6 (six) hours as needed.    triamcinolone cream (KENALOG) 0.1 % Apply 1 application topically 2 (two) times daily.    verapamil (CALAN) 120 MG tablet Take 120 mg by mouth daily.     zolpidem (AMBIEN) 10 MG tablet  Take 10 mg by mouth at bedtime as needed for sleep.     !! - Potential duplicate medications found. Please discuss with provider.    STOP taking these medications     ipratropium-albuterol (DUONEB) 0.5-2.5 (3) MG/3ML SOLN          DISCHARGE INSTRUCTIONS:    DIET:  Cardiac diet  DISCHARGE CONDITION:  Stable  ACTIVITY:  Activity as tolerated  OXYGEN:  Home Oxygen: No.   Oxygen Delivery: room air  DISCHARGE LOCATION:  nursing home   If you experience worsening of your admission symptoms, develop shortness of breath, life threatening emergency, suicidal or homicidal thoughts you must seek medical attention immediately by calling 911 or calling your MD immediately  if  symptoms less severe.  You Must read complete instructions/literature along with all the possible adverse reactions/side effects for all the Medicines you take and that have been prescribed to you. Take any new Medicines after you have completely understood and accpet all the possible adverse reactions/side effects.   Please note  You were cared for by a hospitalist during your hospital stay. If you have any questions about your discharge medications or the care you received while you were in the hospital after you are discharged, you can call the unit and asked to speak with the hospitalist on call if the hospitalist that took care of you is not available. Once you are discharged, your primary care physician will handle any further medical issues. Please note that NO REFILLS for any discharge medications will be authorized once you are discharged, as it is imperative that you return to your primary care physician (or establish a relationship with a primary care physician if you do not have one) for your aftercare needs so that they can reassess your need for medications and monitor your lab values.    On the day of Discharge:   VITAL SIGNS:  Blood pressure 173/67, pulse 69, temperature 97.8 F (36.6 C), temperature source Oral, resp. rate 19, height 5\' 11"  (1.803 m), weight 82.918 kg (182 lb 12.8 oz), SpO2 92 %.  I/O:   Intake/Output Summary (Last 24 hours) at 01/18/16 1153 Last data filed at 01/18/16 1127  Gross per 24 hour  Intake 2308.67 ml  Output   1625 ml  Net 683.67 ml    PHYSICAL EXAMINATION:  GENERAL:  82 y.o.-year-old patient lying in the bed with no acute distress.  EYES: Pupils equal, round, reactive to light and accommodation. No scleral icterus. Extraocular muscles intact.  HEENT: Head atraumatic, normocephalic. Oropharynx and nasopharynx clear.  NECK:  Supple, no jugular venous distention. No thyroid enlargement, no tenderness.  LUNGS: Normal breath sounds bilaterally,  no wheezing, rales,rhonchi or crepitation. No use of accessory muscles of respiration.  CARDIOVASCULAR: S1, S2 normal. No murmurs, rubs, or gallops.  ABDOMEN: Soft, non-tender, non-distended. Bowel sounds present. No organomegaly or mass.  EXTREMITIES: No pedal edema, cyanosis, or clubbing.  NEUROLOGIC: Cranial nerves II through XII are intact. Muscle strength 5/5 in all extremities. Sensation intact. Gait not checked.  PSYCHIATRIC: The patient is alert and oriented x 3.  SKIN: No obvious rash, lesion, or ulcer.   DATA REVIEW:   CBC  Recent Labs Lab 01/17/16 0634  WBC 11.0*  HGB 9.7*  HCT 27.9*  PLT 207    Chemistries   Recent Labs Lab 01/17/16 0634  NA 138  K 3.5  CL 110  CO2 23  GLUCOSE 103*  BUN 29*  CREATININE 1.13  CALCIUM 8.7*  Cardiac Enzymes No results for input(s): TROPONINI in the last 168 hours.  Microbiology Results  Results for orders placed or performed during the hospital encounter of 01/10/16  Blood culture (routine x 2)     Status: None   Collection Time: 01/10/16  2:16 PM  Result Value Ref Range Status   Specimen Description BLOOD RIGHT ARM  Final   Special Requests BOTTLES DRAWN AEROBIC AND ANAEROBIC  3CC  Final   Culture NO GROWTH 8 DAYS  Final   Report Status 01/18/2016 FINAL  Final  Blood culture (routine x 2)     Status: None   Collection Time: 01/10/16  3:27 PM  Result Value Ref Range Status   Specimen Description BLOOD LEFT WRIST  Final   Special Requests BOTTLES DRAWN AEROBIC AND ANAEROBIC  1CC  Final   Culture NO GROWTH 8 DAYS  Final   Report Status 01/18/2016 FINAL  Final  MRSA PCR Screening     Status: None   Collection Time: 01/13/16  2:53 PM  Result Value Ref Range Status   MRSA by PCR NEGATIVE NEGATIVE Final    Comment:        The GeneXpert MRSA Assay (FDA approved for NASAL specimens only), is one component of a comprehensive MRSA colonization surveillance program. It is not intended to diagnose MRSA infection nor to  guide or monitor treatment for MRSA infections.     RADIOLOGY:  Ct Head Wo Contrast  01/17/2016  CLINICAL DATA:  Status post unwitnessed fall. Frontal laceration. Concern for cervical spine injury. Initial encounter. EXAM: CT HEAD WITHOUT CONTRAST CT CERVICAL SPINE WITHOUT CONTRAST TECHNIQUE: Multidetector CT imaging of the head and cervical spine was performed following the standard protocol without intravenous contrast. Multiplanar CT image reconstructions of the cervical spine were also generated. COMPARISON:  CT of the head performed 01/10/2016, and MRI of the brain performed 04/25/2005 FINDINGS: CT HEAD FINDINGS There is no evidence of acute infarction, mass lesion, or intra- or extra-axial hemorrhage on CT. Prominence of the ventricles and sulci reflects mild cortical volume loss. Mild cerebellar atrophy is noted. Scattered periventricular white matter change likely reflects small vessel ischemic microangiopathy. Small chronic lacunar infarcts are noted at the basal ganglia bilaterally. The brainstem and fourth ventricle are within normal limits. The cerebral hemispheres demonstrate grossly normal gray-white differentiation. No mass effect or midline shift is seen. There is no evidence of fracture; visualized osseous structures are unremarkable in appearance. The orbits are within normal limits. The paranasal sinuses and mastoid air cells are well-aerated. Soft tissue swelling is noted overlying the frontal calvarium. CT CERVICAL SPINE FINDINGS There is no evidence of fracture or subluxation. Vertebral bodies demonstrate normal height and alignment. Intervertebral disc spaces are preserved. Prevertebral soft tissues are within normal limits. Scattered anterior and posterior disc osteophyte complexes are noted along the cervical spine. The thyroid gland is unremarkable in appearance. The visualized lung apices are clear. Mild calcification is noted at the carotid bifurcations bilaterally. IMPRESSION: 1.  No evidence of traumatic intracranial injury or fracture. 2. No evidence of fracture or subluxation along the cervical spine. 3. Soft tissue swelling overlying the frontal calvarium. 4. Mild cortical volume loss and scattered small vessel ischemic microangiopathy. 5. Small chronic lacunar infarcts at the basal ganglia bilaterally. 6. Mild degenerative change noted along the lower cervical spine. 7. Mild calcification at the carotid bifurcations bilaterally. Carotid ultrasound could be considered for further evaluation, when and as deemed clinically appropriate. Electronically Signed   By: Garald Balding  M.D.   On: 01/17/2016 03:44   Ct Cervical Spine Wo Contrast  01/17/2016  CLINICAL DATA:  Status post unwitnessed fall. Frontal laceration. Concern for cervical spine injury. Initial encounter. EXAM: CT HEAD WITHOUT CONTRAST CT CERVICAL SPINE WITHOUT CONTRAST TECHNIQUE: Multidetector CT imaging of the head and cervical spine was performed following the standard protocol without intravenous contrast. Multiplanar CT image reconstructions of the cervical spine were also generated. COMPARISON:  CT of the head performed 01/10/2016, and MRI of the brain performed 04/25/2005 FINDINGS: CT HEAD FINDINGS There is no evidence of acute infarction, mass lesion, or intra- or extra-axial hemorrhage on CT. Prominence of the ventricles and sulci reflects mild cortical volume loss. Mild cerebellar atrophy is noted. Scattered periventricular white matter change likely reflects small vessel ischemic microangiopathy. Small chronic lacunar infarcts are noted at the basal ganglia bilaterally. The brainstem and fourth ventricle are within normal limits. The cerebral hemispheres demonstrate grossly normal gray-white differentiation. No mass effect or midline shift is seen. There is no evidence of fracture; visualized osseous structures are unremarkable in appearance. The orbits are within normal limits. The paranasal sinuses and mastoid  air cells are well-aerated. Soft tissue swelling is noted overlying the frontal calvarium. CT CERVICAL SPINE FINDINGS There is no evidence of fracture or subluxation. Vertebral bodies demonstrate normal height and alignment. Intervertebral disc spaces are preserved. Prevertebral soft tissues are within normal limits. Scattered anterior and posterior disc osteophyte complexes are noted along the cervical spine. The thyroid gland is unremarkable in appearance. The visualized lung apices are clear. Mild calcification is noted at the carotid bifurcations bilaterally. IMPRESSION: 1. No evidence of traumatic intracranial injury or fracture. 2. No evidence of fracture or subluxation along the cervical spine. 3. Soft tissue swelling overlying the frontal calvarium. 4. Mild cortical volume loss and scattered small vessel ischemic microangiopathy. 5. Small chronic lacunar infarcts at the basal ganglia bilaterally. 6. Mild degenerative change noted along the lower cervical spine. 7. Mild calcification at the carotid bifurcations bilaterally. Carotid ultrasound could be considered for further evaluation, when and as deemed clinically appropriate. Electronically Signed   By: Garald Balding M.D.   On: 01/17/2016 03:44     Management plans discussed with the patient, family and they are in agreement.  CODE STATUS:     Code Status Orders        Start     Ordered   01/10/16 2015  Full code   Continuous     01/10/16 2016    Code Status History    Date Active Date Inactive Code Status Order ID Comments User Context   This patient has a current code status but no historical code status.    Advance Directive Documentation        Most Recent Value   Type of Advance Directive  Healthcare Power of Attorney, Living will   Pre-existing out of facility DNR order (yellow form or pink MOST form)     "MOST" Form in Place?        TOTAL TIME TAKING CARE OF THIS PATIENT: 33 minutes.    Micaila Ziemba,  [name] on  01/18/2016 at 11:53 AM  Between 7am to 6pm - Pager - [phone]  After 6pm go to [email or web address] - Technical brewer [place] [facility]  Office  [phone]  CC: Primary care physician; No primary care provider on file.

## 2016-01-18 NOTE — Care Management Important Message (Signed)
Important Message  Patient Details  Name: Lonnie [name]. MRN: [id number] Date of Birth: 06/13/1927   Medicare Important Message Given:  Yes    Juliann Pulse A Fraser Busche 01/18/2016, 1:32 PM

## 2016-01-18 NOTE — Clinical Social Work Placement (Signed)
   CLINICAL SOCIAL WORK PLACEMENT  NOTE  Date:  01/18/2016  Patient Details  Name: Lonnie [name]. MRN: [id number] Date of Birth: [birth year]  Clinical Social Work is seeking post-discharge placement for this patient at the [place] level of care (*CSW will initial, date and re-position this form in  chart as items are completed):  Yes   Patient/family provided with [facility] Work Department's list of facilities offering this level of care within the geographic area requested by the patient (or if unable, by the patient's family).  Yes   Patient/family informed of their freedom to choose among providers that offer the needed level of care, that participate in Medicare, Medicaid or managed care program needed by the patient, have an available bed and are willing to accept the patient.  Yes   Patient/family informed of [facility]'s ownership interest in [facility] and [facility], as well as of the fact that they are under no obligation to receive care at these facilities.  PASRR submitted to EDS on 01/14/16     PASRR number received on       Existing PASRR number confirmed on       FL2 transmitted to all facilities in geographic area requested by pt/family on 01/14/16     FL2 transmitted to all facilities within larger geographic area on       Patient informed that his/her managed care company has contracts with or will negotiate with certain facilities, including the following:        Yes   Patient/family informed of bed offers received.  Patient chooses bed at  [facility])     Physician recommends and patient chooses bed at  [facility])    Patient to be transferred to  [facility]) on 01/18/16.  Patient to be transferred to facility by  (EMS)     Patient family notified on 01/18/16 of transfer.  Name of family member notified:  Patient's daughter     PHYSICIAN Please sign FL2     Additional Comment:     _______________________________________________ Shela Leff, LCSW 01/18/2016, 2:58 PM

## 2016-01-18 NOTE — Progress Notes (Signed)
Physical Therapy Treatment Patient Details Name: Lonnie [name]. MRN: [id number] DOB: 1927-09-11 Today's Date: 01/18/2016    History of Present Illness Pt is a 82 y.o. male with PMH of COPD, asbestos exposure, HTN, CAD, and neuropathy.  Pt presented with generalized weakness and a fall.  Pt was admitted for pneumonia.  Pt was intially placed on 2 L/min O2 and has recently been placed on 4 L/min O2 (both via nasal cannula)    PT Comments    Pt in bed asleep with daughter in room.  Pt very lethargic today.  Daughter reports laryngitis onset last night.  Pt able to mouth responses.  Participated in supine AAROM BLE's with poor active participation.  Needed max verbal cues to stay awake.  Session limited to in bed activities only for safety.  Daughter anticipating d/c to [facility] today for rehab.   Follow Up Recommendations  SNF     Equipment Recommendations       Recommendations for Other Services       Precautions / Restrictions Precautions Precautions: Fall Restrictions Weight Bearing Restrictions: No    Mobility  Bed Mobility                  Transfers                    Ambulation/Gait                 Stairs            Wheelchair Mobility    Modified Rankin (Stroke Patients Only)       Balance                                    Cognition   Behavior During Therapy: WFL for tasks assessed/performed Overall Cognitive Status: Within Functional Limits for tasks assessed                      Exercises Total Joint Exercises Ankle Circles/Pumps: Both;10 reps;AAROM General Exercises - Lower Extremity Short Arc Quad: AAROM;10 reps;Both Heel Slides: AAROM;Both;10 reps Hip ABduction/ADduction: AAROM;Both;10 reps Straight Leg Raises: AAROM;Both;10 reps    General Comments        Pertinent Vitals/Pain Pain Location: L arm and whole body Pain Descriptors / Indicators: Discomfort;Moaning    Home Living                       Prior Function            PT Goals (current goals can now be found in the care plan section) Acute Rehab PT Goals Patient Stated Goal: to go home     Frequency  Min 2X/week    PT Plan Current plan remains appropriate    Co-evaluation             End of Session   Activity Tolerance: Patient limited by lethargy Patient left: in bed;with call bell/phone within reach;with family/visitor present;with bed alarm set     Time: [id number] PT Time Calculation (min) (ACUTE ONLY): 24 min  Charges:  $Therapeutic Activity: 23-37 mins                    G CodesChesley Noon, PTA 01/18/2016, 11:09 AM

## 2016-01-18 NOTE — Care Management Important Message (Signed)
Important Message  Patient Details  Name: Lonnie [name]. MRN: [id number] Date of Birth: 05-01-1927   Medicare Important Message Given:  Yes    Juliann Pulse A Emre Stock 01/18/2016, 1:31 PM

## 2016-01-18 NOTE — Progress Notes (Signed)
Report called to Gara Kroner, RN at [facility]. RN to call EMS. Daughter at bedside updated.

## 2016-01-18 NOTE — Clinical Social Work Note (Signed)
Patient to discharge to [facility] today. Discharge information sent earlier today. Had to wait for Admission's Coordinator to get to office this afternoon in order to get room and report number for nurse. Patient's daughter aware of discharge and requests transport via EMS. Shela Leff MSW,LCSW [phone]

## 2016-01-19 DIAGNOSIS — G479 Sleep disorder, unspecified: Secondary | ICD-10-CM

## 2016-01-19 DIAGNOSIS — G629 Polyneuropathy, unspecified: Secondary | ICD-10-CM

## 2016-01-19 DIAGNOSIS — J189 Pneumonia, unspecified organism: Secondary | ICD-10-CM | POA: Diagnosis not present

## 2016-01-19 DIAGNOSIS — J439 Emphysema, unspecified: Secondary | ICD-10-CM | POA: Diagnosis not present

## 2016-01-19 DIAGNOSIS — N400 Enlarged prostate without lower urinary tract symptoms: Secondary | ICD-10-CM | POA: Diagnosis not present

## 2016-01-19 DIAGNOSIS — I10 Essential (primary) hypertension: Secondary | ICD-10-CM | POA: Diagnosis not present

## 2016-01-20 NOTE — Care Management (Signed)
Post discharge: faxed discharge summary to [facility] at [facility].

## 2017-07-21 ENCOUNTER — Encounter: Payer: Self-pay | Admitting: Emergency Medicine

## 2017-07-21 ENCOUNTER — Emergency Department
Admission: EM | Admit: 2017-07-21 | Discharge: 2017-07-21 | Disposition: A | Discharge status: Home / Self Care | Payer: Medicare Other | Attending: Student in an Organized Health Care Education/Training Program | Admitting: Student in an Organized Health Care Education/Training Program

## 2017-07-21 DIAGNOSIS — Z79899 Other long term (current) drug therapy: Secondary | ICD-10-CM | POA: Insufficient documentation

## 2017-07-21 DIAGNOSIS — J449 Chronic obstructive pulmonary disease, unspecified: Secondary | ICD-10-CM | POA: Diagnosis not present

## 2017-07-21 DIAGNOSIS — I10 Essential (primary) hypertension: Secondary | ICD-10-CM | POA: Insufficient documentation

## 2017-07-21 DIAGNOSIS — Z87891 Personal history of nicotine dependence: Secondary | ICD-10-CM | POA: Diagnosis not present

## 2017-07-21 DIAGNOSIS — I2510 Atherosclerotic heart disease of native coronary artery without angina pectoris: Secondary | ICD-10-CM | POA: Insufficient documentation

## 2017-07-21 DIAGNOSIS — R197 Diarrhea, unspecified: Secondary | ICD-10-CM | POA: Diagnosis present

## 2017-07-21 LAB — CBC WITH DIFFERENTIAL/PLATELET
Basophils Absolute: 0 10*3/uL (ref 0–0.1)
Basophils Relative: 1 %
EOS PCT: 0 %
Eosinophils Absolute: 0 10*3/uL (ref 0–0.7)
HCT: 33.9 % — ABNORMAL LOW (ref 40.0–52.0)
HEMOGLOBIN: 11.5 g/dL — AB (ref 13.0–18.0)
LYMPHS ABS: 0.8 10*3/uL — AB (ref 1.0–3.6)
LYMPHS PCT: 11 %
MCH: 33 pg (ref 26.0–34.0)
MCHC: 34 g/dL (ref 32.0–36.0)
MCV: 96.9 fL (ref 80.0–100.0)
Monocytes Absolute: 0.5 10*3/uL (ref 0.2–1.0)
Monocytes Relative: 7 %
Neutro Abs: 6 10*3/uL (ref 1.4–6.5)
Neutrophils Relative %: 81 %
Platelets: 148 10*3/uL — ABNORMAL LOW (ref 150–440)
RBC: 3.5 MIL/uL — AB (ref 4.40–5.90)
RDW: 14.3 % (ref 11.5–14.5)
WBC: 7.4 10*3/uL (ref 3.8–10.6)

## 2017-07-21 LAB — COMPREHENSIVE METABOLIC PANEL
ALT: 18 U/L (ref 17–63)
AST: 23 U/L (ref 15–41)
Albumin: 3.6 g/dL (ref 3.5–5.0)
Alkaline Phosphatase: 64 U/L (ref 38–126)
Anion gap: 9 (ref 5–15)
BUN: 14 mg/dL (ref 6–20)
CHLORIDE: 98 mmol/L — AB (ref 101–111)
CO2: 24 mmol/L (ref 22–32)
Calcium: 9.1 mg/dL (ref 8.9–10.3)
Creatinine, Ser: 1.05 mg/dL (ref 0.61–1.24)
GFR calc non Af Amer: >60 mL/min (ref >60)
Glucose, Bld: 105 mg/dL — ABNORMAL HIGH (ref 65–99)
POTASSIUM: 3.9 mmol/L (ref 3.5–5.1)
Sodium: 131 mmol/L — ABNORMAL LOW (ref 135–145)
Total Bilirubin: 0.7 mg/dL (ref 0.3–1.2)
Total Protein: 6.2 g/dL — ABNORMAL LOW (ref 6.5–8.1)

## 2017-07-21 LAB — INFLUENZA PANEL BY PCR (TYPE A & B)
INFLAPCR: NEGATIVE
INFLBPCR: NEGATIVE

## 2017-07-21 LAB — GASTROINTESTINAL PANEL BY PCR, STOOL (REPLACES STOOL CULTURE)

## 2017-07-21 LAB — C DIFFICILE QUICK SCREEN W PCR REFLEX
C DIFFICILE (CDIFF) TOXIN: NEGATIVE
C Diff antigen: NEGATIVE
C Diff interpretation: NOT DETECTED

## 2017-07-21 MED ORDER — SODIUM CHLORIDE 0.9 % IV BOLUS (SEPSIS)
1000.0000 mL | Freq: Once | INTRAVENOUS | Status: AC
  Administered 2017-07-21: 1000 mL via INTRAVENOUS

## 2017-07-21 MED ORDER — TRAMADOL HCL 50 MG PO TABS
50.0000 mg | ORAL_TABLET | Freq: Once | ORAL | Status: AC
  Administered 2017-07-21: 50 mg via ORAL
  Filled 2017-07-21: qty 1

## 2017-07-21 NOTE — ED Provider Notes (Signed)
[facility] Emergency Department Provider Note    First MD Initiated Contact with Patient 07/21/17 [phone]     (approximate)  I have reviewed the triage vital signs and the nursing notes.   HISTORY  Chief Complaint Diarrhea    HPI Lonnie A Jakie Debow. is a 83 y.o. male a history of COPD as well as CAD presents with several days of watery diarrhea where he is having 4-5 episodes daily.  States that his blood is always melanotic because he takes supplemental iron.  There is been no change in his stool color.  Denied any nausea or vomiting.  Cord EMS today because he was feeling more weak than usual.  Blood pressure and vitals are otherwise stable.  He is tolerating oral hydration.  No recent antibiotics.  Past Medical History:  Diagnosis Date  . Bundle branch block, left   . COPD (chronic obstructive pulmonary disease) ([place])   . Coronary artery disease   . Hypertension   . PSA elevation   . Renal disorder    Family History  Problem Relation Age of Onset  . Heart attack Mother   . Heart attack Father    Past Surgical History:  Procedure Laterality Date  . APPENDECTOMY    . RECTAL SURGERY     x3   Patient Active Problem List   Diagnosis Date Noted  . Pneumonia 01/10/2016      Prior to Admission medications   Medication Sig Start Date End Date Taking? Authorizing Provider  alfuzosin (UROXATRAL) 10 MG 24 hr tablet Take 10 mg by mouth daily with breakfast.    [provider]  ascorbic acid (VITAMIN C) 1000 MG tablet Take 1,000 mg by mouth daily.    [provider]  aspirin 81 MG tablet Take 81 mg by mouth daily.    [provider]  atorvastatin (LIPITOR) 80 MG tablet Take 80 mg by mouth daily.    [provider]  feeding supplement, ENSURE ENLIVE, (ENSURE ENLIVE) LIQD Take 237 mLs by mouth 3 (three) times daily. 01/18/16   Hower, Aaron Mose, MD  ferrous sulfate 325 (65 FE) MG tablet Take 325 mg by mouth daily with  breakfast.    [provider]  finasteride (PROSCAR) 5 MG tablet Take 5 mg by mouth daily.    [provider]  Fluticasone-Salmeterol (ADVAIR) 250-50 MCG/DOSE AEPB Inhale 1 puff into the lungs 2 (two) times daily.    [provider]  gabapentin (NEURONTIN) 300 MG capsule Take 600 mg by mouth every morning.     [provider]  gabapentin (NEURONTIN) 300 MG capsule Take 900 mg by mouth every evening.    [provider]  guaiFENesin-codeine 100-10 MG/5ML syrup Take 10 mLs by mouth every 4 (four) hours as needed for cough. 01/18/16   Hower, Aaron Mose, MD  Ipratropium-Albuterol (COMBIVENT) 20-100 MCG/ACT AERS respimat Inhale 1 puff into the lungs 2 (two) times daily.    [provider]  lisinopril (PRINIVIL,ZESTRIL) 40 MG tablet Take 40 mg by mouth daily.    [provider]  magnesium oxide (MAG-OX) 400 MG tablet Take 400 mg by mouth daily.    [provider]  omeprazole (PRILOSEC) 20 MG capsule Take 20 mg by mouth daily.    [provider]  traMADol (ULTRAM) 50 MG tablet Take by mouth every 6 (six) hours as needed.    [provider]  triamcinolone cream (KENALOG) 0.1 % Apply 1 application topically 2 (two)  times daily.    [provider]  verapamil (CALAN) 120 MG tablet Take 120 mg by mouth daily.     [provider]  zolpidem (AMBIEN) 10 MG tablet Take 10 mg by mouth at bedtime as needed for sleep.    [provider]    Allergies Sulfa antibiotics    Social History Social History  Substance Use Topics  . Smoking status: Former Smoker    Packs/day: 2.00    Years: 50.00    Types: Cigarettes  . Smokeless tobacco: Not on file  . Alcohol use 0.0 oz/week    Review of Systems Patient denies headaches, rhinorrhea, blurry vision, numbness, shortness of breath, chest pain, edema, cough, abdominal pain, nausea, vomiting, diarrhea, dysuria, fevers, rashes or hallucinations unless  otherwise stated above in HPI. ____________________________________________   PHYSICAL EXAM:  VITAL SIGNS: Vitals:   07/21/17 1300 07/21/17 1400  BP: (!) 175/82 (!) 184/80  Pulse: 78 74  Resp:    Temp:    SpO2: 96% 99%    Constitutional: Alert and oriented. Well appearing and in no acute distress. Eyes: Conjunctivae are normal.  Head: Atraumatic. Nose: No congestion/rhinnorhea. Mouth/Throat: Mucous membranes are moist.   Neck: No stridor. Painless ROM.  Cardiovascular: Normal rate, regular rhythm. Grossly normal heart sounds.  Good peripheral circulation. Respiratory: Normal respiratory effort.  No retractions. Lungs CTAB. Gastrointestinal: Soft and nontender. No distention. No abdominal bruits. No CVA tenderness. Genitourinary:  Musculoskeletal: No lower extremity tenderness nor edema.  No joint effusions. Neurologic:  Normal speech and language. No gross focal neurologic deficits are appreciated. No facial droop Skin:  Skin is warm, dry and intact. No rash noted. Psychiatric: Mood and affect are normal. Speech and behavior are normal.  ____________________________________________   LABS (all labs ordered are listed, but only abnormal results are displayed)  Results for orders placed or performed during the hospital encounter of 07/21/17 (from the past 24 hour(s))  CBC with Differential/Platelet     Status: Abnormal   Collection Time: 07/21/17 10:10 AM  Result Value Ref Range   WBC 7.4 3.8 - 10.6 K/uL   RBC 3.50 (L) 4.40 - 5.90 MIL/uL   Hemoglobin 11.5 (L) 13.0 - 18.0 g/dL   HCT 33.9 (L) 40.0 - 52.0 %   MCV 96.9 80.0 - 100.0 fL   MCH 33.0 26.0 - 34.0 pg   MCHC 34.0 32.0 - 36.0 g/dL   RDW 14.3 11.5 - 14.5 %   Platelets 148 (L) 150 - 440 K/uL   Neutrophils Relative % 81 %   Neutro Abs 6.0 1.4 - 6.5 K/uL   Lymphocytes Relative 11 %   Lymphs Abs 0.8 (L) 1.0 - 3.6 K/uL   Monocytes Relative 7 %   Monocytes Absolute 0.5 0.2 - 1.0 K/uL   Eosinophils Relative 0 %    Eosinophils Absolute 0.0 0 - 0.7 K/uL   Basophils Relative 1 %   Basophils Absolute 0.0 0 - 0.1 K/uL  Comprehensive metabolic panel     Status: Abnormal   Collection Time: 07/21/17 10:10 AM  Result Value Ref Range   Sodium 131 (L) 135 - 145 mmol/L   Potassium 3.9 3.5 - 5.1 mmol/L   Chloride 98 (L) 101 - 111 mmol/L   CO2 24 22 - 32 mmol/L   Glucose, Bld 105 (H) 65 - 99 mg/dL   BUN 14 6 - 20 mg/dL   Creatinine, Ser 1.05 0.61 - 1.24 mg/dL   Calcium 9.1 8.9 - 10.3 mg/dL  Total Protein 6.2 (L) 6.5 - 8.1 g/dL   Albumin 3.6 3.5 - 5.0 g/dL   AST 23 15 - 41 U/L   ALT 18 17 - 63 U/L   Alkaline Phosphatase 64 38 - 126 U/L   Total Bilirubin 0.7 0.3 - 1.2 mg/dL   GFR calc non Af Amer >60 >60 mL/min   GFR calc Af Amer >60 >60 mL/min   Anion gap 9 5 - 15  C difficile quick scan w PCR reflex     Status: None   Collection Time: 07/21/17 11:12 AM  Result Value Ref Range   C Diff antigen NEGATIVE NEGATIVE   C Diff toxin NEGATIVE NEGATIVE   C Diff interpretation No C. difficile detected.   Gastrointestinal Panel by PCR , Stool     Status: None   Collection Time: 07/21/17 11:12 AM  Result Value Ref Range   Campylobacter species NOT DETECTED NOT DETECTED   Plesimonas shigelloides NOT DETECTED NOT DETECTED   Salmonella species NOT DETECTED NOT DETECTED   Yersinia enterocolitica NOT DETECTED NOT DETECTED   Vibrio species NOT DETECTED NOT DETECTED   Vibrio cholerae NOT DETECTED NOT DETECTED   Enteroaggregative E coli (EAEC) NOT DETECTED NOT DETECTED   Enteropathogenic E coli (EPEC) NOT DETECTED NOT DETECTED   Enterotoxigenic E coli (ETEC) NOT DETECTED NOT DETECTED   Shiga like toxin producing E coli (STEC) NOT DETECTED NOT DETECTED   Shigella/Enteroinvasive E coli (EIEC) NOT DETECTED NOT DETECTED   Cryptosporidium NOT DETECTED NOT DETECTED   Cyclospora cayetanensis NOT DETECTED NOT DETECTED   Entamoeba histolytica NOT DETECTED NOT DETECTED   Giardia lamblia NOT DETECTED NOT DETECTED    Adenovirus F40/41 NOT DETECTED NOT DETECTED   Astrovirus NOT DETECTED NOT DETECTED   Norovirus GI/GII NOT DETECTED NOT DETECTED   Rotavirus A NOT DETECTED NOT DETECTED   Sapovirus (I, II, IV, and V) NOT DETECTED NOT DETECTED  Influenza panel by PCR (type A & B)     Status: None   Collection Time: 07/21/17  1:12 PM  Result Value Ref Range   Influenza A By PCR NEGATIVE NEGATIVE   Influenza B By PCR NEGATIVE NEGATIVE   ____________________________________________ ____________________________________________   PROCEDURES  Procedure(s) performed:  Procedures    Critical Care performed: no ____________________________________________   INITIAL IMPRESSION / ASSESSMENT AND PLAN / ED COURSE  Pertinent labs & imaging results that were available during my care of the patient were reviewed by me and considered in my medical decision making (see chart for details).  DDX: dehydration, colitis, enteritis, electroylte abn,  cdif  Lonnie A Griffey Nicasio. is a 83 y.o. who presents to the ED with report of diarrhea and weakness.  Abdominal exam is soft and benign.  He is afebrile and hemo-dynamically stable.  Patient is otherwise well-appearing.  Will order blood work for the above differential and provide IV fluids.  Clinical Course as of Jul 21 1524  Fri Jul 21, 2017  1431 Patient ambulating with a steady gait.  No acute distress.  Blood work has been reassuring and the patient has not had any episodes of watery diarrhea since being here.  I do believe that he is stable for further workup as an outpatient.  [PR]    Clinical Course User Index [PR] Merlyn Lot, MD     ____________________________________________   FINAL CLINICAL IMPRESSION(S) / ED DIAGNOSES  Final diagnoses:  Diarrhea, unspecified type      NEW MEDICATIONS STARTED DURING THIS VISIT:  Discharge Medication  List as of 07/21/2017  2:51 PM       Note:  This document was prepared using Dragon voice recognition  software and may include unintentional dictation errors.    Merlyn Lot, MD 07/21/17 [phone]

## 2017-07-21 NOTE — ED Notes (Signed)
Pt assisted to toilet for bowel movement. Pt instructed to pull call bell when done.

## 2017-07-21 NOTE — ED Triage Notes (Signed)
Pt arrived via EMS from home for reports of diarrhea and weakness for four days. Denies vomiting, denies abdominal pain. EMS reports 187/85, 98% RA, HR 77, CBG 97. Pt alert and oriented on arrival.

## 2017-08-04 ENCOUNTER — Ambulatory Visit (INDEPENDENT_AMBULATORY_CARE_PROVIDER_SITE_OTHER): Payer: Medicare Other | Admitting: Urology

## 2017-08-04 ENCOUNTER — Encounter: Payer: Self-pay | Admitting: Urology

## 2017-08-04 VITALS — BP 134/68 | HR 89 | Ht 71.0 in | Wt 179.6 lb

## 2017-08-04 DIAGNOSIS — N183 Chronic kidney disease, stage 3 unspecified: Secondary | ICD-10-CM

## 2017-08-04 DIAGNOSIS — N138 Other obstructive and reflux uropathy: Secondary | ICD-10-CM | POA: Diagnosis not present

## 2017-08-04 DIAGNOSIS — N401 Enlarged prostate with lower urinary tract symptoms: Secondary | ICD-10-CM

## 2017-08-04 DIAGNOSIS — R339 Retention of urine, unspecified: Secondary | ICD-10-CM | POA: Diagnosis not present

## 2017-08-04 DIAGNOSIS — R350 Frequency of micturition: Secondary | ICD-10-CM

## 2017-08-04 LAB — URINALYSIS, COMPLETE
BILIRUBIN UA: NEGATIVE
GLUCOSE, UA: NEGATIVE
KETONES UA: NEGATIVE
Leukocytes, UA: NEGATIVE
NITRITE UA: NEGATIVE
Protein, UA: NEGATIVE
RBC UA: NEGATIVE
SPEC GRAV UA: 1.01 (ref 1.005–1.030)
UUROB: 0.2 mg/dL (ref 0.2–1.0)
pH, UA: 7 (ref 5.0–7.5)

## 2017-08-04 LAB — BLADDER SCAN AMB NON-IMAGING

## 2017-08-04 NOTE — Progress Notes (Signed)
08/04/2017 3:06 PM   Lonnie [name]. 11/13/1926 [id number]  Referring provider: Derinda Late, MD [phone] [address] [facility] [place], [place] [address]  Chief Complaint  Patient presents with  . Urinary Incontinence    HPI: 83 year old male presents for annual follow-up.  He was last seen by me at [facility] in October 2017.  He had an episode of urinary retention in April 2016 which resolved after a period of Foley catheter drainage.  He remains on alfuzosin and finasteride and states 3 months ago he noted worsening lower urinary tract symptoms.  He complains of urinary frequency, urgency with occasional episodes of urge incontinence.  He has urinary hesitancy, straining to urinate and an intermittent urinary stream.  He also notes nighttime incontinence.  He has nocturia x2.  Denies dysuria or gross hematuria.  Denies flank, abdominal, pelvic or scrotal pain.   PMH: Past Medical History:  Diagnosis Date  . Bundle branch block, left   . COPD (chronic obstructive pulmonary disease) ([place])   . Coronary artery disease   . Hypertension   . PSA elevation   . Renal disorder     Surgical History: Past Surgical History:  Procedure Laterality Date  . APPENDECTOMY    . RECTAL SURGERY     x3    Home Medications:  Allergies as of 08/04/2017      Reactions   Sulfa Antibiotics Other (See Comments)   Not sure of reaction. It was too long ago, but he said not to give it to him.       Medication List       Accurate as of 08/04/17  3:06 PM. Always use your most recent med list.          alfuzosin 10 MG 24 hr tablet Commonly known as:  UROXATRAL Take 10 mg by mouth daily with breakfast.   ascorbic acid 1000 MG tablet Commonly known as:  VITAMIN C Take 1,000 mg by mouth daily.   aspirin 81 MG tablet Take 81 mg by mouth daily.   atorvastatin 80 MG tablet Commonly known as:  LIPITOR Take 80 mg by mouth daily.   feeding supplement (ENSURE  ENLIVE) Liqd Take 237 mLs by mouth 3 (three) times daily.   ferrous sulfate 325 (65 FE) MG tablet Take 325 mg by mouth daily with breakfast.   finasteride 5 MG tablet Commonly known as:  PROSCAR Take 5 mg by mouth daily.   Fluticasone-Salmeterol 250-50 MCG/DOSE Aepb Commonly known as:  ADVAIR Inhale 1 puff into the lungs 2 (two) times daily.   gabapentin 300 MG capsule Commonly known as:  NEURONTIN Take 600 mg by mouth every morning.   gabapentin 300 MG capsule Commonly known as:  NEURONTIN Take 900 mg by mouth every evening.   guaiFENesin-codeine 100-10 MG/5ML syrup Take 10 mLs by mouth every 4 (four) hours as needed for cough.   Ipratropium-Albuterol 20-100 MCG/ACT Aers respimat Commonly known as:  COMBIVENT Inhale 1 puff into the lungs 2 (two) times daily.   lisinopril 40 MG tablet Commonly known as:  PRINIVIL,ZESTRIL Take 40 mg by mouth daily.   magnesium oxide 400 MG tablet Commonly known as:  MAG-OX Take 400 mg by mouth daily.   omeprazole 20 MG capsule Commonly known as:  PRILOSEC Take 20 mg by mouth daily.   traMADol 50 MG tablet Commonly known as:  ULTRAM Take by mouth every 6 (six) hours as needed.   triamcinolone cream 0.1 %  Commonly known as:  KENALOG Apply 1 application topically 2 (two) times daily.   verapamil 120 MG tablet Commonly known as:  CALAN Take 120 mg by mouth daily.   zolpidem 10 MG tablet Commonly known as:  AMBIEN Take 10 mg by mouth at bedtime as needed for sleep.       Allergies:  Allergies  Allergen Reactions  . Sulfa Antibiotics Other (See Comments)    Not sure of reaction. It was too long ago, but he said not to give it to him.     Family History: Family History  Problem Relation Age of Onset  . Heart attack Mother   . Heart attack Father     Social History:  reports that he has quit smoking. His smoking use included Cigarettes. He has a 100.00 pack-year smoking history. He does not have any smokeless tobacco  history on file. He reports that he drinks alcohol. He reports that he does not use drugs.  ROS: UROLOGY Frequent Urination?: Yes Hard to postpone urination?: Yes Burning/pain with urination?: No Get up at night to urinate?: Yes Leakage of urine?: No Urine stream starts and stops?: Yes Trouble starting stream?: Yes Do you have to strain to urinate?: Yes Blood in urine?: No Urinary tract infection?: No Sexually transmitted disease?: No Injury to kidneys or bladder?: No Painful intercourse?: No Weak stream?: No Erection problems?: No Penile pain?: No  Gastrointestinal Nausea?: No Vomiting?: No Indigestion/heartburn?: No Diarrhea?: No Constipation?: Yes  Constitutional Fever: No Night sweats?: No Weight loss?: No Fatigue?: Yes  Skin Skin rash/lesions?: Yes Itching?: No  Eyes Blurred vision?: No Double vision?: No  Ears/Nose/Throat Sore throat?: No Sinus problems?: No  Hematologic/Lymphatic Swollen glands?: No Easy bruising?: No  Cardiovascular Leg swelling?: No Chest pain?: No  Respiratory Cough?: No Shortness of breath?: No  Endocrine Excessive thirst?: No  Musculoskeletal Back pain?: No Joint pain?: No  Neurological Headaches?: No Dizziness?: No  Psychologic Depression?: No Anxiety?: No  Physical Exam: BP 134/68 (BP Location: Right Arm, Patient Position: Sitting, Cuff Size: Normal)   Pulse 89   Ht 5\' 11"  (1.803 m)   Wt 179 lb 9.6 oz (81.5 kg)   BMI 25.05 kg/m   Constitutional:  Alert and oriented, No acute distress. HEENT: [place] AT, moist mucus membranes.  Trachea midline, no masses. Cardiovascular: No clubbing, cyanosis, or edema. Respiratory: Normal respiratory effort, no increased work of breathing. GI: Abdomen is soft, nontender, nondistended, no abdominal masses GU: No CVA tenderness.  Prostate 40 g, smooth without nodules Skin: No rashes, bruises or suspicious lesions. Lymph: No cervical or inguinal adenopathy. Neurologic:  Grossly intact, no focal deficits, moving all 4 extremities. Psychiatric: Normal mood and affect.  Laboratory Data: Lab Results  Component Value Date   WBC 7.4 07/21/2017   HGB 11.5 (L) 07/21/2017   HCT 33.9 (L) 07/21/2017   MCV 96.9 07/21/2017   PLT 148 (L) 07/21/2017    Lab Results  Component Value Date   CREATININE 1.05 07/21/2017    Urinalysis Dipstick/microscopy without significant abnormalities   Assessment & Plan:    1. BPH with obstruction/lower urinary tract symptoms PVR by bladder scan today higher at 260 mL.  He is on combination therapy.  Surgically and minimally invasive options were discussed.  He is not ready to pursue at this point.  I also discussed starting intermittent catheterization which he would like to think over.  Will schedule a renal ultrasound to evaluate his upper tracts.  Follow-up after ultrasound and possible  [facility] teaching.  - Urinalysis, Complete - Bladder Scan (Post Void Residual) in office  2. Incomplete bladder emptying As above   3. Stage 3 chronic kidney disease ([place])  - Ultrasound renal complete; Future    Abbie Sons, [place] [address], [place], [place] [address] [phone]

## 2017-08-09 DIAGNOSIS — N400 Enlarged prostate without lower urinary tract symptoms: Secondary | ICD-10-CM | POA: Insufficient documentation

## 2017-08-09 DIAGNOSIS — N183 Chronic kidney disease, stage 3 unspecified: Secondary | ICD-10-CM | POA: Insufficient documentation

## 2017-08-09 DIAGNOSIS — N138 Other obstructive and reflux uropathy: Secondary | ICD-10-CM | POA: Insufficient documentation

## 2017-08-09 DIAGNOSIS — N401 Enlarged prostate with lower urinary tract symptoms: Secondary | ICD-10-CM | POA: Insufficient documentation

## 2017-08-09 DIAGNOSIS — R339 Retention of urine, unspecified: Secondary | ICD-10-CM | POA: Insufficient documentation

## 2017-08-28 ENCOUNTER — Ambulatory Visit
Admission: RE | Admit: 2017-08-28 | Discharge: 2017-08-28 | Disposition: A | Discharge status: Home / Self Care | Payer: Medicare Other | Source: Ambulatory Visit | Attending: Urology | Admitting: Urology

## 2017-08-28 DIAGNOSIS — N183 Chronic kidney disease, stage 3 unspecified: Secondary | ICD-10-CM

## 2017-08-31 ENCOUNTER — Ambulatory Visit (INDEPENDENT_AMBULATORY_CARE_PROVIDER_SITE_OTHER): Payer: Medicare Other | Admitting: Urology

## 2017-08-31 ENCOUNTER — Encounter: Payer: Self-pay | Admitting: Urology

## 2017-08-31 VITALS — BP 139/71 | HR 98 | Ht 70.0 in | Wt 181.0 lb

## 2017-08-31 DIAGNOSIS — N401 Enlarged prostate with lower urinary tract symptoms: Secondary | ICD-10-CM

## 2017-08-31 DIAGNOSIS — N138 Other obstructive and reflux uropathy: Secondary | ICD-10-CM

## 2017-08-31 NOTE — Progress Notes (Signed)
08/31/2017 2:08 PM   Lonnie [name]. 03/16/1927 [id number]  Referring provider: Derinda Late, MD [phone] [address] [facility] [place], [place] [address]  Chief Complaint  Patient presents with  . Follow-up    HPI: 83 year old male presents for renal ultrasound results.  Refer to my prior office note of 08/04/2017.  He states he is feeling much better and feels he is voiding back to baseline.  His renal ultrasound showed no hydronephrosis or significant bladder distention.   PMH: Past Medical History:  Diagnosis Date  . Bundle branch block, left   . COPD (chronic obstructive pulmonary disease) ([place])   . Coronary artery disease   . Hypertension   . PSA elevation   . Renal disorder     Surgical History: Past Surgical History:  Procedure Laterality Date  . APPENDECTOMY    . RECTAL SURGERY     x3    Home Medications:  Allergies as of 08/31/2017      Reactions   Sulfa Antibiotics Other (See Comments)   Not sure of reaction. It was too long ago, but he said not to give it to him.       Medication List        Accurate as of 08/31/17  2:08 PM. Always use your most recent med list.          alfuzosin 10 MG 24 hr tablet Commonly known as:  UROXATRAL Take 10 mg by mouth daily with breakfast.   ascorbic acid 1000 MG tablet Commonly known as:  VITAMIN C Take 1,000 mg by mouth daily.   aspirin 81 MG tablet Take 81 mg by mouth daily.   atorvastatin 80 MG tablet Commonly known as:  LIPITOR Take 80 mg by mouth daily.   feeding supplement (ENSURE ENLIVE) Liqd Take 237 mLs by mouth 3 (three) times daily.   ferrous sulfate 325 (65 FE) MG tablet Take 325 mg by mouth daily with breakfast.   finasteride 5 MG tablet Commonly known as:  PROSCAR Take 5 mg by mouth daily.   Fluticasone-Salmeterol 250-50 MCG/DOSE Aepb Commonly known as:  ADVAIR Inhale 1 puff into the lungs 2 (two) times daily.   gabapentin 300 MG  capsule Commonly known as:  NEURONTIN Take 600 mg by mouth every morning.   gabapentin 300 MG capsule Commonly known as:  NEURONTIN Take 900 mg by mouth every evening.   guaiFENesin-codeine 100-10 MG/5ML syrup Take 10 mLs by mouth every 4 (four) hours as needed for cough.   Ipratropium-Albuterol 20-100 MCG/ACT Aers respimat Commonly known as:  COMBIVENT Inhale 1 puff into the lungs 2 (two) times daily.   lisinopril 40 MG tablet Commonly known as:  PRINIVIL,ZESTRIL Take 40 mg by mouth daily.   magnesium oxide 400 MG tablet Commonly known as:  MAG-OX Take 400 mg by mouth daily.   omeprazole 20 MG capsule Commonly known as:  PRILOSEC Take 20 mg by mouth daily.   traMADol 50 MG tablet Commonly known as:  ULTRAM Take by mouth every 6 (six) hours as needed.   triamcinolone cream 0.1 % Commonly known as:  KENALOG Apply 1 application topically 2 (two) times daily.   verapamil 120 MG tablet Commonly known as:  CALAN Take 120 mg by mouth daily.   zolpidem 10 MG tablet Commonly known as:  AMBIEN Take 10 mg by mouth at bedtime as needed for sleep.       Allergies:  Allergies  Allergen Reactions  . Sulfa Antibiotics Other (See Comments)    Not sure of reaction. It was too long ago, but he said not to give it to him.     Family History: Family History  Problem Relation Age of Onset  . Heart attack Mother   . Heart attack Father     Social History:  reports that he has quit smoking. His smoking use included cigarettes. He has a 100.00 pack-year smoking history. he has never used smokeless tobacco. He reports that he drinks alcohol. He reports that he does not use drugs.  ROS: UROLOGY Frequent Urination?: Yes Hard to postpone urination?: Yes Burning/pain with urination?: No Get up at night to urinate?: Yes Leakage of urine?: No Urine stream starts and stops?: Yes Trouble starting stream?: No Do you have to strain to urinate?: No Blood in urine?: No Urinary  tract infection?: No Sexually transmitted disease?: No Injury to kidneys or bladder?: No Painful intercourse?: No Weak stream?: No Erection problems?: No Penile pain?: No  Gastrointestinal Nausea?: No Vomiting?: No Indigestion/heartburn?: No Diarrhea?: No Constipation?: Yes  Constitutional Fever: No Night sweats?: No Weight loss?: No Fatigue?: No  Skin Skin rash/lesions?: Yes Itching?: No  Eyes Blurred vision?: No Double vision?: No  Ears/Nose/Throat Sore throat?: No Sinus problems?: No  Hematologic/Lymphatic Swollen glands?: No Easy bruising?: No  Cardiovascular Leg swelling?: No Chest pain?: No  Respiratory Cough?: Yes Shortness of breath?: Yes  Endocrine Excessive thirst?: No  Musculoskeletal Back pain?: No Joint pain?: No  Neurological Headaches?: No Dizziness?: No  Psychologic Depression?: No Anxiety?: No  Physical Exam: BP 139/71   Pulse 98   Ht 5\' 10"  (1.778 m)   Wt 181 lb (82.1 kg)   BMI 25.97 kg/m   Constitutional:  Alert and oriented, No acute distress. HEENT: [place] AT, moist mucus membranes.  Trachea midline, no masses. Cardiovascular: No clubbing, cyanosis, or edema. Respiratory: Normal respiratory effort, no increased work of breathing. GI: Abdomen is soft, nontender, nondistended, no abdominal masses GU: No CVA tenderness.  Skin: No rashes, bruises or suspicious lesions. Lymph: No cervical or inguinal adenopathy. Neurologic: Grossly intact, no focal deficits, moving all 4 extremities. Psychiatric: Normal mood and affect.  Laboratory Data: Lab Results  Component Value Date   WBC 7.4 07/21/2017   HGB 11.5 (L) 07/21/2017   HCT 33.9 (L) 07/21/2017   MCV 96.9 07/21/2017   PLT 148 (L) 07/21/2017    Lab Results  Component Value Date   CREATININE 1.05 07/21/2017    Pertinent Imaging:  Results for orders placed during the hospital encounter of 08/28/17  Ultrasound renal complete   Narrative CLINICAL DATA:  Stage III  chronic renal disease.  EXAM: RENAL / URINARY TRACT ULTRASOUND COMPLETE  COMPARISON:  None.  FINDINGS: Right Kidney:  Length: 9.2 cm. Echogenicity within normal limits. No mass or hydronephrosis visualized.  Left Kidney:  Length: 10.9 cm. Echogenicity within normal limits. No mass or hydronephrosis visualized.  Bladder:  Appears normal for degree of bladder distention.  IMPRESSION: No acute abnormalities to explain renal disease. Both kidneys are mildly lobulated with no focal mass. No hydronephrosis.   Electronically Signed   By: Dorise Bullion III M.D   On: 08/28/2017 16:26     Assessment & Plan:   He feels he is doing much better and voiding at baseline.  He has previously refused surgical management and does not desire to start intermittent catheterization as previously discussed.  We did discuss the potential for further bladder decompensation  and worsening chronic kidney disease.   Return in about 6 months (around 02/28/2018) for Recheck, Bladder scan.  Abbie Sons, [place] [address], [place], [place] [address] [phone]

## 2017-09-19 ENCOUNTER — Other Ambulatory Visit: Payer: Self-pay | Admitting: Specialist

## 2017-09-19 DIAGNOSIS — R918 Other nonspecific abnormal finding of lung field: Secondary | ICD-10-CM

## 2017-09-19 DIAGNOSIS — Z8709 Personal history of other diseases of the respiratory system: Secondary | ICD-10-CM

## 2017-09-25 ENCOUNTER — Ambulatory Visit
Admission: RE | Admit: 2017-09-25 | Discharge: 2017-09-25 | Disposition: A | Discharge status: Home / Self Care | Payer: Medicare Other | Source: Ambulatory Visit | Attending: Specialist | Admitting: Specialist

## 2017-09-25 DIAGNOSIS — J439 Emphysema, unspecified: Secondary | ICD-10-CM | POA: Diagnosis not present

## 2017-09-25 DIAGNOSIS — R911 Solitary pulmonary nodule: Secondary | ICD-10-CM | POA: Diagnosis not present

## 2017-09-25 DIAGNOSIS — R918 Other nonspecific abnormal finding of lung field: Secondary | ICD-10-CM

## 2017-09-25 DIAGNOSIS — Z8709 Personal history of other diseases of the respiratory system: Secondary | ICD-10-CM | POA: Diagnosis not present

## 2017-09-25 DIAGNOSIS — J61 Pneumoconiosis due to asbestos and other mineral fibers: Secondary | ICD-10-CM | POA: Diagnosis not present

## 2017-09-25 DIAGNOSIS — I700 Atherosclerosis of aorta: Secondary | ICD-10-CM | POA: Insufficient documentation

## 2017-10-11 ENCOUNTER — Other Ambulatory Visit: Payer: Self-pay | Admitting: Specialist

## 2017-10-11 DIAGNOSIS — R911 Solitary pulmonary nodule: Secondary | ICD-10-CM

## 2018-01-19 ENCOUNTER — Ambulatory Visit
Admission: RE | Admit: 2018-01-19 | Discharge: 2018-01-19 | Disposition: A | Discharge status: Home / Self Care | Payer: Medicare Other | Source: Ambulatory Visit | Attending: Specialist | Admitting: Specialist

## 2018-01-19 DIAGNOSIS — I700 Atherosclerosis of aorta: Secondary | ICD-10-CM | POA: Diagnosis not present

## 2018-01-19 DIAGNOSIS — J432 Centrilobular emphysema: Secondary | ICD-10-CM | POA: Diagnosis not present

## 2018-01-19 DIAGNOSIS — I2510 Atherosclerotic heart disease of native coronary artery without angina pectoris: Secondary | ICD-10-CM | POA: Diagnosis not present

## 2018-01-19 DIAGNOSIS — R911 Solitary pulmonary nodule: Secondary | ICD-10-CM | POA: Diagnosis not present

## 2018-01-19 DIAGNOSIS — J929 Pleural plaque without asbestos: Secondary | ICD-10-CM | POA: Insufficient documentation

## 2018-02-28 ENCOUNTER — Ambulatory Visit (INDEPENDENT_AMBULATORY_CARE_PROVIDER_SITE_OTHER): Payer: Medicare Other | Admitting: Urology

## 2018-02-28 ENCOUNTER — Encounter: Payer: Self-pay | Admitting: Urology

## 2018-02-28 VITALS — BP 167/63 | HR 78 | Ht 70.0 in | Wt 181.3 lb

## 2018-02-28 DIAGNOSIS — N401 Enlarged prostate with lower urinary tract symptoms: Secondary | ICD-10-CM | POA: Diagnosis not present

## 2018-02-28 DIAGNOSIS — R339 Retention of urine, unspecified: Secondary | ICD-10-CM | POA: Diagnosis not present

## 2018-02-28 DIAGNOSIS — N138 Other obstructive and reflux uropathy: Secondary | ICD-10-CM | POA: Diagnosis not present

## 2018-02-28 LAB — BLADDER SCAN AMB NON-IMAGING

## 2018-02-28 NOTE — Progress Notes (Signed)
02/28/2018 2:42 PM   Lonnie [name] Estanislado Emms. 1926/11/19 [id number]  Referring provider: Derinda Late, MD [phone] [address] [facility] [place], [place] [address]  Chief Complaint  Patient presents with  . Follow-up    HPI: 84 year old male with BPH and an incomplete bladder emptying with a history of urinary retention.  Refer to my previous note of 08/31/2017.  He has stable voiding symptoms of frequency, nocturia x3, urinary hesitancy and a weak urinary stream.  He denies dysuria or gross hematuria.  He remains on alfuzosin and finasteride.  He is reconsidering outlet surgery.   PMH: Past Medical History:  Diagnosis Date  . Bundle branch block, left   . COPD (chronic obstructive pulmonary disease) ([place])   . Coronary artery disease   . Hypertension   . PSA elevation   . Renal disorder     Surgical History: Past Surgical History:  Procedure Laterality Date  . APPENDECTOMY    . RECTAL SURGERY     x3    Home Medications:  Allergies as of 02/28/2018      Reactions   Sulfa Antibiotics Other (See Comments)   Not sure of reaction. It was too long ago, but he said not to give it to him.    Sulfasalazine    Other reaction(s): Other (See Comments) Not sure of reaction. It was too long ago, but he said not to give it to him.       Medication List        Accurate as of 02/28/18  2:42 PM. Always use your most recent med list.          alfuzosin 10 MG 24 hr tablet Commonly known as:  UROXATRAL Take 10 mg by mouth daily with breakfast.   ascorbic acid 1000 MG tablet Commonly known as:  VITAMIN C Take 1,000 mg by mouth daily.   aspirin 81 MG tablet Take 81 mg by mouth daily.   atorvastatin 80 MG tablet Commonly known as:  LIPITOR Take 80 mg by mouth daily.   feeding supplement (ENSURE ENLIVE) Liqd Take 237 mLs by mouth 3 (three) times daily.   ferrous sulfate 325 (65 FE) MG tablet Take 325 mg by mouth daily with breakfast.     finasteride 5 MG tablet Commonly known as:  PROSCAR Take 5 mg by mouth daily.   Fluticasone-Salmeterol 250-50 MCG/DOSE Aepb Commonly known as:  ADVAIR Inhale 1 puff into the lungs 2 (two) times daily.   gabapentin 300 MG capsule Commonly known as:  NEURONTIN Take 900 mg by mouth every evening.   Ipratropium-Albuterol 20-100 MCG/ACT Aers respimat Commonly known as:  COMBIVENT Inhale 1 puff into the lungs 2 (two) times daily.   lisinopril 40 MG tablet Commonly known as:  PRINIVIL,ZESTRIL Take 40 mg by mouth daily.   magnesium oxide 400 MG tablet Commonly known as:  MAG-OX Take 400 mg by mouth daily.   Melatonin 5 MG Tabs Take by mouth.   MULTI-VITAMINS Tabs Take by mouth.   omeprazole 20 MG capsule Commonly known as:  PRILOSEC Take 20 mg by mouth daily.   SYMBICORT 160-4.5 MCG/ACT inhaler Generic drug:  budesonide-formoterol Inhale into the lungs.   traMADol 50 MG tablet Commonly known as:  ULTRAM Take by mouth every 6 (six) hours as needed.   triamcinolone cream 0.1 % Commonly known as:  KENALOG Apply 1 application topically 2 (two) times daily.   verapamil 120 MG tablet Commonly known as:  CALAN Take 120 mg by mouth daily.   zolpidem 10 MG tablet Commonly known as:  AMBIEN Take 10 mg by mouth at bedtime as needed for sleep.       Allergies:  Allergies  Allergen Reactions  . Sulfa Antibiotics Other (See Comments)    Not sure of reaction. It was too long ago, but he said not to give it to him.   . Sulfasalazine     Other reaction(s): Other (See Comments) Not sure of reaction. It was too long ago, but he said not to give it to him.     Family History: Family History  Problem Relation Age of Onset  . Heart attack Mother   . Heart attack Father     Social History:  reports that he has quit smoking. His smoking use included cigarettes. He has a 100.00 pack-year smoking history. He has never used smokeless tobacco. He reports that he drinks  alcohol. He reports that he does not use drugs.  ROS: UROLOGY Frequent Urination?: Yes Hard to postpone urination?: No Burning/pain with urination?: No Get up at night to urinate?: Yes Leakage of urine?: Yes Urine stream starts and stops?: No Trouble starting stream?: Yes Do you have to strain to urinate?: No Blood in urine?: No Urinary tract infection?: No Sexually transmitted disease?: No Injury to kidneys or bladder?: No Painful intercourse?: No Weak stream?: Yes Erection problems?: No Penile pain?: No  Gastrointestinal Nausea?: No Vomiting?: No Indigestion/heartburn?: No Diarrhea?: No Constipation?: No  Constitutional Fever: No Night sweats?: No Weight loss?: No Fatigue?: No  Skin Skin rash/lesions?: No Itching?: No  Eyes Blurred vision?: No Double vision?: No  Ears/Nose/Throat Sore throat?: No Sinus problems?: No  Hematologic/Lymphatic Swollen glands?: No Easy bruising?: No  Cardiovascular Leg swelling?: No Chest pain?: No  Respiratory Cough?: No Shortness of breath?: No  Endocrine Excessive thirst?: No  Musculoskeletal Back pain?: No Joint pain?: No  Neurological Headaches?: No Dizziness?: No  Psychologic Depression?: No Anxiety?: No  Physical Exam: BP (!) 167/63 (BP Location: Right Arm, Patient Position: Sitting, Cuff Size: Normal)   Pulse 78   Ht 5\' 10"  (1.778 m)   Wt 181 lb 4.8 oz (82.2 kg)   SpO2 99%   BMI 26.01 kg/m   Constitutional:  Alert and oriented, No acute distress. HEENT: [place] AT, moist mucus membranes.  Trachea midline, no masses. Cardiovascular: No clubbing, cyanosis, or edema. Respiratory: Normal respiratory effort, no increased work of breathing. GI: Abdomen is soft, nontender, nondistended, no abdominal masses GU: No CVA tenderness Lymph: No cervical or inguinal lymphadenopathy. Skin: No rashes, bruises or suspicious lesions. Neurologic: Grossly intact, no focal deficits, moving all 4  extremities. Psychiatric: Normal mood and affect.   Assessment & Plan:   He could not give a voided specimen today and estimated volume by bladder scan was 600 mL.  He declined replacement of a Foley catheter but stated if he was not successful in voiding he will call back.  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  Based on his age and comorbidities I have recommended scheduling a urodynamic study.  A cystoscopy will also be scheduled.   Abbie Sons, [place] [address], [place], [place] [address] [phone]

## 2018-03-05 ENCOUNTER — Telehealth: Payer: Self-pay | Admitting: Urology

## 2018-03-05 NOTE — Telephone Encounter (Signed)
Lonnie [name], Pt daughter asking for Dr. Bernardo Heater or his assistant to call her, she has questions about her father's last visit with [facility] and what his plan of care, next steps are and why was he "referred" to [place].  Please call Lonnie [name] @ [phone]

## 2018-03-06 NOTE — Telephone Encounter (Signed)
lmom for Lonnie [name] to call office

## 2018-03-07 NOTE — Telephone Encounter (Signed)
Spoke w/ [name] and explained to her that pt was not being referred to a new doctor, but being referred there for the imaging. She states her understanding and will make sure pt is at his Urodynamics study.

## 2018-03-22 ENCOUNTER — Other Ambulatory Visit: Payer: Medicare Other | Admitting: Urology

## 2018-03-28 ENCOUNTER — Other Ambulatory Visit: Payer: Self-pay | Admitting: Urology

## 2018-04-11 ENCOUNTER — Ambulatory Visit: Payer: Medicare Other | Admitting: Urology

## 2018-04-17 ENCOUNTER — Encounter: Payer: Self-pay | Admitting: Urology

## 2018-04-17 ENCOUNTER — Ambulatory Visit (INDEPENDENT_AMBULATORY_CARE_PROVIDER_SITE_OTHER): Payer: Medicare Other | Admitting: Urology

## 2018-04-17 VITALS — BP 148/79 | HR 78 | Resp 16 | Ht 71.0 in | Wt 177.8 lb

## 2018-04-17 DIAGNOSIS — N138 Other obstructive and reflux uropathy: Secondary | ICD-10-CM | POA: Diagnosis not present

## 2018-04-17 DIAGNOSIS — N401 Enlarged prostate with lower urinary tract symptoms: Secondary | ICD-10-CM

## 2018-04-17 LAB — URINALYSIS, COMPLETE
BILIRUBIN UA: NEGATIVE
Glucose, UA: NEGATIVE
Ketones, UA: NEGATIVE
Leukocytes, UA: NEGATIVE
NITRITE UA: NEGATIVE
Protein, UA: NEGATIVE
RBC UA: NEGATIVE
SPEC GRAV UA: 1.02 (ref 1.005–1.030)
Urobilinogen, Ur: 0.2 mg/dL (ref 0.2–1.0)
pH, UA: 7 (ref 5.0–7.5)

## 2018-04-18 ENCOUNTER — Encounter: Payer: Self-pay | Admitting: Urology

## 2018-04-18 NOTE — Progress Notes (Signed)
04/17/2018 9:02 AM   Lonnie [name]. June 26, 1927 [id number]  Referring provider: Derinda Late, MD [phone] [address] [facility] [place], [place] [address]  Chief Complaint  Patient presents with  . Cysto    HPI: 84 year old male with bothersome lower urinary tract symptoms and incomplete bladder emptying presents for follow-up of a recent perform urodynamic study and cystoscopy.  He complains of urinary frequency, urgency, hesitancy, intermittent urinary stream.  Urodynamic study was performed at [facility] on 03/27/2018.  His first sensation occurred at 632 mL, normal desire at 689 mL.  He voided 134 mL and was able to generate a voluntary contraction.  PVR was approximately 714 mL.  Maximum detrusor pressure and voiding study was 46 cm of water.  Bladder capacity was 850 mL.   PMH: Past Medical History:  Diagnosis Date  . Bundle branch block, left   . COPD (chronic obstructive pulmonary disease) ([place])   . Coronary artery disease   . Hypertension   . PSA elevation   . Renal disorder     Surgical History: Past Surgical History:  Procedure Laterality Date  . APPENDECTOMY    . RECTAL SURGERY     x3    Home Medications:  Allergies as of 04/17/2018      Reactions   Sulfa Antibiotics Other (See Comments)   Not sure of reaction. It was too long ago, but he said not to give it to him.    Sulfasalazine    Other reaction(s): Other (See Comments) Not sure of reaction. It was too long ago, but he said not to give it to him.       Medication List        Accurate as of 04/17/18 11:59 PM. Always use your most recent med list.          alfuzosin 10 MG 24 hr tablet Commonly known as:  UROXATRAL Take 10 mg by mouth daily with breakfast.   ascorbic acid 1000 MG tablet Commonly known as:  VITAMIN C Take 1,000 mg by mouth daily.   aspirin 81 MG tablet Take 81 mg by mouth daily.   atorvastatin 80 MG tablet Commonly known as:   LIPITOR Take 80 mg by mouth daily.   feeding supplement (ENSURE ENLIVE) Liqd Take 237 mLs by mouth 3 (three) times daily.   ferrous sulfate 325 (65 FE) MG tablet Take 325 mg by mouth daily with breakfast.   finasteride 5 MG tablet Commonly known as:  PROSCAR Take 5 mg by mouth daily.   Fluticasone-Salmeterol 250-50 MCG/DOSE Aepb Commonly known as:  ADVAIR Inhale 1 puff into the lungs 2 (two) times daily.   gabapentin 300 MG capsule Commonly known as:  NEURONTIN Take 900 mg by mouth every evening.   Ipratropium-Albuterol 20-100 MCG/ACT Aers respimat Commonly known as:  COMBIVENT Inhale 1 puff into the lungs 2 (two) times daily.   lisinopril 40 MG tablet Commonly known as:  PRINIVIL,ZESTRIL Take 40 mg by mouth daily.   magnesium oxide 400 MG tablet Commonly known as:  MAG-OX Take 400 mg by mouth daily.   Melatonin 5 MG Tabs Take by mouth.   MULTI-VITAMINS Tabs Take by mouth.   omeprazole 20 MG capsule Commonly known as:  PRILOSEC Take 20 mg by mouth daily.   SYMBICORT 160-4.5 MCG/ACT inhaler Generic drug:  budesonide-formoterol Inhale into the lungs.   traMADol 50 MG tablet Commonly known as:  ULTRAM Take by mouth every 6 (six)  hours as needed.   triamcinolone cream 0.1 % Commonly known as:  KENALOG Apply 1 application topically 2 (two) times daily.   verapamil 120 MG tablet Commonly known as:  CALAN Take 120 mg by mouth daily.   zolpidem 10 MG tablet Commonly known as:  AMBIEN Take 10 mg by mouth at bedtime as needed for sleep.       Allergies:  Allergies  Allergen Reactions  . Sulfa Antibiotics Other (See Comments)    Not sure of reaction. It was too long ago, but he said not to give it to him.   . Sulfasalazine     Other reaction(s): Other (See Comments) Not sure of reaction. It was too long ago, but he said not to give it to him.     Family History: Family History  Problem Relation Age of Onset  . Heart attack Mother   . Heart attack  Father     Social History:  reports that he has quit smoking. His smoking use included cigarettes. He has a 100.00 pack-year smoking history. He has never used smokeless tobacco. He reports that he drinks alcohol. He reports that he does not use drugs.  ROS: UROLOGY Frequent Urination?: Yes Hard to postpone urination?: Yes Burning/pain with urination?: No Get up at night to urinate?: Yes Leakage of urine?: Yes Urine stream starts and stops?: Yes Trouble starting stream?: Yes Do you have to strain to urinate?: No Blood in urine?: No Urinary tract infection?: No Sexually transmitted disease?: No Injury to kidneys or bladder?: No Painful intercourse?: No Weak stream?: Yes Erection problems?: Yes Penile pain?: No  Gastrointestinal Nausea?: No Vomiting?: No Indigestion/heartburn?: No Diarrhea?: Yes Constipation?: Yes  Constitutional Fever: No Night sweats?: No Weight loss?: No Fatigue?: No  Skin Skin rash/lesions?: Yes Itching?: No  Eyes Blurred vision?: Yes Double vision?: No  Ears/Nose/Throat Sore throat?: No Sinus problems?: No  Hematologic/Lymphatic Swollen glands?: No Easy bruising?: No  Cardiovascular Leg swelling?: No Chest pain?: No  Respiratory Cough?: No Shortness of breath?: No  Endocrine Excessive thirst?: No  Musculoskeletal Back pain?: No Joint pain?: No  Neurological Headaches?: No Dizziness?: No  Psychologic Depression?: No Anxiety?: No  Physical Exam: BP (!) 148/79   Pulse 78   Resp 16   Ht 5\' 11"  (1.803 m)   Wt 177 lb 12.8 oz (80.6 kg)   SpO2 96%   BMI 24.80 kg/m   Constitutional:  Alert and oriented, No acute distress. HEENT: [place] AT, moist mucus membranes.  Trachea midline, no masses. Cardiovascular: No clubbing, cyanosis, or edema. Respiratory: Normal respiratory effort, no increased work of breathing. GI: Abdomen is soft, nontender, nondistended, no abdominal masses GU: No CVA tenderness Lymph: No cervical or  inguinal lymphadenopathy. Skin: No rashes, bruises or suspicious lesions. Neurologic: Grossly intact, no focal deficits, moving all 4 extremities. Psychiatric: Normal mood and affect.     Assessment & Plan:   Lonnie [name] presented today with his daughter.  He declined cystoscopy.  He does have evidence of obstruction on urodynamic study however has a hypersensitive bladder with first sensation at 630 mL.  We discussed outlet surgery and the possibility he may not be able to empty his bladder based on this finding.  He states he is not interested in TURP or even minimally invasive procedure such as UroLift.  I did discuss intermittent catheterization which he was interested in doing initially.  He will return at a later date for a nurse visit and instruction in intermittent catheterization.  Abbie Sons, [place] [address] [place], [place] [address] [phone]

## 2018-04-25 ENCOUNTER — Ambulatory Visit (INDEPENDENT_AMBULATORY_CARE_PROVIDER_SITE_OTHER): Payer: Medicare Other

## 2018-04-25 ENCOUNTER — Ambulatory Visit: Payer: Medicare Other

## 2018-04-25 DIAGNOSIS — N401 Enlarged prostate with lower urinary tract symptoms: Secondary | ICD-10-CM

## 2018-04-25 DIAGNOSIS — N138 Other obstructive and reflux uropathy: Secondary | ICD-10-CM | POA: Diagnosis not present

## 2018-04-25 NOTE — Progress Notes (Signed)
Pt present in office today to learn how to self cath himself. Pt was provided information and demonstration via [employer] video. Pt was then instructed hands on using a coude flex pro. Pt successfully catheterized himself, 500cc of urine was noted in urinal. Pt instructed to cath himself bid.

## 2018-04-29 ENCOUNTER — Encounter: Payer: Self-pay | Admitting: Emergency Medicine

## 2018-04-29 ENCOUNTER — Emergency Department
Admission: EM | Admit: 2018-04-29 | Discharge: 2018-04-29 | Disposition: A | Discharge status: Home / Self Care | Payer: Non-veteran care | Attending: Emergency Medicine | Admitting: Emergency Medicine

## 2018-04-29 DIAGNOSIS — R319 Hematuria, unspecified: Secondary | ICD-10-CM | POA: Insufficient documentation

## 2018-04-29 DIAGNOSIS — R300 Dysuria: Secondary | ICD-10-CM | POA: Diagnosis present

## 2018-04-29 DIAGNOSIS — R339 Retention of urine, unspecified: Secondary | ICD-10-CM | POA: Diagnosis not present

## 2018-04-29 DIAGNOSIS — J449 Chronic obstructive pulmonary disease, unspecified: Secondary | ICD-10-CM | POA: Insufficient documentation

## 2018-04-29 DIAGNOSIS — E869 Volume depletion, unspecified: Secondary | ICD-10-CM | POA: Diagnosis not present

## 2018-04-29 DIAGNOSIS — N183 Chronic kidney disease, stage 3 (moderate): Secondary | ICD-10-CM | POA: Diagnosis not present

## 2018-04-29 DIAGNOSIS — I129 Hypertensive chronic kidney disease with stage 1 through stage 4 chronic kidney disease, or unspecified chronic kidney disease: Secondary | ICD-10-CM | POA: Diagnosis not present

## 2018-04-29 DIAGNOSIS — Z87891 Personal history of nicotine dependence: Secondary | ICD-10-CM | POA: Diagnosis not present

## 2018-04-29 DIAGNOSIS — Z79899 Other long term (current) drug therapy: Secondary | ICD-10-CM | POA: Insufficient documentation

## 2018-04-29 DIAGNOSIS — Z7982 Long term (current) use of aspirin: Secondary | ICD-10-CM | POA: Diagnosis not present

## 2018-04-29 DIAGNOSIS — N390 Urinary tract infection, site not specified: Secondary | ICD-10-CM | POA: Diagnosis not present

## 2018-04-29 DIAGNOSIS — I2510 Atherosclerotic heart disease of native coronary artery without angina pectoris: Secondary | ICD-10-CM | POA: Insufficient documentation

## 2018-04-29 LAB — COMPREHENSIVE METABOLIC PANEL
ALK PHOS: 62 U/L (ref 38–126)
ALT: 21 U/L (ref 0–44)
AST: 26 U/L (ref 15–41)
Albumin: 3.8 g/dL (ref 3.5–5.0)
Anion gap: 7 (ref 5–15)
BILIRUBIN TOTAL: 0.9 mg/dL (ref 0.3–1.2)
BUN: 23 mg/dL (ref 8–23)
CALCIUM: 9.7 mg/dL (ref 8.9–10.3)
CO2: 29 mmol/L (ref 22–32)
Chloride: 100 mmol/L (ref 98–111)
Creatinine, Ser: 1.34 mg/dL — ABNORMAL HIGH (ref 0.61–1.24)
GFR, EST AFRICAN AMERICAN: 52 mL/min — AB (ref >60)
GFR, EST NON AFRICAN AMERICAN: 45 mL/min — AB (ref >60)
Glucose, Bld: 111 mg/dL — ABNORMAL HIGH (ref 70–99)
Potassium: 4 mmol/L (ref 3.5–5.1)
Sodium: 136 mmol/L (ref 135–145)
Total Protein: 6.8 g/dL (ref 6.5–8.1)

## 2018-04-29 LAB — URINALYSIS, COMPLETE (UACMP) WITH MICROSCOPIC
Bacteria, UA: NONE SEEN
Bilirubin Urine: NEGATIVE
GLUCOSE, UA: NEGATIVE mg/dL
Ketones, ur: 5 mg/dL — AB
NITRITE: NEGATIVE
PH: 7 (ref 5.0–8.0)
Protein, ur: 100 mg/dL — AB
Specific Gravity, Urine: 1.011 (ref 1.005–1.030)
Squamous Epithelial / HPF: NONE SEEN (ref 0–5)
WBC, UA: >50 WBC/hpf — ABNORMAL HIGH (ref 0–5)

## 2018-04-29 LAB — CBC
HCT: 32.6 % — ABNORMAL LOW (ref 40.0–52.0)
Hemoglobin: 11.2 g/dL — ABNORMAL LOW (ref 13.0–18.0)
MCH: 34.1 pg — AB (ref 26.0–34.0)
MCHC: 34.4 g/dL (ref 32.0–36.0)
MCV: 99 fL (ref 80.0–100.0)
PLATELETS: 193 10*3/uL (ref 150–440)
RBC: 3.3 MIL/uL — AB (ref 4.40–5.90)
RDW: 14.5 % (ref 11.5–14.5)
WBC: 9.9 10*3/uL (ref 3.8–10.6)

## 2018-04-29 LAB — LIPASE, BLOOD: Lipase: 34 U/L (ref 11–51)

## 2018-04-29 MED ORDER — CEPHALEXIN 500 MG PO CAPS
500.0000 mg | ORAL_CAPSULE | Freq: Once | ORAL | Status: AC
  Administered 2018-04-29: 500 mg via ORAL
  Filled 2018-04-29: qty 1

## 2018-04-29 MED ORDER — SODIUM CHLORIDE 0.9 % IV BOLUS
500.0000 mL | Freq: Once | INTRAVENOUS | Status: AC
  Administered 2018-04-29: 500 mL via INTRAVENOUS

## 2018-04-29 MED ORDER — CEPHALEXIN 500 MG PO CAPS: 500.0000 mg | ORAL_CAPSULE | Freq: Two times a day (BID) | ORAL | 0 refills | Status: DC

## 2018-04-29 NOTE — Discharge Instructions (Addendum)
As we discussed, we think that you have a urinary tract infection which is causing the burning when you urinate.  On top of that, as Dr. Bernardo Heater told you before, your bladder does not always know when you need to go to the bathroom.  We encourage you to continue trying to do the self-catheterization at home; since you do have an infection and we started you on antibiotics, it would be better to avoid placing an indwelling Foley if you do not absolutely needed.  Please continue trying to stay hydrated by drinking plenty of fluids including water or Gatorade or Pedialyte.  Read through the included information and follow-up with Dr. Bernardo Heater at the next available opportunity.  Return to the emergency department if you develop new or worsening symptoms that concern you.

## 2018-04-29 NOTE — ED Provider Notes (Addendum)
[facility] Emergency Department Provider Note  ____________________________________________   First MD Initiated Contact with Patient 04/29/18 1628     (approximate)  I have reviewed the triage vital signs and the nursing notes.   HISTORY  Chief Complaint Dysuria and Diarrhea    HPI Lonnie A Yazeed Pryer. is a 84 y.o. male with medical history as listed below who presents for evaluation of dysuria, increased urinary frequency, and generalized weakness.  The symptoms started 3 to 4 days ago and have been gradually getting worse.  Today he and his daughter described him as severe.  Nothing particular makes them better and exertion makes him more tired.  He denies fever/chills, chest pain, shortness of breath, nausea, vomiting.  He has had decreased appetite but he has been trying to drink water and Pedialyte to stay hydrated.  He has a history of urinary retention and saw Dr. Bernardo Heater last week and was encouraged to self cath, but he has had trouble doing so.  He denies having any abdominal pain or pressure and states that it only hurts when he urinates and it is a burning sensation.  Past Medical History:  Diagnosis Date  . Bundle branch block, left   . COPD (chronic obstructive pulmonary disease) ([place])   . Coronary artery disease   . Hypertension   . PSA elevation   . Renal disorder     Patient Active Problem List   Diagnosis Date Noted  . Incomplete bladder emptying 08/09/2017  . 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 Stage 3 chronic kidney disease ([place]) 08/09/2017  . Pneumonia 01/10/2016    Past Surgical History:  Procedure Laterality Date  . APPENDECTOMY    . RECTAL SURGERY     x3    Prior to Admission medications   Medication Sig Start Date End Date Taking? Authorizing Provider  alfuzosin (UROXATRAL) 10 MG 24 hr tablet Take 10 mg by mouth daily with breakfast.    [provider]  ascorbic acid (VITAMIN C) 1000  MG tablet Take 1,000 mg by mouth daily.    [provider]  aspirin 81 MG tablet Take 81 mg by mouth daily.    [provider]  atorvastatin (LIPITOR) 80 MG tablet Take 80 mg by mouth daily.    [provider]  budesonide-formoterol (SYMBICORT) 160-4.5 MCG/ACT inhaler Inhale into the lungs. 01/31/18 01/31/19  [provider]  cephALEXin (KEFLEX) 500 MG capsule Take 1 capsule (500 mg total) by mouth 2 (two) times daily. 04/29/18   Hinda Kehr, MD  feeding supplement, ENSURE ENLIVE, (ENSURE ENLIVE) LIQD Take 237 mLs by mouth 3 (three) times daily. 01/18/16   Hower, Aaron Mose, MD  ferrous sulfate 325 (65 FE) MG tablet Take 325 mg by mouth daily with breakfast.    [provider]  finasteride (PROSCAR) 5 MG tablet Take 5 mg by mouth daily.    [provider]  Fluticasone-Salmeterol (ADVAIR) 250-50 MCG/DOSE AEPB Inhale 1 puff into the lungs 2 (two) times daily.    [provider]  gabapentin (NEURONTIN) 300 MG capsule Take 900 mg by mouth every evening.    [provider]  Ipratropium-Albuterol (COMBIVENT) 20-100 MCG/ACT AERS respimat Inhale 1 puff into the lungs 2 (two) times daily.    [provider]  lisinopril (PRINIVIL,ZESTRIL) 40 MG tablet Take 40 mg by mouth daily.    [provider]  magnesium oxide (MAG-OX) 400 MG tablet Take 400 mg by mouth daily.  [provider]  Melatonin 5 MG TABS Take by mouth.    [provider]  Multiple Vitamin (MULTI-VITAMINS) TABS Take by mouth.    [provider]  omeprazole (PRILOSEC) 20 MG capsule Take 20 mg by mouth daily.    [provider]  traMADol (ULTRAM) 50 MG tablet Take by mouth every 6 (six) hours as needed.    [provider]  triamcinolone cream (KENALOG) 0.1 % Apply 1 application topically 2 (two) times daily.    [provider]  verapamil (CALAN) 120 MG tablet Take 120 mg by mouth daily.     [provider]  zolpidem (AMBIEN) 10 MG tablet Take 10 mg by mouth at bedtime as needed for sleep.    [provider]    Allergies Sulfa antibiotics and Sulfasalazine  Family History  Problem Relation Age of Onset  . Heart attack Mother   . Heart attack Father     Social History Social History   Tobacco Use  . Smoking status: Former Smoker    Packs/day: 2.00    Years: 50.00    Pack years: 100.00    Types: Cigarettes  . Smokeless tobacco: Never Used  Substance Use Topics  . Alcohol use: Yes    Alcohol/week: 0.0 oz  . Drug use: No    Review of Systems Constitutional: No fever/chills.  Generalized weakness and malaise. Eyes: No visual changes. ENT: No sore throat. Cardiovascular: Denies chest pain. Respiratory: Denies shortness of breath. Gastrointestinal: No abdominal pain.  No nausea, no vomiting.  No diarrhea.  No constipation. Genitourinary: 3 to 4 days of dysuria and increased urinary frequency Musculoskeletal: Negative for neck pain.  Negative for back pain. Integumentary: Negative for rash. Neurological: Negative for headaches, focal weakness or numbness.   ____________________________________________   PHYSICAL EXAM:  VITAL SIGNS: ED Triage Vitals [04/29/18 1340]  Enc Vitals Group     BP (!) 150/78     Pulse Rate 74     Resp 16     Temp 97.8 F (36.6 C)     Temp Source Oral     SpO2 98 %     Weight 79.4 kg (175 lb)     Height 1.829 m (6')     Head Circumference      Peak Flow      Pain Score 0     Pain Loc      Pain Edu?      Excl. in [place]?     Constitutional: Alert and oriented. Elderly but generally well appearing and in no acute distress. Eyes: Conjunctivae are normal.  Head: Atraumatic. Nose: No congestion/rhinnorhea. Mouth/Throat: Mucous membranes are moist. Neck: No stridor.  No meningeal signs.   Cardiovascular: Normal rate, regular rhythm. Good peripheral circulation. Grossly normal heart sounds. Respiratory: Normal  respiratory effort.  No retractions. Lungs CTAB. Gastrointestinal: Soft and nontender. No distention.  Musculoskeletal: No lower extremity tenderness nor edema. No gross deformities of extremities. Neurologic:  Normal speech and language. No gross focal neurologic deficits are appreciated.  Skin:  Skin is warm, dry and intact. No rash noted. Psychiatric: Mood and affect are normal. Speech and behavior are normal.  ____________________________________________   LABS (all labs ordered are listed, but only abnormal results are displayed)  Labs Reviewed  COMPREHENSIVE METABOLIC PANEL - Abnormal; Notable for the following components:      Result Value   Glucose, Bld 111 (*)    Creatinine, Ser 1.34 (*)    GFR calc  non Af Amer 45 (*)    GFR calc Af Amer 52 (*)    All other components within normal limits  CBC - Abnormal; Notable for the following components:   RBC 3.30 (*)    Hemoglobin 11.2 (*)    HCT 32.6 (*)    MCH 34.1 (*)    All other components within normal limits  URINALYSIS, COMPLETE (UACMP) WITH MICROSCOPIC - Abnormal; Notable for the following components:   Color, Urine YELLOW (*)    APPearance CLOUDY (*)    Hgb urine dipstick SMALL (*)    Ketones, ur 5 (*)    Protein, ur 100 (*)    Leukocytes, UA LARGE (*)    WBC, UA >50 (*)    All other components within normal limits  URINE CULTURE  LIPASE, BLOOD   ____________________________________________  EKG  None - EKG not ordered by ED physician ____________________________________________  RADIOLOGY   ED MD interpretation: No indication for imaging  Official radiology report(s): No results found.  ____________________________________________   PROCEDURES  Critical Care performed: No   Procedure(s) performed:   Procedures   ____________________________________________   INITIAL IMPRESSION / ASSESSMENT AND PLAN / ED COURSE  As part of my medical decision making, I reviewed the following data within  the [place] History obtained from family, Nursing notes reviewed and incorporated, Labs reviewed  and Old chart reviewed    Differential diagnosis includes, but is not limited to, urinary tract infection, ureteral stone, BPH, urinary retention, other nonspecific illness such as pneumonia.  The patient has no acute medical complaints at this time other than as described above and states he is hungry and ready to go.  His daughter is quite concerned because he has not been eating or drinking very much recently.  His creatinine is slightly elevated from baseline at 1.3 today and he has a few ketones in his urine.  His vital signs are stable and his lab work is generally reassuring with no leukocytosis and an otherwise normal conference of metabolic panel except as described above.  However his urinalysis is cloudy with a nitrite negative urinary tract infection.  This could be from his recent efforts at self-catheterization.  A bladder scan reveals greater than 500 mL in the bladder, but I reviewed his medical record and saw that when he saw Dr. Durenda Age last week he had greater than 600 mL in his bladder and a hyposensitive bladder leading to difficulty emptying.  I verify the plan for self-catheterization at home.  A urine culture is pending and I will start treatment with Keflex 500 mg by mouth.  We are placing an IV and I will give him 500 mL of normal saline to help hydrate him and he and his daughter both said that they will make sure he drinks plenty of fluids.  I encourage calling Dr. Bernardo Heater for close follow-up.  I gave my usual and customary return precautions.     ____________________________________________  FINAL CLINICAL IMPRESSION(S) / ED DIAGNOSES  Final diagnoses:  Urinary tract infection with hematuria, site unspecified  Urinary retention  Volume depletion     MEDICATIONS GIVEN DURING THIS VISIT:  Medications  sodium chloride 0.9 % bolus 500 mL (has no  administration in time range)  cephALEXin (KEFLEX) capsule 500 mg (500 mg Oral Given 04/29/18 1721)     ED Discharge Orders        Ordered    cephALEXin (KEFLEX) 500 MG capsule  2 times daily  04/29/18 1729       Note:  This document was prepared using Dragon voice recognition software and may include unintentional dictation errors.    Hinda Kehr, MD 04/29/18 Rodney Booze    Hinda Kehr, MD 04/29/18 1729

## 2018-04-29 NOTE — ED Notes (Signed)
Bladder scan findings of 549ml

## 2018-04-29 NOTE — ED Triage Notes (Signed)
Patient presents to the ED with dysuria and urinary frequency.  Patient states symptoms began approx. 3-4 days ago.  Patient is in no obvious distress.  Patient reports diarrhea each time he urinates as well.

## 2018-04-29 NOTE — ED Triage Notes (Signed)
First Nurse Note:  Arrives from home via [facility] for c/o frequent urination x 3-4 days.  VS wnl.

## 2018-05-01 LAB — URINE CULTURE: SPECIAL REQUESTS: NORMAL

## 2018-05-11 ENCOUNTER — Ambulatory Visit (INDEPENDENT_AMBULATORY_CARE_PROVIDER_SITE_OTHER): Payer: Medicare Other | Admitting: Urology

## 2018-05-11 ENCOUNTER — Encounter: Payer: Self-pay | Admitting: Urology

## 2018-05-11 ENCOUNTER — Other Ambulatory Visit: Payer: Self-pay

## 2018-05-11 VITALS — BP 118/62 | HR 77 | Ht 72.0 in | Wt 176.8 lb

## 2018-05-11 DIAGNOSIS — N138 Other obstructive and reflux uropathy: Secondary | ICD-10-CM | POA: Diagnosis not present

## 2018-05-11 DIAGNOSIS — N401 Enlarged prostate with lower urinary tract symptoms: Secondary | ICD-10-CM | POA: Diagnosis not present

## 2018-05-11 DIAGNOSIS — R339 Retention of urine, unspecified: Secondary | ICD-10-CM | POA: Diagnosis not present

## 2018-05-11 MED ORDER — CEFUROXIME AXETIL 500 MG PO TABS: 500.0000 mg | ORAL_TABLET | Freq: Two times a day (BID) | ORAL | 0 refills | Status: AC

## 2018-05-11 NOTE — Progress Notes (Signed)
05/11/2018 1:37 PM   Lonnie [name]. Sep 04, 1927 [id number]  Referring provider: Derinda Late, MD [phone] [address] [facility] [place], [place] [address]  Chief Complaint  Patient presents with  . Benign Prostatic Hypertrophy    HPI: 84 year old male with urinary retention and urodynamic study showing outlet obstruction and bladder hypotonicity.  At his last visit approximately 2 weeks ago he did not desire outlet surgery and elected intermittent catheterization.  He was seen in the ED on 7/28 complaining of dysuria and urine culture positive for Klebsiella.  He was treated with a seven-day course of Keflex twice daily.  He ran out of catheters several days ago and is complaining of persistent dysuria.  He denies fever or chills.   PMH: Past Medical History:  Diagnosis Date  . Bundle branch block, left   . COPD (chronic obstructive pulmonary disease) ([place])   . Coronary artery disease   . Hypertension   . PSA elevation   . Renal disorder     Surgical History: Past Surgical History:  Procedure Laterality Date  . APPENDECTOMY    . RECTAL SURGERY     x3    Home Medications:  Allergies as of 05/11/2018      Reactions   Sulfa Antibiotics Other (See Comments)   Not sure of reaction. It was too long ago, but he said not to give it to him.    Sulfasalazine    Other reaction(s): Other (See Comments) Not sure of reaction. It was too long ago, but he said not to give it to him.       Medication List        Accurate as of 05/11/18  1:37 PM. Always use your most recent med list.          alfuzosin 10 MG 24 hr tablet Commonly known as:  UROXATRAL Take 10 mg by mouth daily with breakfast.   ascorbic acid 1000 MG tablet Commonly known as:  VITAMIN C Take 1,000 mg by mouth daily.   aspirin 81 MG tablet Take 81 mg by mouth daily.   atorvastatin 80 MG tablet Commonly known as:  LIPITOR Take 80 mg by mouth daily.   cephALEXin 500  MG capsule Commonly known as:  KEFLEX Take 1 capsule (500 mg total) by mouth 2 (two) times daily.   feeding supplement (ENSURE ENLIVE) Liqd Take 237 mLs by mouth 3 (three) times daily.   ferrous sulfate 325 (65 FE) MG tablet Take 325 mg by mouth daily with breakfast.   finasteride 5 MG tablet Commonly known as:  PROSCAR Take 5 mg by mouth daily.   Fluticasone-Salmeterol 250-50 MCG/DOSE Aepb Commonly known as:  ADVAIR Inhale 1 puff into the lungs 2 (two) times daily.   gabapentin 300 MG capsule Commonly known as:  NEURONTIN Take 900 mg by mouth every evening.   Ipratropium-Albuterol 20-100 MCG/ACT Aers respimat Commonly known as:  COMBIVENT Inhale 1 puff into the lungs 2 (two) times daily.   lisinopril 40 MG tablet Commonly known as:  PRINIVIL,ZESTRIL Take 40 mg by mouth daily.   magnesium oxide 400 MG tablet Commonly known as:  MAG-OX Take 400 mg by mouth daily.   Melatonin 5 MG Tabs Take by mouth.   MULTI-VITAMINS Tabs Take by mouth.   omeprazole 20 MG capsule Commonly known as:  PRILOSEC Take 20 mg by mouth daily.   SYMBICORT 160-4.5 MCG/ACT inhaler Generic drug:  budesonide-formoterol Inhale into the lungs.  traMADol 50 MG tablet Commonly known as:  ULTRAM Take by mouth every 6 (six) hours as needed.   triamcinolone cream 0.1 % Commonly known as:  KENALOG Apply 1 application topically 2 (two) times daily.   verapamil 120 MG tablet Commonly known as:  CALAN Take 120 mg by mouth daily.   zolpidem 10 MG tablet Commonly known as:  AMBIEN Take 10 mg by mouth at bedtime as needed for sleep.       Allergies:  Allergies  Allergen Reactions  . Sulfa Antibiotics Other (See Comments)    Not sure of reaction. It was too long ago, but he said not to give it to him.   . Sulfasalazine     Other reaction(s): Other (See Comments) Not sure of reaction. It was too long ago, but he said not to give it to him.     Family History: Family History  Problem  Relation Age of Onset  . Heart attack Mother   . Heart attack Father     Social History:  reports that he has quit smoking. His smoking use included cigarettes. He has a 100.00 pack-year smoking history. He has never used smokeless tobacco. He reports that he drinks alcohol. He reports that he does not use drugs.  ROS: UROLOGY Frequent Urination?: Yes Hard to postpone urination?: Yes Burning/pain with urination?: Yes Get up at night to urinate?: Yes Leakage of urine?: Yes Urine stream starts and stops?: Yes Trouble starting stream?: Yes Do you have to strain to urinate?: No Blood in urine?: No Urinary tract infection?: No Sexually transmitted disease?: No Injury to kidneys or bladder?: No Painful intercourse?: No Weak stream?: Yes Erection problems?: Yes Penile pain?: No  Gastrointestinal Nausea?: No Vomiting?: No Indigestion/heartburn?: No Diarrhea?: Yes Constipation?: Yes  Constitutional Fever: No Night sweats?: No Weight loss?: No Fatigue?: No  Skin Skin rash/lesions?: Yes Itching?: No  Eyes Blurred vision?: Yes Double vision?: No  Ears/Nose/Throat Sore throat?: No Sinus problems?: No  Hematologic/Lymphatic Swollen glands?: No Easy bruising?: No  Cardiovascular Leg swelling?: No Chest pain?: No  Respiratory Cough?: No Shortness of breath?: No  Endocrine Excessive thirst?: No  Musculoskeletal Back pain?: No Joint pain?: No  Neurological Headaches?: No Dizziness?: No  Psychologic Depression?: No Anxiety?: No  Physical Exam: BP 118/62   Pulse 77   Ht 6' (1.829 m)   Wt 176 lb 12.8 oz (80.2 kg)   BMI 23.98 kg/m   Constitutional:  Alert and oriented, No acute distress. HEENT: [place] AT, moist mucus membranes.  Trachea midline, no masses. Cardiovascular: No clubbing, cyanosis, or edema. Respiratory: Normal respiratory effort, no increased work of breathing. GI: Abdomen is soft, nontender, nondistended, no abdominal masses GU: No CVA  tenderness Lymph: No cervical or inguinal lymphadenopathy. Skin: No rashes, bruises or suspicious lesions. Neurologic: Grossly intact, no focal deficits, moving all 4 extremities. Psychiatric: Normal mood and affect.   Assessment & Plan:   84 year old male with UTI and urinary retention.  Will go ahead and place a Foley catheter to remain indwelling for 3 days then resume CIC.  Urine culture will be sent.  Rx cefuroxime was sent to pharmacy.   Abbie Sons, [place] [address] [place], [place] [address] [phone]

## 2018-05-11 NOTE — Progress Notes (Signed)
Cath Change/ Replacement  Patient is present today for a catheter insertion due to incomplete bladder emptying. Patient was cleaned and prepped in a sterile fashion with betadine and 2% lidocaine jelly was instilled into the urethra. A 16FR foley cath was replaced into the bladder no complications were noted Urine return was noted 556ml and urine was pale yellow in color. The balloon was filled with 2ml of sterile water. A leg bag was attached for drainage.  A night bag was also given to the patient and patient was given instruction on how to change from one bag to another. Patient was given proper instruction on catheter care.    Preformed by: Gordy Clement, [place]

## 2018-05-14 ENCOUNTER — Encounter: Payer: Self-pay | Admitting: Urology

## 2018-05-15 ENCOUNTER — Ambulatory Visit: Payer: Medicare Other

## 2018-05-15 DIAGNOSIS — R339 Retention of urine, unspecified: Secondary | ICD-10-CM

## 2018-05-15 LAB — CULTURE, URINE COMPREHENSIVE

## 2018-05-15 NOTE — Progress Notes (Signed)
Catheter Removal  Patient is present today for a catheter removal.  48ml of water was drained from the balloon. A 16FR foley cath was removed from the bladder no complications were noted . Patient tolerated well.  Preformed by: Cristie Hem, CMA  Follow up/ Additional notes: Return this afternoon by 1500 for bladder scan.

## 2018-05-20 ENCOUNTER — Other Ambulatory Visit: Payer: Self-pay | Admitting: Urology

## 2018-05-20 MED ORDER — DOXYCYCLINE HYCLATE 100 MG PO CAPS: 100.0000 mg | ORAL_CAPSULE | Freq: Two times a day (BID) | ORAL | 0 refills | Status: DC

## 2018-05-21 ENCOUNTER — Telehealth: Payer: Self-pay

## 2018-05-21 NOTE — Telephone Encounter (Signed)
-----   Message from Abbie Sons, MD sent at 05/20/2018  9:37 AM EDT ----- Urine culture positive but resistant to cefuroxime.  Rx for doxycycline was sent.  Discontinue cefuroxime.

## 2018-05-21 NOTE — Telephone Encounter (Signed)
Called pt informed him of the need to change antibiotics. Pt gave verbal understanding.

## 2018-05-28 ENCOUNTER — Telehealth: Payer: Self-pay | Admitting: Urology

## 2018-05-28 NOTE — Telephone Encounter (Signed)
Patient called the office this morning requesting catheters (Speedicath Coude 51F in the gray package)  Placed upfront for patient to pick up.

## 2018-05-28 NOTE — Telephone Encounter (Signed)
Patient's daughter Loletha Carrow wants to know if he needs to come in here after he finishes the ABX to have another UA done? Please call her @ [phone]   [facility]

## 2018-05-29 NOTE — Telephone Encounter (Signed)
Called daughter informed her that no UA was needed as a f/u, did however scheduled pt for a 6 month f/u appt.

## 2018-06-28 ENCOUNTER — Other Ambulatory Visit: Payer: Self-pay | Admitting: Student

## 2018-06-28 DIAGNOSIS — R011 Cardiac murmur, unspecified: Secondary | ICD-10-CM

## 2018-07-16 ENCOUNTER — Ambulatory Visit
Admission: RE | Admit: 2018-07-16 | Discharge: 2018-07-16 | Disposition: A | Discharge status: Home / Self Care | Payer: Medicare Other | Source: Ambulatory Visit | Attending: Student | Admitting: Student

## 2018-07-16 DIAGNOSIS — R011 Cardiac murmur, unspecified: Secondary | ICD-10-CM | POA: Diagnosis present

## 2018-07-16 DIAGNOSIS — I2510 Atherosclerotic heart disease of native coronary artery without angina pectoris: Secondary | ICD-10-CM | POA: Diagnosis not present

## 2018-07-16 DIAGNOSIS — J449 Chronic obstructive pulmonary disease, unspecified: Secondary | ICD-10-CM | POA: Insufficient documentation

## 2018-07-16 DIAGNOSIS — I447 Left bundle-branch block, unspecified: Secondary | ICD-10-CM | POA: Diagnosis not present

## 2018-07-16 DIAGNOSIS — I119 Hypertensive heart disease without heart failure: Secondary | ICD-10-CM | POA: Diagnosis not present

## 2018-07-16 NOTE — Progress Notes (Signed)
*  PRELIMINARY RESULTS* Echocardiogram 2D Echocardiogram has been performed.  Lonnie [name] 07/16/2018, 11:15 AM

## 2018-08-11 ENCOUNTER — Emergency Department: Payer: No Typology Code available for payment source

## 2018-08-11 ENCOUNTER — Other Ambulatory Visit: Payer: Self-pay

## 2018-08-11 ENCOUNTER — Encounter: Payer: Self-pay | Admitting: Emergency Medicine

## 2018-08-11 ENCOUNTER — Inpatient Hospital Stay
Admission: EM | Admit: 2018-08-11 | Discharge: 2018-08-15 | DRG: 698 | Disposition: A | Discharge status: Home / Self Care | Payer: No Typology Code available for payment source | Attending: Internal Medicine | Admitting: Internal Medicine

## 2018-08-11 DIAGNOSIS — R531 Weakness: Secondary | ICD-10-CM

## 2018-08-11 DIAGNOSIS — I2510 Atherosclerotic heart disease of native coronary artery without angina pectoris: Secondary | ICD-10-CM | POA: Diagnosis present

## 2018-08-11 DIAGNOSIS — A4151 Sepsis due to Escherichia coli [E. coli]: Secondary | ICD-10-CM | POA: Diagnosis present

## 2018-08-11 DIAGNOSIS — I447 Left bundle-branch block, unspecified: Secondary | ICD-10-CM | POA: Diagnosis present

## 2018-08-11 DIAGNOSIS — J449 Chronic obstructive pulmonary disease, unspecified: Secondary | ICD-10-CM | POA: Diagnosis present

## 2018-08-11 DIAGNOSIS — Z7951 Long term (current) use of inhaled steroids: Secondary | ICD-10-CM

## 2018-08-11 DIAGNOSIS — Z87440 Personal history of urinary (tract) infections: Secondary | ICD-10-CM | POA: Diagnosis not present

## 2018-08-11 DIAGNOSIS — Z79899 Other long term (current) drug therapy: Secondary | ICD-10-CM | POA: Diagnosis not present

## 2018-08-11 DIAGNOSIS — Z7982 Long term (current) use of aspirin: Secondary | ICD-10-CM | POA: Diagnosis not present

## 2018-08-11 DIAGNOSIS — N183 Chronic kidney disease, stage 3 (moderate): Secondary | ICD-10-CM | POA: Diagnosis present

## 2018-08-11 DIAGNOSIS — Z8249 Family history of ischemic heart disease and other diseases of the circulatory system: Secondary | ICD-10-CM

## 2018-08-11 DIAGNOSIS — Y846 Urinary catheterization as the cause of abnormal reaction of the patient, or of later complication, without mention of misadventure at the time of the procedure: Secondary | ICD-10-CM | POA: Diagnosis present

## 2018-08-11 DIAGNOSIS — Y92019 Unspecified place in single-family (private) house as the place of occurrence of the external cause: Secondary | ICD-10-CM | POA: Diagnosis not present

## 2018-08-11 DIAGNOSIS — N401 Enlarged prostate with lower urinary tract symptoms: Secondary | ICD-10-CM | POA: Diagnosis present

## 2018-08-11 DIAGNOSIS — I5022 Chronic systolic (congestive) heart failure: Secondary | ICD-10-CM | POA: Diagnosis present

## 2018-08-11 DIAGNOSIS — T83518A Infection and inflammatory reaction due to other urinary catheter, initial encounter: Secondary | ICD-10-CM | POA: Diagnosis present

## 2018-08-11 DIAGNOSIS — Z87891 Personal history of nicotine dependence: Secondary | ICD-10-CM | POA: Diagnosis not present

## 2018-08-11 DIAGNOSIS — E785 Hyperlipidemia, unspecified: Secondary | ICD-10-CM | POA: Diagnosis present

## 2018-08-11 DIAGNOSIS — Z79891 Long term (current) use of opiate analgesic: Secondary | ICD-10-CM | POA: Diagnosis not present

## 2018-08-11 DIAGNOSIS — N138 Other obstructive and reflux uropathy: Secondary | ICD-10-CM | POA: Diagnosis present

## 2018-08-11 DIAGNOSIS — N390 Urinary tract infection, site not specified: Secondary | ICD-10-CM | POA: Diagnosis present

## 2018-08-11 DIAGNOSIS — Z882 Allergy status to sulfonamides status: Secondary | ICD-10-CM | POA: Diagnosis not present

## 2018-08-11 DIAGNOSIS — R7881 Bacteremia: Secondary | ICD-10-CM

## 2018-08-11 DIAGNOSIS — I130 Hypertensive heart and chronic kidney disease with heart failure and stage 1 through stage 4 chronic kidney disease, or unspecified chronic kidney disease: Secondary | ICD-10-CM | POA: Diagnosis present

## 2018-08-11 DIAGNOSIS — Z79890 Hormone replacement therapy (postmenopausal): Secondary | ICD-10-CM | POA: Diagnosis not present

## 2018-08-11 DIAGNOSIS — B961 Klebsiella pneumoniae [K. pneumoniae] as the cause of diseases classified elsewhere: Secondary | ICD-10-CM | POA: Diagnosis present

## 2018-08-11 LAB — CBC WITH DIFFERENTIAL/PLATELET
Abs Immature Granulocytes: 0.05 10*3/uL (ref 0.00–0.07)
BASOS ABS: 0 10*3/uL (ref 0.0–0.1)
Basophils Relative: 0 %
EOS ABS: 0 10*3/uL (ref 0.0–0.5)
EOS PCT: 0 %
HEMATOCRIT: 33 % — AB (ref 39.0–52.0)
HEMOGLOBIN: 11.1 g/dL — AB (ref 13.0–17.0)
Immature Granulocytes: 1 %
Lymphocytes Relative: 3 %
Lymphs Abs: 0.3 10*3/uL — ABNORMAL LOW (ref 0.7–4.0)
MCH: 33 pg (ref 26.0–34.0)
MCHC: 33.6 g/dL (ref 30.0–36.0)
MCV: 98.2 fL (ref 80.0–100.0)
MONO ABS: 0.2 10*3/uL (ref 0.1–1.0)
Monocytes Relative: 2 %
NRBC: 0 % (ref 0.0–0.2)
Neutro Abs: 9.1 10*3/uL — ABNORMAL HIGH (ref 1.7–7.7)
Neutrophils Relative %: 94 %
Platelets: 151 10*3/uL (ref 150–400)
RBC: 3.36 MIL/uL — AB (ref 4.22–5.81)
RDW: 13.1 % (ref 11.5–15.5)
WBC: 9.7 10*3/uL (ref 4.0–10.5)

## 2018-08-11 LAB — BRAIN NATRIURETIC PEPTIDE: B NATRIURETIC PEPTIDE 5: 159 pg/mL — AB (ref 0.0–100.0)

## 2018-08-11 LAB — URINALYSIS, COMPLETE (UACMP) WITH MICROSCOPIC
BILIRUBIN URINE: NEGATIVE
Bacteria, UA: NONE SEEN
Glucose, UA: NEGATIVE mg/dL
Hgb urine dipstick: NEGATIVE
Ketones, ur: NEGATIVE mg/dL
Nitrite: NEGATIVE
Protein, ur: NEGATIVE mg/dL
SPECIFIC GRAVITY, URINE: 1.009 (ref 1.005–1.030)
SQUAMOUS EPITHELIAL / LPF: NONE SEEN (ref 0–5)
WBC, UA: >50 WBC/hpf — ABNORMAL HIGH (ref 0–5)
pH: 7 (ref 5.0–8.0)

## 2018-08-11 LAB — COMPREHENSIVE METABOLIC PANEL
ALT: 22 U/L (ref 0–44)
ANION GAP: 10 (ref 5–15)
AST: 27 U/L (ref 15–41)
Albumin: 3.9 g/dL (ref 3.5–5.0)
Alkaline Phosphatase: 75 U/L (ref 38–126)
BUN: 19 mg/dL (ref 8–23)
CHLORIDE: 100 mmol/L (ref 98–111)
CO2: 27 mmol/L (ref 22–32)
Calcium: 9.3 mg/dL (ref 8.9–10.3)
Creatinine, Ser: 1.3 mg/dL — ABNORMAL HIGH (ref 0.61–1.24)
GFR, EST AFRICAN AMERICAN: 54 mL/min — AB (ref >60)
GFR, EST NON AFRICAN AMERICAN: 47 mL/min — AB (ref >60)
Glucose, Bld: 113 mg/dL — ABNORMAL HIGH (ref 70–99)
POTASSIUM: 3.9 mmol/L (ref 3.5–5.1)
SODIUM: 137 mmol/L (ref 135–145)
Total Bilirubin: 0.7 mg/dL (ref 0.3–1.2)
Total Protein: 6.7 g/dL (ref 6.5–8.1)

## 2018-08-11 LAB — LACTIC ACID, PLASMA
Lactic Acid, Venous: 2.4 mmol/L (ref 0.5–1.9)
Lactic Acid, Venous: 2.2 mmol/L (ref 0.5–1.9)

## 2018-08-11 LAB — TROPONIN I

## 2018-08-11 MED ORDER — SODIUM CHLORIDE 0.9 % IV BOLUS: 1000.0000 mL | Freq: Once | INTRAVENOUS | Status: DC

## 2018-08-11 MED ORDER — SODIUM CHLORIDE 0.9 % IV SOLN: Freq: Once | INTRAVENOUS | Status: DC

## 2018-08-11 MED ORDER — VERAPAMIL HCL 120 MG PO TABS
120.0000 mg | ORAL_TABLET | Freq: Every day | ORAL | Status: DC
  Administered 2018-08-12 – 2018-08-15 (×4): 120 mg via ORAL
  Filled 2018-08-11 (×5): qty 1

## 2018-08-11 MED ORDER — SODIUM CHLORIDE 0.9 % IV SOLN
1.0000 g | Freq: Once | INTRAVENOUS | Status: AC
  Administered 2018-08-11: 1 g via INTRAVENOUS
  Filled 2018-08-11: qty 10

## 2018-08-11 MED ORDER — SODIUM CHLORIDE 0.9 % IV SOLN
Freq: Once | INTRAVENOUS | Status: AC
  Administered 2018-08-11: 17:00:00 via INTRAVENOUS

## 2018-08-11 MED ORDER — ASPIRIN EC 81 MG PO TBEC
81.0000 mg | DELAYED_RELEASE_TABLET | Freq: Every day | ORAL | Status: DC
  Administered 2018-08-12 – 2018-08-15 (×4): 81 mg via ORAL
  Filled 2018-08-11 (×4): qty 1

## 2018-08-11 MED ORDER — FERROUS SULFATE 325 (65 FE) MG PO TABS
325.0000 mg | ORAL_TABLET | Freq: Every day | ORAL | Status: DC
  Administered 2018-08-12 – 2018-08-15 (×4): 325 mg via ORAL
  Filled 2018-08-11 (×4): qty 1

## 2018-08-11 MED ORDER — GABAPENTIN 300 MG PO CAPS
900.0000 mg | ORAL_CAPSULE | Freq: Every evening | ORAL | Status: DC
  Administered 2018-08-11: 600 mg via ORAL
  Filled 2018-08-11: qty 3

## 2018-08-11 MED ORDER — GABAPENTIN 300 MG PO CAPS
600.0000 mg | ORAL_CAPSULE | Freq: Two times a day (BID) | ORAL | Status: DC
  Administered 2018-08-12 – 2018-08-15 (×7): 600 mg via ORAL
  Filled 2018-08-11 (×7): qty 2

## 2018-08-11 MED ORDER — SODIUM CHLORIDE 0.9 % IV SOLN
1.0000 g | INTRAVENOUS | Status: DC
  Administered 2018-08-11: 1 g via INTRAVENOUS
  Filled 2018-08-11: qty 1

## 2018-08-11 MED ORDER — HEPARIN SODIUM (PORCINE) 5000 UNIT/ML IJ SOLN
5000.0000 [IU] | Freq: Three times a day (TID) | INTRAMUSCULAR | Status: DC
  Administered 2018-08-11 – 2018-08-15 (×11): 5000 [IU] via SUBCUTANEOUS
  Filled 2018-08-11 (×11): qty 1

## 2018-08-11 MED ORDER — ATORVASTATIN CALCIUM 20 MG PO TABS
80.0000 mg | ORAL_TABLET | Freq: Every day | ORAL | Status: DC
  Administered 2018-08-12 – 2018-08-14 (×3): 80 mg via ORAL
  Filled 2018-08-11 (×5): qty 4

## 2018-08-11 MED ORDER — MAGNESIUM OXIDE 400 (241.3 MG) MG PO TABS
400.0000 mg | ORAL_TABLET | Freq: Every day | ORAL | Status: DC
  Administered 2018-08-12 – 2018-08-15 (×4): 400 mg via ORAL
  Filled 2018-08-11 (×5): qty 1

## 2018-08-11 MED ORDER — TRAMADOL HCL 50 MG PO TABS
50.0000 mg | ORAL_TABLET | Freq: Four times a day (QID) | ORAL | Status: DC | PRN
  Administered 2018-08-11 – 2018-08-14 (×6): 50 mg via ORAL
  Filled 2018-08-11 (×6): qty 1

## 2018-08-11 MED ORDER — ALFUZOSIN HCL ER 10 MG PO TB24
10.0000 mg | ORAL_TABLET | Freq: Every day | ORAL | Status: DC
  Administered 2018-08-12 – 2018-08-15 (×4): 10 mg via ORAL
  Filled 2018-08-11 (×4): qty 1

## 2018-08-11 MED ORDER — VITAMIN C 500 MG PO TABS
1000.0000 mg | ORAL_TABLET | Freq: Every day | ORAL | Status: DC
  Administered 2018-08-12 – 2018-08-15 (×4): 1000 mg via ORAL
  Filled 2018-08-11 (×5): qty 2

## 2018-08-11 MED ORDER — MOMETASONE FURO-FORMOTEROL FUM 200-5 MCG/ACT IN AERO
2.0000 | INHALATION_SPRAY | Freq: Two times a day (BID) | RESPIRATORY_TRACT | Status: DC
  Administered 2018-08-13 – 2018-08-15 (×3): 2 via RESPIRATORY_TRACT
  Filled 2018-08-11: qty 8.8

## 2018-08-11 MED ORDER — DOCUSATE SODIUM 100 MG PO CAPS
100.0000 mg | ORAL_CAPSULE | Freq: Two times a day (BID) | ORAL | Status: DC | PRN
  Administered 2018-08-12: 100 mg via ORAL
  Filled 2018-08-11: qty 1

## 2018-08-11 MED ORDER — FINASTERIDE 5 MG PO TABS
5.0000 mg | ORAL_TABLET | Freq: Every day | ORAL | Status: DC
  Administered 2018-08-12 – 2018-08-15 (×4): 5 mg via ORAL
  Filled 2018-08-11 (×5): qty 1

## 2018-08-11 MED ORDER — PANTOPRAZOLE SODIUM 40 MG PO TBEC
40.0000 mg | DELAYED_RELEASE_TABLET | Freq: Every day | ORAL | Status: DC
  Administered 2018-08-12 – 2018-08-15 (×4): 40 mg via ORAL
  Filled 2018-08-11 (×5): qty 1

## 2018-08-11 MED ORDER — ENSURE ENLIVE PO LIQD
237.0000 mL | Freq: Three times a day (TID) | ORAL | Status: DC
  Administered 2018-08-11 – 2018-08-14 (×10): 237 mL via ORAL

## 2018-08-11 MED ORDER — SODIUM CHLORIDE 0.9 % IV SOLN
Freq: Once | INTRAVENOUS | Status: AC
  Administered 2018-08-11: 18:00:00 via INTRAVENOUS

## 2018-08-11 NOTE — ED Notes (Signed)
Reg  Diet

## 2018-08-11 NOTE — ED Triage Notes (Signed)
Pt reports  Weakness   And  Has  Noticed  Strong odor  When he  Urinates   Pt lives  Alone  And neighbor checks on him

## 2018-08-11 NOTE — ED Notes (Signed)
Called lab and they are running the  Blood cultures

## 2018-08-11 NOTE — ED Provider Notes (Addendum)
[facility] Emergency Department Provider Note   ____________________________________________   First MD Initiated Contact with Patient 08/11/18 1643     (approximate)  I have reviewed the triage vital signs and the nursing notes.   HISTORY  Chief Complaint Weakness    HPI Lonnie [name]. is a 84 y.o. male who lives by himself comes from home complaining of weakness and foul-smelling urine for several days.  He denies any pain anywhere.  He denies any chest pain belly pain nausea vomiting diarrhea focal weakness headaches back pain or any other complaints.  He has to catheterize himself normally.   Past Medical History:  Diagnosis Date  . Bundle branch block, left   . COPD (chronic obstructive pulmonary disease) ([place])   . Coronary artery disease   . Hypertension   . PSA elevation   . Renal disorder     Patient Active Problem List   Diagnosis Date Noted  . Incomplete bladder emptying 08/09/2017  . 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 Stage 3 chronic kidney disease ([place]) 08/09/2017  . Pneumonia 01/10/2016    Past Surgical History:  Procedure Laterality Date  . APPENDECTOMY    . RECTAL SURGERY     x3    Prior to Admission medications   Medication Sig Start Date End Date Taking? Authorizing Provider  alfuzosin (UROXATRAL) 10 MG 24 hr tablet Take 10 mg by mouth daily with breakfast.    [provider]  ascorbic acid (VITAMIN C) 1000 MG tablet Take 1,000 mg by mouth daily.    [provider]  aspirin 81 MG tablet Take 81 mg by mouth daily.    [provider]  atorvastatin (LIPITOR) 80 MG tablet Take 80 mg by mouth daily.    [provider]  budesonide-formoterol (SYMBICORT) 160-4.5 MCG/ACT inhaler Inhale into the lungs. 01/31/18 01/31/19  [provider]  doxycycline (VIBRAMYCIN) 100 MG capsule Take 1 capsule (100 mg total) by mouth every 12 (twelve) hours. 05/20/18    Stoioff, Ronda Fairly, MD  feeding supplement, ENSURE ENLIVE, (ENSURE ENLIVE) LIQD Take 237 mLs by mouth 3 (three) times daily. 01/18/16   Hower, Aaron Mose, MD  ferrous sulfate 325 (65 FE) MG tablet Take 325 mg by mouth daily with breakfast.    [provider]  finasteride (PROSCAR) 5 MG tablet Take 5 mg by mouth daily.    [provider]  Fluticasone-Salmeterol (ADVAIR) 250-50 MCG/DOSE AEPB Inhale 1 puff into the lungs 2 (two) times daily.    [provider]  gabapentin (NEURONTIN) 300 MG capsule Take 900 mg by mouth every evening.    [provider]  Ipratropium-Albuterol (COMBIVENT) 20-100 MCG/ACT AERS respimat Inhale 1 puff into the lungs 2 (two) times daily.    [provider]  lisinopril (PRINIVIL,ZESTRIL) 40 MG tablet Take 40 mg by mouth daily.    [provider]  magnesium oxide (MAG-OX) 400 MG tablet Take 400 mg by mouth daily.    [provider]  Melatonin 5 MG TABS Take by mouth.    [provider]  Multiple Vitamin (MULTI-VITAMINS) TABS Take by mouth.    [provider]  omeprazole (PRILOSEC) 20 MG capsule Take 20 mg by mouth daily.    [provider]  traMADol (ULTRAM) 50 MG tablet Take by mouth every 6 (six) hours as needed.    [provider]  triamcinolone cream (KENALOG) 0.1 % Apply 1 application topically 2 (two) times daily.  [provider]  verapamil (CALAN) 120 MG tablet Take 120 mg by mouth daily.     [provider]  zolpidem (AMBIEN) 10 MG tablet Take 10 mg by mouth at bedtime as needed for sleep.    [provider]    Allergies Sulfa antibiotics and Sulfasalazine  Family History  Problem Relation Age of Onset  . Heart attack Mother   . Heart attack Father     Social History Social History   Tobacco Use  . Smoking status: Former Smoker    Packs/day: 2.00    Years: 50.00    Pack years: 100.00    Types: Cigarettes  . Smokeless tobacco:  Never Used  Substance Use Topics  . Alcohol use: Yes    Alcohol/week: 0.0 standard drinks  . Drug use: No    Review of Systems  Constitutional: No fever/chills Eyes: No visual changes. ENT: No sore throat. Cardiovascular: Denies chest pain. Respiratory: Denies shortness of breath. Gastrointestinal: No abdominal pain.  No nausea, no vomiting.  No diarrhea.  No constipation. Genitourinary: Negative for dysuria. Musculoskeletal: Negative for back pain. Skin: Negative for rash. Neurological: Negative for headaches, focal weakness   ____________________________________________   PHYSICAL EXAM:  VITAL SIGNS: ED Triage Vitals  Enc Vitals Group     BP 08/11/18 1639 (!) 171/73     Pulse Rate 08/11/18 1639 (!) 115     Resp 08/11/18 1639 (!) 21     Temp 08/11/18 1639 98.7 F (37.1 C)     Temp Source 08/11/18 1639 Oral     SpO2 08/11/18 1639 95 %     Weight 08/11/18 1643 180 lb (81.6 kg)     Height 08/11/18 1643 6' (1.829 m)     Head Circumference --      Peak Flow --      Pain Score 08/11/18 1640 0     Pain Loc --      Pain Edu? --      Excl. in [place]? --     Constitutional: Alert and oriented.  Looks tired Eyes: Conjunctivae are normal. Head: Atraumatic. Nose: No congestion/rhinnorhea. Mouth/Throat: Mucous membranes are moist.  Oropharynx non-erythematous. Neck: No stridor.   Cardiovascular: Normal rate, regular rhythm. Grossly normal heart sounds.  Good peripheral circulation. Respiratory: Normal respiratory effort.  No retractions. Lungs CTAB. Gastrointestinal: Soft and nontender. No distention. No abdominal bruits. No CVA tenderness. Musculoskeletal: No lower extremity tenderness nor edema.  Neurologic:  Normal speech and language. No gross focal neurologic deficits are appreciated. Skin:  Skin is warm, dry and intact. No rash noted. Psychiatric: Mood and affect are normal. Speech and behavior are normal.  ____________________________________________   LABS (all  labs ordered are listed, but only abnormal results are displayed)  Labs Reviewed  COMPREHENSIVE METABOLIC PANEL - Abnormal; Notable for the following components:      Result Value   Glucose, Bld 113 (*)    Creatinine, Ser 1.30 (*)    GFR calc non Af Amer 47 (*)    GFR calc Af Amer 54 (*)    All other components within normal limits  BRAIN NATRIURETIC PEPTIDE - Abnormal; Notable for the following components:   B Natriuretic Peptide 159.0 (*)    All other components within normal limits  LACTIC ACID, PLASMA - Abnormal; Notable for the following components:   Lactic Acid, Venous 2.4 (*)    All other components within normal limits  CBC WITH DIFFERENTIAL/PLATELET - Abnormal; Notable for the following components:  RBC 3.36 (*)    Hemoglobin 11.1 (*)    HCT 33.0 (*)    Neutro Abs 9.1 (*)    Lymphs Abs 0.3 (*)    All other components within normal limits  URINALYSIS, COMPLETE (UACMP) WITH MICROSCOPIC - Abnormal; Notable for the following components:   Color, Urine YELLOW (*)    APPearance CLOUDY (*)    Leukocytes, UA LARGE (*)    WBC, UA >50 (*)    Non Squamous Epithelial PRESENT (*)    All other components within normal limits  CULTURE, BLOOD (ROUTINE X 2)  CULTURE, BLOOD (ROUTINE X 2)  TROPONIN I  LACTIC ACID, PLASMA  URINALYSIS, ROUTINE W REFLEX MICROSCOPIC  I-STAT CG4 LACTIC ACID, ED  I-STAT CG4 LACTIC ACID, ED   ____________________________________________  EKG EKG read and interpreted by me shows sinus tach at a rate of 107 left axis flipped T's in 1 and L left bundle branch block   ____________________________________________  RADIOLOGY  ED MD interpretation chest x-ray read by radiology reviewed by me shows no acute disease.  The cardiologist to see his calcified pleural plaques.  Official radiology report(s): No results found.  ____________________________________________   PROCEDURES  Procedure(s) performed:   Procedures  Critical Care performed: I did  not see this patient long enough to build him for critical care  ____________________________________________   INITIAL IMPRESSION / ASSESSMENT AND PLAN / ED COURSE  Patient with UTI diffuse weakness tachycardia and positive lactate.  We will treat him for sepsis.         ____________________________________________   FINAL CLINICAL IMPRESSION(S) / ED DIAGNOSES  Final diagnoses:  Weakness  Urinary tract infection without hematuria, site unspecified  Also sepsis.   ED Discharge Orders    None       Note:  This document was prepared using Dragon voice recognition software and may include unintentional dictation errors.    Nena Polio, MD 08/11/18 1731    Nena Polio, MD 08/24/18 2337

## 2018-08-11 NOTE — ED Notes (Signed)
Dr Cinda Quest notified  Of fluid bolus

## 2018-08-11 NOTE — H&P (Signed)
[facility] [place] NAME: Lonnie [name]    MR#:  [id number]  DATE OF BIRTH:  10-06-1926  DATE OF ADMISSION:  08/11/2018  PRIMARY CARE PHYSICIAN: Derinda Late, MD   REQUESTING/REFERRING PHYSICIAN: Malinda  CHIEF COMPLAINT:   Chief Complaint  Patient presents with  . Weakness    HISTORY OF PRESENT ILLNESS: Lonnie [name]  is a 84 y.o. male with a known history of left bundle branch block, COPD, coronary artery disease, hypertension, benign prostatic hypertrophy, chronic kidney disease stage III, chronic systolic congestive heart failure-lives alone and walks with a cane, does in and out bladder catheterization twice daily to help with his retention.  He also urinates in between with a lot of residual urine for 3-4 times a day at his baseline. For last few days he has noticed his urinary frequency is increased to almost 10 times a day.  He was noted to have some chills and generalized weakness by his neighbor who is a retired [employer] scientist and suggested to go to emergency room. Noted to have an acute UTI in ER.  PAST MEDICAL HISTORY:   Past Medical History:  Diagnosis Date  . Bundle branch block, left   . COPD (chronic obstructive pulmonary disease) ([place])   . Coronary artery disease   . Hypertension   . PSA elevation   . Renal disorder     PAST SURGICAL HISTORY:  Past Surgical History:  Procedure Laterality Date  . APPENDECTOMY    . RECTAL SURGERY     x3    SOCIAL HISTORY:  Social History   Tobacco Use  . Smoking status: Former Smoker    Packs/day: 2.00    Years: 50.00    Pack years: 100.00    Types: Cigarettes  . Smokeless tobacco: Never Used  Substance Use Topics  . Alcohol use: Yes    Alcohol/week: 0.0 standard drinks    FAMILY HISTORY:  Family History  Problem Relation Age of Onset  . Heart attack Mother   . Heart attack Father     DRUG ALLERGIES:  Allergies  Allergen Reactions  . Sulfa Antibiotics Other (See  Comments)    Not sure of reaction. It was too long ago, but he said not to give it to him.   . Sulfasalazine     Other reaction(s): Other (See Comments) Not sure of reaction. It was too long ago, but he said not to give it to him.     REVIEW OF SYSTEMS:   CONSTITUTIONAL: No fever, have fatigue or weakness.  EYES: No blurred or double vision.  EARS, NOSE, AND THROAT: No tinnitus or ear pain.  RESPIRATORY: No cough, shortness of breath, wheezing or hemoptysis.  CARDIOVASCULAR: No chest pain, orthopnea, edema.  GASTROINTESTINAL: No nausea, vomiting, diarrhea or abdominal pain.  GENITOURINARY: No dysuria, hematuria.  Have increased frequency of urination. ENDOCRINE: No polyuria, nocturia,  HEMATOLOGY: No anemia, easy bruising or bleeding SKIN: No rash or lesion. MUSCULOSKELETAL: No joint pain or arthritis.   NEUROLOGIC: No tingling, numbness, weakness.  PSYCHIATRY: No anxiety or depression.   MEDICATIONS AT HOME:  Prior to Admission medications   Medication Sig Start Date End Date Taking? Authorizing Provider  alfuzosin (UROXATRAL) 10 MG 24 hr tablet Take 10 mg by mouth daily with breakfast.    [provider]  ascorbic acid (VITAMIN C) 1000 MG tablet Take 1,000 mg by mouth daily.    [provider]  aspirin 81 MG tablet Take 81  mg by mouth daily.    [provider]  atorvastatin (LIPITOR) 80 MG tablet Take 80 mg by mouth daily.    [provider]  budesonide-formoterol (SYMBICORT) 160-4.5 MCG/ACT inhaler Inhale into the lungs. 01/31/18 01/31/19  [provider]  doxycycline (VIBRAMYCIN) 100 MG capsule Take 1 capsule (100 mg total) by mouth every 12 (twelve) hours. 05/20/18   Stoioff, Ronda Fairly, MD  feeding supplement, ENSURE ENLIVE, (ENSURE ENLIVE) LIQD Take 237 mLs by mouth 3 (three) times daily. 01/18/16   Hower, Aaron Mose, MD  ferrous sulfate 325 (65 FE) MG tablet Take 325 mg by mouth daily with breakfast.    [provider]  finasteride  (PROSCAR) 5 MG tablet Take 5 mg by mouth daily.    [provider]  Fluticasone-Salmeterol (ADVAIR) 250-50 MCG/DOSE AEPB Inhale 1 puff into the lungs 2 (two) times daily.    [provider]  gabapentin (NEURONTIN) 300 MG capsule Take 900 mg by mouth every evening.    [provider]  Ipratropium-Albuterol (COMBIVENT) 20-100 MCG/ACT AERS respimat Inhale 1 puff into the lungs 2 (two) times daily.    [provider]  lisinopril (PRINIVIL,ZESTRIL) 40 MG tablet Take 40 mg by mouth daily.    [provider]  magnesium oxide (MAG-OX) 400 MG tablet Take 400 mg by mouth daily.    [provider]  Melatonin 5 MG TABS Take by mouth.    [provider]  Multiple Vitamin (MULTI-VITAMINS) TABS Take by mouth.    [provider]  omeprazole (PRILOSEC) 20 MG capsule Take 20 mg by mouth daily.    [provider]  traMADol (ULTRAM) 50 MG tablet Take by mouth every 6 (six) hours as needed.    [provider]  triamcinolone cream (KENALOG) 0.1 % Apply 1 application topically 2 (two) times daily.    [provider]  verapamil (CALAN) 120 MG tablet Take 120 mg by mouth daily.     [provider]  zolpidem (AMBIEN) 10 MG tablet Take 10 mg by mouth at bedtime as needed for sleep.    [provider]      PHYSICAL EXAMINATION:   VITAL SIGNS: Blood pressure (!) 160/65, pulse 100, temperature 98.5 F (36.9 C), temperature source Oral, resp. rate (!) 21, height 6' (1.829 m), weight 81.6 kg, SpO2 96 %.  GENERAL:  84 y.o.-year-old patient lying in the bed with no acute distress.  EYES: Pupils equal, round, reactive to light and accommodation. No scleral icterus. Extraocular muscles intact.  HEENT: Head atraumatic, normocephalic. Oropharynx and nasopharynx clear.  Hearing aid in place. NECK:  Supple, no jugular venous distention. No thyroid enlargement, no tenderness.  LUNGS: Normal breath sounds bilaterally,  no wheezing, rales,rhonchi or crepitation. No use of accessory muscles of respiration.  CARDIOVASCULAR: S1, S2 normal. No murmurs, rubs, or gallops.  ABDOMEN: Soft, nontender, nondistended. Bowel sounds present. No organomegaly or mass.  EXTREMITIES: No pedal edema, cyanosis, or clubbing.  NEUROLOGIC: Cranial nerves II through XII are intact. Muscle strength 4/5 in all extremities. Sensation intact. Gait not checked.  PSYCHIATRIC: The patient is alert and oriented x 3.  SKIN: No obvious rash, lesion, or ulcer.   LABORATORY PANEL:   CBC Recent Labs  Lab 08/11/18 1644  WBC 9.7  HGB 11.1*  HCT 33.0*  PLT 151  MCV 98.2  MCH 33.0  MCHC 33.6  RDW 13.1  LYMPHSABS 0.3*  MONOABS 0.2  EOSABS 0.0  BASOSABS 0.0   ------------------------------------------------------------------------------------------------------------------  Chemistries  Recent Labs  Lab 08/11/18 1644  NA 137  K 3.9  CL 100  CO2 27  GLUCOSE 113*  BUN 19  CREATININE 1.30*  CALCIUM 9.3  AST 27  ALT 22  ALKPHOS 75  BILITOT 0.7   ------------------------------------------------------------------------------------------------------------------ estimated creatinine clearance is 42.3 mL/min (A) (by C-G formula based on SCr of 1.3 mg/dL (H)). ------------------------------------------------------------------------------------------------------------------ No results for input(s): TSH, T4TOTAL, T3FREE, THYROIDAB in the last 72 hours.  Invalid input(s): FREET3   Coagulation profile No results for input(s): INR, PROTIME in the last 168 hours. ------------------------------------------------------------------------------------------------------------------- No results for input(s): DDIMER in the last 72 hours. -------------------------------------------------------------------------------------------------------------------  Cardiac Enzymes Recent Labs  Lab 08/11/18 1644  TROPONINI <0.03    ------------------------------------------------------------------------------------------------------------------ Invalid input(s): POCBNP  ---------------------------------------------------------------------------------------------------------------  Urinalysis    Component Value Date/Time   COLORURINE YELLOW (A) 08/11/2018 1644   APPEARANCEUR CLOUDY (A) 08/11/2018 1644   APPEARANCEUR Clear 04/17/2018 1411   LABSPEC 1.009 08/11/2018 1644   LABSPEC 1.017 09/29/2012 2058   PHURINE 7.0 08/11/2018 [place] 08/11/2018 1644   GLUCOSEU Negative 09/29/2012 2058   HGBUR NEGATIVE 08/11/2018 1644   BILIRUBINUR NEGATIVE 08/11/2018 1644   BILIRUBINUR Negative 04/17/2018 1411   BILIRUBINUR Negative 09/29/2012 2058   KETONESUR NEGATIVE 08/11/2018 1644   PROTEINUR NEGATIVE 08/11/2018 1644   NITRITE NEGATIVE 08/11/2018 1644   LEUKOCYTESUR LARGE (A) 08/11/2018 1644   LEUKOCYTESUR Negative 04/17/2018 1411   LEUKOCYTESUR Negative 09/29/2012 2058     RADIOLOGY: Dg Chest Portable 1 View  Result Date: 08/11/2018 CLINICAL DATA:  Weakness. EXAM: PORTABLE CHEST 1 VIEW COMPARISON:  Chest radiograph 01/12/2016 FINDINGS: Monitoring leads overlie the patient. Normal cardiac and mediastinal contours. Aortic atherosclerosis. Bilateral calcified pleural plaques. No large area pulmonary consolidation. No pleural effusion pneumothorax. IMPRESSION: No acute cardiopulmonary process. Calcified pleural plaques. Electronically Signed   By: Lovey Newcomer M.D.   On: 08/11/2018 17:25    EKG: Orders placed or performed during the hospital encounter of 08/11/18  . ED EKG  . ED EKG  . ED EKG 12-Lead  . ED EKG 12-Lead    IMPRESSION AND PLAN:  *Acute UTI Due to frequent self-catheterization IV ceftriaxone for now and follow cultures.  *Chronic kidney disease stage III Appears at baseline, continue to monitor.  *Chronic systolic congestive heart failure Ejection fraction 35 to 40%, monitor  for fluid overload.  *Hypertension With having UTI, I would hold lisinopril but continue verapamil.  *Hyperlipidemia Continue atorvastatin.  *COPD Continue Symbicort.  *BPH We may have to continue doing in and out catheter 3 times a day and continue Proscar.  All the records are reviewed and case discussed with ED provider. Management plans discussed with the patient, family and they are in agreement.  CODE STATUS: Full Code Status History    Date Active Date Inactive Code Status Order ID Comments User Context   01/10/2016 2016 01/18/2016 1943 Full Code [id number]  Henreitta Leber, MD Inpatient    Advance Directive Documentation     Most Recent Value  Type of Advance Directive  Living will, Healthcare Power of Attorney, Out of facility DNR (pink MOST or yellow form)  Pre-existing out of facility DNR order (yellow form or pink MOST form)  -  "MOST" Form in Place?  -      Discussed with patient's daughter in the room. TOTAL TIME TAKING CARE OF THIS PATIENT: 50 minutes.    Vaughan Basta M.D on 08/11/2018   Between 7am to 6pm - Pager - [phone]  After 6pm  go to [email or web address] - password [email or web address]  Office  [phone]  CC: Primary care physician; Derinda Late, MD   Note: This dictation was prepared with Dragon dictation along with smaller phrase technology. Any transcriptional errors that result from this process are unintentional.

## 2018-08-11 NOTE — Consult Note (Signed)
CODE SEPSIS - PHARMACY COMMUNICATION  **Broad Spectrum Antibiotics should be administered within 1 hour of Sepsis diagnosis**  Time Code Sepsis Called/Page Received: 1726   Antibiotics Ordered:ceftriaxone  Time of 1st antibiotic administration: 1740  Additional action taken by pharmacy: N/A If necessary, Name of Provider/Nurse Contacted: [place] ,PharmD Clinical Pharmacist  08/11/2018  5:37 PM

## 2018-08-11 NOTE — Progress Notes (Signed)
Family Meeting Note  Advance Directive:yes  Today a meeting took place with the Patient and daughter.   The following clinical team members were present during this meeting:MD  The following were discussed:Patient's diagnosis: UTI, self-catheterization, chronic systolic congestive heart failure, chronic kidney disease stage III, hypertension, Patient's progosis: Unable to determine and Goals for treatment: Full Code  Additional follow-up to be provided: PMD  Time spent during discussion:20 minutes  Vaughan Basta, MD

## 2018-08-12 LAB — BLOOD CULTURE ID PANEL (REFLEXED)
ACINETOBACTER BAUMANNII: NOT DETECTED
CANDIDA KRUSEI: NOT DETECTED
CARBAPENEM RESISTANCE: NOT DETECTED
Candida albicans: NOT DETECTED
Candida glabrata: NOT DETECTED
Candida parapsilosis: NOT DETECTED
Candida tropicalis: NOT DETECTED
ENTEROCOCCUS SPECIES: NOT DETECTED
Enterobacter cloacae complex: NOT DETECTED
Enterobacteriaceae species: DETECTED — AB
Escherichia coli: DETECTED — AB
Haemophilus influenzae: NOT DETECTED
KLEBSIELLA OXYTOCA: NOT DETECTED
Klebsiella pneumoniae: DETECTED — AB
LISTERIA MONOCYTOGENES: NOT DETECTED
Neisseria meningitidis: NOT DETECTED
Proteus species: NOT DETECTED
Pseudomonas aeruginosa: NOT DETECTED
SERRATIA MARCESCENS: NOT DETECTED
STAPHYLOCOCCUS AUREUS BCID: NOT DETECTED
STAPHYLOCOCCUS SPECIES: NOT DETECTED
STREPTOCOCCUS AGALACTIAE: NOT DETECTED
STREPTOCOCCUS PNEUMONIAE: NOT DETECTED
Streptococcus pyogenes: NOT DETECTED
Streptococcus species: NOT DETECTED

## 2018-08-12 LAB — CBC
HCT: 29.1 % — ABNORMAL LOW (ref 39.0–52.0)
HEMOGLOBIN: 9.6 g/dL — AB (ref 13.0–17.0)
MCH: 32.5 pg (ref 26.0–34.0)
MCHC: 33 g/dL (ref 30.0–36.0)
MCV: 98.6 fL (ref 80.0–100.0)
Platelets: 127 10*3/uL — ABNORMAL LOW (ref 150–400)
RBC: 2.95 MIL/uL — ABNORMAL LOW (ref 4.22–5.81)
RDW: 13.2 % (ref 11.5–15.5)
WBC: 12.4 10*3/uL — ABNORMAL HIGH (ref 4.0–10.5)
nRBC: 0 % (ref 0.0–0.2)

## 2018-08-12 LAB — BASIC METABOLIC PANEL
Anion gap: 6 (ref 5–15)
BUN: 19 mg/dL (ref 8–23)
CALCIUM: 9 mg/dL (ref 8.9–10.3)
CO2: 27 mmol/L (ref 22–32)
CREATININE: 1.31 mg/dL — AB (ref 0.61–1.24)
Chloride: 102 mmol/L (ref 98–111)
GFR calc Af Amer: 54 mL/min — ABNORMAL LOW (ref >60)
GFR, EST NON AFRICAN AMERICAN: 47 mL/min — AB (ref >60)
GLUCOSE: 115 mg/dL — AB (ref 70–99)
Potassium: 4 mmol/L (ref 3.5–5.1)
Sodium: 135 mmol/L (ref 135–145)

## 2018-08-12 MED ORDER — ACETAMINOPHEN 325 MG PO TABS
650.0000 mg | ORAL_TABLET | Freq: Four times a day (QID) | ORAL | Status: DC | PRN
  Administered 2018-08-12: 650 mg via ORAL
  Filled 2018-08-12: qty 2

## 2018-08-12 MED ORDER — POLYETHYLENE GLYCOL 3350 17 G PO PACK: 17.0000 g | PACK | Freq: Every day | ORAL | Status: DC

## 2018-08-12 MED ORDER — BISACODYL 10 MG RE SUPP
10.0000 mg | Freq: Once | RECTAL | Status: AC
  Administered 2018-08-12: 10 mg via RECTAL
  Filled 2018-08-12: qty 1

## 2018-08-12 MED ORDER — SENNOSIDES-DOCUSATE SODIUM 8.6-50 MG PO TABS: 2.0000 | ORAL_TABLET | Freq: Two times a day (BID) | ORAL | Status: DC

## 2018-08-12 MED ORDER — SODIUM CHLORIDE 0.9 % IV SOLN
2.0000 g | INTRAVENOUS | Status: DC
  Administered 2018-08-12: 2 g via INTRAVENOUS
  Filled 2018-08-12: qty 20
  Filled 2018-08-12: qty 2

## 2018-08-12 MED ORDER — BISACODYL 5 MG PO TBEC
15.0000 mg | DELAYED_RELEASE_TABLET | Freq: Once | ORAL | Status: AC
  Administered 2018-08-12: 15 mg via ORAL
  Filled 2018-08-12: qty 3

## 2018-08-12 MED ORDER — SODIUM CHLORIDE 0.9 % IV SOLN
1.0000 g | Freq: Once | INTRAVENOUS | Status: AC
  Administered 2018-08-12: 1 g via INTRAVENOUS
  Filled 2018-08-12: qty 1

## 2018-08-12 MED ORDER — LISINOPRIL 10 MG PO TABS
10.0000 mg | ORAL_TABLET | Freq: Every day | ORAL | Status: DC
  Administered 2018-08-12 – 2018-08-15 (×4): 10 mg via ORAL
  Filled 2018-08-12 (×4): qty 1

## 2018-08-12 NOTE — Progress Notes (Signed)
Paged Dr. Brett Albino re: patient requesting something stronger for constipation.

## 2018-08-12 NOTE — Progress Notes (Signed)
PHARMACY - PHYSICIAN COMMUNICATION CRITICAL VALUE ALERT - BLOOD CULTURE IDENTIFICATION (BCID)  Lonnie [name]. is an 84 y.o. male who presented to [facility] on 08/11/2018 with a chief complaint of urinary frequency  Assessment:  Patient w/ a PMH of urinary retention who presents with urinary frequency, chills, weakness. Pt has a klebsiella UTI sensitive to ceftriaxone in July. Now growing klebsiella and ecoli in blood. Source most likely urine.  Name of physician (or Provider) Contacted: Bridgett Larsson  Current antibiotics: ceftriaxone 1g q 24hr  Changes to prescribed antibiotics recommended:  Pt w/out history of MDR organisims. increase ceftriaxone to 2g q 24hr. Will give and extra 1g now as pt not due for next dose till this evening  Results for orders placed or performed during the hospital encounter of 08/11/18  Blood Culture ID Panel (Reflexed) (Collected: 08/11/2018  5:26 PM)  Result Value Ref Range   Enterococcus species NOT DETECTED NOT DETECTED   Listeria monocytogenes NOT DETECTED NOT DETECTED   Staphylococcus species NOT DETECTED NOT DETECTED   Staphylococcus aureus (BCID) NOT DETECTED NOT DETECTED   Streptococcus species NOT DETECTED NOT DETECTED   Streptococcus agalactiae NOT DETECTED NOT DETECTED   Streptococcus pneumoniae NOT DETECTED NOT DETECTED   Streptococcus pyogenes NOT DETECTED NOT DETECTED   Acinetobacter baumannii NOT DETECTED NOT DETECTED   Enterobacteriaceae species DETECTED (A) NOT DETECTED   Enterobacter cloacae complex NOT DETECTED NOT DETECTED   Escherichia coli DETECTED (A) NOT DETECTED   Klebsiella oxytoca NOT DETECTED NOT DETECTED   Klebsiella pneumoniae DETECTED (A) NOT DETECTED   Proteus species NOT DETECTED NOT DETECTED   Serratia marcescens NOT DETECTED NOT DETECTED   Carbapenem resistance NOT DETECTED NOT DETECTED   Haemophilus influenzae NOT DETECTED NOT DETECTED   Neisseria meningitidis NOT DETECTED NOT DETECTED   Pseudomonas aeruginosa NOT  DETECTED NOT DETECTED   Candida albicans NOT DETECTED NOT DETECTED   Candida glabrata NOT DETECTED NOT DETECTED   Candida krusei NOT DETECTED NOT DETECTED   Candida parapsilosis NOT DETECTED NOT DETECTED   Candida tropicalis NOT DETECTED NOT DETECTED   Nayel Purdy D Sheena Donegan, Pharm.D, BCPS Clinical Pharmacist 08/12/2018  7:29 AM

## 2018-08-12 NOTE — Progress Notes (Signed)
[place] [facility] [place] NAME: Vedansh Kerstetter    MR#:  [id number]  DATE OF BIRTH:  12/27/1926  SUBJECTIVE:  CHIEF COMPLAINT:   Chief Complaint  Patient presents with  . Weakness   The patient has no complaints. REVIEW OF SYSTEMS:  Review of Systems  Constitutional: Negative for chills, fever and malaise/fatigue.  HENT: Negative for sore throat.   Eyes: Negative for blurred vision and double vision.  Respiratory: Negative for cough, hemoptysis, shortness of breath, wheezing and stridor.   Cardiovascular: Negative for chest pain, palpitations, orthopnea and leg swelling.  Gastrointestinal: Negative for abdominal pain, blood in stool, diarrhea, melena, nausea and vomiting.  Genitourinary: Negative for dysuria, flank pain and hematuria.  Musculoskeletal: Negative for back pain and joint pain.  Skin: Negative for rash.  Neurological: Negative for dizziness, sensory change, focal weakness, seizures, loss of consciousness, weakness and headaches.  Endo/Heme/Allergies: Negative for polydipsia.  Psychiatric/Behavioral: Negative for depression. The patient is not nervous/anxious.     DRUG ALLERGIES:   Allergies  Allergen Reactions  . Sulfa Antibiotics Other (See Comments)    Not sure of reaction. It was too long ago, but he said not to give it to him.   . Sulfasalazine     Other reaction(s): Other (See Comments) Not sure of reaction. It was too long ago, but he said not to give it to him.    VITALS:  Blood pressure (!) 157/64, pulse 69, temperature 97.9 F (36.6 C), temperature source Oral, resp. rate 16, height 6' (1.829 m), weight 81.6 kg, SpO2 99 %. PHYSICAL EXAMINATION:  Physical Exam  Constitutional: He is oriented to person, place, and time. No distress.  HENT:  Head: Normocephalic.  Mouth/Throat: Oropharynx is clear and moist.  Eyes: Pupils are equal, round, and reactive to light. Conjunctivae and EOM are normal. No scleral icterus.   Neck: Normal range of motion. Neck supple. No JVD present. No tracheal deviation present.  Cardiovascular: Normal rate, regular rhythm and normal heart sounds. Exam reveals no gallop.  No murmur heard. Pulmonary/Chest: Effort normal and breath sounds normal. No respiratory distress. He has no wheezes. He has no rales.  Abdominal: Soft. Bowel sounds are normal. He exhibits no distension. There is no tenderness. There is no rebound.  Musculoskeletal: Normal range of motion. He exhibits no edema or tenderness.  Neurological: He is alert and oriented to person, place, and time. No cranial nerve deficit.  Skin: No rash noted. No erythema.  Psychiatric: He has a normal mood and affect.   LABORATORY PANEL:  Male CBC Recent Labs  Lab 08/12/18 0610  WBC 12.4*  HGB 9.6*  HCT 29.1*  PLT 127*   ------------------------------------------------------------------------------------------------------------------ Chemistries  Recent Labs  Lab 08/11/18 1644 08/12/18 0610  NA 137 135  K 3.9 4.0  CL 100 102  CO2 27 27  GLUCOSE 113* 115*  BUN 19 19  CREATININE 1.30* 1.31*  CALCIUM 9.3 9.0  AST 27  --   ALT 22  --   ALKPHOS 75  --   BILITOT 0.7  --    RADIOLOGY:  Dg Chest Portable 1 View  Result Date: 08/11/2018 CLINICAL DATA:  Weakness. EXAM: PORTABLE CHEST 1 VIEW COMPARISON:  Chest radiograph 01/12/2016 FINDINGS: Monitoring leads overlie the patient. Normal cardiac and mediastinal contours. Aortic atherosclerosis. Bilateral calcified pleural plaques. No large area pulmonary consolidation. No pleural effusion pneumothorax. IMPRESSION: No acute cardiopulmonary process. Calcified pleural plaques. Electronically Signed   By: Dian Situ  Rosana Hoes M.D.   On: 08/11/2018 17:25   ASSESSMENT AND PLAN:   *Acute UTI and bacteremia. Due to frequent self-catheterization Increase IV ceftriaxone 2 g daily.  Blood cultures show Klebsiella pneumoniae and E. coli.  Follow-up blood culture and urine culture.  ID  consult.  *Chronic kidney disease stage III Appears at baseline, continue to monitor.  *Chronic systolic congestive heart failure, Ejection fraction 35 to 40%, monitor for fluid overload.  *Hypertension Resume lisinopril, continue verapamil.  *Hyperlipidemia Continue atorvastatin.  *COPD Continue Symbicort.  *BPH We may have to continue doing in and out catheter 3 times a day and continue Proscar.  All the records are reviewed and case discussed with Care Management/Social Worker. Management plans discussed with the patient, his daughter and they are in agreement.  CODE STATUS: Full Code  TOTAL TIME TAKING CARE OF THIS PATIENT: 35 minutes.   More than 50% of the time was spent in counseling/coordination of care: YES  POSSIBLE D/C IN 2 DAYS, DEPENDING ON CLINICAL CONDITION.   Demetrios Loll M.D on 08/12/2018 at 2:28 PM  Between 7am to 6pm - Pager - [phone]  After 6pm go to [email or web address] - [facility]

## 2018-08-13 LAB — CBC
HEMATOCRIT: 30.1 % — AB (ref 39.0–52.0)
Hemoglobin: 10 g/dL — ABNORMAL LOW (ref 13.0–17.0)
MCH: 33.2 pg (ref 26.0–34.0)
MCHC: 33.2 g/dL (ref 30.0–36.0)
MCV: 100 fL (ref 80.0–100.0)
Platelets: 126 10*3/uL — ABNORMAL LOW (ref 150–400)
RBC: 3.01 MIL/uL — ABNORMAL LOW (ref 4.22–5.81)
RDW: 13.2 % (ref 11.5–15.5)
WBC: 8.6 10*3/uL (ref 4.0–10.5)
nRBC: 0 % (ref 0.0–0.2)

## 2018-08-13 LAB — BASIC METABOLIC PANEL
Anion gap: 9 (ref 5–15)
BUN: 20 mg/dL (ref 8–23)
CHLORIDE: 100 mmol/L (ref 98–111)
CO2: 26 mmol/L (ref 22–32)
CREATININE: 1.38 mg/dL — AB (ref 0.61–1.24)
Calcium: 9.2 mg/dL (ref 8.9–10.3)
GFR calc Af Amer: 51 mL/min — ABNORMAL LOW (ref >60)
GFR calc non Af Amer: 44 mL/min — ABNORMAL LOW (ref >60)
Glucose, Bld: 110 mg/dL — ABNORMAL HIGH (ref 70–99)
Potassium: 3.8 mmol/L (ref 3.5–5.1)
SODIUM: 135 mmol/L (ref 135–145)

## 2018-08-13 MED ORDER — HYDRALAZINE HCL 20 MG/ML IJ SOLN
10.0000 mg | INTRAMUSCULAR | Status: DC | PRN
  Administered 2018-08-13: 10 mg via INTRAVENOUS
  Filled 2018-08-13: qty 1

## 2018-08-13 MED ORDER — HYDROCHLOROTHIAZIDE 25 MG PO TABS
25.0000 mg | ORAL_TABLET | Freq: Every day | ORAL | Status: DC
  Administered 2018-08-13 – 2018-08-15 (×3): 25 mg via ORAL
  Filled 2018-08-13 (×3): qty 1

## 2018-08-13 MED ORDER — SODIUM CHLORIDE 0.9 % IV SOLN
1.0000 g | Freq: Two times a day (BID) | INTRAVENOUS | Status: DC
  Administered 2018-08-13 – 2018-08-14 (×3): 1 g via INTRAVENOUS
  Filled 2018-08-13 (×5): qty 1

## 2018-08-13 MED ORDER — SALINE SPRAY 0.65 % NA SOLN
1.0000 | NASAL | Status: DC | PRN
  Administered 2018-08-13: 1 via NASAL
  Filled 2018-08-13: qty 44

## 2018-08-13 NOTE — Evaluation (Signed)
Physical Therapy Evaluation Patient Details Name: Lonnie [name]. MRN: [id number] DOB: 06/15/1927 Today's Date: 08/13/2018   History of Present Illness  Pt is a 84 y/o F who presented with increased urinary frequency, generalized weakness, and chills.  Pt found to have UTI.  Pt's PMH includes L bundle branch block, COPD.     Clinical Impression  Pt admitted with above diagnosis. Pt currently with functional limitations due to the deficits listed below (see PT Problem List). Lonnie [name] was very pleasant and agreeable to therapy.  Given pt's instability with balance testing (as documented below), encouraged pt to participate in OPPT to specifically address balance.  Pt reports having PT for strength andbalance ~4 months ago and declines additional therapy at this time saying, "I don't want to spend my days doing therapy".  Encouraged pt to ambulate with SPC at all times when outside given his h/o falls occurring specifically when outside.  Pt will benefit from skilled PT to increase their independence and safety with mobility to allow discharge to the venue listed below.      Follow Up Recommendations Outpatient PT(although pt declining at this time)    Equipment Recommendations  None recommended by PT    Recommendations for Other Services       Precautions / Restrictions Precautions Precautions: Fall Restrictions Weight Bearing Restrictions: No      Mobility  Bed Mobility Overal bed mobility: Independent             General bed mobility comments: No physical assist or cues needed.  Transfers Overall transfer level: Modified independent Equipment used: None             General transfer comment: Mild instability noted but no LOB  Ambulation/Gait Ambulation/Gait assistance: Supervision Gait Distance (Feet): 400 Feet Assistive device: None Gait Pattern/deviations: Step-through pattern     General Gait Details: Pt demonstrates mild instability initially which  improves as he continues to ambulate.  Instability specifically returns when making turns or with higher level balance activities as documented below.   Stairs            Wheelchair Mobility    Modified Rankin (Stroke Patients Only)       Balance Overall balance assessment: Needs assistance Sitting-balance support: No upper extremity supported;Feet supported Sitting balance-Leahy Scale: Good     Standing balance support: No upper extremity supported;During functional activity Standing balance-Leahy Scale: Good               High level balance activites: Direction changes;Turns;Sudden stops;Head turns High Level Balance Comments: Instability noted with higher level balance activities Standardized Balance Assessment Standardized Balance Assessment : Berg Balance Test;Dynamic Gait Index Berg Balance Test Sit to Stand: Able to stand without using hands and stabilize independently Standing Unsupported: Able to stand safely 2 minutes Sitting with Back Unsupported but Feet Supported on Floor or Stool: Able to sit safely and securely 2 minutes Stand to Sit: Sits safely with minimal use of hands Transfers: Able to transfer safely, minor use of hands Standing Unsupported with Eyes Closed: Able to stand 10 seconds safely Standing Ubsupported with Feet Together: Able to place feet together independently and stand for 1 minute with supervision Standing Unsupported, One Foot in Front: Able to plae foot ahead of the other independently and hold 30 seconds Standing on One Leg: Unable to try or needs assist to prevent fall Dynamic Gait Index Level Surface: Mild Impairment Change in Gait Speed: Normal Gait with Horizontal Head Turns: Mild Impairment  Gait with Vertical Head Turns: Mild Impairment Gait and Pivot Turn: Normal Step Over Obstacle: Mild Impairment Step Around Obstacles: Normal       Pertinent Vitals/Pain Pain Assessment: No/denies pain    Home Living  Family/patient expects to be discharged to:: Private residence Living Arrangements: Alone Available Help at Discharge: Family;Neighbor;Available PRN/intermittently(two daughters who live within 15-20 mins) Type of Home: House Home Access: Stairs to enter Entrance Stairs-Rails: Chemical engineer of Steps: 3 Home Layout: One level Home Equipment: Cane - single point;Grab bars - tub/shower;Grab bars - toilet;Shower seat;Hand held Tourist information centre manager - 4 wheels      Prior Function Level of Independence: Independent with assistive device(s)         Comments: Pt uses SPC on occasion, typically toward the end of the day when he begins feeling more tired.  Pt cutting the grass, washing the car, etc PTA.  2 falls in the past 6 months of which both occurred when working outside.      Hand Dominance        Extremity/Trunk Assessment   Upper Extremity Assessment Upper Extremity Assessment: Overall WFL for tasks assessed    Lower Extremity Assessment Lower Extremity Assessment: Overall WFL for tasks assessed    Cervical / Trunk Assessment Cervical / Trunk Assessment: Kyphotic(mildly)  Communication   Communication: No difficulties  Cognition Arousal/Alertness: Awake/alert Behavior During Therapy: WFL for tasks assessed/performed Overall Cognitive Status: Within Functional Limits for tasks assessed                                        General Comments General comments (skin integrity, edema, etc.): Given pt's instability with balance testing encouraged pt to participate in OPPT to specifically address balance.  Pt reports having PT for strength andbalance ~4 months ago and declines additional therapy at this time saying, "I don't want to spend my days doing therapy".  Encouraged pt to ambulate with SPC at all times when outside given his h/o falls occurring specifically when outside.     Exercises     Assessment/Plan    PT Assessment Patient  needs continued PT services  PT Problem List Decreased balance;Decreased safety awareness       PT Treatment Interventions DME instruction;Gait training;Balance training;Neuromuscular re-education;Patient/family education;Therapeutic activities;Therapeutic exercise    PT Goals (Current goals can be found in the Care Plan section)  Acute Rehab PT Goals Patient Stated Goal: to return home  PT Goal Formulation: With patient Time For Goal Achievement: 08/27/18 Potential to Achieve Goals: Good    Frequency Min 2X/week   Barriers to discharge        Co-evaluation               AM-PAC PT "6 Clicks" Daily Activity  Outcome Measure Difficulty turning over in bed (including adjusting bedclothes, sheets and blankets)?: None Difficulty moving from lying on back to sitting on the side of the bed? : None Difficulty sitting down on and standing up from a chair with arms (e.g., wheelchair, bedside commode, etc,.)?: A Little Help needed moving to and from a bed to chair (including a wheelchair)?: A Little Help needed walking in hospital room?: A Little Help needed climbing 3-5 steps with a railing? : A Little 6 Click Score: 20    End of Session Equipment Utilized During Treatment: Gait belt Activity Tolerance: Patient tolerated treatment well Patient left: in bed;with call bell/phone  within reach;with bed alarm set Nurse Communication: Mobility status PT Visit Diagnosis: Unsteadiness on feet (R26.81);History of falling (Z91.81)    Time: 2824-1753 PT Time Calculation (min) (ACUTE ONLY): 25 min   Charges:   PT Evaluation $PT Eval Low Complexity: 1 Low PT Treatments $Neuromuscular Re-education: 8-22 mins       Collie Siad PT, DPT 08/13/2018, 5:02 PM

## 2018-08-13 NOTE — Progress Notes (Signed)
[place] at [place] NAME: Lonnie [name]    MR#:  [id number]  DATE OF BIRTH:  Aug 30, 1927  SUBJECTIVE:  CHIEF COMPLAINT:   Chief Complaint  Patient presents with  . Weakness   Afebrile.  No abdominal pain.  Does in and out catheterization of the urinary bladder twice a day at home  REVIEW OF SYSTEMS:  Review of Systems  Constitutional: Negative for chills, fever and malaise/fatigue.  HENT: Negative for sore throat.   Eyes: Negative for blurred vision and double vision.  Respiratory: Negative for cough, hemoptysis, shortness of breath, wheezing and stridor.   Cardiovascular: Negative for chest pain, palpitations, orthopnea and leg swelling.  Gastrointestinal: Negative for abdominal pain, blood in stool, diarrhea, melena, nausea and vomiting.  Genitourinary: Negative for dysuria, flank pain and hematuria.  Musculoskeletal: Negative for back pain and joint pain.  Skin: Negative for rash.  Neurological: Negative for dizziness, sensory change, focal weakness, seizures, loss of consciousness, weakness and headaches.  Endo/Heme/Allergies: Negative for polydipsia.  Psychiatric/Behavioral: Negative for depression. The patient is not nervous/anxious.     DRUG ALLERGIES:   Allergies  Allergen Reactions  . Sulfa Antibiotics Other (See Comments)    Not sure of reaction. It was too long ago, but he said not to give it to him.   . Sulfasalazine     Other reaction(s): Other (See Comments) Not sure of reaction. It was too long ago, but he said not to give it to him.    VITALS:  Blood pressure (!) 177/75, pulse 70, temperature 97.8 F (36.6 C), temperature source Oral, resp. rate 16, height 6' (1.829 m), weight 81.6 kg, SpO2 97 %. PHYSICAL EXAMINATION:  Physical Exam  Constitutional: He is oriented to person, place, and time. No distress.  HENT:  Head: Normocephalic.  Mouth/Throat: Oropharynx is clear and moist.  Eyes: Pupils are equal,  round, and reactive to light. Conjunctivae and EOM are normal. No scleral icterus.  Neck: Normal range of motion. Neck supple. No JVD present. No tracheal deviation present.  Cardiovascular: Normal rate, regular rhythm and normal heart sounds. Exam reveals no gallop.  No murmur heard. Pulmonary/Chest: Effort normal and breath sounds normal. No respiratory distress. He has no wheezes. He has no rales.  Abdominal: Soft. Bowel sounds are normal. He exhibits no distension. There is no tenderness. There is no rebound.  Musculoskeletal: Normal range of motion. He exhibits no edema or tenderness.  Neurological: He is alert and oriented to person, place, and time. No cranial nerve deficit.  Skin: No rash noted. No erythema.  Psychiatric: He has a normal mood and affect.   LABORATORY PANEL:  Male CBC Recent Labs  Lab 08/13/18 0758  WBC 8.6  HGB 10.0*  HCT 30.1*  PLT 126*   ------------------------------------------------------------------------------------------------------------------ Chemistries  Recent Labs  Lab 08/11/18 1644  08/13/18 0758  NA 137   < > 135  K 3.9   < > 3.8  CL 100   < > 100  CO2 27   < > 26  GLUCOSE 113*   < > 110*  BUN 19   < > 20  CREATININE 1.30*   < > 1.38*  CALCIUM 9.3   < > 9.2  AST 27  --   --   ALT 22  --   --   ALKPHOS 75  --   --   BILITOT 0.7  --   --    < > = values in  this interval not displayed.   RADIOLOGY:  No results found. ASSESSMENT AND PLAN:   *E. coli and Klebsiella UTI and bacteremia. Catheter related On ceftriaxone 2 g daily.  Improving.  WBC normal.  Will wait for final sensitivities. Likely discharge home tomorrow on oral antibiotics for 10 days  *Chronic kidney disease stage III Appears at baseline, continue to monitor.  *Chronic systolic congestive heart failure, Ejection fraction 35 to 40%, monitor for fluid overload.  *Hypertension Lisinopril and verapamil.  *Hyperlipidemia Continue  atorvastatin.  *COPD Continue Symbicort.  *BPH In and out bladder catheterization twice a day.  All the records are reviewed and case discussed with Care Management/Social Worker. Management plans discussed with the patient, his daughter and they are in agreement.  CODE STATUS: Full Code  TOTAL TIME TAKING CARE OF THIS PATIENT: 35 minutes.   POSSIBLE D/C IN 2 DAYS, DEPENDING ON CLINICAL CONDITION.  Neita Carp M.D on 08/13/2018 at 12:06 PM  Between 7am to 6pm - Pager - [phone]  After 6pm go to [email or web address] - [facility]

## 2018-08-13 NOTE — Progress Notes (Signed)
Pharmacy Antibiotic Note  Lonnie [name]. is a 84 y.o. male admitted on 08/11/2018 with bacteremia.  Pharmacy has been consulted for meropenem dosing.  ID seeing patient asn changing ceftriaxone to meropenem   Today, 08/13/2018  WBC WNL  SCr elevated, stable  Blood cx with K. Pneumoniae and E. Coli   Urine cx with K. Pneumoniae  Plan:  Based on renal function, meropenem 1gm IV q12h  Await culture results  Follow renal function   Height: 6' (182.9 cm) Weight: 180 lb (81.6 kg) IBW/kg (Calculated) : 77.6  Temp (24hrs), Avg:97.9 F (36.6 C), Min:97.5 F (36.4 C), Max:98.5 F (36.9 C)  Recent Labs  Lab 08/11/18 1644 08/11/18 1936 08/12/18 0610 08/13/18 0758  WBC 9.7  --  12.4* 8.6  CREATININE 1.30*  --  1.31* 1.38*  LATICACIDVEN 2.4* 2.2*  --   --     Estimated Creatinine Clearance: 39.8 mL/min (A) (by C-G formula based on SCr of 1.38 mg/dL (H)).    Allergies  Allergen Reactions  . Sulfa Antibiotics Other (See Comments)    Not sure of reaction. It was too long ago, but he said not to give it to him.   . Sulfasalazine     Other reaction(s): Other (See Comments) Not sure of reaction. It was too long ago, but he said not to give it to him.     Antimicrobials this admission: Ceftriaxone 11/9 >> 11/11 Meropenem 11/11 >>   Dose adjustments this admission:  Microbiology results: 11/9 BCx: K. Pneumo, E. coli  Thank you for allowing pharmacy to be a part of this patient's care.  Doreene Eland, PharmD, BCPS.   Work Cell: [phone] 08/13/2018 3:22 PM

## 2018-08-14 LAB — URINE CULTURE: Culture: >=100000 — AB

## 2018-08-14 MED ORDER — SODIUM CHLORIDE 0.9 % IV SOLN
INTRAVENOUS | Status: DC | PRN
  Administered 2018-08-14: 250 mL via INTRAVENOUS

## 2018-08-14 NOTE — Care Management Important Message (Signed)
Copy of signed IM left with patient in room.  

## 2018-08-14 NOTE — Consult Note (Signed)
NAME: Lonnie [name].  DOB: Feb 12, 1927  MRN: [id number]  Date/Time: 08/14/2018 7:05 PM  chen Subjective:  REASON FOR CONSULT: bacteremia ? Lonnie [name]. is a 84 y.o. male with h/o BPH , intermittent self catheterization, LBBB, CKD, UTI in the past is admitted with increasing frequency with chills, weakness and subjective fever. Pt says he catheterizes twice a day and rest of the time he passes some urine. He did not notice any blood. He recently started having some chills and subjective fever and his neighbor a retired Marine scientist asked him to go to the ED.He was admitted to the hospital on 08/11/18.  Had  low grade fever in the hospital. With minimally elevated leucocytosis , blood cultures sent and he was started on ceftriaxone, blood culure came back as 2 gram neg rods and he is switched to meropenem and I am consulted. Pt says he is feeling better now No abdominal or flank pain. No nausea or vomiting Lives on his own with a pet dog  Past Medical History:  Diagnosis Date  . Bundle branch block, left   . COPD (chronic obstructive pulmonary disease) ([place])   . Coronary artery disease   . Hypertension   . PSA elevation   . Renal disorder     Past Surgical History:  Procedure Laterality Date  . APPENDECTOMY    . RECTAL SURGERY     x3    SH Former smoker  Family History  Problem Relation Age of Onset  . Heart attack Mother   . Heart attack Father    Allergies  Allergen Reactions  . Sulfa Antibiotics Other (See Comments)    Not sure of reaction. It was too long ago, but he said not to give it to him.   . Sulfasalazine     Other reaction(s): Other (See Comments) Not sure of reaction. It was too long ago, but he said not to give it to him.    Current Facility-Administered Medications  Medication Dose Route Frequency Provider Last Rate Last Dose  . 0.9 %  sodium chloride infusion   Intravenous PRN Hillary Bow, MD   Stopped at 08/14/18 1256  . acetaminophen (TYLENOL) tablet  650 mg  650 mg Oral Q6H PRN Amelia Jo, MD   650 mg at 08/12/18 0535  . alfuzosin (UROXATRAL) 24 hr tablet 10 mg  10 mg Oral Q breakfast Vaughan Basta, MD   10 mg at 08/14/18 0924  . aspirin EC tablet 81 mg  81 mg Oral Daily Vaughan Basta, MD   81 mg at 08/14/18 0925  . atorvastatin (LIPITOR) tablet 80 mg  80 mg Oral Q2200 Vaughan Basta, MD   80 mg at 08/13/18 1957  . docusate sodium (COLACE) capsule 100 mg  100 mg Oral BID PRN Vaughan Basta, MD   100 mg at 08/12/18 1409  . feeding supplement (ENSURE ENLIVE) (ENSURE ENLIVE) liquid 237 mL  237 mL Oral TID Vaughan Basta, MD   237 mL at 08/14/18 1656  . ferrous sulfate tablet 325 mg  325 mg Oral Q breakfast Vaughan Basta, MD   325 mg at 08/14/18 0925  . finasteride (PROSCAR) tablet 5 mg  5 mg Oral Daily Vaughan Basta, MD   5 mg at 08/14/18 0925  . gabapentin (NEURONTIN) capsule 600 mg  600 mg Oral BID Arta Silence, MD   600 mg at 08/14/18 0925  . heparin injection 5,000 Units  5,000 Units Subcutaneous Q8H Vaughan Basta, MD   5,000 Units at  08/14/18 1500  . hydrALAZINE (APRESOLINE) injection 10 mg  10 mg Intravenous Q4H PRN Salary, Montell D, MD   10 mg at 08/13/18 1900  . hydrochlorothiazide (HYDRODIURIL) tablet 25 mg  25 mg Oral Daily Hillary Bow, MD   25 mg at 08/14/18 0925  . lisinopril (PRINIVIL,ZESTRIL) tablet 10 mg  10 mg Oral Daily Demetrios Loll, MD   10 mg at 08/14/18 0925  . magnesium oxide (MAG-OX) tablet 400 mg  400 mg Oral Daily Vaughan Basta, MD   400 mg at 08/14/18 0925  . meropenem (MERREM) 1 g in sodium chloride 0.9 % 100 mL IVPB  1 g Intravenous Q12H Berton Mount, RPH   Stopped at 08/14/18 1111  . mometasone-formoterol (DULERA) 200-5 MCG/ACT inhaler 2 puff  2 puff Inhalation BID Vaughan Basta, MD   2 puff at 08/14/18 0924  . pantoprazole (PROTONIX) EC tablet 40 mg  40 mg Oral Daily Vaughan Basta, MD   40 mg at 08/14/18 0925   . sodium chloride (OCEAN) 0.65 % nasal spray 1 spray  1 spray Each Nare Q4H PRN Vaughan Basta, MD   1 spray at 08/13/18 2126  . traMADol (ULTRAM) tablet 50 mg  50 mg Oral Q6H PRN Arta Silence, MD   50 mg at 08/14/18 0939  . verapamil (CALAN) tablet 120 mg  120 mg Oral Daily Vaughan Basta, MD   120 mg at 08/14/18 0925  . vitamin C (ASCORBIC ACID) tablet 1,000 mg  1,000 mg Oral Daily Vaughan Basta, MD   1,000 mg at 08/14/18 4128     Abtx:  Anti-infectives (From admission, onward)   Start     Dose/Rate Route Frequency Ordered Stop   08/13/18 1600  meropenem (MERREM) 1 g in sodium chloride 0.9 % 100 mL IVPB     1 g 200 mL/hr over 30 Minutes Intravenous Every 12 hours 08/13/18 1517     08/12/18 2200  cefTRIAXone (ROCEPHIN) 2 g in sodium chloride 0.9 % 100 mL IVPB  Status:  Discontinued     2 g 200 mL/hr over 30 Minutes Intravenous Every 24 hours 08/12/18 0728 08/13/18 1517   08/12/18 0800  cefTRIAXone (ROCEPHIN) 1 g in sodium chloride 0.9 % 100 mL IVPB     1 g 200 mL/hr over 30 Minutes Intravenous  Once 08/12/18 0728 08/12/18 0935   08/12/18 0000  cefTRIAXone (ROCEPHIN) 1 g in sodium chloride 0.9 % 100 mL IVPB  Status:  Discontinued     1 g 200 mL/hr over 30 Minutes Intravenous Every 24 hours 08/11/18 1857 08/12/18 0728   08/11/18 1730  cefTRIAXone (ROCEPHIN) 1 g in sodium chloride 0.9 % 100 mL IVPB     1 g 200 mL/hr over 30 Minutes Intravenous  Once 08/11/18 1725 08/11/18 1813      REVIEW OF SYSTEMS:  Const: subjective fever, chills, negative weight loss Eyes: negative diplopia or visual changes, negative eye pain, left eye poor sight due to macular degeneration ENT: negative coryza, negative sore throat Resp: negative cough, hemoptysis, dyspnea Cards: negative for chest pain, palpitations, lower extremity edema GU:as above GI: Negative for abdominal pain, diarrhea, bleeding,  Has constipation Skin: negative for rash and pruritus Heme: negative for  easy bruising and gum/nose bleeding MS: negative for myalgias, arthralgias, back pain and muscle weakness Neurolo:negative for headaches, dizziness, vertigo, memory problems  Psych: negative for feelings of anxiety, depression  Endocrine: negative for thyroid, diabetes issues Allergy/Immunology- allergic to sulfa   Objective:  VITALS:  BP (!) 124/53 (  BP Location: Left Arm)   Pulse 76   Temp 97.6 F (36.4 C) (Oral)   Resp 15   Ht 6' (1.829 m)   Wt 81.6 kg   SpO2 96%   BMI 24.41 kg/m  PHYSICAL EXAM:  General: Alert, cooperative, no distress, appears young for his age Head: Normocephalic, without obvious abnormality, atraumatic. Eyes: left eye held half closed, poor vision left eye ENT Nares normal. No drainage or sinus tenderness. Lips, mucosa, and tongue normal. No Thrush Neck: Supple, symmetrical, no adenopathy, thyroid: non tender no carotid bruit and no JVD. Back: No CVA tenderness. Lungs: Clear to auscultation bilaterally. No Wheezing or Rhonchi. No rales. Heart: Regular rate and rhythm, no murmur, rub or gallop. Abdomen: Soft, non-tender,not distended. Bowel sounds normal. No masses Extremities: atraumatic, no cyanosis. No edema. No clubbing Skin: No rashes or lesions. Or bruising Lymph: Cervical, supraclavicular normal. Neurologic: Grossly non-focal Pertinent Labs Lab Results CBC    Component Value Date/Time   WBC 8.6 08/13/2018 0758   RBC 3.01 (L) 08/13/2018 0758   HGB 10.0 (L) 08/13/2018 0758   HGB 10.4 (L) 10/01/2012 0552   HCT 30.1 (L) 08/13/2018 0758   HCT 30.1 (L) 10/01/2012 0552   PLT 126 (L) 08/13/2018 0758   PLT 104 (L) 10/01/2012 0552   MCV 100.0 08/13/2018 0758   MCV 91 10/01/2012 0552   MCH 33.2 08/13/2018 0758   MCHC 33.2 08/13/2018 0758   RDW 13.2 08/13/2018 0758   RDW 13.4 10/01/2012 0552   LYMPHSABS 0.3 (L) 08/11/2018 1644   LYMPHSABS 0.6 (L) 10/01/2012 0552   MONOABS 0.2 08/11/2018 1644   MONOABS 0.6 10/01/2012 0552   EOSABS 0.0  08/11/2018 1644   EOSABS 0.0 10/01/2012 0552   BASOSABS 0.0 08/11/2018 1644   BASOSABS 0.0 10/01/2012 0552    CMP Latest Ref Rng & Units 08/13/2018 08/12/2018 08/11/2018  Glucose 70 - 99 mg/dL 110(H) 115(H) 113(H)  BUN 8 - 23 mg/dL 20 19 19   Creatinine 0.61 - 1.24 mg/dL 1.38(H) 1.31(H) 1.30(H)  Sodium 135 - 145 mmol/L 135 135 137  Potassium 3.5 - 5.1 mmol/L 3.8 4.0 3.9  Chloride 98 - 111 mmol/L 100 102 100  CO2 22 - 32 mmol/L 26 27 27   Calcium 8.9 - 10.3 mg/dL 9.2 9.0 9.3  Total Protein 6.5 - 8.1 g/dL - - 6.7  Total Bilirubin 0.3 - 1.2 mg/dL - - 0.7  Alkaline Phos 38 - 126 U/L - - 75  AST 15 - 41 U/L - - 27  ALT 0 - 44 U/L - - 22      Microbiology: Recent Results (from the past 240 hour(s))  Blood Culture (routine x 2)     Status: Abnormal (Preliminary result)   Collection Time: 08/11/18  5:26 PM  Result Value Ref Range Status   Specimen Description   Final    BLOOD LAC Performed at [facility], [address] [place], [place] [address]    Special Requests   Final    BOTTLES DRAWN AEROBIC AND ANAEROBIC Blood Culture results may not be optimal due to an excessive volume of blood received in culture bottles Performed at [facility], [address] [place], [place] [address]    Culture  Setup Time   Final    ANAEROBIC BOTTLE ONLY GRAM NEGATIVE RODS CRITICAL RESULT CALLED TO, READ BACK BY AND VERIFIED WITH: DAVID BASANTI AT 7035 ON 08/12/18 BY SNJ    Culture (A)  Final    ESCHERICHIA COLI  KLEBSIELLA PNEUMONIAE SUSCEPTIBILITIES TO FOLLOW Performed at [facility], [place] [address] [place], [place] [address]    Report Status PENDING  Incomplete  Blood Culture ID Panel (Reflexed)     Status: Abnormal   Collection Time: 08/11/18  5:26 PM  Result Value Ref Range Status   Enterococcus species NOT DETECTED NOT DETECTED Final   Listeria monocytogenes NOT DETECTED NOT DETECTED Final   Staphylococcus species NOT DETECTED NOT DETECTED Final    Staphylococcus aureus (BCID) NOT DETECTED NOT DETECTED Final   Streptococcus species NOT DETECTED NOT DETECTED Final   Streptococcus agalactiae NOT DETECTED NOT DETECTED Final   Streptococcus pneumoniae NOT DETECTED NOT DETECTED Final   Streptococcus pyogenes NOT DETECTED NOT DETECTED Final   Acinetobacter baumannii NOT DETECTED NOT DETECTED Final   Enterobacteriaceae species DETECTED (A) NOT DETECTED Final    Comment: CRITICAL RESULT CALLED TO, READ BACK BY AND VERIFIED WITH: DAVID BASANTI AT 0702 ON 08/12/18 BY SNJ    Enterobacter cloacae complex NOT DETECTED NOT DETECTED Final   Escherichia coli DETECTED (A) NOT DETECTED Final    Comment: CRITICAL RESULT CALLED TO, READ BACK BY AND VERIFIED WITH: DAVID BASANTI AT 0702 ON 08/12/18 BY SNJ    Klebsiella oxytoca NOT DETECTED NOT DETECTED Final   Klebsiella pneumoniae DETECTED (A) NOT DETECTED Final    Comment: CRITICAL RESULT CALLED TO, READ BACK BY AND VERIFIED WITH: DAVID BASANTI AT 0702 ON 08/12/18 BY SNJ    Proteus species NOT DETECTED NOT DETECTED Final   Serratia marcescens NOT DETECTED NOT DETECTED Final   Carbapenem resistance NOT DETECTED NOT DETECTED Final   Haemophilus influenzae NOT DETECTED NOT DETECTED Final   Neisseria meningitidis NOT DETECTED NOT DETECTED Final   Pseudomonas aeruginosa NOT DETECTED NOT DETECTED Final   Candida albicans NOT DETECTED NOT DETECTED Final   Candida glabrata NOT DETECTED NOT DETECTED Final   Candida krusei NOT DETECTED NOT DETECTED Final   Candida parapsilosis NOT DETECTED NOT DETECTED Final   Candida tropicalis NOT DETECTED NOT DETECTED Final    Comment: Performed at [facility], [address] [place], [place] [address]  Blood Culture (routine x 2)     Status: Abnormal (Preliminary result)   Collection Time: 08/11/18  5:31 PM  Result Value Ref Range Status   Specimen Description   Final    BLOOD RAC Performed at [facility], [address] [place], [place]  [address]    Special Requests   Final    BOTTLES DRAWN AEROBIC AND ANAEROBIC Blood Culture results may not be optimal due to an excessive volume of blood received in culture bottles Performed at [facility], [address] [place], [place] [address]    Culture  Setup Time   Final    ANAEROBIC BOTTLE ONLY GRAM NEGATIVE RODS CRITICAL VALUE NOTED.  VALUE IS CONSISTENT WITH PREVIOUSLY REPORTED AND CALLED VALUE. Performed at [facility], [address] [place], [place] [address]    Culture KLEBSIELLA PNEUMONIAE (A)  Final   Report Status PENDING  Incomplete  Urine Culture     Status: Abnormal   Collection Time: 08/11/18  6:58 PM  Result Value Ref Range Status   Specimen Description   Final    URINE, RANDOM Performed at [facility], [address] [place], [place] [address]    Special Requests   Final    NONE Performed at [facility], [address], [place], [place] [address]    Culture >=100,000  COLONIES/mL KLEBSIELLA PNEUMONIAE (A)  Final   Report Status 08/14/2018 FINAL  Final   Organism ID, Bacteria KLEBSIELLA PNEUMONIAE (A)  Final      Susceptibility   Klebsiella pneumoniae - MIC*    AMPICILLIN RESISTANT Resistant     CEFAZOLIN <=4 SENSITIVE Sensitive     CEFTRIAXONE <=1 SENSITIVE Sensitive     CIPROFLOXACIN <=0.25 SENSITIVE Sensitive     GENTAMICIN <=1 SENSITIVE Sensitive     IMIPENEM <=0.25 SENSITIVE Sensitive     NITROFURANTOIN <=16 SENSITIVE Sensitive     TRIMETH/SULFA <=20 SENSITIVE Sensitive     AMPICILLIN/SULBACTAM 4 SENSITIVE Sensitive     PIP/TAZO <=4 SENSITIVE Sensitive     Extended ESBL NEGATIVE Sensitive     * >=100,000 COLONIES/mL KLEBSIELLA PNEUMONIAE   IMAGING RESULTS: ?CXR nad Impression/Recommendation ?84 y.o. male with h/o BPH , intermittent self catheterization, LBBB, CKD is admitted with increasing frequency with chills, weakness and subjective fever. ? ?Gram negative bacteremia with e.coli and klebsiella  in a person with BPH and self catheterization. He only does it twice a day- very likley he has incomplete bladder emptying and urinary retention. check renal US to look for hydro He may need to catheterize more often On tamsulosin and finasteride Once final susceptibility of bacteria available we can decide on oral VS IV antibiotic  CKD  HTN management as per primary team  H/o Recurrent UTI secondary to BPH ___________________________________________________ Discussed with patient,

## 2018-08-14 NOTE — Progress Notes (Signed)
[facility] NAME: Lonnie [name]    MR#:  [id number]  DATE OF BIRTH:  January 21, 1927  SUBJECTIVE:  CHIEF COMPLAINT:   Chief Complaint  Patient presents with  . Weakness   Afebrile.  No abdominal pain.  REVIEW OF SYSTEMS:  Review of Systems  Constitutional: Negative for chills, fever and malaise/fatigue.  HENT: Negative for sore throat.   Eyes: Negative for blurred vision and double vision.  Respiratory: Negative for cough, hemoptysis, shortness of breath, wheezing and stridor.   Cardiovascular: Negative for chest pain, palpitations, orthopnea and leg swelling.  Gastrointestinal: Negative for abdominal pain, blood in stool, diarrhea, melena, nausea and vomiting.  Genitourinary: Negative for dysuria, flank pain and hematuria.  Musculoskeletal: Negative for back pain and joint pain.  Skin: Negative for rash.  Neurological: Negative for dizziness, sensory change, focal weakness, seizures, loss of consciousness, weakness and headaches.  Endo/Heme/Allergies: Negative for polydipsia.  Psychiatric/Behavioral: Negative for depression. The patient is not nervous/anxious.     DRUG ALLERGIES:   Allergies  Allergen Reactions  . Sulfa Antibiotics Other (See Comments)    Not sure of reaction. It was too long ago, but he said not to give it to him.   . Sulfasalazine     Other reaction(s): Other (See Comments) Not sure of reaction. It was too long ago, but he said not to give it to him.    VITALS:  Blood pressure (!) 124/53, pulse 76, temperature 97.6 F (36.4 C), temperature source Oral, resp. rate 15, height 6' (1.829 m), weight 81.6 kg, SpO2 96 %. PHYSICAL EXAMINATION:  Physical Exam  Constitutional: He is oriented to person, place, and time. No distress.  HENT:  Head: Normocephalic.  Mouth/Throat: Oropharynx is clear and moist.  Eyes: Pupils are equal, round, and reactive to light. Conjunctivae and EOM are normal. No scleral  icterus.  Neck: Normal range of motion. Neck supple. No JVD present. No tracheal deviation present.  Cardiovascular: Normal rate, regular rhythm and normal heart sounds. Exam reveals no gallop.  No murmur heard. Pulmonary/Chest: Effort normal and breath sounds normal. No respiratory distress. He has no wheezes. He has no rales.  Abdominal: Soft. Bowel sounds are normal. He exhibits no distension. There is no tenderness. There is no rebound.  Musculoskeletal: Normal range of motion. He exhibits no edema or tenderness.  Neurological: He is alert and oriented to person, place, and time. No cranial nerve deficit.  Skin: No rash noted. No erythema.  Psychiatric: He has a normal mood and affect.   LABORATORY PANEL:  Male CBC Recent Labs  Lab 08/13/18 0758  WBC 8.6  HGB 10.0*  HCT 30.1*  PLT 126*   ------------------------------------------------------------------------------------------------------------------ Chemistries  Recent Labs  Lab 08/11/18 1644  08/13/18 0758  NA 137   < > 135  K 3.9   < > 3.8  CL 100   < > 100  CO2 27   < > 26  GLUCOSE 113*   < > 110*  BUN 19   < > 20  CREATININE 1.30*   < > 1.38*  CALCIUM 9.3   < > 9.2  AST 27  --   --   ALT 22  --   --   ALKPHOS 75  --   --   BILITOT 0.7  --   --    < > = values in this interval not displayed.   RADIOLOGY:  No results found. ASSESSMENT AND PLAN:   *  E. coli and Klebsiella UTI and bacteremia. Catheter related Ceftriaxone changed to meropenem. Sensitivities pending.  ID consulted. Urine cultures with only Klebsiella.  *Chronic kidney disease stage III Appears at baseline, continue to monitor.  *Chronic systolic congestive heart failure, Ejection fraction 35 to 40%, monitor for fluid overload.  *Hypertension Lisinopril and verapamil.  *Hyperlipidemia Continue atorvastatin.  *COPD Continue Symbicort.  *BPH In and out bladder catheterization twice a day.  All the records are reviewed and  case discussed with Care Management/Social Worker. Management plans discussed with the patient, his daughter and they are in agreement.  CODE STATUS: Full Code  TOTAL TIME TAKING CARE OF THIS PATIENT: 35 minutes.   POSSIBLE D/C IN 1-2 DAYS, DEPENDING ON CLINICAL CONDITION.  Neita Carp M.D on 08/14/2018 at 12:52 PM  Between 7am to 6pm - Pager - [phone]  After 6pm go to [email or web address] - [facility]

## 2018-08-14 NOTE — Discharge Instructions (Addendum)
Resume diet and activity as before ° ° °

## 2018-08-15 ENCOUNTER — Inpatient Hospital Stay: Payer: No Typology Code available for payment source

## 2018-08-15 LAB — CULTURE, BLOOD (ROUTINE X 2)

## 2018-08-15 MED ORDER — CIPROFLOXACIN HCL 500 MG PO TABS: 500.0000 mg | ORAL_TABLET | Freq: Two times a day (BID) | ORAL | 0 refills | Status: AC

## 2018-08-15 NOTE — Progress Notes (Signed)
Discharge teaching given to patient, patient verbalized understanding and had no questions. Patient IV removed. Patient will be transported home by family. All patient belongings gathered prior to leaving.  

## 2018-08-21 ENCOUNTER — Telehealth: Payer: Self-pay

## 2018-08-21 NOTE — Telephone Encounter (Signed)
Flagged on EMMI report for having questions about discharge papers and unfilled prescriptions.  Called and spoke with patient, who mentioned he does not have any issues at this time and was able to get all his medications filled.  I thanked him for his time.

## 2018-08-29 NOTE — Discharge Summary (Signed)
[facility] NAME: Lonnie [name]    MR#:  [id number]  DATE OF BIRTH:  [birth year]  DATE OF ADMISSION:  08/11/2018 ADMITTING PHYSICIAN: Vaughan Basta, MD  DATE OF DISCHARGE: 08/15/2018  2:43 PM  PRIMARY CARE PHYSICIAN: Derinda Late, MD   ADMISSION DIAGNOSIS:  Weakness [R53.1] Urinary tract infection without hematuria, site unspecified [N39.0]  DISCHARGE DIAGNOSIS:  Principal Problem:   Acute lower UTI Active Problems:   UTI (urinary tract infection)   SECONDARY DIAGNOSIS:   Past Medical History:  Diagnosis Date  . Bundle branch block, left   . COPD (chronic obstructive pulmonary disease) ([place])   . Coronary artery disease   . Hypertension   . PSA elevation   . Renal disorder      ADMITTING HISTORY  HISTORY OF PRESENT ILLNESS: Lonnie [name]  is a 84 y.o. male with a known history of left bundle branch block, COPD, coronary artery disease, hypertension, benign prostatic hypertrophy, chronic kidney disease stage III, chronic systolic congestive heart failure-lives alone and walks with a cane, does in and out bladder catheterization twice daily to help with his retention.  He also urinates in between with a lot of residual urine for 3-4 times a day at his baseline. For last few days he has noticed his urinary frequency is increased to almost 10 times a day.  He was noted to have some chills and generalized weakness by his neighbor who is a retired [employer] scientist and suggested to go to emergency room. Noted to have an acute UTI in ER.  HOSPITAL COURSE:   *E. coli and Klebsiella UTI and bacteremia. Catheter related Ceftriaxone changed to meropenem after consulting with infectious disease. Renal ultrasound checked and did not show any hydronephrosis. Patient's WBC has normalized.  Afebrile.  No abdominal pain.  On the day of discharge she has been changed to oral antibiotics for 6 more days after discussing with Dr.  Steva Ready.  *Chronic kidney disease stage III Appears at baseline, Monitored  *Chronic systolic congestive heart failure, Ejection fraction 35 to 40%, monitor for fluid overload.  *Hypertension Lisinopril and verapamil.  *Hyperlipidemia Continue atorvastatin.  *COPD Continue Symbicort.  *BPH In and out bladder catheterization twice a day.  Patient discharged home with oral antibiotics in stable condition to follow-up with primary care physician.  CONSULTS OBTAINED:  Treatment Team:  Thayer Headings, MD  DRUG ALLERGIES:   Allergies  Allergen Reactions  . Sulfa Antibiotics Other (See Comments)    Not sure of reaction. It was too long ago, but he said not to give it to him.   . Sulfasalazine     Other reaction(s): Other (See Comments) Not sure of reaction. It was too long ago, but he said not to give it to him.     DISCHARGE MEDICATIONS:   Allergies as of 08/15/2018      Reactions   Sulfa Antibiotics Other (See Comments)   Not sure of reaction. It was too long ago, but he said not to give it to him.    Sulfasalazine    Other reaction(s): Other (See Comments) Not sure of reaction. It was too long ago, but he said not to give it to him.       Medication List    STOP taking these medications   doxycycline 100 MG capsule Commonly known as:  VIBRAMYCIN     TAKE these medications   alfuzosin 10 MG 24 hr tablet Commonly known as:  UROXATRAL  Take 10 mg by mouth daily with breakfast.   allopurinol 300 MG tablet Commonly known as:  ZYLOPRIM Take 300 mg by mouth daily.   ascorbic acid 1000 MG tablet Commonly known as:  VITAMIN C Take 1,000 mg by mouth 2 (two) times daily.   aspirin 81 MG tablet Take 81 mg by mouth daily.   atorvastatin 40 MG tablet Commonly known as:  LIPITOR Take 40 mg by mouth daily.   feeding supplement (ENSURE ENLIVE) Liqd Take 237 mLs by mouth 3 (three) times daily.   ferrous sulfate 325 (65 FE) MG tablet Take 325 mg by  mouth 2 (two) times daily with a meal.   finasteride 5 MG tablet Commonly known as:  PROSCAR Take 5 mg by mouth daily.   gabapentin 300 MG capsule Commonly known as:  NEURONTIN Take 600 mg by mouth 2 (two) times daily.   hydrochlorothiazide 25 MG tablet Commonly known as:  HYDRODIURIL Take 25 mg by mouth daily.   Ipratropium-Albuterol 20-100 MCG/ACT Aers respimat Commonly known as:  COMBIVENT Inhale 1 puff into the lungs daily.   ipratropium-albuterol 0.5-2.5 (3) MG/3ML Soln Commonly known as:  DUONEB Take 3 mLs by nebulization 4 (four) times daily as needed (wheezing).   lisinopril 40 MG tablet Commonly known as:  PRINIVIL,ZESTRIL Take 40 mg by mouth daily.   magnesium oxide 400 MG tablet Commonly known as:  MAG-OX Take 400 mg by mouth daily.   Melatonin 5 MG Tabs Take 5 mg by mouth at bedtime.   MULTI-VITAMINS Tabs Take 1 tablet by mouth daily.   omeprazole 40 MG capsule Commonly known as:  PRILOSEC Take 40 mg by mouth 2 (two) times daily.   SYMBICORT 160-4.5 MCG/ACT inhaler Generic drug:  budesonide-formoterol Inhale 2 puffs into the lungs 2 (two) times daily.   traMADol 50 MG tablet Commonly known as:  ULTRAM Take 50 mg by mouth every 6 (six) hours as needed for moderate pain.   triamcinolone cream 0.1 % Commonly known as:  KENALOG Apply 1 application topically 2 (two) times daily.   verapamil 120 MG CR tablet Commonly known as:  CALAN-SR Take 120 mg by mouth daily.     ASK your doctor about these medications   ciprofloxacin 500 MG tablet Commonly known as:  CIPRO Take 1 tablet (500 mg total) by mouth 2 (two) times daily for 6 days. Ask about: Should I take this medication?       Today   VITAL SIGNS:  Blood pressure (!) 142/59, pulse 70, temperature 97.7 F (36.5 C), temperature source Oral, resp. rate 18, height 6' (1.829 m), weight 81.6 kg, SpO2 93 %.  I/O:  No intake or output data in the 24 hours ending 08/29/18 1728  PHYSICAL  EXAMINATION:  Physical Exam  GENERAL:  84 y.o.-year-old patient lying in the bed with no acute distress.  LUNGS: Normal breath sounds bilaterally, no wheezing, rales,rhonchi or crepitation. No use of accessory muscles of respiration.  CARDIOVASCULAR: S1, S2 normal. No murmurs, rubs, or gallops.  ABDOMEN: Soft, non-tender, non-distended. Bowel sounds present. No organomegaly or mass.  NEUROLOGIC: Moves all 4 extremities. PSYCHIATRIC: The patient is alert and oriented x 3.  SKIN: No obvious rash, lesion, or ulcer.   DATA REVIEW:   CBC No results for input(s): WBC, HGB, HCT, PLT in the last 168 hours.  Chemistries  No results for input(s): NA, K, CL, CO2, GLUCOSE, BUN, CREATININE, CALCIUM, MG, AST, ALT, ALKPHOS, BILITOT in the last 168 hours.  Invalid input(s):  GFRCGP  Cardiac Enzymes No results for input(s): TROPONINI in the last 168 hours.  Microbiology Results  Results for orders placed or performed during the hospital encounter of 08/11/18  Blood Culture (routine x 2)     Status: Abnormal   Collection Time: 08/11/18  5:26 PM  Result Value Ref Range Status   Specimen Description   Final    BLOOD LAC Performed at [facility], [address] [place], [place] [address]    Special Requests   Final    BOTTLES DRAWN AEROBIC AND ANAEROBIC Blood Culture results may not be optimal due to an excessive volume of blood received in culture bottles Performed at [facility], [address] [place], [place] [address]    Culture  Setup Time   Final    ANAEROBIC BOTTLE ONLY GRAM NEGATIVE RODS CRITICAL RESULT CALLED TO, READ BACK BY AND VERIFIED WITH: DAVID BASANTI AT 0702 ON 08/12/18 BY SNJ Performed at [facility], [place] [address], [place], [place] [address]    Culture ESCHERICHIA COLI KLEBSIELLA PNEUMONIAE  (A)  Final   Report Status 08/15/2018 FINAL  Final   Organism ID, Bacteria ESCHERICHIA COLI  Final   Organism ID, Bacteria KLEBSIELLA PNEUMONIAE   Final      Susceptibility   Escherichia coli - MIC*    AMPICILLIN 8 SENSITIVE Sensitive     CEFAZOLIN <=4 SENSITIVE Sensitive     CEFEPIME <=1 SENSITIVE Sensitive     CEFTAZIDIME <=1 SENSITIVE Sensitive     CEFTRIAXONE <=1 SENSITIVE Sensitive     CIPROFLOXACIN <=0.25 SENSITIVE Sensitive     GENTAMICIN <=1 SENSITIVE Sensitive     IMIPENEM <=0.25 SENSITIVE Sensitive     TRIMETH/SULFA <=20 SENSITIVE Sensitive     AMPICILLIN/SULBACTAM 4 SENSITIVE Sensitive     PIP/TAZO <=4 SENSITIVE Sensitive     Extended ESBL NEGATIVE Sensitive     * ESCHERICHIA COLI   Klebsiella pneumoniae - MIC*    AMPICILLIN >=32 RESISTANT Resistant     CEFAZOLIN <=4 SENSITIVE Sensitive     CEFEPIME <=1 SENSITIVE Sensitive     CEFTAZIDIME <=1 SENSITIVE Sensitive     CEFTRIAXONE <=1 SENSITIVE Sensitive     CIPROFLOXACIN <=0.25 SENSITIVE Sensitive     GENTAMICIN <=1 SENSITIVE Sensitive     IMIPENEM <=0.25 SENSITIVE Sensitive     TRIMETH/SULFA <=20 SENSITIVE Sensitive     AMPICILLIN/SULBACTAM 4 SENSITIVE Sensitive     PIP/TAZO <=4 SENSITIVE Sensitive     Extended ESBL NEGATIVE Sensitive     * KLEBSIELLA PNEUMONIAE  Blood Culture ID Panel (Reflexed)     Status: Abnormal   Collection Time: 08/11/18  5:26 PM  Result Value Ref Range Status   Enterococcus species NOT DETECTED NOT DETECTED Final   Listeria monocytogenes NOT DETECTED NOT DETECTED Final   Staphylococcus species NOT DETECTED NOT DETECTED Final   Staphylococcus aureus (BCID) NOT DETECTED NOT DETECTED Final   Streptococcus species NOT DETECTED NOT DETECTED Final   Streptococcus agalactiae NOT DETECTED NOT DETECTED Final   Streptococcus pneumoniae NOT DETECTED NOT DETECTED Final   Streptococcus pyogenes NOT DETECTED NOT DETECTED Final   Acinetobacter baumannii NOT DETECTED NOT DETECTED Final   Enterobacteriaceae species DETECTED (A) NOT DETECTED Final    Comment: CRITICAL RESULT CALLED TO, READ BACK BY AND VERIFIED WITH: DAVID BASANTI AT 0702 ON 08/12/18  BY SNJ    Enterobacter cloacae complex NOT DETECTED NOT DETECTED Final   Escherichia coli DETECTED (A) NOT DETECTED Final  Comment: CRITICAL RESULT CALLED TO, READ BACK BY AND VERIFIED WITH: DAVID BASANTI AT 0702 ON 08/12/18 BY SNJ    Klebsiella oxytoca NOT DETECTED NOT DETECTED Final   Klebsiella pneumoniae DETECTED (A) NOT DETECTED Final    Comment: CRITICAL RESULT CALLED TO, READ BACK BY AND VERIFIED WITH: DAVID BASANTI AT 0702 ON 08/12/18 BY SNJ    Proteus species NOT DETECTED NOT DETECTED Final   Serratia marcescens NOT DETECTED NOT DETECTED Final   Carbapenem resistance NOT DETECTED NOT DETECTED Final   Haemophilus influenzae NOT DETECTED NOT DETECTED Final   Neisseria meningitidis NOT DETECTED NOT DETECTED Final   Pseudomonas aeruginosa NOT DETECTED NOT DETECTED Final   Candida albicans NOT DETECTED NOT DETECTED Final   Candida glabrata NOT DETECTED NOT DETECTED Final   Candida krusei NOT DETECTED NOT DETECTED Final   Candida parapsilosis NOT DETECTED NOT DETECTED Final   Candida tropicalis NOT DETECTED NOT DETECTED Final    Comment: Performed at [facility], [address], [place], [place] [address]  Blood Culture (routine x 2)     Status: Abnormal   Collection Time: 08/11/18  5:31 PM  Result Value Ref Range Status   Specimen Description   Final    BLOOD RAC Performed at [facility], [address] [place], [place] [address]    Special Requests   Final    BOTTLES DRAWN AEROBIC AND ANAEROBIC Blood Culture results may not be optimal due to an excessive volume of blood received in culture bottles Performed at [facility], [address] [place], [place] [address]    Culture  Setup Time   Final    ANAEROBIC BOTTLE ONLY GRAM NEGATIVE RODS CRITICAL VALUE NOTED.  VALUE IS CONSISTENT WITH PREVIOUSLY REPORTED AND CALLED VALUE. Performed at [facility], [address] [place], [place] [address]    Culture (A)  Final     KLEBSIELLA PNEUMONIAE SUSCEPTIBILITIES PERFORMED ON PREVIOUS CULTURE WITHIN THE LAST 5 DAYS. Performed at [facility], [place] [address], [place], [place] [address]    Report Status 08/15/2018 FINAL  Final  Urine Culture     Status: Abnormal   Collection Time: 08/11/18  6:58 PM  Result Value Ref Range Status   Specimen Description   Final    URINE, RANDOM Performed at [facility], [address] [place], [place] [address]    Special Requests   Final    NONE Performed at [facility], [address] [place], [place] [address]    Culture >=100,000 COLONIES/mL KLEBSIELLA PNEUMONIAE (A)  Final   Report Status 08/14/2018 FINAL  Final   Organism ID, Bacteria KLEBSIELLA PNEUMONIAE (A)  Final      Susceptibility   Klebsiella pneumoniae - MIC*    AMPICILLIN RESISTANT Resistant     CEFAZOLIN <=4 SENSITIVE Sensitive     CEFTRIAXONE <=1 SENSITIVE Sensitive     CIPROFLOXACIN <=0.25 SENSITIVE Sensitive     GENTAMICIN <=1 SENSITIVE Sensitive     IMIPENEM <=0.25 SENSITIVE Sensitive     NITROFURANTOIN <=16 SENSITIVE Sensitive     TRIMETH/SULFA <=20 SENSITIVE Sensitive     AMPICILLIN/SULBACTAM 4 SENSITIVE Sensitive     PIP/TAZO <=4 SENSITIVE Sensitive     Extended ESBL NEGATIVE Sensitive     * >=100,000 COLONIES/mL KLEBSIELLA PNEUMONIAE    RADIOLOGY:  No results found.  Follow up with PCP in 1 week.  Management plans discussed with the patient, family and they are in agreement.  CODE STATUS:  Code Status History    Date Active Date Inactive Code Status Order ID Comments User Context   08/11/2018 2104 08/15/2018 1748 Full Code [id number]  Vaughan Basta, MD Inpatient   01/10/2016 2016 01/18/2016 1943 Full Code [id number]  Henreitta Leber, MD Inpatient    Advance Directive Documentation     Most Recent Value  Type of Advance Directive  Living will  Pre-existing out of facility DNR order (yellow form or pink MOST form)  -  "MOST" Form in Place?  -       TOTAL TIME TAKING CARE OF THIS PATIENT ON DAY OF DISCHARGE: more than 30 minutes.   Neita Carp M.D on 08/29/2018 at 5:28 PM  Between 7am to 6pm - Pager - [phone]  After 6pm go to [email or web address] - password [email or web address] [facility]  Office  [phone]  CC: Primary care physician; Derinda Late, MD  Note: This dictation was prepared with Dragon dictation along with smaller phrase technology. Any transcriptional errors that result from this process are unintentional.

## 2018-11-29 ENCOUNTER — Ambulatory Visit (INDEPENDENT_AMBULATORY_CARE_PROVIDER_SITE_OTHER): Payer: Medicare Other | Admitting: Urology

## 2018-11-29 ENCOUNTER — Encounter: Payer: Self-pay | Admitting: Urology

## 2018-11-29 VITALS — BP 105/58 | HR 92 | Ht 72.0 in | Wt 181.9 lb

## 2018-11-29 DIAGNOSIS — N401 Enlarged prostate with lower urinary tract symptoms: Secondary | ICD-10-CM | POA: Diagnosis not present

## 2018-11-29 DIAGNOSIS — N312 Flaccid neuropathic bladder, not elsewhere classified: Secondary | ICD-10-CM | POA: Diagnosis not present

## 2018-11-29 DIAGNOSIS — R339 Retention of urine, unspecified: Secondary | ICD-10-CM

## 2018-11-29 DIAGNOSIS — N138 Other obstructive and reflux uropathy: Secondary | ICD-10-CM

## 2018-11-29 NOTE — Progress Notes (Signed)
11/29/2018 1:37 PM   Lonnie [name]. Jul 16, 1927 [id number]  Referring provider: Derinda Late, MD [phone] [facility] [place], [place] [address]  Chief Complaint  Patient presents with  . Benign Prostatic Hypertrophy    HPI: 85 year old male with bladder outlet obstruction and bladder hypotonicity.  He was on intermittent catheterization however has had recurrent infections since starting.  He was admitted for a UTI on 08/11/2018 and discharged on 08/15/2018.  He had E. coli/Klebsiella UTI and bacteremia.  After that hospitalization he elected to discontinue intermittent catheterization and states he has had no problems.  He has stable lower urinary tract symptoms which he says are not bothersome.  He remains on alfuzosin and finasteride.   PMH: Past Medical History:  Diagnosis Date  . Bundle branch block, left   . COPD (chronic obstructive pulmonary disease) ([place])   . Coronary artery disease   . Hypertension   . PSA elevation   . Renal disorder     Surgical History: Past Surgical History:  Procedure Laterality Date  . APPENDECTOMY    . RECTAL SURGERY     x3    Home Medications:  Allergies as of 11/29/2018      Reactions   Sulfa Antibiotics Other (See Comments)   Not sure of reaction. It was too long ago, but he said not to give it to him.    Sulfasalazine    Other reaction(s): Other (See Comments) Not sure of reaction. It was too long ago, but he said not to give it to him.       Medication List       Accurate as of November 29, 2018  1:37 PM. Always use your most recent med list.        alfuzosin 10 MG 24 hr tablet Commonly known as:  UROXATRAL Take 10 mg by mouth daily with breakfast.   allopurinol 300 MG tablet Commonly known as:  ZYLOPRIM Take 300 mg by mouth daily.   ascorbic acid 1000 MG tablet Commonly known as:  VITAMIN C Take 1,000 mg by mouth 2 (two) times daily.   aspirin 81 MG tablet Take  81 mg by mouth daily.   atorvastatin 40 MG tablet Commonly known as:  LIPITOR Take 40 mg by mouth daily.   feeding supplement (ENSURE ENLIVE) Liqd Take 237 mLs by mouth 3 (three) times daily.   ferrous sulfate 325 (65 FE) MG tablet Take 325 mg by mouth 2 (two) times daily with a meal.   finasteride 5 MG tablet Commonly known as:  PROSCAR Take 5 mg by mouth daily.   gabapentin 300 MG capsule Commonly known as:  NEURONTIN Take 600 mg by mouth 2 (two) times daily.   hydrochlorothiazide 25 MG tablet Commonly known as:  HYDRODIURIL Take 25 mg by mouth daily.   Ipratropium-Albuterol 20-100 MCG/ACT Aers respimat Commonly known as:  COMBIVENT Inhale 1 puff into the lungs daily.   ipratropium-albuterol 0.5-2.5 (3) MG/3ML Soln Commonly known as:  DUONEB Take 3 mLs by nebulization 4 (four) times daily as needed (wheezing).   lisinopril 40 MG tablet Commonly known as:  PRINIVIL,ZESTRIL Take 40 mg by mouth daily.   magnesium oxide 400 MG tablet Commonly known as:  MAG-OX Take 400 mg by mouth daily.   Melatonin 5 MG Tabs Take 5 mg by mouth at bedtime.   omeprazole 40 MG capsule Commonly known as:  PRILOSEC Take 40 mg by mouth 2 (two) times  daily.   SYMBICORT 160-4.5 MCG/ACT inhaler Generic drug:  budesonide-formoterol Inhale 2 puffs into the lungs 2 (two) times daily.   traMADol 50 MG tablet Commonly known as:  ULTRAM Take 50 mg by mouth every 6 (six) hours as needed for moderate pain.   triamcinolone cream 0.1 % Commonly known as:  KENALOG Apply 1 application topically 2 (two) times daily.   verapamil 120 MG CR tablet Commonly known as:  CALAN-SR Take 120 mg by mouth daily.       Allergies:  Allergies  Allergen Reactions  . Sulfa Antibiotics Other (See Comments)    Not sure of reaction. It was too long ago, but he said not to give it to him.   . Sulfasalazine     Other reaction(s): Other (See Comments) Not sure of reaction. It was too long ago, but he said  not to give it to him.     Family History: Family History  Problem Relation Age of Onset  . Heart attack Mother   . Heart attack Father     Social History:  reports that he has quit smoking. His smoking use included cigarettes. He has a 100.00 pack-year smoking history. He has never used smokeless tobacco. He reports current alcohol use. He reports that he does not use drugs.  ROS: UROLOGY Frequent Urination?: Yes Hard to postpone urination?: Yes Burning/pain with urination?: No Get up at night to urinate?: Yes Leakage of urine?: Yes Urine stream starts and stops?: Yes Trouble starting stream?: Yes Do you have to strain to urinate?: Yes Blood in urine?: No Urinary tract infection?: Yes Sexually transmitted disease?: No Injury to kidneys or bladder?: No Painful intercourse?: No Weak stream?: No Erection problems?: Yes Penile pain?: No  Gastrointestinal Nausea?: No Vomiting?: No Indigestion/heartburn?: Yes Diarrhea?: No Constipation?: No  Constitutional Fever: No Night sweats?: No Weight loss?: No Fatigue?: No  Skin Skin rash/lesions?: Yes Itching?: No  Eyes Blurred vision?: No Double vision?: No  Ears/Nose/Throat Sore throat?: No Sinus problems?: No  Hematologic/Lymphatic Swollen glands?: No Easy bruising?: No  Cardiovascular Leg swelling?: No Chest pain?: No  Respiratory Cough?: No Shortness of breath?: No  Endocrine Excessive thirst?: No  Musculoskeletal Back pain?: No Joint pain?: No  Neurological Headaches?: No Dizziness?: No  Psychologic Depression?: No Anxiety?: No  Physical Exam: BP (!) 105/58 (BP Location: Left Arm, Patient Position: Sitting, Cuff Size: Normal)   Pulse 92   Ht 6' (1.829 m)   Wt 181 lb 14.4 oz (82.5 kg)   BMI 24.67 kg/m   Constitutional:  Alert and oriented, No acute distress. HEENT: [place] AT, moist mucus membranes.  Trachea midline, no masses. Cardiovascular: No clubbing, cyanosis, or edema. Respiratory:  Normal respiratory effort, no increased work of breathing.   Assessment & Plan:   85 year old male with BPH and incomplete bladder emptying.  Previous UT study showed outlet obstruction and bladder hypotonicity.  He declined outlet surgery.  He is also discontinued intermittent catheterization.  I discussed the need for monitoring of his renal function.  His creatinine on 08/13/2018 was 1.38.  We will have him follow-up in 6 months for an office visit and serum creatinine.  He was instructed to call earlier for any worsening voiding problems.   Abbie Sons, [place] [address] [place], [place] [address] [phone]

## 2019-01-25 ENCOUNTER — Other Ambulatory Visit: Payer: Self-pay | Admitting: Specialist

## 2019-01-25 ENCOUNTER — Other Ambulatory Visit (HOSPITAL_COMMUNITY): Payer: Self-pay | Admitting: Specialist

## 2019-01-25 DIAGNOSIS — R911 Solitary pulmonary nodule: Secondary | ICD-10-CM

## 2019-03-06 ENCOUNTER — Other Ambulatory Visit: Payer: Self-pay

## 2019-03-06 ENCOUNTER — Ambulatory Visit
Admission: RE | Admit: 2019-03-06 | Discharge: 2019-03-06 | Disposition: A | Discharge status: Home / Self Care | Payer: Medicare Other | Source: Ambulatory Visit | Attending: Specialist | Admitting: Specialist

## 2019-03-06 DIAGNOSIS — R911 Solitary pulmonary nodule: Secondary | ICD-10-CM | POA: Insufficient documentation

## 2019-05-30 ENCOUNTER — Encounter: Payer: Self-pay | Admitting: Urology

## 2019-05-30 ENCOUNTER — Ambulatory Visit (INDEPENDENT_AMBULATORY_CARE_PROVIDER_SITE_OTHER): Payer: Medicare Other | Admitting: Urology

## 2019-05-30 ENCOUNTER — Other Ambulatory Visit: Payer: Self-pay

## 2019-05-30 VITALS — BP 139/67 | HR 87 | Ht 69.0 in | Wt 185.0 lb

## 2019-05-30 DIAGNOSIS — N183 Chronic kidney disease, stage 3 unspecified: Secondary | ICD-10-CM

## 2019-05-30 DIAGNOSIS — R339 Retention of urine, unspecified: Secondary | ICD-10-CM | POA: Diagnosis not present

## 2019-05-30 DIAGNOSIS — N401 Enlarged prostate with lower urinary tract symptoms: Secondary | ICD-10-CM

## 2019-05-30 DIAGNOSIS — N138 Other obstructive and reflux uropathy: Secondary | ICD-10-CM | POA: Diagnosis not present

## 2019-05-30 NOTE — Progress Notes (Signed)
05/30/2019 2:38 PM   Lonnie [name]. 07-07-1927 [id number]  Referring provider: Derinda Late, MD [phone] [facility] [place],  [place] [address]  Chief Complaint  Patient presents with  . Benign Prostatic Hypertrophy    HPI: 85 y.o. male presents for six-month follow-up bladder outlet obstruction with bladder hypotonicity.  He had previously been on intermittent catheterization however decided to stop after getting recurrent urinary tract infections.  He is on alfuzosin and finasteride.  He denies bothersome lower urinary tract symptoms.  He declined outlet surgery.  Denies dysuria or gross hematuria.   PMH: Past Medical History:  Diagnosis Date  . Bundle branch block, left   . COPD (chronic obstructive pulmonary disease) ([place])   . Coronary artery disease   . Hypertension   . PSA elevation   . Renal disorder     Surgical History: Past Surgical History:  Procedure Laterality Date  . APPENDECTOMY    . RECTAL SURGERY     x3    Home Medications:  Allergies as of 05/30/2019      Reactions   Sulfa Antibiotics Other (See Comments)   Not sure of reaction. It was too long ago, but he said not to give it to him.    Sulfasalazine    Other reaction(s): Other (See Comments) Not sure of reaction. It was too long ago, but he said not to give it to him.    Penicillin G Rash      Medication List       Accurate as of May 30, 2019  2:38 PM. If you have any questions, ask your nurse or doctor.        alfuzosin 10 MG 24 hr tablet Commonly known as: UROXATRAL Take 10 mg by mouth daily with breakfast.   allopurinol 300 MG tablet Commonly known as: ZYLOPRIM Take 300 mg by mouth daily.   aspirin 81 MG tablet Take 81 mg by mouth daily.   atorvastatin 40 MG tablet Commonly known as: LIPITOR Take 40 mg by mouth daily.   bisacodyl 5 MG EC tablet Commonly known as: DULCOLAX Take by mouth.   docusate sodium 50 MG  capsule Commonly known as: COLACE Take by mouth.   feeding supplement (ENSURE ENLIVE) Liqd Take 237 mLs by mouth 3 (three) times daily.   ferrous sulfate 325 (65 FE) MG tablet Take 325 mg by mouth 2 (two) times daily with a meal.   finasteride 5 MG tablet Commonly known as: PROSCAR Take 5 mg by mouth daily.   gabapentin 300 MG capsule Commonly known as: NEURONTIN Take 600 mg by mouth 2 (two) times daily.   hydrochlorothiazide 25 MG tablet Commonly known as: HYDRODIURIL Take 25 mg by mouth daily.   Ipratropium-Albuterol 20-100 MCG/ACT Aers respimat Commonly known as: COMBIVENT Inhale 1 puff into the lungs daily.   ipratropium-albuterol 0.5-2.5 (3) MG/3ML Soln Commonly known as: DUONEB Take 3 mLs by nebulization 4 (four) times daily as needed (wheezing).   lisinopril 40 MG tablet Commonly known as: ZESTRIL Take 40 mg by mouth daily.   magnesium oxide 400 MG tablet Commonly known as: MAG-OX Take 400 mg by mouth daily.   Melatonin 5 MG Tabs Take 5 mg by mouth at bedtime.   mometasone 0.1 % lotion Commonly known as: ELOCON APPLY 1 ML TOPICALLY TO AFFECTED AREAS ONCE DAILY FOR PSORIASIS OF THE SCALP UP TO 5 TO 7 DAYS   mupirocin ointment 2 % Commonly known  as: BACTROBAN APPLY 1 APPLICATION TOPICALLY TO ULCER ON BUTTOCKS TWICE A DAY   Omega-3 1000 MG Caps Take by mouth.   omeprazole 40 MG capsule Commonly known as: PRILOSEC Take 40 mg by mouth 2 (two) times daily.   OPTIVE 0.5-0.9 % ophthalmic solution Generic drug: carboxymethylcellul-glycerin Apply to eye.   Symbicort 160-4.5 MCG/ACT inhaler Generic drug: budesonide-formoterol Inhale 2 puffs into the lungs 2 (two) times daily.   traMADol 50 MG tablet Commonly known as: ULTRAM Take 50 mg by mouth every 6 (six) hours as needed for moderate pain.   triamcinolone cream 0.1 % Commonly known as: KENALOG Apply 1 application topically 2 (two) times daily.   Ultra Fresh PM Oint once daily   verapamil 120  MG CR tablet Commonly known as: CALAN-SR Take 120 mg by mouth daily.   vitamin C 250 MG tablet Commonly known as: ASCORBIC ACID Take by mouth. What changed: Another medication with the same name was removed. Continue taking this medication, and follow the directions you see here. Changed by: Abbie Sons, MD       Allergies:  Allergies  Allergen Reactions  . Sulfa Antibiotics Other (See Comments)    Not sure of reaction. It was too long ago, but he said not to give it to him.   . Sulfasalazine     Other reaction(s): Other (See Comments) Not sure of reaction. It was too long ago, but he said not to give it to him.   Marland Kitchen Penicillin G Rash    Family History: Family History  Problem Relation Age of Onset  . Heart attack Mother   . Heart attack Father     Social History:  reports that he has quit smoking. His smoking use included cigarettes. He has a 100.00 pack-year smoking history. He has never used smokeless tobacco. He reports current alcohol use. He reports that he does not use drugs.  ROS: UROLOGY Frequent Urination?: Yes Hard to postpone urination?: Yes Burning/pain with urination?: No Get up at night to urinate?: Yes Leakage of urine?: No Urine stream starts and stops?: No Trouble starting stream?: No Do you have to strain to urinate?: No Blood in urine?: No Urinary tract infection?: No Sexually transmitted disease?: No Injury to kidneys or bladder?: No Painful intercourse?: No Weak stream?: No Erection problems?: No Penile pain?: No  Gastrointestinal Nausea?: No Vomiting?: No Indigestion/heartburn?: No Diarrhea?: No Constipation?: Yes  Constitutional Fever: No Night sweats?: No Weight loss?: No Fatigue?: No  Skin Skin rash/lesions?: No Itching?: No  Eyes Blurred vision?: No Double vision?: No  Ears/Nose/Throat Sore throat?: No Sinus problems?: No  Hematologic/Lymphatic Swollen glands?: No Easy bruising?: No  Cardiovascular Leg  swelling?: No Chest pain?: No  Respiratory Cough?: No Shortness of breath?: No  Endocrine Excessive thirst?: No  Musculoskeletal Back pain?: No Joint pain?: No  Neurological Headaches?: No Dizziness?: No  Psychologic Depression?: No Anxiety?: No  Physical Exam: BP 139/67 (BP Location: Left Arm, Patient Position: Sitting, Cuff Size: Normal)   Pulse 87   Ht 5\' 9"  (1.753 m)   Wt 185 lb (83.9 kg)   BMI 27.32 kg/m   Constitutional:  Alert, No acute distress. HEENT: [place] AT, moist mucus membranes.  Trachea midline, no masses. Cardiovascular: No clubbing, cyanosis, or edema. Respiratory: Normal respiratory effort, no increased work of breathing. GI: Abdomen is soft, nontender, nondistended, no abdominal masses Skin: No rashes, bruises or suspicious lesions. Neurologic: Grossly intact, no focal deficits, moving all 4 extremities. Psychiatric: Normal mood  and affect.   Assessment & Plan:    - Bladder outlet obstruction/bladder hypotonicity PVR by bladder scan was 233 mL.  A serum creatinine was drawn.  If stable he will follow-up in 6 months.  Continue alfuzosin/finasteride.   Abbie Sons, [facility] [address], [place], [place] [address] [phone]

## 2019-05-31 LAB — CREATININE, SERUM
Creatinine, Ser: 1.53 mg/dL — ABNORMAL HIGH (ref 0.76–1.27)
GFR calc Af Amer: 46 mL/min/{1.73_m2} — ABNORMAL LOW (ref >59)
GFR calc non Af Amer: 39 mL/min/{1.73_m2} — ABNORMAL LOW (ref >59)

## 2019-06-04 ENCOUNTER — Telehealth: Payer: Self-pay | Admitting: *Deleted

## 2019-06-04 NOTE — Telephone Encounter (Signed)
Notified patient as instructed, patient pleased. Discussed follow-up appointments, patient agrees  

## 2019-06-04 NOTE — Telephone Encounter (Signed)
-----   Message from Abbie Sons, MD sent at 06/04/2019  7:33 AM EDT ----- Creatinine increased slightly 1.53 however last month it was stable at 1.3.  We will continue to monitor.

## 2019-12-05 ENCOUNTER — Ambulatory Visit (INDEPENDENT_AMBULATORY_CARE_PROVIDER_SITE_OTHER): Payer: Medicare Other | Admitting: Urology

## 2019-12-05 ENCOUNTER — Encounter: Payer: Self-pay | Admitting: Urology

## 2019-12-05 ENCOUNTER — Other Ambulatory Visit: Payer: Self-pay

## 2019-12-05 VITALS — BP 157/77 | HR 76 | Ht 71.0 in | Wt 185.0 lb

## 2019-12-05 DIAGNOSIS — N138 Other obstructive and reflux uropathy: Secondary | ICD-10-CM

## 2019-12-05 DIAGNOSIS — N401 Enlarged prostate with lower urinary tract symptoms: Secondary | ICD-10-CM

## 2019-12-05 LAB — BLADDER SCAN AMB NON-IMAGING: Scan Result: 331

## 2019-12-05 NOTE — Progress Notes (Signed)
12/05/2019 3:13 PM   Lonnie [name]. 05/23/1927 [id number]  Referring provider: Derinda Late, MD [phone] [address] [facility] [place],  [place] [address]  Chief Complaint  Patient presents with  . Benign Prostatic Hypertrophy    HPI: 86 yo male with bladder outlet obstruction and elevated residual approximately 300 mL.  He declined outlet procedures and was on intermittent catheterization however discontinued secondary to development of symptomatic UTIs.  He does have worsening voiding symptoms and thinks he would like to schedule an outlet procedure.  He did have a urodynamic study which did show obstruction and a hyposensitive bladder.  He does have significant medical comorbidities  PVR today was 331 mL.  He remains on alfuzosin and finasteride   PMH: Past Medical History:  Diagnosis Date  . Basal cell carcinoma 11/13/2009   Right alar groove  . Basal cell carcinoma 04/28/2010   left preauricular, BCC with focal sclerosis. Exc. 06/04/2010  . Basal cell carcinoma 04/24/2013   right post auricular  . Basal cell carcinoma 10/26/2017   right inferior cheek above mandible, Superficial  . Basal cell carcinoma 03/22/2018   right post. base of neck  . Basal cell carcinoma 05/08/2018   post. neck, spinal, Superficial. EDC 05/08/2018  . Basal cell carcinoma 07/23/2018   left mid supraclavicular, Superficial and nodular pattern. EDC  . Basal cell carcinoma 07/23/2018   right superior chest parasternal. Superficial and nodular pattern. EDC  . Basal cell carcinoma 05/16/2019   left lateral neck. Superficial and nodular pattern. EDC  . Basal cell carcinoma 11/07/2019   left lateral forehead. Nodular. EDC  . Bundle branch block, left   . COPD (chronic obstructive pulmonary disease) ([place])   . Coronary artery disease   . Hypertension   . PSA elevation   . Renal disorder     Surgical History: Past Surgical History:  Procedure Laterality  Date  . APPENDECTOMY    . RECTAL SURGERY     x3    Home Medications:  Allergies as of 12/05/2019      Reactions   Sulfa Antibiotics Other (See Comments)   Not sure of reaction. It was too long ago, but he said not to give it to him.    Sulfasalazine    Other reaction(s): Other (See Comments) Not sure of reaction. It was too long ago, but he said not to give it to him.    Penicillin G Rash      Medication List       Accurate as of December 05, 2019  3:13 PM. If you have any questions, ask your nurse or doctor.        alfuzosin 10 MG 24 hr tablet Commonly known as: UROXATRAL Take 10 mg by mouth daily with breakfast.   allopurinol 300 MG tablet Commonly known as: ZYLOPRIM Take 300 mg by mouth daily.   aspirin 81 MG tablet Take 81 mg by mouth daily.   atorvastatin 40 MG tablet Commonly known as: LIPITOR Take 40 mg by mouth daily.   bisacodyl 5 MG EC tablet Commonly known as: DULCOLAX Take by mouth.   docusate sodium 50 MG capsule Commonly known as: COLACE Take by mouth.   feeding supplement (ENSURE ENLIVE) Liqd Take 237 mLs by mouth 3 (three) times daily.   ferrous sulfate 325 (65 FE) MG tablet Take 325 mg by mouth 2 (two) times daily with a meal.   finasteride 5 MG tablet Commonly known as:  PROSCAR Take 5 mg by mouth daily.   gabapentin 300 MG capsule Commonly known as: NEURONTIN Take 600 mg by mouth 2 (two) times daily.   hydrochlorothiazide 25 MG tablet Commonly known as: HYDRODIURIL Take 25 mg by mouth daily.   Ipratropium-Albuterol 20-100 MCG/ACT Aers respimat Commonly known as: COMBIVENT Inhale 1 puff into the lungs daily.   ipratropium-albuterol 0.5-2.5 (3) MG/3ML Soln Commonly known as: DUONEB Take 3 mLs by nebulization 4 (four) times daily as needed (wheezing).   lisinopril 40 MG tablet Commonly known as: ZESTRIL Take 40 mg by mouth daily.   magnesium oxide 400 MG tablet Commonly known as: MAG-OX Take 400 mg by mouth daily.   Melatonin 5  MG Tabs Take 5 mg by mouth at bedtime.   mometasone 0.1 % lotion Commonly known as: ELOCON APPLY 1 ML TOPICALLY TO AFFECTED AREAS ONCE DAILY FOR PSORIASIS OF THE SCALP UP TO 5 TO 7 DAYS   mupirocin ointment 2 % Commonly known as: BACTROBAN APPLY 1 APPLICATION TOPICALLY TO ULCER ON BUTTOCKS TWICE A DAY   Omega-3 1000 MG Caps Take by mouth.   omeprazole 40 MG capsule Commonly known as: PRILOSEC Take 40 mg by mouth 2 (two) times daily.   OPTIVE 0.5-0.9 % ophthalmic solution Generic drug: carboxymethylcellul-glycerin Apply to eye.   Symbicort 160-4.5 MCG/ACT inhaler Generic drug: budesonide-formoterol Inhale 2 puffs into the lungs 2 (two) times daily.   traMADol 50 MG tablet Commonly known as: ULTRAM Take 50 mg by mouth every 6 (six) hours as needed for moderate pain.   triamcinolone cream 0.1 % Commonly known as: KENALOG Apply 1 application topically 2 (two) times daily.   Ultra Fresh PM Oint once daily   verapamil 120 MG CR tablet Commonly known as: CALAN-SR Take 120 mg by mouth daily.   vitamin C 250 MG tablet Commonly known as: ASCORBIC ACID Take by mouth.       Allergies:  Allergies  Allergen Reactions  . Sulfa Antibiotics Other (See Comments)    Not sure of reaction. It was too long ago, but he said not to give it to him.   . Sulfasalazine     Other reaction(s): Other (See Comments) Not sure of reaction. It was too long ago, but he said not to give it to him.   Marland Kitchen Penicillin G Rash    Family History: Family History  Problem Relation Age of Onset  . Heart attack Mother   . Heart attack Father     Social History:  reports that he has quit smoking. His smoking use included cigarettes. He has a 100.00 pack-year smoking history. He has never used smokeless tobacco. He reports current alcohol use. He reports that he does not use drugs.   Physical Exam: BP (!) 157/77   Pulse 76   Ht 5\' 11"  (1.803 m)   Wt 185 lb (83.9 kg)   BMI 25.80 kg/m    Constitutional:  Alert and oriented, No acute distress. HEENT: [place] AT, moist mucus membranes.  Trachea midline, no masses. Cardiovascular: No clubbing, cyanosis, or edema. Respiratory: Normal respiratory effort, no increased work of breathing. GI: Abdomen is soft, nontender, nondistended, no abdominal masses GU: No CVA tenderness Skin: No rashes, bruises or suspicious lesions. Neurologic: Grossly intact, no focal deficits, moving all 4 extremities. Psychiatric: Normal mood and affect.   Assessment & Plan:    - BPH with incomplete bladder emptying He is interested in an outlet procedure.  Based on age and comorbidities if a candidate  for a minimal invasive option this would be ideal.  No recent cystoscopy and will schedule cystoscopy and TRUS prostate volume.  Abbie Sons, [facility] [address] [place], [place] [address] [phone]

## 2019-12-06 ENCOUNTER — Telehealth: Payer: Self-pay | Admitting: Urology

## 2019-12-06 ENCOUNTER — Encounter: Payer: Self-pay | Admitting: Urology

## 2019-12-06 NOTE — Telephone Encounter (Signed)
At visit yesterday patient interested in proceeding with an outlet procedure.  I reviewed the chart and he has not had a recent cystoscopy.  Need to schedule cystoscopy and TRUS for volume.  Would contact his daughter [name] [phone]

## 2019-12-06 NOTE — Telephone Encounter (Signed)
APP SCHEDULED WITH Loletha Carrow

## 2019-12-09 ENCOUNTER — Telehealth: Payer: Self-pay | Admitting: Urology

## 2019-12-09 NOTE — Telephone Encounter (Signed)
Pt has decided not to proceed with Urolift procedure, Canceled CystoTrus appt and made 6 mos follow up.

## 2019-12-19 ENCOUNTER — Other Ambulatory Visit: Payer: Medicare Other | Admitting: Urology

## 2019-12-23 ENCOUNTER — Other Ambulatory Visit: Payer: Self-pay

## 2019-12-23 ENCOUNTER — Ambulatory Visit (INDEPENDENT_AMBULATORY_CARE_PROVIDER_SITE_OTHER): Payer: No Typology Code available for payment source | Admitting: Dermatology

## 2019-12-23 DIAGNOSIS — Z872 Personal history of diseases of the skin and subcutaneous tissue: Secondary | ICD-10-CM

## 2019-12-23 DIAGNOSIS — D485 Neoplasm of uncertain behavior of skin: Secondary | ICD-10-CM

## 2019-12-23 DIAGNOSIS — L98411 Non-pressure chronic ulcer of buttock limited to breakdown of skin: Secondary | ICD-10-CM

## 2019-12-23 DIAGNOSIS — L409 Psoriasis, unspecified: Secondary | ICD-10-CM

## 2019-12-23 DIAGNOSIS — Z85828 Personal history of other malignant neoplasm of skin: Secondary | ICD-10-CM

## 2019-12-23 DIAGNOSIS — L578 Other skin changes due to chronic exposure to nonionizing radiation: Secondary | ICD-10-CM

## 2019-12-23 DIAGNOSIS — L820 Inflamed seborrheic keratosis: Secondary | ICD-10-CM

## 2019-12-23 DIAGNOSIS — L98491 Non-pressure chronic ulcer of skin of other sites limited to breakdown of skin: Secondary | ICD-10-CM

## 2019-12-23 NOTE — Progress Notes (Addendum)
   Follow-Up Visit   Subjective  Lonnie [name]. is a 86 y.o. male who presents for the following: Follow-up (Biopsy follow up - BCC of left lat forehead. Treated with EDC.) and Other (History of decubitus ulcer left buttock. Doing well now.).  Pt has a new spot on nose.  The following portions of the chart were reviewed this encounter and updated as appropriate:     Review of Systems: No other skin or systemic complaints.  Objective  Well appearing patient in no apparent distress; mood and affect are within normal limits.  A focused examination was performed including face. Relevant physical exam findings are noted in the Assessment and Plan.  Objective  Head - Anterior (Face): Photoprotection and sunscreen.  Objective  Left Lat Forehead: Well healed scar with no evidence of recurrence.   Objective  left medial cheek: Erythematous keratotic or waxy stuck-on papule or plaque.   Objective  Left Ala Nasi: 0.5 cm pearly papule.  Objective  Left buttock: History of decubitus ulcer. Resolved per patient (not examined today).  Objective  trunk: Currently controlled.  Assessment & Plan  Actinic skin damage Head - Anterior (Face)  History of basal cell carcinoma (BCC) Left Lat Forehead  Observe   Inflamed seborrheic keratosis left medial cheek  Destruction of lesion - left medial cheek Complexity: simple   Destruction method: cryotherapy   Informed consent: discussed and consent obtained   Timeout:  patient name, date of birth, surgical site, and procedure verified Lesion destroyed using liquid nitrogen: Yes   Region frozen until ice ball extended beyond lesion: Yes   Outcome: patient tolerated procedure well with no complications   Post-procedure details: wound care instructions given    Neoplasm of uncertain behavior of skin Left Ala Nasi  Epidermal / dermal shaving  Lesion length (cm):  0.5 Lesion width (cm):  0.5 Margin per side (cm):  0.2 Total  excision diameter (cm):  0.9 Informed consent: discussed and consent obtained   Timeout: patient name, date of birth, surgical site, and procedure verified   Procedure prep:  Patient was prepped and draped in usual sterile fashion Prep type:  Isopropyl alcohol Anesthesia: the lesion was anesthetized in a standard fashion   Anesthetic:  1% lidocaine w/ epinephrine 1-100,000 nerve block Instrument used: flexible razor blade   Hemostasis achieved with: pressure and aluminum chloride   Outcome: patient tolerated procedure well    Destruction of lesion Complexity: extensive   Destruction method: electrodesiccation and curettage   Informed consent: discussed and consent obtained   Curettage performed in three different directions: Yes   Electrodesiccation performed over the curetted area: Yes   Hemostasis achieved with:  pressure, aluminum chloride and electrodesiccation Outcome: patient tolerated procedure well with no complications   Post-procedure details: sterile dressing applied and wound care instructions given   Dressing type: bandage and bacitracin    Specimen 1 - Surgical pathology Differential Diagnosis: BCC vs other  Check Margins: No 0.5 cm pearly papule  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   Return in about 3 months (around 03/24/2020).   I, Ashok Cordia, CMA, am acting as scribe for Sarina Ser, MD .

## 2019-12-23 NOTE — Patient Instructions (Signed)

## 2019-12-26 ENCOUNTER — Telehealth: Payer: Self-pay

## 2019-12-26 NOTE — Telephone Encounter (Signed)
Patient informed of pathology results 

## 2020-03-25 ENCOUNTER — Telehealth: Payer: Self-pay

## 2020-03-25 NOTE — Telephone Encounter (Signed)
OK to send refills for Duoderm

## 2020-03-25 NOTE — Telephone Encounter (Signed)
[facility] order sent in and daughter advised.

## 2020-03-25 NOTE — Telephone Encounter (Signed)
Daughter called in regarding patients appointment tomorrow. He just had his dermatology check up yesterday at the [facility] and wanted to cancel tomorrows appointment here since its hard for patient to get up and out now. They were asking if we could send in a RX for DuoDerm patches to [facility] for patient to use in between his shipments from the [place]? He uses them for the sores on his buttocks. Advised daughter I would ask Dr. Nehemiah Massed and to see what would be appropriate treatment for patient.

## 2020-03-26 ENCOUNTER — Ambulatory Visit: Payer: No Typology Code available for payment source | Admitting: Dermatology

## 2020-05-21 ENCOUNTER — Ambulatory Visit: Payer: No Typology Code available for payment source | Admitting: Physician Assistant

## 2020-06-21 NOTE — Progress Notes (Signed)
06/22/2020 2:15 PM   Lonnie [name]. 1927/07/16 [id number]  Referring provider: Derinda Late, MD [phone] [facility] [place],  [place] [address] Chief Complaint  Patient presents with  . Benign Prostatic Hypertrophy    HPI: Lonnie [name]. is a 86 y.o. male who returns for a 6 month follow up.    -urodynamic study which did show obstruction and a hyposensitive bladder.   -significant medical comorbidities. -remains on alfuzosin and finasteride. -doing well since last visit.  - states today he is interested in pursuing UroLift -no prior cystoscopy or TRUS -Denies any changes in his urination.   PMH: Past Medical History:  Diagnosis Date  . Basal cell carcinoma 11/13/2009   Right alar groove  . Basal cell carcinoma 04/28/2010   left preauricular, BCC with focal sclerosis. Exc. 06/04/2010  . Basal cell carcinoma 04/24/2013   right post auricular  . Basal cell carcinoma 10/26/2017   right inferior cheek above mandible, Superficial  . Basal cell carcinoma 03/22/2018   right post. base of neck  . Basal cell carcinoma 05/08/2018   post. neck, spinal, Superficial. EDC 05/08/2018  . Basal cell carcinoma 07/23/2018   left mid supraclavicular, Superficial and nodular pattern. EDC  . Basal cell carcinoma 07/23/2018   right superior chest parasternal. Superficial and nodular pattern. EDC  . Basal cell carcinoma 05/16/2019   left lateral neck. Superficial and nodular pattern. EDC  . Basal cell carcinoma 11/07/2019   left lateral forehead. Nodular. EDC  . Bundle branch block, left   . COPD (chronic obstructive pulmonary disease) ([place])   . Coronary artery disease   . Hypertension   . PSA elevation   . Renal disorder     Surgical History: Past Surgical History:  Procedure Laterality Date  . APPENDECTOMY    . RECTAL SURGERY     x3    Home Medications:  Allergies as of 06/22/2020      Reactions   Sulfa Antibiotics Other  (See Comments)   Not sure of reaction. It was too long ago, but he said not to give it to him.    Sulfasalazine    Other reaction(s): Other (See Comments) Not sure of reaction. It was too long ago, but he said not to give it to him.    Penicillin G Rash      Medication List       Accurate as of June 22, 2020  2:15 PM. If you have any questions, ask your nurse or doctor.        alfuzosin 10 MG 24 hr tablet Commonly known as: UROXATRAL Take 10 mg by mouth daily with breakfast.   allopurinol 300 MG tablet Commonly known as: ZYLOPRIM Take 300 mg by mouth daily.   aspirin 81 MG tablet Take 81 mg by mouth daily.   atorvastatin 40 MG tablet Commonly known as: LIPITOR Take 40 mg by mouth daily.   bisacodyl 5 MG EC tablet Commonly known as: DULCOLAX Take by mouth.   docusate sodium 50 MG capsule Commonly known as: COLACE Take by mouth.   feeding supplement (ENSURE ENLIVE) Liqd Take 237 mLs by mouth 3 (three) times daily.   ferrous sulfate 325 (65 FE) MG tablet Take 325 mg by mouth 2 (two) times daily with a meal.   finasteride 5 MG tablet Commonly known as: PROSCAR Take 5 mg by mouth daily.   gabapentin 300 MG capsule Commonly known as: NEURONTIN Take  600 mg by mouth 2 (two) times daily.   hydrochlorothiazide 25 MG tablet Commonly known as: HYDRODIURIL Take 25 mg by mouth daily.   Ipratropium-Albuterol 20-100 MCG/ACT Aers respimat Commonly known as: COMBIVENT Inhale 1 puff into the lungs daily.   ipratropium-albuterol 0.5-2.5 (3) MG/3ML Soln Commonly known as: DUONEB Take 3 mLs by nebulization 4 (four) times daily as needed (wheezing).   lisinopril 40 MG tablet Commonly known as: ZESTRIL Take 40 mg by mouth daily.   magnesium oxide 400 MG tablet Commonly known as: MAG-OX Take 400 mg by mouth daily.   melatonin 5 MG Tabs Take 5 mg by mouth at bedtime.   mometasone 0.1 % lotion Commonly known as: ELOCON APPLY 1 ML TOPICALLY TO AFFECTED AREAS ONCE  DAILY FOR PSORIASIS OF THE SCALP UP TO 5 TO 7 DAYS   mupirocin ointment 2 % Commonly known as: BACTROBAN APPLY 1 APPLICATION TOPICALLY TO ULCER ON BUTTOCKS TWICE A DAY   Omega-3 1000 MG Caps Take by mouth.   omeprazole 40 MG capsule Commonly known as: PRILOSEC Take 40 mg by mouth 2 (two) times daily.   OPTIVE 0.5-0.9 % ophthalmic solution Generic drug: carboxymethylcellul-glycerin Apply to eye.   Symbicort 160-4.5 MCG/ACT inhaler Generic drug: budesonide-formoterol Inhale 2 puffs into the lungs 2 (two) times daily.   traMADol 50 MG tablet Commonly known as: ULTRAM Take 50 mg by mouth every 6 (six) hours as needed for moderate pain.   triamcinolone cream 0.1 % Commonly known as: KENALOG Apply 1 application topically 2 (two) times daily.   Ultra Fresh PM Oint once daily   verapamil 120 MG CR tablet Commonly known as: CALAN-SR Take 120 mg by mouth daily.   vitamin C 250 MG tablet Commonly known as: ASCORBIC ACID Take by mouth.       Allergies:  Allergies  Allergen Reactions  . Sulfa Antibiotics Other (See Comments)    Not sure of reaction. It was too long ago, but he said not to give it to him.   . Sulfasalazine     Other reaction(s): Other (See Comments) Not sure of reaction. It was too long ago, but he said not to give it to him.   Marland Kitchen Penicillin G Rash    Family History: Family History  Problem Relation Age of Onset  . Heart attack Mother   . Heart attack Father     Social History:  reports that he has quit smoking. His smoking use included cigarettes. He has a 100.00 pack-year smoking history. He has never used smokeless tobacco. He reports current alcohol use. He reports that he does not use drugs.   Physical Exam: BP (!) 143/73   Pulse 80   Ht 5\' 11"  (1.803 m)   Wt 187 lb (84.8 kg)   BMI 26.08 kg/m   Constitutional:  Alert and oriented, No acute distress. HEENT: [place] AT, moist mucus membranes.  Trachea midline, no masses. Cardiovascular: No  clubbing, cyanosis, or edema. Respiratory: Normal respiratory effort, no increased work of breathing. Skin: No rashes, bruises or suspicious lesions. Neurologic: Grossly intact, no focal deficits, moving all 4 extremities. Psychiatric: Normal mood and affect.   Assessment & Plan:    1. BPH with incomplete bladder emptying   Interested in pursuing UroLift  Will schedule cystoscopy/TRUS prostate    [facility] [address], [place], [place] [address] [phone]  I, Selena Batten, am acting as a scribe for Dr. Nicki Reaper C. Maddock Finigan,  I have reviewed the  above documentation for accuracy and completeness, and I agree with the above.   Abbie Sons, MD

## 2020-06-22 ENCOUNTER — Other Ambulatory Visit: Payer: Self-pay

## 2020-06-22 ENCOUNTER — Ambulatory Visit (INDEPENDENT_AMBULATORY_CARE_PROVIDER_SITE_OTHER): Payer: Medicare Other | Admitting: Urology

## 2020-06-22 ENCOUNTER — Encounter: Payer: Self-pay | Admitting: Urology

## 2020-06-22 VITALS — BP 143/73 | HR 80 | Ht 71.0 in | Wt 187.0 lb

## 2020-06-22 DIAGNOSIS — N401 Enlarged prostate with lower urinary tract symptoms: Secondary | ICD-10-CM

## 2020-06-22 DIAGNOSIS — N312 Flaccid neuropathic bladder, not elsewhere classified: Secondary | ICD-10-CM

## 2020-06-22 DIAGNOSIS — N138 Other obstructive and reflux uropathy: Secondary | ICD-10-CM | POA: Diagnosis not present

## 2020-06-22 DIAGNOSIS — R339 Retention of urine, unspecified: Secondary | ICD-10-CM | POA: Diagnosis not present

## 2020-06-24 ENCOUNTER — Encounter: Payer: Self-pay | Admitting: Urology

## 2020-07-13 ENCOUNTER — Encounter: Payer: Self-pay | Admitting: Urology

## 2020-07-15 ENCOUNTER — Other Ambulatory Visit: Payer: Self-pay

## 2020-07-15 ENCOUNTER — Ambulatory Visit (INDEPENDENT_AMBULATORY_CARE_PROVIDER_SITE_OTHER): Payer: Medicare Other | Admitting: Dermatology

## 2020-07-15 DIAGNOSIS — L821 Other seborrheic keratosis: Secondary | ICD-10-CM | POA: Diagnosis not present

## 2020-07-15 DIAGNOSIS — Z85828 Personal history of other malignant neoplasm of skin: Secondary | ICD-10-CM

## 2020-07-15 DIAGNOSIS — L570 Actinic keratosis: Secondary | ICD-10-CM

## 2020-07-15 DIAGNOSIS — L578 Other skin changes due to chronic exposure to nonionizing radiation: Secondary | ICD-10-CM

## 2020-07-15 DIAGNOSIS — L820 Inflamed seborrheic keratosis: Secondary | ICD-10-CM

## 2020-07-15 NOTE — Patient Instructions (Signed)
Cryotherapy Aftercare  . Wash gently with soap and water everyday.   . Apply Vaseline and Band-Aid daily until healed.  

## 2020-07-15 NOTE — Progress Notes (Signed)
   Follow-Up Visit   Subjective  Lonnie [name] Weed. is a 86 y.o. male who presents for the following: Skin Problem (Pt presents with irritated itchy spots scattered over the body, pt would like this areas removed ). He has history of multiple basal cell carcinomas treated in the past.  He has other spots be checked today.  The following portions of the chart were reviewed this encounter and updated as appropriate:  Tobacco  Allergies  Meds  Problems  Med Hx  Surg Hx  Fam Hx     Review of Systems:  No other skin or systemic complaints except as noted in HPI or Assessment and Plan.  Objective  Well appearing patient in no apparent distress; mood and affect are within normal limits.  A focused examination was performed including face, arms, back, legs. Relevant physical exam findings are noted in the Assessment and Plan.  Objective  L cheek, L tricep, L side, R leg, back (21): Erythematous keratotic or waxy stuck-on papule or plaque.   Objective  forehead, L cheek (3): Erythematous thin papules/macules with gritty scale.   Objective  multiple see history: Well healed scar with no evidence of recurrence.    Assessment & Plan  Inflamed seborrheic keratosis (21) L cheek, L tricep, L side, R leg, back  Destruction of lesion - L cheek, L tricep, L side, R leg, back Complexity: simple   Destruction method: cryotherapy   Informed consent: discussed and consent obtained   Timeout:  patient name, date of birth, surgical site, and procedure verified Lesion destroyed using liquid nitrogen: Yes   Region frozen until ice ball extended beyond lesion: Yes   Outcome: patient tolerated procedure well with no complications   Post-procedure details: wound care instructions given    AK (actinic keratosis) (3) forehead, L cheek  Destruction of lesion - forehead, L cheek Complexity: simple   Destruction method: cryotherapy   Informed consent: discussed and consent obtained   Timeout:   patient name, date of birth, surgical site, and procedure verified Lesion destroyed using liquid nitrogen: Yes   Region frozen until ice ball extended beyond lesion: Yes   Outcome: patient tolerated procedure well with no complications   Post-procedure details: wound care instructions given    History of basal cell carcinoma (BCC) multiple see history  Clear. Observe for recurrence. Call clinic for new or changing lesions.  Recommend regular skin exams, daily broad-spectrum spf 30+ sunscreen use, and photoprotection.     Seborrheic Keratoses - Stuck-on, waxy, tan-brown papules and plaques  - Discussed benign etiology and prognosis. - Observe - Call for any changes  Actinic Damage - diffuse scaly erythematous macules with underlying dyspigmentation - Recommend daily broad spectrum sunscreen SPF 30+ to sun-exposed areas, reapply every 2 hours as needed.  - Call for new or changing lesions.   Return in about 6 months (around 01/13/2021).  IMarye Round, CMA, am acting as scribe for Sarina Ser, MD .  Documentation: I have reviewed the above documentation for accuracy and completeness, and I agree with the above.  Sarina Ser, MD

## 2020-07-16 ENCOUNTER — Encounter: Payer: Self-pay | Admitting: Dermatology

## 2020-07-21 ENCOUNTER — Ambulatory Visit: Payer: No Typology Code available for payment source | Admitting: Dermatology

## 2020-09-04 ENCOUNTER — Encounter: Payer: Self-pay | Admitting: Urology

## 2020-09-04 ENCOUNTER — Other Ambulatory Visit: Payer: Self-pay

## 2020-09-04 ENCOUNTER — Ambulatory Visit (INDEPENDENT_AMBULATORY_CARE_PROVIDER_SITE_OTHER): Payer: Medicare Other | Admitting: Urology

## 2020-09-04 VITALS — BP 148/74 | HR 82 | Ht 68.0 in | Wt 187.0 lb

## 2020-09-04 DIAGNOSIS — N312 Flaccid neuropathic bladder, not elsewhere classified: Secondary | ICD-10-CM

## 2020-09-04 DIAGNOSIS — N320 Bladder-neck obstruction: Secondary | ICD-10-CM

## 2020-09-04 MED ORDER — CIPROFLOXACIN HCL 500 MG PO TABS
500.0000 mg | ORAL_TABLET | Freq: Once | ORAL | Status: AC
  Administered 2020-09-04: 500 mg via ORAL

## 2020-09-06 NOTE — Progress Notes (Signed)
09/06/20  Chief Complaint  Patient presents with  . Cysto     HPI: 86 year old male with refractory urinary symptoms related to BPH who presents today to the office for cystoscopy and prostate sizing   Please see previous notes for details.     Blood pressure (!) 148/74, pulse 82, height 5\' 8"  (1.727 m), weight 187 lb (84.8 kg). NED. A&Ox3.   No respiratory distress   Abd soft, NT, ND Normal phallus with bilateral descended testicles    Cystoscopy Procedure Note  Patient identification was confirmed, informed consent was obtained, and patient was prepped using Betadine solution.  Lidocaine jelly was administered per urethral meatus.    Preoperative abx where received prior to procedure.     Pre-Procedure: - Inspection reveals a normal caliber urethral meatus.  Procedure: The flexible cystoscope was introduced without difficulty - No urethral strictures/lesions are present. - Mild lateral lobe enlargement prostate  - Mild elevation bladder neck - Bilateral ureteral orifices identified - Bladder mucosa  reveals no ulcers, tumors, or lesions - No bladder stones - No trabeculation  Retroflexion shows no abnormalities   Post-Procedure: - Patient tolerated the procedure well  Assessment/ Plan:  Mild lateral lobe prostate enlargement   Prostate transrectal ultrasound sizing   Informed consent was obtained after discussing risks/benefits of the procedure.  A time out was performed to ensure correct patient identity.   Pre-Procedure: -Transrectal probe was placed without difficulty -Transrectal Ultrasound performed revealing a  27 gm prostate measuring 3.9 x 4.6 x 3.9 cm (length) -No significant hypoechoic or median lobe noted      Assessment/ Plan:  Mild prostate enlargement however prior urodynamic study did not show obstruction  He does have incomplete bladder emptying and hypersensitive bladder was also noted on urodynamics  Discussed with patient and his  daughter that any outlet procedure including UroLift or TURP may not improve his symptoms or bladder emptying  He is interested in scheduling UroLift as opposed to other procedures due to the minimally invasive nature ability to perform under sedation  The procedure was discussed in detail occluding most common side effects of irritative voiding symptoms, hematuria and dysuria  He indicated all questions were answered and desires to proceed    Abbie Sons, MD

## 2020-09-14 ENCOUNTER — Other Ambulatory Visit: Payer: Self-pay | Admitting: Radiology

## 2020-09-21 ENCOUNTER — Other Ambulatory Visit: Payer: Self-pay | Admitting: Family Medicine

## 2020-09-21 DIAGNOSIS — N401 Enlarged prostate with lower urinary tract symptoms: Secondary | ICD-10-CM

## 2020-09-24 ENCOUNTER — Encounter
Admission: RE | Admit: 2020-09-24 | Discharge: 2020-09-24 | Disposition: A | Discharge status: Home / Self Care | Payer: No Typology Code available for payment source | Source: Ambulatory Visit | Attending: Urology | Admitting: Urology

## 2020-09-24 HISTORY — DX: Anemia, unspecified: D64.9

## 2020-09-24 HISTORY — DX: Family history of other specified conditions: Z84.89

## 2020-09-24 HISTORY — DX: Gastro-esophageal reflux disease without esophagitis: K21.9

## 2020-09-24 NOTE — Patient Instructions (Addendum)
Your procedure is scheduled on:10-06-20 TUESDAY Report to the Registration Desk on the 1st floor of the Medical Mall-Then proceed to the 2nd floor Surgery Desk in the Medical Mall To find out your arrival time, please call [phone] between 1PM - 3PM on:10-05-20 MONDAY  REMEMBER: Instructions that are not followed completely may result in serious medical risk, up to and including death; or upon the discretion of your surgeon and anesthesiologist your surgery may need to be rescheduled.  Do not eat food after midnight the night before surgery.  No gum chewing, lozengers or hard candies.  You may however, drink CLEAR liquids up to 2 hours before you are scheduled to arrive for your surgery. Do not drink anything within 2 hours of your scheduled arrival time.  Clear liquids include: - water  - apple juice without pulp - gatorade - black coffee or tea (Do NOT add milk or creamers to the coffee or tea) Do NOT drink anything that is not on this list.  TAKE THESE MEDICATIONS THE MORNING OF SURGERY WITH A SIP OF WATER: -ALLOPURINOL (ZYLOPRIM) -ALFUZOSIN (UROXATRAL) -LIPITOR (ATORVASTATIN)  -NIFEDIPINE (PROCARDIA) -GABAPENTIN (NEURONTIN) -PRILOSEC (OMEPRAZOLE)  Use inhalers on the day of surgery and bring to the hospital-USE YOUR SYMBICORT AND COMBIVENT THE MORNING OF SURGERY  Follow recommendations from Cardiologist, Pulmonologist or PCP regarding stopping Aspirin, Coumadin, Plavix, Eliquis, Pradaxa, or Pletal-INSTRUCTED BY DR STOIOFF TO STOP ASPIRIN ON 09-29-21-LAST DOSE ON 09-28-21 (Monday)  One week prior to surgery: Stop Anti-inflammatories (NSAIDS) such as Advil, Aleve, Ibuprofen, Motrin, Naproxen, Naprosyn and Aspirin based products such as Excedrin, Goodys Powder, BC Powder-OK TO TAKE TYLENOL/TRAMADOL IF NEEDED  Stop ANY OVER THE COUNTER supplements until after surgery-STOP FISH OIL, VITAMIN C AND MELATONIN NOW-YOU MAY RESUME AFTER SURGERY (However, you may continue taking  Magnesium, Vitamin B12 and multivitamin up until the day before surgery.)  No Alcohol for 24 hours before or after surgery.  No Smoking including e-cigarettes for 24 hours prior to surgery.  No chewable tobacco products for at least 6 hours prior to surgery.  No nicotine patches on the day of surgery.  Do not use any "recreational" drugs for at least a week prior to your surgery.  Please be advised that the combination of cocaine and anesthesia may have negative outcomes, up to and including death. If you test positive for cocaine, your surgery will be cancelled.  On the morning of surgery brush your teeth with toothpaste and water, you may rinse your mouth with mouthwash if you wish. Do not swallow any toothpaste or mouthwash.  Do not wear jewelry, make-up, hairpins, clips or nail polish.  Do not wear lotions, powders, or perfumes.   Do not shave body from the neck down 48 hours prior to surgery just in case you cut yourself which could leave a site for infection.  Also, freshly shaved skin may become irritated if using the CHG soap.  Contact lenses, hearing aids and dentures may not be worn into surgery.  Do not bring valuables to the hospital. [facility] is not responsible for any missing/lost belongings or valuables.   Notify your doctor if there is any change in your medical condition (cold, fever, infection).  Wear comfortable clothing (specific to your surgery type) to the hospital.  Plan for stool softeners for home use; pain medications have a tendency to cause constipation. You can also help prevent constipation by eating foods high in fiber such as fruits and vegetables and drinking plenty of fluids as your  diet allows.  After surgery, you can help prevent lung complications by doing breathing exercises.  Take deep breaths and cough every 1-2 hours. Your doctor may order a device called an Incentive Spirometer to help you take deep breaths. When coughing or sneezing,  hold a pillow firmly against your incision with both hands. This is called "splinting." Doing this helps protect your incision. It also decreases belly discomfort.  If you are being admitted to the hospital overnight, leave your suitcase in the car. After surgery it may be brought to your room.  If you are being discharged the day of surgery, you will not be allowed to drive home. You will need a responsible adult (18 years or older) to drive you home and stay with you that night.   If you are taking public transportation, you will need to have a responsible adult (18 years or older) with you. Please confirm with your physician that it is acceptable to use public transportation.   Please call the [facility] at [phone] if you have any questions about these instructions.  Visitation Policy:  Patients undergoing a surgery or procedure may have one family member or support person with them as long as that person is not COVID-19 positive or experiencing its symptoms.  That person may remain in the waiting area during the procedure.  Inpatient Visitation Update:   In an effort to ensure the safety of our team members and our patients, we are implementing a change to our visitation policy:  Effective Monday, Aug. 9, at 7 a.m., inpatients will be allowed one support person.  o The support person may change daily.  o The support person must pass our screening, gel in and out, and wear a mask at all times, including in the patient's room.  o Patients must also wear a mask when staff or their support person are in the room.  o Masking is required regardless of vaccination status.  Systemwide, no visitors 17 or younger.

## 2020-09-28 ENCOUNTER — Other Ambulatory Visit: Payer: Medicare Other

## 2020-09-28 ENCOUNTER — Other Ambulatory Visit: Payer: Self-pay

## 2020-09-28 ENCOUNTER — Encounter
Admission: RE | Admit: 2020-09-28 | Discharge: 2020-09-28 | Disposition: A | Discharge status: Home / Self Care | Payer: Medicare Other | Source: Ambulatory Visit | Attending: Family Medicine | Admitting: Family Medicine

## 2020-09-28 DIAGNOSIS — Z01818 Encounter for other preprocedural examination: Secondary | ICD-10-CM | POA: Insufficient documentation

## 2020-09-28 DIAGNOSIS — I10 Essential (primary) hypertension: Secondary | ICD-10-CM | POA: Diagnosis not present

## 2020-09-28 DIAGNOSIS — N401 Enlarged prostate with lower urinary tract symptoms: Secondary | ICD-10-CM

## 2020-09-28 DIAGNOSIS — Z01810 Encounter for preprocedural cardiovascular examination: Secondary | ICD-10-CM | POA: Diagnosis not present

## 2020-09-28 LAB — CBC
HCT: 31.8 % — ABNORMAL LOW (ref 39.0–52.0)
Hemoglobin: 10.2 g/dL — ABNORMAL LOW (ref 13.0–17.0)
MCH: 32.5 pg (ref 26.0–34.0)
MCHC: 32.1 g/dL (ref 30.0–36.0)
MCV: 101.3 fL — ABNORMAL HIGH (ref 80.0–100.0)
Platelets: 169 10*3/uL (ref 150–400)
RBC: 3.14 MIL/uL — ABNORMAL LOW (ref 4.22–5.81)
RDW: 13.7 % (ref 11.5–15.5)
WBC: 8.6 10*3/uL (ref 4.0–10.5)
nRBC: 0 % (ref 0.0–0.2)

## 2020-09-28 LAB — BASIC METABOLIC PANEL
Anion gap: 10 (ref 5–15)
BUN: 24 mg/dL — ABNORMAL HIGH (ref 8–23)
CO2: 25 mmol/L (ref 22–32)
Calcium: 9.2 mg/dL (ref 8.9–10.3)
Chloride: 104 mmol/L (ref 98–111)
Creatinine, Ser: 1.3 mg/dL — ABNORMAL HIGH (ref 0.61–1.24)
GFR, Estimated: 52 mL/min — ABNORMAL LOW (ref >60)
Glucose, Bld: 151 mg/dL — ABNORMAL HIGH (ref 70–99)
Potassium: 3.7 mmol/L (ref 3.5–5.1)
Sodium: 139 mmol/L (ref 135–145)

## 2020-09-29 ENCOUNTER — Encounter: Payer: Self-pay | Admitting: Urology

## 2020-09-29 LAB — URINALYSIS, COMPLETE
Bilirubin, UA: NEGATIVE
Glucose, UA: NEGATIVE
Ketones, UA: NEGATIVE
Nitrite, UA: NEGATIVE
Specific Gravity, UA: 1.015 (ref 1.005–1.030)
Urobilinogen, Ur: 0.2 mg/dL (ref 0.2–1.0)
pH, UA: 7 (ref 5.0–7.5)

## 2020-09-29 LAB — MICROSCOPIC EXAMINATION

## 2020-09-29 NOTE — Progress Notes (Signed)
[facility] Perioperative Services  Pre-Admission/Anesthesia Testing Clinical Review  Date: 10/01/20  Patient Demographics:  Name: Lonnie [name]. DOB:   1927/08/20 MRN:   [id number]  Planned Surgical Procedure(s):    Case: [id number] Date/Time: 10/06/20 1113   Procedure: CYSTOSCOPY WITH INSERTION OF UROLIFT (N/A )   Anesthesia type: Monitor Anesthesia Care   Pre-op diagnosis: benign prostatic hypertrophy with incomplete bladder emptying   Location: [facility] OR ROOM 10 / [facility] ORS FOR ANESTHESIA GROUP   Surgeons: Abbie Sons, MD    NOTE: Available PAT nursing documentation and vital signs have been reviewed. Clinical nursing staff has updated patient's PMH/PSHx, current medication list, and drug allergies/intolerances to ensure comprehensive history available to assist in medical decision making as it pertains to the aforementioned surgical procedure and anticipated anesthetic course.   Clinical Discussion:  Lonnie [name]. is a 86 y.o. male who is submitted for pre-surgical anesthesia review and clearance prior to him undergoing the above procedure. Patient is a Former Smoker (100 pack years; quit 09/1989). Pertinent PMH includes: CAD, LBBB, HTN, HLD, COPD, asbestosis, CKD-III, GERD (on daily PPI), anemia, BPH.  This patient is followed by pulmonary medicine Raul Del, MD).  He was last seen in the pulmonology clinic on 06/24/2020; notes reviewed.  Patient noted to be stable overall.  Respiratory status at baseline.  Patient denied any new symptom.  PMH (+) for asbestosis; last CT imaging of the chest in 03/2019 revealed a subpleural LLL nodule measuring 4 mm. Compliant with prescribed respiratory medications.  PFTs from 12/2019 revealed mild COPD; FEV1 1.89 (77% of predicted).  Patient currently on ICS + LABA and SAAC inhalers.  No changes were made to patient medication regimen.  Patient to follow-up with outpatient pulmonary medicine for ongoing care and  monitoring.  Patient is followed by cardiology Saralyn Pilar, MD). He was last seen in the cardiology clinic in 05/2016 with plans for RTC visit in 6 months. Patient advised PAT RN that there was "nothing wrong with his heart" and thus he did not return; lost to follow up. Prior to upcoming urological procedure, cardiology evaluation was felt to be necessary given patient's age, PMH, and length of time since being seen by cardiology service. Appointment was made for patient by PAT APP; patient was scheduled to be seen on 10/01/2020.  Patient seen in follow up consult by Dr. Saralyn Pilar (cardiology) on 10/01/2020; notes reviewed. At the time of his clinic visit patient reported that he was "doing good" overall.  Patient denied chest pain, however reported mild chronic exertional dyspnea related to his underlying COPD.  No PND, orthopnea, or palpitations.  He reported intermittent issues with peripheral edema.  Patient very active outdoors. Functional capacity, as defined by DASI, is documented as being >/= 4 METS.  Stress echocardiogram performed on 06/24/2014 revealed normal left ventricular function without evidence of ischemia.  TTE performed on 07/16/2018 revealed moderately reduced left ventricular systolic function; LVEF 123456.  There was severe hypokinesis of the anteroseptal and anterior myocardium (see full interpretation of cardiovascular testing below).  Patient is on GDMT for his hypertension diagnosis.  Blood pressure elevated at 150/62 in the office despite currently prescribed CCB, diuretic, and ACEi therapy.  Patient is on a statin for his HLD.  ECG in the office revealed sinus rhythm with first-degree AV block and chronic LBBB.  There were no changes made to patient's treatment/medication regimen.  Patient to follow-up with outpatient cardiology in 6 months  Patient is scheduled for an  elective urological procedure on 10/06/2020 with Dr. Irineo Axon.  Given patient's past medical history  significant for cardiovascular issues, mainly his CAD and LBBB, presurgical cardiac clearance was sought by the PAT team.  Per cardiology, "the patient should be at an overall LOW risk for serious perioperative cardiovascular complications, and there for may proceed with the planned cystoscopy with UroLift insertion without further cardiac diagnostic testing".  This patient is on daily antiplatelet therapy.  He was instructed on recommendations from his urologist on holding his daily low-dose ASA for 7 days prior to his procedure with plans to restart immediately postoperatively.  The patient has been instructed that his last dose of ASA will be on 09/28/2020.  He denies previous perioperative complications with anesthesia.  Vitals with BMI 09/04/2020 06/22/2020 12/05/2019  Height 5\' 8"  5\' 11"  5\' 11"   Weight 187 lbs 187 lbs 185 lbs  BMI 28.44 26.09 25.81  Systolic 148 143  Diastolic 74 73 77  Pulse 82 80 76    Providers/Specialists:   NOTE: Primary physician provider listed below. Patient may have been seen by APP or partner within same practice.   PROVIDER ROLE / SPECIALTY LAST , MD Urology (Surgeon)  09/04/2020  161, MD Primary Care Provider  05/21/2019  14/12/2019, MD Cardiology  10/01/2020  05/23/2019, MD Pulmonology  06/24/2020   Allergies:  Sulfa antibiotics, Sulfasalazine, and Penicillin g  Current Home Medications:   No current facility-administered medications for this encounter.   10/03/2020 alfuzosin (UROXATRAL) 10 MG 24 hr tablet  . allopurinol (ZYLOPRIM) 300 MG tablet  . aspirin 81 MG tablet  . atorvastatin (LIPITOR) 40 MG tablet  . budesonide-formoterol (SYMBICORT) 160-4.5 MCG/ACT inhaler  . ferrous sulfate 325 (65 FE) MG tablet  . finasteride (PROSCAR) 5 MG tablet  . gabapentin (NEURONTIN) 300 MG capsule  . Ipratropium-Albuterol (COMBIVENT RESPIMAT) 20-100 MCG/ACT AERS respimat  . lisinopril (PRINIVIL,ZESTRIL) 40 MG tablet   . Magnesium Oxide 250 MG TABS  . Melatonin 5 MG TABS  . Multiple Vitamins-Minerals (MULTIVITAMIN WITH MINERALS) tablet  . NIFEdipine (PROCARDIA-XL/NIFEDICAL-XL) 30 MG 24 hr tablet  . Omega-3 1000 MG CAPS  . omeprazole (PRILOSEC) 20 MG capsule  . sennosides-docusate sodium (SENOKOT-S) 8.6-50 MG tablet  . traMADol (ULTRAM) 50 MG tablet  . vitamin B-12 (CYANOCOBALAMIN) 1000 MCG tablet  . vitamin C (ASCORBIC ACID) 250 MG tablet   History:   Past Medical History:  Diagnosis Date  . Anemia   . Basal cell carcinoma 11/13/2009   Right alar groove  . Basal cell carcinoma 04/28/2010   left preauricular, BCC with focal sclerosis. Exc. 06/04/2010  . Basal cell carcinoma 04/24/2013   right post auricular  . Basal cell carcinoma 10/26/2017   right inferior cheek above mandible, Superficial  . Basal cell carcinoma 03/22/2018   right post. base of neck  . Basal cell carcinoma 05/08/2018   post. neck, spinal, Superficial. EDC 05/08/2018  . Basal cell carcinoma 07/23/2018   left mid supraclavicular, Superficial and nodular pattern. EDC  . Basal cell carcinoma 07/23/2018   right superior chest parasternal. Superficial and nodular pattern. EDC  . Basal cell carcinoma 05/16/2019   left lateral neck. Superficial and nodular pattern. EDC  . Basal cell carcinoma 11/07/2019   left lateral forehead. Nodular. EDC  . Basal cell carcinoma 12/23/2019   Left ala nasi. BCC with sclerosis.  . Bundle branch block, left   . CKD (chronic kidney disease) stage 3, GFR 30-59 ml/min (HCC)   .  COPD (chronic obstructive pulmonary disease) ([place])   . Coronary artery disease   . Family history of adverse reaction to anesthesia    DAUGHTER-N/V  . GERD (gastroesophageal reflux disease)   . History of asbestosis   . HLD (hyperlipidemia)   . Hypertension   . Pneumonia 2017  . PSA elevation    Past Surgical History:  Procedure Laterality Date  . APPENDECTOMY    . COLONOSCOPY WITH ESOPHAGOGASTRODUODENOSCOPY (EGD)     . RECTAL SURGERY     x3 ABSCESS   Family History  Problem Relation Age of Onset  . Heart attack Mother   . Heart attack Father    Social History   Tobacco Use  . Smoking status: Former Smoker    Packs/day: 2.00    Years: 50.00    Pack years: 100.00    Types: Cigarettes    Quit date: 09/24/1989    Years since quitting: 31.0  . Smokeless tobacco: Never Used  Vaping Use  . Vaping Use: Never used  Substance Use Topics  . Alcohol use: Yes    Alcohol/week: 0.0 standard drinks    Comment: OCC RUM AND COKE  . Drug use: No    Pertinent Clinical Results:  LABS: Labs reviewed: Acceptable for surgery.  No visits with results within 3 Day(s) from this visit.  Latest known visit with results is:  Hospital Outpatient Visit on 09/28/2020  Component Date Value Ref Range Status  . WBC 09/28/2020 8.6  4.0 - 10.5 K/uL Final  . RBC 09/28/2020 3.14* 4.22 - 5.81 MIL/uL Final  . Hemoglobin 09/28/2020 10.2* 13.0 - 17.0 g/dL Final  . HCT 09/28/2020 31.8* 39.0 - 52.0 % Final  . MCV 09/28/2020 101.3* 80.0 - 100.0 fL Final  . MCH 09/28/2020 32.5  26.0 - 34.0 pg Final  . MCHC 09/28/2020 32.1  30.0 - 36.0 g/dL Final  . RDW 09/28/2020 13.7  11.5 - 15.5 % Final  . Platelets 09/28/2020 169  150 - 400 K/uL Final  . nRBC 09/28/2020 0.0  0.0 - 0.2 % Final   Performed at [facility], [address] [place], [place] [address]  . Sodium 09/28/2020 139  135 - 145 mmol/L Final  . Potassium 09/28/2020 3.7  3.5 - 5.1 mmol/L Final  . Chloride 09/28/2020 104  98 - 111 mmol/L Final  . CO2 09/28/2020 25  22 - 32 mmol/L Final  . Glucose, Bld 09/28/2020 151* 70 - 99 mg/dL Final   Glucose reference range applies only to samples taken after fasting for at least 8 hours.  . BUN 09/28/2020 24* 8 - 23 mg/dL Final  . Creatinine, Ser 09/28/2020 1.30* 0.61 - 1.24 mg/dL Final  . Calcium 09/28/2020 9.2  8.9 - 10.3 mg/dL Final  . GFR, Estimated 09/28/2020 52* >60 mL/min Final   Comment: (NOTE) Calculated  using the CKD-EPI Creatinine Equation (2021)   . Anion gap 09/28/2020 10  5 - 15 Final   Performed at [facility], [address] [place], [place] [address]    ECG: Date: 09/28/2020 Time ECG obtained: 1344 PM Rate: 77 bpm Rhythm:  Sinus rhythm with first-degree AV block; PACs Axis (leads I and aVF): Left axis deviation Intervals: PR 208 ms. QRS 144 ms. QTc 454 ms. ST segment and T wave changes: No evidence of acute ST segment elevation or depression Comparison: Similar to previous tracing obtained on 09/29/2012   IMAGING / PROCEDURES: PULMONARY FUNCTION TESTING performed on 12/25/2019 1. Parameters consistent with moderate obstruction.  No significant bronchodilator effect  SPIROMETRY: FVC was 2.96 liters, 90% of predicted/Post 3.05, 93%, 3% Change FEV1 was 1.89, 77% of predicted/Post 2.03, 82%, 8% Change FEV1 ratio was 64/Post 67 FEF 25-75% liters per second was 32% of predicted/Post 39%, 24% Change *Patient took Personal Combivent Inhaler for Post Spirometry 2. Lung volumes are within normal range  LUNG VOLUMES: TLC was 85% of predicted RV was 75% of predicted 3. DLCO was within normal range  DIFFUSION CAPACITY: DLCO was 88% of predicted DLCO/VA was 97% of predicted 4. Findings consistent with obstruction indicative of mild COPD  FLOW VOLUME LOOP: Expiratory flow volume loop is delayed, c/w obstruction   ECHOCARDIOGRAM performed on 07/06/2018 1. Moderately reduced left ventricular systolic function with an EF of 35-40% 2. Left ventricular cavity size normal 3. Moderate concentric left ventricular hypertrophy 4. Severe hypokinesis of the anteroseptal and anterior myocardium 5. Doppler parameters consistent with abnormal left ventricular relaxation; G1DD 6. Septal motion showed dyssynergy consistent with LBBB 7. Calcified mitral valve annulus with moderate regurgitation 8. Mildly dilated left atrium 9. PA pressure could not be accurately  estimated 10. Trivial posterior pericardial effusion identified  Impression and Plan:  Chayden A [name]. has been referred for pre-anesthesia review and clearance prior to him undergoing the planned anesthetic and procedural courses. Available labs, pertinent testing, and imaging results were personally reviewed by me. This patient has been appropriately cleared by cardiology with an overall LOW risk for significant perioperative cardiovascular complications.   Based on clinical review performed today (10/01/20), barring any significant acute changes in the patient's overall condition, it is anticipated that he will be able to proceed with the planned surgical intervention. Any acute changes in clinical condition may necessitate his procedure being postponed and/or cancelled. Pre-surgical instructions were reviewed with the patient during his PAT appointment and questions were fielded by PAT clinical staff.  Honor Loh, MSN, APRN, FNP-C, CEN [facility]  Peri-operative Services Nurse Practitioner Phone: [phone] 10/01/20 2:01 PM  NOTE: This note has been prepared using Dragon dictation software. Despite my best ability to proofread, there is always the potential that unintentional transcriptional errors may still occur from this process.

## 2020-10-01 ENCOUNTER — Other Ambulatory Visit: Payer: Self-pay | Admitting: Radiology

## 2020-10-01 ENCOUNTER — Other Ambulatory Visit
Admission: RE | Admit: 2020-10-01 | Discharge: 2020-10-01 | Disposition: A | Discharge status: Home / Self Care | Payer: Medicare Other | Source: Ambulatory Visit | Attending: Urology | Admitting: Urology

## 2020-10-01 ENCOUNTER — Other Ambulatory Visit: Payer: Self-pay

## 2020-10-01 DIAGNOSIS — N138 Other obstructive and reflux uropathy: Secondary | ICD-10-CM

## 2020-10-01 DIAGNOSIS — Z20822 Contact with and (suspected) exposure to covid-19: Secondary | ICD-10-CM | POA: Diagnosis not present

## 2020-10-01 DIAGNOSIS — N390 Urinary tract infection, site not specified: Secondary | ICD-10-CM

## 2020-10-01 DIAGNOSIS — R339 Retention of urine, unspecified: Secondary | ICD-10-CM

## 2020-10-01 DIAGNOSIS — Z01812 Encounter for preprocedural laboratory examination: Secondary | ICD-10-CM | POA: Insufficient documentation

## 2020-10-01 LAB — SARS CORONAVIRUS 2 (TAT 6-24 HRS): SARS Coronavirus 2: NEGATIVE

## 2020-10-01 MED ORDER — CIPROFLOXACIN HCL 250 MG PO TABS: 250.0000 mg | ORAL_TABLET | Freq: Two times a day (BID) | ORAL | 0 refills | Status: DC

## 2020-10-02 ENCOUNTER — Other Ambulatory Visit: Payer: Medicare Other

## 2020-10-04 LAB — CULTURE, URINE COMPREHENSIVE

## 2020-10-05 ENCOUNTER — Telehealth: Payer: Self-pay | Admitting: Radiology

## 2020-10-05 NOTE — Telephone Encounter (Signed)
-----   Message from Riki Altes, MD sent at 10/05/2020 10:12 AM EST ----- Urine culture was positive for Pseudomonas and sensitive to Cipro.  Please verify that he has been taking his antibiotic.  Thanks

## 2020-10-05 NOTE — Telephone Encounter (Signed)
Daughter confirmed patient has been taking antibiotic as prescribed.

## 2020-10-06 ENCOUNTER — Other Ambulatory Visit: Payer: Self-pay

## 2020-10-06 ENCOUNTER — Ambulatory Visit: Payer: No Typology Code available for payment source | Admitting: Urgent Care

## 2020-10-06 ENCOUNTER — Encounter
Admission: RE | Disposition: A | Discharge status: Home / Self Care | Payer: Self-pay | Source: Home / Self Care | Attending: Urology

## 2020-10-06 ENCOUNTER — Encounter: Payer: Self-pay | Admitting: Urology

## 2020-10-06 ENCOUNTER — Ambulatory Visit
Admission: RE | Admit: 2020-10-06 | Discharge: 2020-10-06 | Disposition: A | Discharge status: Home / Self Care | Payer: No Typology Code available for payment source | Attending: Urology | Admitting: Urology

## 2020-10-06 DIAGNOSIS — R3915 Urgency of urination: Secondary | ICD-10-CM | POA: Diagnosis not present

## 2020-10-06 DIAGNOSIS — R338 Other retention of urine: Secondary | ICD-10-CM | POA: Insufficient documentation

## 2020-10-06 DIAGNOSIS — Z87440 Personal history of urinary (tract) infections: Secondary | ICD-10-CM | POA: Insufficient documentation

## 2020-10-06 DIAGNOSIS — N138 Other obstructive and reflux uropathy: Secondary | ICD-10-CM

## 2020-10-06 DIAGNOSIS — R3914 Feeling of incomplete bladder emptying: Secondary | ICD-10-CM | POA: Diagnosis not present

## 2020-10-06 DIAGNOSIS — R3911 Hesitancy of micturition: Secondary | ICD-10-CM | POA: Insufficient documentation

## 2020-10-06 DIAGNOSIS — N401 Enlarged prostate with lower urinary tract symptoms: Secondary | ICD-10-CM | POA: Diagnosis not present

## 2020-10-06 DIAGNOSIS — R339 Retention of urine, unspecified: Secondary | ICD-10-CM

## 2020-10-06 HISTORY — DX: Chronic kidney disease, stage 3 unspecified: N18.30

## 2020-10-06 HISTORY — DX: Personal history of other diseases of the respiratory system: Z87.09

## 2020-10-06 HISTORY — DX: Hyperlipidemia, unspecified: E78.5

## 2020-10-06 HISTORY — PX: CYSTOSCOPY WITH INSERTION OF UROLIFT: SHX6678

## 2020-10-06 SURGERY — CYSTOSCOPY WITH INSERTION OF UROLIFT
Anesthesia: General

## 2020-10-06 MED ORDER — PROPOFOL 500 MG/50ML IV EMUL
INTRAVENOUS | Status: DC | PRN
  Administered 2020-10-06: 50 ug/kg/min via INTRAVENOUS

## 2020-10-06 MED ORDER — CHLORHEXIDINE GLUCONATE 0.12 % MT SOLN
OROMUCOSAL | Status: AC
  Administered 2020-10-06: 15 mL via OROMUCOSAL
  Filled 2020-10-06: qty 15

## 2020-10-06 MED ORDER — ONDANSETRON 4 MG PO TBDP: 4.0000 mg | ORAL_TABLET | Freq: Three times a day (TID) | ORAL | 0 refills | Status: DC | PRN

## 2020-10-06 MED ORDER — ORAL CARE MOUTH RINSE: 15.0000 mL | Freq: Once | OROMUCOSAL | Status: AC

## 2020-10-06 MED ORDER — FENTANYL CITRATE (PF) 100 MCG/2ML IJ SOLN
25.0000 ug | INTRAMUSCULAR | Status: DC | PRN
  Administered 2020-10-06 (×2): 50 ug via INTRAVENOUS

## 2020-10-06 MED ORDER — FENTANYL CITRATE (PF) 100 MCG/2ML IJ SOLN
INTRAMUSCULAR | Status: AC
  Filled 2020-10-06: qty 2

## 2020-10-06 MED ORDER — SODIUM CHLORIDE 0.9 % IV SOLN
1.0000 g | Freq: Once | INTRAVENOUS | Status: AC
  Administered 2020-10-06: 1 g via INTRAVENOUS
  Filled 2020-10-06: qty 1

## 2020-10-06 MED ORDER — LACTATED RINGERS IV SOLN: INTRAVENOUS | Status: DC

## 2020-10-06 MED ORDER — LIDOCAINE HCL 2 % EX GEL
CUTANEOUS | Status: DC | PRN
  Administered 2020-10-06: 1

## 2020-10-06 MED ORDER — SODIUM CHLORIDE 0.9 % IV SOLN: INTRAVENOUS | Status: DC | PRN

## 2020-10-06 MED ORDER — OXYCODONE HCL 5 MG/5ML PO SOLN: 5.0000 mg | Freq: Once | ORAL | Status: AC | PRN

## 2020-10-06 MED ORDER — PROPOFOL 10 MG/ML IV BOLUS
INTRAVENOUS | Status: DC | PRN
  Administered 2020-10-06: 20 mg via INTRAVENOUS
  Administered 2020-10-06: 10 mg via INTRAVENOUS
  Administered 2020-10-06 (×2): 20 mg via INTRAVENOUS

## 2020-10-06 MED ORDER — FENTANYL CITRATE (PF) 100 MCG/2ML IJ SOLN
INTRAMUSCULAR | Status: AC
  Administered 2020-10-06: 50 ug via INTRAVENOUS
  Filled 2020-10-06: qty 2

## 2020-10-06 MED ORDER — OXYCODONE HCL 5 MG PO TABS
ORAL_TABLET | ORAL | Status: AC
  Administered 2020-10-06: 5 mg via ORAL
  Filled 2020-10-06: qty 1

## 2020-10-06 MED ORDER — CHLORHEXIDINE GLUCONATE 0.12 % MT SOLN: 15.0000 mL | Freq: Once | OROMUCOSAL | Status: AC

## 2020-10-06 MED ORDER — OXYCODONE HCL 5 MG PO TABS: 5.0000 mg | ORAL_TABLET | Freq: Once | ORAL | Status: AC | PRN

## 2020-10-06 SURGICAL SUPPLY — 16 items
BAG DRAIN CYSTO-URO LG1000N (MISCELLANEOUS) ×1 IMPLANT
BRUSH SCRUB EZ  4% CHG (MISCELLANEOUS) ×2
BRUSH SCRUB EZ 4% CHG (MISCELLANEOUS) ×1 IMPLANT
GLOVE BIOGEL PI IND STRL 7.5 (GLOVE) ×1 IMPLANT
GLOVE BIOGEL PI INDICATOR 7.5 (GLOVE) ×1
GOWN STRL REUS W/ TWL LRG LVL3 (GOWN DISPOSABLE) ×1 IMPLANT
GOWN STRL REUS W/ TWL XL LVL3 (GOWN DISPOSABLE) ×1 IMPLANT
GOWN STRL REUS W/TWL LRG LVL3 (GOWN DISPOSABLE) ×2
GOWN STRL REUS W/TWL XL LVL3 (GOWN DISPOSABLE) ×2
KIT TURNOVER CYSTO (KITS) ×2 IMPLANT
PACK CYSTO AR (MISCELLANEOUS) ×2 IMPLANT
SET CYSTO W/LG BORE CLAMP LF (SET/KITS/TRAYS/PACK) ×2 IMPLANT
SURGILUBE 2OZ TUBE FLIPTOP (MISCELLANEOUS) IMPLANT
SYSTEM UROLIFT (Male Continence) ×6 IMPLANT
WATER STERILE IRR 1000ML POUR (IV SOLUTION) ×2 IMPLANT
WATER STERILE IRR 3000ML UROMA (IV SOLUTION) ×2 IMPLANT

## 2020-10-06 NOTE — Discharge Instructions (Signed)
Urolift Post-Operative Instructions     Patient Expectations   1. Mild blood in your urine for about 1 week.  2. Urinary buring, frequency, and urgency for 10-14 days.  3. Mild pelvic pain 1-2 weeks.     Return to Activity     1. Drink water post procedure.  2. Take meds as needed.  Tylenol and/or Motrin is most helpful.  You may also by Pyridium/Azo over-the-counter for urinary burning.  Continue taking any prostate medications until your first postoperative visit.   3. No lifting or straining 48hrs.  4. Other activity when you feel up to it.  5.  Call our office [phone] for any questions or concerns.   AMBULATORY SURGERY  DISCHARGE INSTRUCTIONS   1) The drugs that you were given will stay in your system until tomorrow so for the next 24 hours you should not:  A) Drive an automobile B) Make any legal decisions C) Drink any alcoholic beverage   2) You may resume regular meals tomorrow.  Today it is better to start with liquids and gradually work up to solid foods.  You may eat anything you prefer, but it is better to start with liquids, then soup and crackers, and gradually work up to solid foods.   3) Please notify your doctor immediately if you have any unusual bleeding, trouble breathing, redness and pain at the surgery site, drainage, fever, or pain not relieved by medication.    4) Additional Instructions:        Please contact your physician with any problems or Same Day Surgery at [phone], Monday through Friday 6 am to 4 pm, or [place] at [facility] number at [phone].

## 2020-10-06 NOTE — Op Note (Signed)
Preoperative diagnosis:  1. BPH with LUTS 2. Incomplete bladder emptying 3. Recurrent UTI 4. Urinary frequency 5. Urinary urgency 6. Urinary hesitancy 7. Intermittent urinary stream  Postoperative diagnosis: Same  Procedure: Urolift (placement of 6 implants)  Surgeon: Bernardo Heater  Anesthesia: MAC  Complications: None  Drains: None  Estimated blood loss: < 5 mL  Indications: Lonnie [name] is a 87 y.o.male with obstructive symptomatology secondary to BPH.  Previous urodynamic study showed outlet obstruction and a hyposensitive bladder.  The patient's symptoms have progressed, and he has requested further management.  Management options including TURP with resection/ablation of the prostate as well as Urolift were discussed.  The patient has chosen to have a Urolift procedure.  He has been instructed to the procedure as well as risks and complications which include but are not limited to infection, bleeding, and inadequate treatment with the Urolift procedure alone, anesthetic complications, among others.  He understands these and desires to proceed.  Findings: Using the 17 French cystoscope, urethra and bladder were inspected.  There were no urethral lesions.  Prostatic urethra showed moderate lateral lobe enlargement without an obstructive median lobe.  The bladder was inspected circumferentially.  This revealed marked trabeculation but no lesions  Description of procedure: The patient was properly identified in the holding area.  He received preoperative IV antibiotics.  He was taken to the operating room where sedation was obtained by anesthesia.  He was placed in the dorsolithotomy position.  Genitalia and perineum were prepped and draped.  Proper timeout was performed.  Lidocaine gel was then instilled per urethra  A 59F cystoscope was inserted into the bladder. The cystoscopy bridge was replaced with a UroLift delivery device.  The 1st pair of implants were placed 1.5-2 cm from the  bladder neck.  The 2nd pair of implants were placed just proximal to the verumontanum. The The delivery device was then replaced with cystoscope and bridge and the implant location and opening effect was confirmed cystoscopically.  An additional pair of implants was placed between the prior pairs.  A final cystoscopy was conducted first to inspect the location and state of each implant and second, to confirm the presence of a continuous anterior channel was present through the prostatic urethra with irrigation flow turned off.  No injury to the bladder or urethra was detected. Six implants were delivered in total.   He tolerated procedure well and was transported to the PACU in stable condition.  Plan:  Will be discharged without an indwelling catheter  Follow-up visit 1 month   Lonnie [name]. MD

## 2020-10-06 NOTE — H&P (Signed)
09/06/20  Chief Complaint  Patient presents with  . Cysto     HPI: 87-year-old male with refractory urinary symptoms related to BPH who presents today to the office for cystoscopy and prostate sizing   Please see previous notes for details.     Blood pressure (!) 148/74, pulse 82, height 5' 8" (1.727 m), weight 187 lb (84.8 kg). NED. A&Ox3.   No respiratory distress   Abd soft, NT, ND Normal phallus with bilateral descended testicles    Cystoscopy Procedure Note  Patient identification was confirmed, informed consent was obtained, and patient was prepped using Betadine solution.  Lidocaine jelly was administered per urethral meatus.    Preoperative abx where received prior to procedure.     Pre-Procedure: - Inspection reveals a normal caliber urethral meatus.  Procedure: The flexible cystoscope was introduced without difficulty - No urethral strictures/lesions are present. - Mild lateral lobe enlargement prostate  - Mild elevation bladder neck - Bilateral ureteral orifices identified - Bladder mucosa  reveals no ulcers, tumors, or lesions - No bladder stones - No trabeculation  Retroflexion shows no abnormalities   Post-Procedure: - Patient tolerated the procedure well  Assessment/ Plan:  Mild lateral lobe prostate enlargement   Prostate transrectal ultrasound sizing   Informed consent was obtained after discussing risks/benefits of the procedure.  A time out was performed to ensure correct patient identity.   Pre-Procedure: -Transrectal probe was placed without difficulty -Transrectal Ultrasound performed revealing a  27 gm prostate measuring 3.9 x 4.6 x 3.9 cm (length) -No significant hypoechoic or median lobe noted      Assessment/ Plan:  Mild prostate enlargement however prior urodynamic study did not show obstruction  He does have incomplete bladder emptying and hypersensitive bladder was also noted on urodynamics  Discussed with patient and his  daughter that any outlet procedure including UroLift or TURP may not improve his symptoms or bladder emptying  He is interested in scheduling UroLift as opposed to other procedures due to the minimally invasive nature ability to perform under sedation  The procedure was discussed in detail occluding most common side effects of irritative voiding symptoms, hematuria and dysuria  He indicated all questions were answered and desires to proceed    Virginia Curl C Umaima Scholten, MD      

## 2020-10-06 NOTE — Anesthesia Postprocedure Evaluation (Signed)
Anesthesia Post Note  Patient: Lonnie [name].  Procedure(s) Performed: CYSTOSCOPY WITH INSERTION OF UROLIFT (N/A )  Patient location during evaluation: PACU Anesthesia Type: General Level of consciousness: awake and alert Pain management: pain level controlled Vital Signs Assessment: post-procedure vital signs reviewed and stable Respiratory status: spontaneous breathing, nonlabored ventilation and respiratory function stable Cardiovascular status: blood pressure returned to baseline and stable Postop Assessment: no apparent nausea or vomiting Anesthetic complications: no   No complications documented.   Last Vitals:  Vitals:   10/06/20 1505 10/06/20 1514  BP: (!) 162/77   Pulse: 68 69  Resp: 13 10  Temp:    SpO2: 97% 91%    Last Pain:  Vitals:   10/06/20 1514  TempSrc:   PainSc: 4                  Tanishka Drolet Garry Heater

## 2020-10-06 NOTE — Anesthesia Procedure Notes (Signed)
Date/Time: 10/06/2020 2:02 PM Performed by: Junious Silk, CRNA Pre-anesthesia Checklist: Patient identified, Emergency Drugs available, Suction available, Patient being monitored and Timeout performed Oxygen Delivery Method: Simple face mask

## 2020-10-06 NOTE — Interval H&P Note (Signed)
History and Physical Interval Note: CV: RRR Lungs: Clear  10/06/2020 1:40 PM  Lonnie [name] Ee.  has presented today for surgery, with the diagnosis of benign prostatic hypertrophy with incomplete bladder emptying.  The various methods of treatment have been discussed with the patient and family. After consideration of risks, benefits and other options for treatment, the patient has consented to  Procedure(s): CYSTOSCOPY WITH INSERTION OF UROLIFT (N/A) as a surgical intervention.  The patient's history has been reviewed, patient examined, no change in status, stable for surgery.  I have reviewed the patient's chart and labs.  Questions were answered to the patient's satisfaction.     Genisis Sonnier C Lj Miyamoto

## 2020-10-06 NOTE — Anesthesia Preprocedure Evaluation (Signed)
Anesthesia Evaluation  Patient identified by MRN, date of birth, ID band Patient awake and Patient confused    Reviewed: Allergy & Precautions, H&P , NPO status , Patient's Chart, lab work & pertinent test results  History of Anesthesia Complications Negative for: history of anesthetic complications  Airway Mallampati: III  TM Distance: >3 FB Neck ROM: full    Dental  (+) Chipped   Pulmonary shortness of breath, pneumonia, COPD, former smoker,    Pulmonary exam normal        Cardiovascular Exercise Tolerance: Good hypertension, + CAD  Normal cardiovascular exam+ dysrhythmias      Neuro/Psych  Neuromuscular disease negative psych ROS   GI/Hepatic negative GI ROS, Neg liver ROS, GERD  ,  Endo/Other  negative endocrine ROS  Renal/GU Renal disease  negative genitourinary   Musculoskeletal   Abdominal   Peds  Hematology negative hematology ROS (+)   Anesthesia Other Findings Past Medical History: No date: Anemia 11/13/2009: Basal cell carcinoma     Comment:  Right alar groove 04/28/2010: Basal cell carcinoma     Comment:  left preauricular, BCC with focal sclerosis. Exc.               06/04/2010 04/24/2013: Basal cell carcinoma     Comment:  right post auricular 10/26/2017: Basal cell carcinoma     Comment:  right inferior cheek above mandible, Superficial 03/22/2018: Basal cell carcinoma     Comment:  right post. base of neck 05/08/2018: Basal cell carcinoma     Comment:  post. neck, spinal, Superficial. EDC 05/08/2018 07/23/2018: Basal cell carcinoma     Comment:  left mid supraclavicular, Superficial and nodular               pattern. EDC 07/23/2018: Basal cell carcinoma     Comment:  right superior chest parasternal. Superficial and               nodular pattern. EDC 05/16/2019: Basal cell carcinoma     Comment:  left lateral neck. Superficial and nodular pattern. EDC 11/07/2019: Basal cell carcinoma      Comment:  left lateral forehead. Nodular. EDC 12/23/2019: Basal cell carcinoma     Comment:  Left ala nasi. BCC with sclerosis. No date: Bundle branch block, left No date: CKD (chronic kidney disease) stage 3, GFR 30-59 ml/min (HCC) No date: COPD (chronic obstructive pulmonary disease) (HCC) No date: Coronary artery disease No date: Family history of adverse reaction to anesthesia     Comment:  DAUGHTER-N/V No date: GERD (gastroesophageal reflux disease) No date: History of asbestosis No date: HLD (hyperlipidemia) No date: Hypertension 2017: Pneumonia No date: PSA elevation  Past Surgical History: No date: APPENDECTOMY No date: COLONOSCOPY WITH ESOPHAGOGASTRODUODENOSCOPY (EGD) No date: RECTAL SURGERY     Comment:  x3 ABSCESS  BMI    Body Mass Index: 28.43 kg/m      Reproductive/Obstetrics negative OB ROS                             Anesthesia Physical Anesthesia Plan  ASA: III  Anesthesia Plan: General   Post-op Pain Management:    Induction: Intravenous  PONV Risk Score and Plan: Propofol infusion and TIVA  Airway Management Planned: Natural Airway and Nasal Cannula  Additional Equipment:   Intra-op Plan:   Post-operative Plan:   Informed Consent: I have reviewed the patients History and Physical, chart, labs and discussed the procedure including the risks,  benefits and alternatives for the proposed anesthesia with the patient or authorized representative who has indicated his/her understanding and acceptance.     Dental Advisory Given  Plan Discussed with: Anesthesiologist, CRNA and Surgeon  Anesthesia Plan Comments: (Patient and daughter consented for risks of anesthesia including but not limited to:  - adverse reactions to medications - risk of airway placement if required - damage to eyes, teeth, lips or other oral mucosa - nerve damage due to positioning  - sore throat or hoarseness - Damage to heart, brain, nerves, lungs,  other parts of body or loss of life  They voiced understanding.)        Anesthesia Quick Evaluation

## 2020-10-06 NOTE — Transfer of Care (Signed)
Immediate Anesthesia Transfer of Care Note  Patient: Lonnie [name].  Procedure(s) Performed: CYSTOSCOPY WITH INSERTION OF UROLIFT (N/A )  Patient Location: PACU  Anesthesia Type:General  Level of Consciousness: awake, alert  and oriented  Airway & Oxygen Therapy: Patient Spontanous Breathing and Patient connected to face mask oxygen  Post-op Assessment: Report given to RN and Post -op Vital signs reviewed and stable  Post vital signs: Reviewed and stable  Last Vitals:  Vitals Value Taken Time  BP 147/66 10/06/20 1436  Temp 36.1 C 10/06/20 1436  Pulse 73 10/06/20 1442  Resp 16 10/06/20 1442  SpO2 95 % 10/06/20 1442  Vitals shown include unvalidated device data.  Last Pain:  Vitals:   10/06/20 1436  TempSrc:   PainSc: 0-No pain         Complications: No complications documented.

## 2020-10-07 ENCOUNTER — Encounter: Payer: Self-pay | Admitting: Urology

## 2020-11-09 ENCOUNTER — Encounter: Payer: Self-pay | Admitting: Urology

## 2020-11-09 ENCOUNTER — Other Ambulatory Visit: Payer: Self-pay

## 2020-11-09 ENCOUNTER — Ambulatory Visit (INDEPENDENT_AMBULATORY_CARE_PROVIDER_SITE_OTHER): Payer: No Typology Code available for payment source | Admitting: Urology

## 2020-11-09 VITALS — BP 123/69 | HR 87 | Ht 67.0 in | Wt 184.0 lb

## 2020-11-09 DIAGNOSIS — N138 Other obstructive and reflux uropathy: Secondary | ICD-10-CM

## 2020-11-09 DIAGNOSIS — N401 Enlarged prostate with lower urinary tract symptoms: Secondary | ICD-10-CM | POA: Diagnosis not present

## 2020-11-09 LAB — BLADDER SCAN AMB NON-IMAGING: Scan Result: 126

## 2020-11-09 NOTE — Progress Notes (Signed)
11/09/2020 1:18 PM   Lonnie [name]. 1926/10/31 [id number]  Referring provider: Derinda Late, MD [phone] [facility] [place],  [place] [address]  Chief Complaint  Patient presents with  . Benign Prostatic Hypertrophy    HPI: 87 y.o. male presents for postop follow-up   UroLift 10/06/2020  Preop LUTS and residual 300-600 mL  Feels he is voiding with a better stream  Some residual frequency and urgency  IPSS 20/35   PMH: Past Medical History:  Diagnosis Date  . Anemia   . Basal cell carcinoma 11/13/2009   Right alar groove  . Basal cell carcinoma 04/28/2010   left preauricular, BCC with focal sclerosis. Exc. 06/04/2010  . Basal cell carcinoma 04/24/2013   right post auricular  . Basal cell carcinoma 10/26/2017   right inferior cheek above mandible, Superficial  . Basal cell carcinoma 03/22/2018   right post. base of neck  . Basal cell carcinoma 05/08/2018   post. neck, spinal, Superficial. EDC 05/08/2018  . Basal cell carcinoma 07/23/2018   left mid supraclavicular, Superficial and nodular pattern. EDC  . Basal cell carcinoma 07/23/2018   right superior chest parasternal. Superficial and nodular pattern. EDC  . Basal cell carcinoma 05/16/2019   left lateral neck. Superficial and nodular pattern. EDC  . Basal cell carcinoma 11/07/2019   left lateral forehead. Nodular. EDC  . Basal cell carcinoma 12/23/2019   Left ala nasi. BCC with sclerosis.  . Bundle branch block, left   . CKD (chronic kidney disease) stage 3, GFR 30-59 ml/min (HCC)   . COPD (chronic obstructive pulmonary disease) ([facility] [place])   . Coronary artery disease   . Family history of adverse reaction to anesthesia    DAUGHTER-N/V  . GERD (gastroesophageal reflux disease)   . History of asbestosis   . HLD (hyperlipidemia)   . Hypertension   . Pneumonia 2017  . PSA elevation     Surgical History: Past Surgical History:  Procedure Laterality Date  .  APPENDECTOMY    . COLONOSCOPY WITH ESOPHAGOGASTRODUODENOSCOPY (EGD)    . CYSTOSCOPY WITH INSERTION OF UROLIFT N/A 10/06/2020   Procedure: CYSTOSCOPY WITH INSERTION OF UROLIFT;  Surgeon: Abbie Sons, MD;  Location: [facility] ORS;  Service: Urology;  Laterality: N/A;  . RECTAL SURGERY     x3 ABSCESS    Home Medications:  Allergies as of 11/09/2020      Reactions   Sulfa Antibiotics Other (See Comments)   Not sure of reaction. It was too long ago, but he said not to give it to him.    Sulfasalazine    Other reaction(s): Other (See Comments) Not sure of reaction. It was too long ago, but he said not to give it to him.    Penicillin G Rash      Medication List       Accurate as of November 09, 2020  1:18 PM. If you have any questions, ask your nurse or doctor.        STOP taking these medications   ciprofloxacin 250 MG tablet Commonly known as: CIPRO Stopped by: Abbie Sons, MD     TAKE these medications   alfuzosin 10 MG 24 hr tablet Commonly known as: UROXATRAL Take 10 mg by mouth daily with breakfast.   allopurinol 300 MG tablet Commonly known as: ZYLOPRIM Take 300 mg by mouth every morning.   aspirin 81 MG tablet Take 81 mg by mouth daily.   atorvastatin 40  MG tablet Commonly known as: LIPITOR Take 40 mg by mouth every morning.   budesonide-formoterol 160-4.5 MCG/ACT inhaler Commonly known as: SYMBICORT Inhale 2 puffs into the lungs 2 (two) times daily.   Combivent Respimat 20-100 MCG/ACT Aers respimat Generic drug: Ipratropium-Albuterol Inhale 1 puff into the lungs every morning.   ferrous sulfate 325 (65 FE) MG tablet Take 325 mg by mouth 2 (two) times daily with a meal.   finasteride 5 MG tablet Commonly known as: PROSCAR Take 5 mg by mouth every morning.   gabapentin 300 MG capsule Commonly known as: NEURONTIN Take 600 mg by mouth 2 (two) times daily.   lisinopril 40 MG tablet Commonly known as: ZESTRIL Take 40 mg by mouth daily.   Magnesium  Oxide 250 MG Tabs Take 500 mg by mouth every morning.   melatonin 5 MG Tabs Take 5 mg by mouth at bedtime.   multivitamin with minerals tablet Take 1 tablet by mouth daily.   NIFEdipine 30 MG 24 hr tablet Commonly known as: PROCARDIA-XL/NIFEDICAL-XL Take 30 mg by mouth every morning.   Omega-3 1000 MG Caps Take 1,000 mg by mouth daily.   omeprazole 20 MG capsule Commonly known as: PRILOSEC Take 20 mg by mouth 2 (two) times daily.   ondansetron 4 MG disintegrating tablet Commonly known as: Zofran ODT Take 1 tablet (4 mg total) by mouth every 8 (eight) hours as needed for nausea or vomiting.   sennosides-docusate sodium 8.6-50 MG tablet Commonly known as: SENOKOT-S Take 2 tablets by mouth daily.   vitamin B-12 1000 MCG tablet Commonly known as: CYANOCOBALAMIN Take 1,000 mcg by mouth daily.   vitamin C 250 MG tablet Commonly known as: ASCORBIC ACID Take 250 mg by mouth 2 (two) times daily.       Allergies:  Allergies  Allergen Reactions  . Sulfa Antibiotics Other (See Comments)    Not sure of reaction. It was too long ago, but he said not to give it to him.   . Sulfasalazine     Other reaction(s): Other (See Comments) Not sure of reaction. It was too long ago, but he said not to give it to him.   Marland Kitchen Penicillin G Rash    Family History: Family History  Problem Relation Age of Onset  . Heart attack Mother   . Heart attack Father     Social History:  reports that he quit smoking about 31 years ago. His smoking use included cigarettes. He has a 100.00 pack-year smoking history. He has never used smokeless tobacco. He reports current alcohol use. He reports that he does not use drugs.   Physical Exam: BP 123/69   Pulse 87   Ht 5\' 7"  (1.702 m)   Wt 184 lb (83.5 kg)   BMI 28.82 kg/m   Constitutional:  Alert and oriented, No acute distress.   Assessment & Plan:    1. BPH with obstruction/lower urinary tract symptoms  Bladder emptying improved status post  UroLift; bladder scan PVR today 126 mL  Feels he is voiding with a better stream  Follow-up 3 months with repeat bladder scan/IPSS   Abbie Sons, MD  [place] [address] [place],  [address] [phone]

## 2020-11-19 ENCOUNTER — Ambulatory Visit (INDEPENDENT_AMBULATORY_CARE_PROVIDER_SITE_OTHER): Payer: No Typology Code available for payment source | Admitting: Dermatology

## 2020-11-19 ENCOUNTER — Other Ambulatory Visit: Payer: Self-pay

## 2020-11-19 ENCOUNTER — Other Ambulatory Visit: Payer: Self-pay | Admitting: Dermatology

## 2020-11-19 DIAGNOSIS — L570 Actinic keratosis: Secondary | ICD-10-CM | POA: Diagnosis not present

## 2020-11-19 DIAGNOSIS — L578 Other skin changes due to chronic exposure to nonionizing radiation: Secondary | ICD-10-CM

## 2020-11-19 DIAGNOSIS — L821 Other seborrheic keratosis: Secondary | ICD-10-CM

## 2020-11-19 DIAGNOSIS — C44219 Basal cell carcinoma of skin of left ear and external auricular canal: Secondary | ICD-10-CM

## 2020-11-19 DIAGNOSIS — D485 Neoplasm of uncertain behavior of skin: Secondary | ICD-10-CM

## 2020-11-19 DIAGNOSIS — L820 Inflamed seborrheic keratosis: Secondary | ICD-10-CM

## 2020-11-19 NOTE — Patient Instructions (Signed)

## 2020-11-19 NOTE — Progress Notes (Signed)
Follow-Up Visit   Subjective  Lonnie [name]. is a 87 y.o. male who presents for the following: Lesion (L post auricular - has been there for a couple of months.). He has also noticed other irregular irritated skin lesions on the back, legs, arms, and abdomen that he would like checked today.   The following portions of the chart were reviewed this encounter and updated as appropriate:   Tobacco  Allergies  Meds  Problems  Med Hx  Surg Hx  Fam Hx     Review of Systems:  No other skin or systemic complaints except as noted in HPI or Assessment and Plan.  Objective  Well appearing patient in no apparent distress; mood and affect are within normal limits.  A focused examination was performed including the face and ears. Relevant physical exam findings are noted in the Assessment and Plan.  Objective  L ear post aspect: 1.2 cm keratotic papule   Objective  Left Ear: Erythematous thin papules/macules with gritty scale.   Objective  trunk, legs (19): Erythematous keratotic or waxy stuck-on papule or plaque.   Assessment & Plan  Neoplasm of uncertain behavior of skin L ear post aspect  Skin / nail biopsy Type of biopsy: tangential   Informed consent: discussed and consent obtained   Timeout: patient name, date of birth, surgical site, and procedure verified   Procedure prep:  Patient was prepped and draped in usual sterile fashion Prep type:  Isopropyl alcohol Anesthesia: the lesion was anesthetized in a standard fashion   Anesthetic:  1% lidocaine w/ epinephrine 1-100,000 buffered w/ 8.4% NaHCO3 Instrument used: flexible razor blade   Hemostasis achieved with: pressure, aluminum chloride and electrodesiccation   Outcome: patient tolerated procedure well   Post-procedure details: sterile dressing applied and wound care instructions given   Dressing type: bandage and petrolatum    Specimen 1 - Surgical pathology Differential Diagnosis: D48.5 r/o SCC vs other   Check Margins: No 1.2 cm keratotic papule  AK (actinic keratosis) Left Ear  Destruction of lesion - Left Ear Complexity: simple   Destruction method: cryotherapy   Informed consent: discussed and consent obtained   Timeout:  patient name, date of birth, surgical site, and procedure verified Lesion destroyed using liquid nitrogen: Yes   Region frozen until ice ball extended beyond lesion: Yes   Outcome: patient tolerated procedure well with no complications   Post-procedure details: wound care instructions given    Inflamed seborrheic keratosis (19) trunk, legs  Destruction of lesion - trunk, legs Complexity: simple   Destruction method: cryotherapy   Informed consent: discussed and consent obtained   Timeout:  patient name, date of birth, surgical site, and procedure verified Lesion destroyed using liquid nitrogen: Yes   Region frozen until ice ball extended beyond lesion: Yes   Outcome: patient tolerated procedure well with no complications   Post-procedure details: wound care instructions given     Actinic Damage - chronic, secondary to cumulative UV radiation exposure/sun exposure over time - diffuse scaly erythematous macules with underlying dyspigmentation - Recommend daily broad spectrum sunscreen SPF 30+ to sun-exposed areas, reapply every 2 hours as needed.  - Call for new or changing lesions.  Seborrheic Keratoses - Stuck-on, waxy, tan-brown papules and plaques  - Discussed benign etiology and prognosis. - Observe - Call for any changes  Return for follow up - pending biopsy results.  Luther Redo, CMA, am acting as scribe for Sarina Ser, MD .  Documentation: I have  reviewed the above documentation for accuracy and completeness, and I agree with the above.  Sarina Ser, MD

## 2020-11-22 ENCOUNTER — Encounter: Payer: Self-pay | Admitting: Dermatology

## 2020-11-23 ENCOUNTER — Telehealth: Payer: Self-pay

## 2020-11-23 NOTE — Telephone Encounter (Signed)
Advised pt and his daughter of bx results and scheduled pt for surgery 12/01/20 at 10:30./sh

## 2020-11-23 NOTE — Telephone Encounter (Signed)
-----   Message from Ralene Bathe, MD sent at 11/22/2020  2:11 PM EST ----- Diagnosis Skin , left ear, post aspect BASAL CELL CARCINOMA WITH FOCAL SCLEROSIS, SEE DESCRIPTION  Cancer - BCC Schedule for surgery

## 2020-12-01 ENCOUNTER — Telehealth: Payer: Self-pay

## 2020-12-01 ENCOUNTER — Ambulatory Visit (INDEPENDENT_AMBULATORY_CARE_PROVIDER_SITE_OTHER): Payer: No Typology Code available for payment source | Admitting: Dermatology

## 2020-12-01 ENCOUNTER — Other Ambulatory Visit: Payer: Self-pay

## 2020-12-01 ENCOUNTER — Encounter: Payer: Self-pay | Admitting: Dermatology

## 2020-12-01 DIAGNOSIS — T1490XA Injury, unspecified, initial encounter: Secondary | ICD-10-CM

## 2020-12-01 DIAGNOSIS — C44219 Basal cell carcinoma of skin of left ear and external auricular canal: Secondary | ICD-10-CM

## 2020-12-01 DIAGNOSIS — L820 Inflamed seborrheic keratosis: Secondary | ICD-10-CM | POA: Diagnosis not present

## 2020-12-01 DIAGNOSIS — L570 Actinic keratosis: Secondary | ICD-10-CM

## 2020-12-01 DIAGNOSIS — L578 Other skin changes due to chronic exposure to nonionizing radiation: Secondary | ICD-10-CM | POA: Diagnosis not present

## 2020-12-01 NOTE — Patient Instructions (Signed)

## 2020-12-01 NOTE — Progress Notes (Signed)
Follow-Up Visit   Subjective  Lonnie [name]. is a 87 y.o. male who presents for the following: BCC bx proven (L ear post aspect, pt presents for excision) and check spots (L nose, L ankle).  The following portions of the chart were reviewed this encounter and updated as appropriate:   Tobacco  Allergies  Meds  Problems  Med Hx  Surg Hx  Fam Hx     Review of Systems:  No other skin or systemic complaints except as noted in HPI or Assessment and Plan.  Objective  Well appearing patient in no apparent distress; mood and affect are within normal limits.  A focused examination was performed including face, L ear, L leg. Relevant physical exam findings are noted in the Assessment and Plan.  Objective  L ear posterior aspect: Pink bx site  Objective  Left Ankle - Anterior: Open wound  Images    Objective  nasal tip x 1: Pink scaly macules   Objective  Left lat nasal alar rim x 1: Erythematous keratotic or waxy stuck-on papule or plaque.    Assessment & Plan  Basal cell carcinoma (BCC) of skin of left ear L ear posterior aspect Skin excision  Lesion length (cm):  2 Lesion width (cm):  2 Margin per side (cm):  0.1 Total excision diameter (cm):  2.2 Informed consent: discussed and consent obtained   Timeout: patient name, date of birth, surgical site, and procedure verified   Procedure prep:  Patient was prepped and draped in usual sterile fashion Prep type:  Isopropyl alcohol and povidone-iodine Anesthesia: the lesion was anesthetized in a standard fashion   Anesthetic:  1% lidocaine w/ epinephrine 1-100,000 buffered w/ 8.4% NaHCO3 (3.0cc) Instrument used comment:  15c blace Hemostasis achieved with: pressure   Hemostasis achieved with comment:  Electrocautery Outcome: patient tolerated procedure well with no complications   Post-procedure details: sterile dressing applied and wound care instructions given   Dressing type: bandage and pressure dressing  (Mupirocin)   Additional details:  Post txt defect 2.2cm 2ndary intention healing  Specimen 1 - Surgical pathology Differential Diagnosis: BCC bx proven  Check Margins: yes Pink bx site [id number] Black ink at 12 oclock sup edge  Bx proven  Start Mupirocin ointment qd to wound with dressing changes (pt's daughter has Mupirocin and will give him her tube)  Traumatic injury Left Ankle - Anterior Vs other Wound cleansed, mupirocin applied and Band-Aid. Recheck on f/u   AK (actinic keratosis) nasal tip x 1 Destruction of lesion - nasal tip x 1 Complexity: simple   Destruction method: cryotherapy   Informed consent: discussed and consent obtained   Timeout:  patient name, date of birth, surgical site, and procedure verified Lesion destroyed using liquid nitrogen: Yes   Region frozen until ice ball extended beyond lesion: Yes   Outcome: patient tolerated procedure well with no complications   Post-procedure details: wound care instructions given    Inflamed seborrheic keratosis Left lat nasal alar rim x 1 Destruction of lesion - Left lat nasal alar rim x 1 Complexity: simple   Destruction method: cryotherapy   Informed consent: discussed and consent obtained   Timeout:  patient name, date of birth, surgical site, and procedure verified Lesion destroyed using liquid nitrogen: Yes   Region frozen until ice ball extended beyond lesion: Yes   Outcome: patient tolerated procedure well with no complications   Post-procedure details: wound care instructions given    Actinic Damage - chronic, secondary to  cumulative UV radiation exposure/sun exposure over time - diffuse scaly erythematous macules with underlying dyspigmentation - Recommend daily broad spectrum sunscreen SPF 30+ to sun-exposed areas, reapply every 2 hours as needed.  - Call for new or changing lesions.  Return in about 1 week (around 12/08/2020) for surgery f/u.  I, Othelia Pulling, RMA, am acting as scribe for  Sarina Ser, MD .  Documentation: I have reviewed the above documentation for accuracy and completeness, and I agree with the above.  Sarina Ser, MD

## 2020-12-01 NOTE — Telephone Encounter (Signed)
Spoke to patient's daughter and she said patient is doing fine after today's surgery.Lonnie [name]

## 2020-12-07 ENCOUNTER — Telehealth: Payer: Self-pay

## 2020-12-07 NOTE — Telephone Encounter (Signed)
Spoke with patient's Daughter this morning. Patients Gel Foam started coming out on Friday and daughter noticed ear was very inflamed and showed signs of infection. Patient and daughter ended up going to the [facility] office where he was prescribed Doxycycline and Cipro. Patient's daughter also states they are running low on Mupirocin but enough to get through appt tomorrow with Dr. Nehemiah Massed.   Daughter states she may or may not be with patient tomorrow at follow up appointment so she wanted you to be aware.

## 2020-12-07 NOTE — Telephone Encounter (Signed)
Reviewed note

## 2020-12-08 ENCOUNTER — Ambulatory Visit: Payer: Medicare Other | Admitting: Dermatology

## 2020-12-08 ENCOUNTER — Emergency Department: Payer: No Typology Code available for payment source

## 2020-12-08 ENCOUNTER — Inpatient Hospital Stay
Admission: EM | Admit: 2020-12-08 | Discharge: 2020-12-14 | DRG: 871 | Disposition: A | Discharge status: Skilled Nursing Facility | Payer: No Typology Code available for payment source | Attending: Internal Medicine | Admitting: Internal Medicine

## 2020-12-08 ENCOUNTER — Other Ambulatory Visit: Payer: Self-pay

## 2020-12-08 ENCOUNTER — Observation Stay: Payer: No Typology Code available for payment source

## 2020-12-08 DIAGNOSIS — K819 Cholecystitis, unspecified: Secondary | ICD-10-CM | POA: Diagnosis not present

## 2020-12-08 DIAGNOSIS — Z882 Allergy status to sulfonamides status: Secondary | ICD-10-CM

## 2020-12-08 DIAGNOSIS — I129 Hypertensive chronic kidney disease with stage 1 through stage 4 chronic kidney disease, or unspecified chronic kidney disease: Secondary | ICD-10-CM | POA: Diagnosis present

## 2020-12-08 DIAGNOSIS — Z85828 Personal history of other malignant neoplasm of skin: Secondary | ICD-10-CM

## 2020-12-08 DIAGNOSIS — B952 Enterococcus as the cause of diseases classified elsewhere: Secondary | ICD-10-CM | POA: Diagnosis present

## 2020-12-08 DIAGNOSIS — I10 Essential (primary) hypertension: Secondary | ICD-10-CM

## 2020-12-08 DIAGNOSIS — M1A9XX0 Chronic gout, unspecified, without tophus (tophi): Secondary | ICD-10-CM | POA: Diagnosis present

## 2020-12-08 DIAGNOSIS — K219 Gastro-esophageal reflux disease without esophagitis: Secondary | ICD-10-CM | POA: Diagnosis present

## 2020-12-08 DIAGNOSIS — R109 Unspecified abdominal pain: Secondary | ICD-10-CM

## 2020-12-08 DIAGNOSIS — J810 Acute pulmonary edema: Secondary | ICD-10-CM | POA: Diagnosis not present

## 2020-12-08 DIAGNOSIS — J9600 Acute respiratory failure, unspecified whether with hypoxia or hypercapnia: Secondary | ICD-10-CM | POA: Diagnosis not present

## 2020-12-08 DIAGNOSIS — N390 Urinary tract infection, site not specified: Secondary | ICD-10-CM | POA: Diagnosis present

## 2020-12-08 DIAGNOSIS — R972 Elevated prostate specific antigen [PSA]: Secondary | ICD-10-CM | POA: Diagnosis not present

## 2020-12-08 DIAGNOSIS — A419 Sepsis, unspecified organism: Secondary | ICD-10-CM | POA: Diagnosis not present

## 2020-12-08 DIAGNOSIS — Z20822 Contact with and (suspected) exposure to covid-19: Secondary | ICD-10-CM | POA: Diagnosis present

## 2020-12-08 DIAGNOSIS — D631 Anemia in chronic kidney disease: Secondary | ICD-10-CM | POA: Diagnosis present

## 2020-12-08 DIAGNOSIS — Z7951 Long term (current) use of inhaled steroids: Secondary | ICD-10-CM

## 2020-12-08 DIAGNOSIS — Z87891 Personal history of nicotine dependence: Secondary | ICD-10-CM

## 2020-12-08 DIAGNOSIS — I447 Left bundle-branch block, unspecified: Secondary | ICD-10-CM | POA: Diagnosis present

## 2020-12-08 DIAGNOSIS — R0902 Hypoxemia: Secondary | ICD-10-CM

## 2020-12-08 DIAGNOSIS — J189 Pneumonia, unspecified organism: Secondary | ICD-10-CM | POA: Diagnosis present

## 2020-12-08 DIAGNOSIS — N401 Enlarged prostate with lower urinary tract symptoms: Secondary | ICD-10-CM | POA: Diagnosis present

## 2020-12-08 DIAGNOSIS — K5730 Diverticulosis of large intestine without perforation or abscess without bleeding: Secondary | ICD-10-CM | POA: Diagnosis present

## 2020-12-08 DIAGNOSIS — Z8249 Family history of ischemic heart disease and other diseases of the circulatory system: Secondary | ICD-10-CM

## 2020-12-08 DIAGNOSIS — E785 Hyperlipidemia, unspecified: Secondary | ICD-10-CM | POA: Diagnosis present

## 2020-12-08 DIAGNOSIS — K810 Acute cholecystitis: Secondary | ICD-10-CM | POA: Diagnosis present

## 2020-12-08 DIAGNOSIS — C44219 Basal cell carcinoma of skin of left ear and external auricular canal: Secondary | ICD-10-CM | POA: Diagnosis present

## 2020-12-08 DIAGNOSIS — I2510 Atherosclerotic heart disease of native coronary artery without angina pectoris: Secondary | ICD-10-CM | POA: Diagnosis present

## 2020-12-08 DIAGNOSIS — Z8701 Personal history of pneumonia (recurrent): Secondary | ICD-10-CM

## 2020-12-08 DIAGNOSIS — N138 Other obstructive and reflux uropathy: Secondary | ICD-10-CM | POA: Diagnosis present

## 2020-12-08 DIAGNOSIS — N1832 Chronic kidney disease, stage 3b: Secondary | ICD-10-CM | POA: Diagnosis present

## 2020-12-08 DIAGNOSIS — Z7982 Long term (current) use of aspirin: Secondary | ICD-10-CM

## 2020-12-08 DIAGNOSIS — Z880 Allergy status to penicillin: Secondary | ICD-10-CM

## 2020-12-08 DIAGNOSIS — N1831 Chronic kidney disease, stage 3a: Secondary | ICD-10-CM | POA: Diagnosis present

## 2020-12-08 DIAGNOSIS — N183 Chronic kidney disease, stage 3 unspecified: Secondary | ICD-10-CM | POA: Diagnosis present

## 2020-12-08 DIAGNOSIS — K5909 Other constipation: Secondary | ICD-10-CM | POA: Diagnosis present

## 2020-12-08 DIAGNOSIS — Z79899 Other long term (current) drug therapy: Secondary | ICD-10-CM

## 2020-12-08 DIAGNOSIS — N179 Acute kidney failure, unspecified: Secondary | ICD-10-CM | POA: Diagnosis not present

## 2020-12-08 DIAGNOSIS — J690 Pneumonitis due to inhalation of food and vomit: Secondary | ICD-10-CM | POA: Diagnosis present

## 2020-12-08 DIAGNOSIS — N312 Flaccid neuropathic bladder, not elsewhere classified: Secondary | ICD-10-CM | POA: Diagnosis present

## 2020-12-08 DIAGNOSIS — N400 Enlarged prostate without lower urinary tract symptoms: Secondary | ICD-10-CM | POA: Diagnosis present

## 2020-12-08 LAB — CBC WITH DIFFERENTIAL/PLATELET
Abs Immature Granulocytes: 0.13 10*3/uL — ABNORMAL HIGH (ref 0.00–0.07)
Basophils Absolute: 0.1 10*3/uL (ref 0.0–0.1)
Basophils Relative: 0 %
Eosinophils Absolute: 0 10*3/uL (ref 0.0–0.5)
Eosinophils Relative: 0 %
HCT: 36 % — ABNORMAL LOW (ref 39.0–52.0)
Hemoglobin: 12.3 g/dL — ABNORMAL LOW (ref 13.0–17.0)
Immature Granulocytes: 1 %
Lymphocytes Relative: 4 %
Lymphs Abs: 0.8 10*3/uL (ref 0.7–4.0)
MCH: 33.4 pg (ref 26.0–34.0)
MCHC: 34.2 g/dL (ref 30.0–36.0)
MCV: 97.8 fL (ref 80.0–100.0)
Monocytes Absolute: 1.5 10*3/uL — ABNORMAL HIGH (ref 0.1–1.0)
Monocytes Relative: 7 %
Neutro Abs: 18.9 10*3/uL — ABNORMAL HIGH (ref 1.7–7.7)
Neutrophils Relative %: 88 %
Platelets: 181 10*3/uL (ref 150–400)
RBC: 3.68 MIL/uL — ABNORMAL LOW (ref 4.22–5.81)
RDW: 13.3 % (ref 11.5–15.5)
WBC: 21.3 10*3/uL — ABNORMAL HIGH (ref 4.0–10.5)
nRBC: 0 % (ref 0.0–0.2)

## 2020-12-08 LAB — PROTIME-INR
INR: 1.1 (ref 0.8–1.2)
Prothrombin Time: 13.3 seconds (ref 11.4–15.2)

## 2020-12-08 LAB — URINALYSIS, COMPLETE (UACMP) WITH MICROSCOPIC
Bacteria, UA: NONE SEEN
Bilirubin Urine: NEGATIVE
Glucose, UA: 50 mg/dL — AB
Ketones, ur: NEGATIVE mg/dL
Nitrite: NEGATIVE
Protein, ur: 100 mg/dL — AB
Specific Gravity, Urine: 1.013 (ref 1.005–1.030)
Squamous Epithelial / HPF: NONE SEEN (ref 0–5)
pH: 8 (ref 5.0–8.0)

## 2020-12-08 LAB — HEPATIC FUNCTION PANEL
ALT: 16 U/L (ref 0–44)
AST: 23 U/L (ref 15–41)
Albumin: 3.7 g/dL (ref 3.5–5.0)
Alkaline Phosphatase: 89 U/L (ref 38–126)
Bilirubin, Direct: 0.1 mg/dL (ref 0.0–0.2)
Indirect Bilirubin: 0.8 mg/dL (ref 0.3–0.9)
Total Bilirubin: 0.9 mg/dL (ref 0.3–1.2)
Total Protein: 7.1 g/dL (ref 6.5–8.1)

## 2020-12-08 LAB — BASIC METABOLIC PANEL
Anion gap: 10 (ref 5–15)
BUN: 21 mg/dL (ref 8–23)
CO2: 21 mmol/L — ABNORMAL LOW (ref 22–32)
Calcium: 9 mg/dL (ref 8.9–10.3)
Chloride: 106 mmol/L (ref 98–111)
Creatinine, Ser: 1.43 mg/dL — ABNORMAL HIGH (ref 0.61–1.24)
GFR, Estimated: 46 mL/min — ABNORMAL LOW (ref >60)
Glucose, Bld: 101 mg/dL — ABNORMAL HIGH (ref 70–99)
Potassium: 4.2 mmol/L (ref 3.5–5.1)
Sodium: 137 mmol/L (ref 135–145)

## 2020-12-08 LAB — LIPASE, BLOOD: Lipase: 35 U/L (ref 11–51)

## 2020-12-08 LAB — LACTIC ACID, PLASMA
Lactic Acid, Venous: 1.2 mmol/L (ref 0.5–1.9)
Lactic Acid, Venous: 9.3 mmol/L (ref 0.5–1.9)
Lactic Acid, Venous: 6.6 mmol/L (ref 0.5–1.9)

## 2020-12-08 MED ORDER — METRONIDAZOLE IN NACL 5-0.79 MG/ML-% IV SOLN
500.0000 mg | Freq: Three times a day (TID) | INTRAVENOUS | Status: DC
  Administered 2020-12-08 – 2020-12-11 (×9): 500 mg via INTRAVENOUS
  Filled 2020-12-08 (×12): qty 100

## 2020-12-08 MED ORDER — MELATONIN 5 MG PO TABS
5.0000 mg | ORAL_TABLET | Freq: Every day | ORAL | Status: DC
  Administered 2020-12-08 – 2020-12-13 (×6): 5 mg via ORAL
  Filled 2020-12-08 (×6): qty 1

## 2020-12-08 MED ORDER — LACTATED RINGERS IV SOLN: INTRAVENOUS | Status: AC

## 2020-12-08 MED ORDER — ATORVASTATIN CALCIUM 20 MG PO TABS
40.0000 mg | ORAL_TABLET | ORAL | Status: DC
  Administered 2020-12-09 – 2020-12-14 (×6): 40 mg via ORAL
  Filled 2020-12-08 (×6): qty 2

## 2020-12-08 MED ORDER — SODIUM CHLORIDE 0.9 % IV SOLN
2.0000 g | INTRAVENOUS | Status: DC
  Administered 2020-12-09 – 2020-12-11 (×3): 2 g via INTRAVENOUS
  Filled 2020-12-08: qty 20
  Filled 2020-12-08: qty 2
  Filled 2020-12-08: qty 20
  Filled 2020-12-08: qty 2

## 2020-12-08 MED ORDER — ONDANSETRON HCL 4 MG PO TABS: 4.0000 mg | ORAL_TABLET | Freq: Four times a day (QID) | ORAL | Status: DC | PRN

## 2020-12-08 MED ORDER — MAGNESIUM OXIDE 250 MG PO TABS
500.0000 mg | ORAL_TABLET | ORAL | Status: DC
Start: 2020-12-09 — End: 2020-12-08

## 2020-12-08 MED ORDER — SODIUM CHLORIDE 0.9 % IV SOLN
2.0000 g | Freq: Once | INTRAVENOUS | Status: AC
  Administered 2020-12-08: 2 g via INTRAVENOUS
  Filled 2020-12-08: qty 20

## 2020-12-08 MED ORDER — MOMETASONE FURO-FORMOTEROL FUM 200-5 MCG/ACT IN AERO
2.0000 | INHALATION_SPRAY | Freq: Two times a day (BID) | RESPIRATORY_TRACT | Status: DC
  Administered 2020-12-08 – 2020-12-13 (×11): 2 via RESPIRATORY_TRACT
  Filled 2020-12-08 (×2): qty 8.8

## 2020-12-08 MED ORDER — NIFEDIPINE ER 60 MG PO TB24
60.0000 mg | ORAL_TABLET | ORAL | Status: DC
  Administered 2020-12-09 – 2020-12-14 (×6): 60 mg via ORAL
  Filled 2020-12-08 (×8): qty 1

## 2020-12-08 MED ORDER — MAGNESIUM CITRATE PO SOLN
1.0000 | Freq: Once | ORAL | Status: AC
  Administered 2020-12-08: 1 via ORAL
  Filled 2020-12-08: qty 296

## 2020-12-08 MED ORDER — SODIUM CHLORIDE 0.9 % IV BOLUS
500.0000 mL | Freq: Once | INTRAVENOUS | Status: AC
  Administered 2020-12-08: 500 mL via INTRAVENOUS

## 2020-12-08 MED ORDER — IPRATROPIUM-ALBUTEROL 20-100 MCG/ACT IN AERS
1.0000 | INHALATION_SPRAY | RESPIRATORY_TRACT | Status: DC
  Administered 2020-12-09 – 2020-12-14 (×6): 1 via RESPIRATORY_TRACT
  Filled 2020-12-08 (×2): qty 4

## 2020-12-08 MED ORDER — ACETAMINOPHEN 325 MG PO TABS: 650.0000 mg | ORAL_TABLET | Freq: Four times a day (QID) | ORAL | Status: DC | PRN

## 2020-12-08 MED ORDER — ONDANSETRON HCL 4 MG/2ML IJ SOLN
4.0000 mg | Freq: Four times a day (QID) | INTRAMUSCULAR | Status: DC | PRN
  Administered 2020-12-10: 4 mg via INTRAVENOUS
  Filled 2020-12-08: qty 2

## 2020-12-08 MED ORDER — IOHEXOL 300 MG/ML  SOLN
100.0000 mL | Freq: Once | INTRAMUSCULAR | Status: AC | PRN
  Administered 2020-12-08: 100 mL via INTRAVENOUS

## 2020-12-08 MED ORDER — BISACODYL 5 MG PO TBEC
10.0000 mg | DELAYED_RELEASE_TABLET | Freq: Two times a day (BID) | ORAL | Status: DC
  Administered 2020-12-08 – 2020-12-13 (×10): 10 mg via ORAL
  Filled 2020-12-08 (×10): qty 2

## 2020-12-08 MED ORDER — SENNOSIDES-DOCUSATE SODIUM 8.6-50 MG PO TABS
2.0000 | ORAL_TABLET | Freq: Every day | ORAL | Status: DC
  Administered 2020-12-08 – 2020-12-14 (×6): 2 via ORAL
  Filled 2020-12-08 (×8): qty 2

## 2020-12-08 MED ORDER — MORPHINE SULFATE (PF) 2 MG/ML IV SOLN
2.0000 mg | INTRAVENOUS | Status: AC | PRN
  Administered 2020-12-09: 2 mg via INTRAVENOUS
  Filled 2020-12-08 (×2): qty 1

## 2020-12-08 MED ORDER — POLYETHYLENE GLYCOL 3350 17 G PO PACK
17.0000 g | PACK | Freq: Every day | ORAL | Status: DC
  Administered 2020-12-09 – 2020-12-13 (×5): 17 g via ORAL
  Filled 2020-12-08 (×6): qty 1

## 2020-12-08 MED ORDER — SODIUM CHLORIDE 0.9 % IV SOLN
500.0000 mg | INTRAVENOUS | Status: DC
  Administered 2020-12-08: 500 mg via INTRAVENOUS
  Filled 2020-12-08 (×2): qty 500

## 2020-12-08 MED ORDER — ALLOPURINOL 100 MG PO TABS
300.0000 mg | ORAL_TABLET | ORAL | Status: DC
Start: 2020-12-09 — End: 2020-12-14
  Administered 2020-12-09 – 2020-12-14 (×6): 300 mg via ORAL
  Filled 2020-12-08: qty 1
  Filled 2020-12-08 (×6): qty 3

## 2020-12-08 MED ORDER — METRONIDAZOLE IN NACL 5-0.79 MG/ML-% IV SOLN
500.0000 mg | Freq: Once | INTRAVENOUS | Status: AC
  Administered 2020-12-08: 500 mg via INTRAVENOUS
  Filled 2020-12-08: qty 100

## 2020-12-08 MED ORDER — PANTOPRAZOLE SODIUM 40 MG PO TBEC
40.0000 mg | DELAYED_RELEASE_TABLET | Freq: Every day | ORAL | Status: DC
  Administered 2020-12-08 – 2020-12-14 (×7): 40 mg via ORAL
  Filled 2020-12-08 (×7): qty 1

## 2020-12-08 MED ORDER — ACETAMINOPHEN 650 MG RE SUPP: 650.0000 mg | Freq: Four times a day (QID) | RECTAL | Status: DC | PRN

## 2020-12-08 MED ORDER — LACTATED RINGERS IV SOLN: INTRAVENOUS | Status: DC

## 2020-12-08 MED ORDER — FINASTERIDE 5 MG PO TABS
5.0000 mg | ORAL_TABLET | ORAL | Status: DC
  Administered 2020-12-09 – 2020-12-14 (×6): 5 mg via ORAL
  Filled 2020-12-08 (×7): qty 1

## 2020-12-08 MED ORDER — LISINOPRIL 20 MG PO TABS
40.0000 mg | ORAL_TABLET | Freq: Every day | ORAL | Status: DC
  Administered 2020-12-09 – 2020-12-12 (×4): 40 mg via ORAL
  Filled 2020-12-08 (×4): qty 2

## 2020-12-08 NOTE — ED Notes (Signed)
Pt to xray

## 2020-12-08 NOTE — ED Provider Notes (Signed)
[facility] Emergency Department Provider Note   ____________________________________________   Event Date/Time   First MD Initiated Contact with Patient 12/08/20 [phone]     (approximate)  I have reviewed the triage vital signs and the nursing notes.   HISTORY  Chief Complaint Bloated (Constipation)    HPI Lonnie A Amando Chaput. is a 87 y.o. male history of hypertension, previous pneumonia, asbestosis, skin cancer  Patient presents today states for about 5 days now he has not had a bowel movement.  Yesterday he vomited once or twice after trying to eat.  He feels like his stomach is a little sore but feels very bloated.  EMS reports family had attempted to disimpact him without success.  Patient reports he just feels like he is very bloated, cannot seem to have a bowel movement.  No chest pain no trouble breathing no fever.  Had an infection started antibiotic for over the back of his left ear that seems to be improving.  Been on that for a few days now.   Past Medical History:  Diagnosis Date  . Anemia   . Basal cell carcinoma 11/13/2009   Right alar groove  . Basal cell carcinoma 04/28/2010   left preauricular, BCC with focal sclerosis. Exc. 06/04/2010  . Basal cell carcinoma 04/24/2013   right post auricular  . Basal cell carcinoma 10/26/2017   right inferior cheek above mandible, Superficial  . Basal cell carcinoma 03/22/2018   right post. base of neck  . Basal cell carcinoma 05/08/2018   post. neck, spinal, Superficial. EDC 05/08/2018  . Basal cell carcinoma 07/23/2018   left mid supraclavicular, Superficial and nodular pattern. EDC  . Basal cell carcinoma 07/23/2018   right superior chest parasternal. Superficial and nodular pattern. EDC  . Basal cell carcinoma 05/16/2019   left lateral neck. Superficial and nodular pattern. EDC  . Basal cell carcinoma 11/07/2019   left lateral forehead. Nodular. EDC  . Basal cell carcinoma 12/23/2019   Left  ala nasi. BCC with sclerosis.  . Basal cell carcinoma 11/19/2020   L ear post aspect,excised 12/01/20  . Bundle branch block, left   . CKD (chronic kidney disease) stage 3, GFR 30-59 ml/min (HCC)   . COPD (chronic obstructive pulmonary disease) ([place])   . Coronary artery disease   . Family history of adverse reaction to anesthesia    DAUGHTER-N/V  . GERD (gastroesophageal reflux disease)   . History of asbestosis   . HLD (hyperlipidemia)   . Hypertension   . Pneumonia 2017  . PSA elevation     Patient Active Problem List   Diagnosis Date Noted  . Hypotonicity of bladder 11/29/2018  . Acute lower UTI 08/11/2018  . UTI (urinary tract infection) 08/11/2018  . Incomplete bladder emptying 08/09/2017  . 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 Stage 3 chronic kidney disease ([place]) 08/09/2017  . Pneumonia 01/10/2016  . Nocturia 01/28/2015  . Bundle branch block, left 06/11/2014  . Dizziness 06/11/2014  . Dyspnea 06/11/2014  . History of vertigo 06/11/2014  . Hx of asbestosis 06/11/2014  . Hyperlipidemia 06/11/2014  . Hypertension 06/11/2014  . Chronic obstructive pulmonary disease, unspecified ([place]) 03/06/2014  . Elevated prostate specific antigen (PSA) 05/13/2013    Past Surgical History:  Procedure Laterality Date  . APPENDECTOMY    . COLONOSCOPY WITH ESOPHAGOGASTRODUODENOSCOPY (EGD)    . CYSTOSCOPY WITH INSERTION OF UROLIFT N/A 10/06/2020   Procedure: CYSTOSCOPY WITH INSERTION OF UROLIFT;  Surgeon: Abbie Sons,  MD;  Location: [facility] ORS;  Service: Urology;  Laterality: N/A;  . RECTAL SURGERY     x3 ABSCESS    Prior to Admission medications   Medication Sig Start Date End Date Taking? Authorizing Provider  alfuzosin (UROXATRAL) 10 MG 24 hr tablet Take 10 mg by mouth daily with breakfast.    [provider]  allopurinol (ZYLOPRIM) 300 MG tablet Take 300 mg by mouth every morning.    [provider]  aspirin 81 MG tablet Take 81 mg  by mouth daily.    [provider]  atorvastatin (LIPITOR) 40 MG tablet Take 40 mg by mouth every morning.    [provider]  budesonide-formoterol (SYMBICORT) 160-4.5 MCG/ACT inhaler Inhale 2 puffs into the lungs 2 (two) times daily.  01/31/18   [provider]  ferrous sulfate 325 (65 FE) MG tablet Take 325 mg by mouth 2 (two) times daily with a meal.     [provider]  finasteride (PROSCAR) 5 MG tablet Take 5 mg by mouth every morning.    [provider]  gabapentin (NEURONTIN) 300 MG capsule Take 600 mg by mouth 2 (two) times daily.     [provider]  Ipratropium-Albuterol (COMBIVENT RESPIMAT) 20-100 MCG/ACT AERS respimat Inhale 1 puff into the lungs every morning.    [provider]  lisinopril (PRINIVIL,ZESTRIL) 40 MG tablet Take 40 mg by mouth daily.    [provider]  Magnesium Oxide 250 MG TABS Take 500 mg by mouth every morning.    [provider]  Melatonin 5 MG TABS Take 5 mg by mouth at bedtime.     [provider]  Multiple Vitamins-Minerals (MULTIVITAMIN WITH MINERALS) tablet Take 1 tablet by mouth daily.    [provider]  NIFEdipine (PROCARDIA-XL/NIFEDICAL-XL) 30 MG 24 hr tablet Take 30 mg by mouth every morning.    [provider]  Omega-3 1000 MG CAPS Take 1,000 mg by mouth daily.    [provider]  omeprazole (PRILOSEC) 20 MG capsule Take 20 mg by mouth 2 (two) times daily.    [provider]  ondansetron (ZOFRAN ODT) 4 MG disintegrating tablet Take 1 tablet (4 mg total) by mouth every 8 (eight) hours as needed for nausea or vomiting. 10/06/20   Stoioff, Ronda Fairly, MD  sennosides-docusate sodium (SENOKOT-S) 8.6-50 MG tablet Take 2 tablets by mouth daily.    [provider]  vitamin B-12 (CYANOCOBALAMIN) 1000 MCG tablet Take 1,000 mcg by mouth daily.    [provider]  vitamin C (ASCORBIC ACID) 250 MG tablet Take 250 mg by mouth 2 (two)  times daily.    [provider]    Allergies Sulfa antibiotics, Sulfasalazine, and Penicillin g  Family History  Problem Relation Age of Onset  . Heart attack Mother   . Heart attack Father     Social History Social History   Tobacco Use  . Smoking status: Former Smoker    Packs/day: 2.00    Years: 50.00    Pack years: 100.00    Types: Cigarettes    Quit date: 09/24/1989    Years since quitting: 31.2  . Smokeless tobacco: Never Used  Vaping Use  . Vaping Use: Never used  Substance Use Topics  . Alcohol use: Yes    Alcohol/week: 0.0 standard drinks    Comment: OCC RUM AND COKE  . Drug use: No    Review of Systems Constitutional: No fever/chills Eyes: No visual changes. ENT: No  sore throat. Cardiovascular: Denies chest pain. Respiratory: Denies shortness of breath. Gastrointestinal: See HPI.  Reports is not real painful just feels really bloated but is a little sore more in the upper abdomen Genitourinary: Negative for dysuria.  Reports chronic trouble with urination no new changes, hesitancy with urination for a long time. Musculoskeletal: Negative for back pain. Skin: Negative for rash. Neurological: Negative for headaches, areas of focal weakness or numbness.    ____________________________________________   PHYSICAL EXAM:  VITAL SIGNS: ED Triage Vitals  Enc Vitals Group     BP 12/08/20 0859 (!) 176/79     Pulse Rate 12/08/20 0859 (!) 108     Resp 12/08/20 0859 18     Temp 12/08/20 0859 98.4 F (36.9 C)     Temp Source 12/08/20 0859 Oral     SpO2 12/08/20 0859 97 %     Weight 12/08/20 0855 183 lb (83 kg)     Height 12/08/20 0855 5\' 6"  (1.676 m)     Head Circumference --      Peak Flow --      Pain Score 12/08/20 0853 0     Pain Loc --      Pain Edu? --      Excl. in [place]? --     Constitutional: Alert and oriented. Well appearing and in no acute distress.  Somewhat frail in appearance. Eyes: Conjunctivae are normal. Head: Atraumatic.   Behind the left ear behind the helix he has an area that appears scarred, but there is no evidence of erythema purulence or drainage.  He reports is the area where he had the infection seems to be getting better.  Also reports he had skin cancer there Nose: No congestion/rhinnorhea. Mouth/Throat: Mucous membranes are moist. Neck: No stridor.  Cardiovascular: Normal rate, regular rhythm. Grossly normal heart sounds.  Good peripheral circulation. Respiratory: Normal respiratory effort.  No retractions. Lungs CTAB except question of degree of bowel sounds heard in the right lower chest versus slight crackles in the right lower base or may be some type of dry crackles. Gastrointestinal: Abdomen is not rigid, but is quite protuberant and does seem distended.  It is tympanic in mostly the right upper and right upper quadrant with some tenderness to percussion in the right upper quadrant. Focal discomfort across the upper abdomen more right sided.   Musculoskeletal: No lower extremity tenderness nor edema. Neurologic:  Normal speech and language. No gross focal neurologic deficits are appreciated.  Skin:  Skin is warm, dry and intact. No rash noted. Psychiatric: Mood and affect are normal. Speech and behavior are normal.  ____________________________________________   LABS (all labs ordered are listed, but only abnormal results are displayed)  Labs Reviewed  CBC WITH DIFFERENTIAL/PLATELET - Abnormal; Notable for the following components:      Result Value   WBC 21.3 (*)    RBC 3.68 (*)    Hemoglobin 12.3 (*)    HCT 36.0 (*)    Neutro Abs 18.9 (*)    Monocytes Absolute 1.5 (*)    Abs Immature Granulocytes 0.13 (*)    All other components within normal limits  URINALYSIS, COMPLETE (UACMP) WITH MICROSCOPIC - Abnormal; Notable for the following components:   Color, Urine YELLOW (*)    APPearance CLEAR (*)    Glucose, UA 50 (*)    Hgb urine dipstick SMALL (*)    Protein, ur 100 (*)     Leukocytes,Ua TRACE (*)    All other components within normal  limits  BASIC METABOLIC PANEL - Abnormal; Notable for the following components:   CO2 21 (*)    Glucose, Bld 101 (*)    Creatinine, Ser 1.43 (*)    GFR, Estimated 46 (*)    All other components within normal limits  SARS CORONAVIRUS 2 (TAT 6-24 HRS)  CULTURE, BLOOD (ROUTINE X 2)  CULTURE, BLOOD (ROUTINE X 2)  HEPATIC FUNCTION PANEL  LIPASE, BLOOD  PROTIME-INR   ____________________________________________  EKG  Reviewed inter by me at 9 AM Heart rate 105 QRS 140 QTc 470 Sinus rhythm, suspect first-degree AV block.  There is a left bundle branch block present.  There is no evidence of acute ischemia denoted. ____________________________________________  RADIOLOGY  CT ABDOMEN PELVIS W CONTRAST  Result Date: 12/08/2020 CLINICAL DATA:  Abdominal pain with distension constipation, bowel obstruction suspected. EXAM: CT ABDOMEN AND PELVIS WITH CONTRAST TECHNIQUE: Multidetector CT imaging of the abdomen and pelvis was performed using the standard protocol following bolus administration of intravenous contrast. CONTRAST:  18mL OMNIPAQUE IOHEXOL 300 MG/ML  SOLN COMPARISON:  Chest CT March 06, 2019 FINDINGS: Lower chest: New nodular left lower lobe ground-glass opacity. Bibasilar bronchiectasis with areas of mucoid impaction. Bilateral calcified pleural plaques. Normal size heart. Coronary artery calcifications. Mitral annulus calcifications. No significant pericardial effusion or thickening. Hepatobiliary: No suspicious hepatic lesion. Gallbladder is distended with gallbladder wall thickening and pericholecystic fluid. No biliary ductal dilation. Pancreas: Unremarkable. Spleen: Unremarkable Adrenals/Urinary Tract: Bilateral adrenal glands are unremarkable. No hydronephrosis. Bilateral cortical renal scarring. Bilateral tiny subcentimeter hypodense renal lesions which are technically too small to accurately characterize but favored  represent cysts. Postsurgical changes of urolift procedure. Mild trabecular thickening of the urinary bladder. Right posterior bladder diverticulum versus pseudo ureterocele on image 82/2. Stomach/Bowel: Stomach is decompressed limiting evaluation. Normal positioning of the duodenum/ligament of [place]. No evidence of bowel obstruction. The appendix is surgically absent per report. Moderate volume of formed stool throughout the colon. Colonic diverticulosis without findings of acute diverticulitis. Vascular/Lymphatic: Aortic atherosclerosis. No enlarged abdominal or pelvic lymph nodes. Reproductive: Prostate is present with changes of recent urolift procedure. Other: No abdominopelvic ascites. Musculoskeletal: Multilevel degenerative changes spine. Degenerative change of the bilateral hips. No acute osseous abnormality. IMPRESSION: 1. Distended gallbladder with gallbladder wall thickening and pericholecystic fluid. Findings are concerning for acute cholecystitis. 2. New nodular left lower lobe ground-glass opacity may represent pneumonia recommend follow-up dedicated chest CT in 2-3 months to ensure resolution. 3. Postsurgical changes urolift insertion with nodular thickening of the adjacent right side pelvic musculature, which may be sequela of the procedure. Recommend attention on follow-up imaging. 4. Colonic diverticulosis without findings of acute diverticulitis. 5. Moderate volume of formed stool throughout the colon. 6. Aortic atherosclerosis.  Aortic Atherosclerosis (ICD10-I70.0). These results were called by telephone at the time of interpretation on 12/08/2020 at 11:04 am to provider Coralynn Gaona , who verbally acknowledged these results. Electronically Signed   By: Dahlia Bailiff MD   On: 12/08/2020 11:06   DG Abdomen Acute W/Chest  Result Date: 12/08/2020 CLINICAL DATA:  Distended abdomen EXAM: DG ABDOMEN ACUTE WITH 1 VIEW CHEST COMPARISON:  Chest x-ray 08/11/2018 FINDINGS: Heart size and vascularity  normal. Negative for heart failure. Negative for pneumonia. Large calcified plaque overlying the left chest. Calcified pleural plaque also noted on the right. Mild bibasilar atelectasis. Normal bowel gas pattern. Negative for bowel obstruction or ileus. No free air. No urinary tract calculi. Surgical clips in the region of the prostate. Mild degenerative change lumbar spine. IMPRESSION:  Mild bibasilar atelectasis. Bilateral calcified pleural plaques, chronic Normal bowel gas pattern. Electronically Signed   By: Franchot Gallo M.D.   On: 12/08/2020 10:00    CT reviewed, most notable for possible cholecystitis. Discussed with dr. Peyton Najjar ____________________________________________   PROCEDURES  Procedure(s) performed: None  Procedures  Critical Care performed: Yes, see critical care note(s)  CRITICAL CARE Performed by: Delman Kitten   Total critical care time: 30 minutes  Critical care time was exclusive of separately billable procedures and treating other patients.  Critical care was necessary to treat or prevent imminent or life-threatening deterioration.  Critical care was time spent personally by me on the following activities: development of treatment plan with patient and/or surrogate as well as nursing, discussions with consultants, evaluation of patient's response to treatment, examination of patient, obtaining history from patient or surrogate, ordering and performing treatments and interventions, ordering and review of laboratory studies, ordering and review of radiographic studies, pulse oximetry and re-evaluation of patient's condition.  ____________________________________________   INITIAL IMPRESSION / ASSESSMENT AND PLAN / ED COURSE  Pertinent labs & imaging results that were available during my care of the patient were reviewed by me and considered in my medical decision making (see chart for details).   Differential diagnosis includes but is not limited to, abdominal  perforation, aortic dissection, cholecystitis, appendicitis, diverticulitis, colitis, esophagitis/gastritis, kidney stone, pyelonephritis, urinary tract infection, aortic aneurysm. All are considered in decision and treatment plan. Based upon the patient's presentation and risk factors, and his distended abdomen with associated vomiting we will proceed by obtaining CT scan to evaluate for obstructive pathology or other acute intra-abdominal pathology for which I do also have suspicion for potentially peritonitis, gallstone disease etc.  However overall appears distended, constipation could certainly be on the differential as well.  Denies acute cardiac or respiratory complaints   Clinical Course as of 12/08/20 1237  Tue Dec 08, 2020  0905 Patient denies is wanting any pain medicine, just feels very bloated.  Offered nausea and pain medicine he declined these. [MQ]  [phone] Labs to this point notable for significant leukocytosis.  Normal LFTs.  Hemoglobin mild anemia not acute [MQ]  1159 Dr. Peyton Najjar consulted. [MQ]    Clinical Course User Index [MQ] Delman Kitten, MD    ----------------------------------------- 12:36 PM on 12/08/2020 -----------------------------------------  Patient mid to hospitalist service Dr. Tobie Poet.  Surgical consult to occur.  Patient and daughter at bedside understanding agreeable with plan for admission.  He is fully awake and alert without distress at this time.  Antibiotics ordered ____________________________________________   FINAL CLINICAL IMPRESSION(S) / ED DIAGNOSES  Final diagnoses:  Abdominal pain  Acute cholecystitis        Note:  This document was prepared using Dragon voice recognition software and may include unintentional dictation errors       Delman Kitten, MD 12/08/20 1237

## 2020-12-08 NOTE — Consult Note (Signed)
SURGICAL CONSULTATION NOTE   HISTORY OF PRESENT ILLNESS (HPI):  87 y.o. Lonnie [name] presented to [facility] ED for evaluation of abdominal pain for the last few days. Patient reports he usually d-glucose patient but this time the pain continued to get worse in the last 5 days.  Yesterday he was having no nausea or vomiting.  He is also feeling tired.  His pain is localized in the periumbilical area.  The pain sometimes radiates to her right abdomen.  He denies any fever or chills.  At the ED he was found with leukocytosis.  CT scan of the abdomen pelvis shows inflamed gallbladder with pericholecystic fluid.  Ultrasound also shows pericholecystic fluid and gallbladder with thickening.  I appreciate some sludge in the ultrasound.  I personally evaluated the images.  Surgery is consulted by Dr. Tobie Poet in this context for evaluation and management of acute cholecystitis.  PAST MEDICAL HISTORY (PMH):  Past Medical History:  Diagnosis Date  . Anemia   . Basal cell carcinoma 11/13/2009   Right alar groove  . Basal cell carcinoma 04/28/2010   left preauricular, BCC with focal sclerosis. Exc. 06/04/2010  . Basal cell carcinoma 04/24/2013   right post auricular  . Basal cell carcinoma 10/26/2017   right inferior cheek above mandible, Superficial  . Basal cell carcinoma 03/22/2018   right post. base of neck  . Basal cell carcinoma 05/08/2018   post. neck, spinal, Superficial. EDC 05/08/2018  . Basal cell carcinoma 07/23/2018   left mid supraclavicular, Superficial and nodular pattern. EDC  . Basal cell carcinoma 07/23/2018   right superior chest parasternal. Superficial and nodular pattern. EDC  . Basal cell carcinoma 05/16/2019   left lateral neck. Superficial and nodular pattern. EDC  . Basal cell carcinoma 11/07/2019   left lateral forehead. Nodular. EDC  . Basal cell carcinoma 12/23/2019   Left ala nasi. BCC with sclerosis.  . Basal cell carcinoma 11/19/2020   L ear post aspect,excised 12/01/20  . Bundle  branch block, left   . CKD (chronic kidney disease) stage 3, GFR 30-59 ml/min (HCC)   . COPD (chronic obstructive pulmonary disease) ([place])   . Coronary artery disease   . Family history of adverse reaction to anesthesia    DAUGHTER-N/V  . GERD (gastroesophageal reflux disease)   . History of asbestosis   . HLD (hyperlipidemia)   . Hypertension   . Pneumonia 2017  . PSA elevation      PAST SURGICAL HISTORY ([place]):  Past Surgical History:  Procedure Laterality Date  . APPENDECTOMY    . COLONOSCOPY WITH ESOPHAGOGASTRODUODENOSCOPY (EGD)    . CYSTOSCOPY WITH INSERTION OF UROLIFT N/A 10/06/2020   Procedure: CYSTOSCOPY WITH INSERTION OF UROLIFT;  Surgeon: Abbie Sons, MD;  Location: [facility] ORS;  Service: Urology;  Laterality: N/A;  . RECTAL SURGERY     x3 ABSCESS     MEDICATIONS:  Prior to Admission medications   Medication Sig Start Date End Date Taking? Authorizing Provider  alfuzosin (UROXATRAL) 10 MG 24 hr tablet Take 10 mg by mouth daily with breakfast.   Yes [provider]  allopurinol (ZYLOPRIM) 300 MG tablet Take 300 mg by mouth every morning.   Yes [provider]  aspirin 81 MG tablet Take 81 mg by mouth daily.   Yes [provider]  atorvastatin (LIPITOR) 40 MG tablet Take 40 mg by mouth every morning.   Yes [provider]  budesonide-formoterol (SYMBICORT) 160-4.5 MCG/ACT inhaler Inhale 2 puffs into the lungs 2 (two) times  daily.  01/31/18  Yes [provider]  ferrous sulfate 325 (65 FE) MG tablet Take 325 mg by mouth 2 (two) times daily with a meal.    Yes [provider]  finasteride (PROSCAR) 5 MG tablet Take 5 mg by mouth every morning.   Yes [provider]  gabapentin (NEURONTIN) 300 MG capsule Take 600 mg by mouth 2 (two) times daily.    Yes [provider]  Ipratropium-Albuterol (COMBIVENT RESPIMAT) 20-100 MCG/ACT AERS respimat Inhale 1 puff into the lungs every morning.   Yes [provider]  lisinopril (PRINIVIL,ZESTRIL) 40 MG tablet Take 40 mg by mouth daily.   Yes [provider]  Magnesium Oxide 250 MG TABS Take 500 mg by mouth every morning.   Yes [provider]  Melatonin 5 MG TABS Take 5 mg by mouth at bedtime.    Yes [provider]  NIFEdipine (PROCARDIA-XL/NIFEDICAL-XL) 30 MG 24 hr tablet Take 60 mg by mouth every morning.   Yes [provider]  Omega-3 1000 MG CAPS Take 1,000 mg by mouth daily.   Yes [provider]  omeprazole (PRILOSEC) 20 MG capsule Take 20 mg by mouth 2 (two) times daily.   Yes [provider]  vitamin B-12 (CYANOCOBALAMIN) 1000 MCG tablet Take 1,000 mcg by mouth daily.   Yes [provider]  vitamin C (ASCORBIC ACID) 250 MG tablet Take 250 mg by mouth 2 (two) times daily.   Yes [provider]  ondansetron (ZOFRAN ODT) 4 MG disintegrating tablet Take 1 tablet (4 mg total) by mouth every 8 (eight) hours as needed for nausea or vomiting. Patient not taking: Reported on 12/08/2020 10/06/20   Abbie Sons, MD  sennosides-docusate sodium (SENOKOT-S) 8.6-50 MG tablet Take 2 tablets by mouth daily.    [provider]     ALLERGIES:  Allergies  Allergen Reactions  . Sulfa Antibiotics Other (See Comments)    Not sure of reaction. It was too long ago, but he said not to give it to him.   . Sulfasalazine     Other reaction(s): Other (See Comments) Not sure of reaction. It was too long ago, but he said not to give it to him.   Marland Kitchen Penicillin G Rash     SOCIAL HISTORY:  Social History   Socioeconomic History  . Marital status: Widowed    Spouse name: Not on file  . Number of children: Not on file  . Years of education: Not on file  . Highest education level: Not on file  Occupational History  . Not on file  Tobacco Use  . Smoking status: Former Smoker    Packs/day: 2.00    Years: 50.00    Pack years: 100.00    Types: Cigarettes    Quit date:  09/24/1989    Years since quitting: 31.2  . Smokeless tobacco: Never Used  Vaping Use  . Vaping Use: Never used  Substance and Sexual Activity  . Alcohol use: Yes    Alcohol/week: 0.0 standard drinks    Comment: OCC RUM AND COKE  . Drug use: No  . Sexual activity: Not Currently  Other Topics Concern  . Not on file  Social History Narrative  . Not on file   Social Determinants of Health   Financial Resource Strain: Not on file  Food Insecurity: Not on file  Transportation Needs: Not on file  Physical Activity: Not on file  Stress: Not on file  Social Connections:  Not on file  Intimate Partner Violence: Not on file      FAMILY HISTORY:  Family History  Problem Relation Age of Onset  . Heart attack Mother   . Heart attack Father      REVIEW OF SYSTEMS:  Constitutional: denies weight loss, fever, chills, or sweats  Eyes: denies any other vision changes, history of eye injury  ENT: denies sore throat, hearing problems  Respiratory: denies shortness of breath, wheezing  Cardiovascular: denies chest pain, palpitations  Gastrointestinal: Positive abdominal pain, nausea and vomiting Genitourinary: denies burning with urination or urinary frequency Musculoskeletal: Positive joint pains, cramps  Skin: denies any other rashes or skin discolorations  Neurological: denies any other headache, dizziness, weakness  Psychiatric: denies any other depression, anxiety   All other review of systems were negative   VITAL SIGNS:  Temp:  [98.4 F (36.9 C)] 98.4 F (36.9 C) (03/08 0859) Pulse Rate:  [93-108] 93 (03/08 1200) Resp:  [16-19] 16 (03/08 1200) BP: (152-176)/(65-88) 155/65 (03/08 1200) SpO2:  [95 %-97 %] 95 % (03/08 1200) Weight:  [83 kg] 83 kg (03/08 0855)     Height: 5\' 6"  (167.6 cm) Weight: 83 kg BMI (Calculated): 29.55   INTAKE/OUTPUT:  This shift: Total I/O In: 600 [IV Piggyback:600] Out: -   Last 2 shifts: @IOLAST2SHIFTS @   PHYSICAL EXAM:  Constitutional:  --  Normal body habitus  -- Awake, alert, and oriented x3  Eyes:  -- Pupils equally round and reactive to light  -- No scleral icterus  Ear, nose, and throat:  -- No jugular venous distension  Pulmonary:  -- No crackles  -- Equal breath sounds bilaterally -- Breathing non-labored at rest Cardiovascular:  -- S1, S2 present  -- No pericardial rubs Gastrointestinal:  -- Abdomen soft, tender in right upper quadrant, non-distended, no guarding or rebound tenderness -- No abdominal masses appreciated, pulsatile or otherwise  Musculoskeletal and Integumentary:  -- Wounds: None appreciated -- Extremities: B/L UE and LE FROM, hands and feet warm, no edema  Neurologic:  -- Motor function: intact and symmetric -- Sensation: intact and symmetric   Labs:  CBC Latest Ref Rng & Units 12/08/2020 09/28/2020 08/13/2018  WBC 4.0 - 10.5 K/uL 21.3(H) 8.6 8.6  Hemoglobin 13.0 - 17.0 g/dL 12.3(L) 10.2(L) 10.0(L)  Hematocrit 39.0 - 52.0 % 36.0(L) 31.8(L) 30.1(L)  Platelets 150 - 400 K/uL 181 169 126(L)   CMP Latest Ref Rng & Units 12/08/2020 09/28/2020 05/30/2019  Glucose 70 - 99 mg/dL 101(H) 151(H) -  BUN 8 - 23 mg/dL 21 24(H) -  Creatinine 0.61 - 1.24 mg/dL 1.43(H) 1.30(H) 1.53(H)  Sodium 135 - 145 mmol/L 137 139 -  Potassium 3.5 - 5.1 mmol/L 4.2 3.7 -  Chloride 98 - 111 mmol/L 106 104 -  CO2 22 - 32 mmol/L 21(L) 25 -  Calcium 8.9 - 10.3 mg/dL 9.0 9.2 -  Total Protein 6.5 - 8.1 g/dL 7.1 - -  Total Bilirubin 0.3 - 1.2 mg/dL 0.9 - -  Alkaline Phos 38 - 126 U/L 89 - -  AST 15 - 41 U/L 23 - -  ALT 0 - 44 U/L 16 - -    Imaging studies:  EXAM: ULTRASOUND ABDOMEN LIMITED RIGHT UPPER QUADRANT  COMPARISON:  CT abdomen pelvis from same day.  FINDINGS: Gallbladder:  Gallbladder wall thickening and pericholecystic fluid. No gallstones. No sonographic Murphy sign noted by sonographer.  Common bile duct:  Diameter: 3 mm, normal.  Liver:  No focal lesion identified. Within normal limits  in  parenchymal echogenicity. Portal vein is patent on color Doppler imaging with normal direction of blood flow towards the liver.  Other: None.  IMPRESSION: 1. Gallbladder wall thickening and pericholecystic fluid without cholelithiasis, concerning for acalculus cholecystitis.   Electronically Signed   By: Titus Dubin M.D.   On: 12/08/2020 15:04  Assessment/Plan:  87 y.o. Lonnie [name] with acute cholecystitis, complicated by pertinent comorbidities including hypertension, chronic kidney disease stage III, COPD, coronary tree disease, hyperlipidemia.  Patient with severe acute cholecystitis.  Due to his advanced age and medical comorbidities and the severity of the cholecystitis I think that this patient is a high risk for general anesthesia and surgical management at this moment.  I would recommend to proceed with percutaneous cholecystectomy on IV antibiotic therapy.  I discussed the recommendation with the patient and his daughter and they agreed.  Appreciate hospitalist admission.  I will follow closely for the assistance of the management of his acute cholecystitis.   Arnold Long, MD

## 2020-12-08 NOTE — ED Notes (Signed)
Daughter at bedside. States that pt started taking ciprofloxacin and doxycycline 3d ago for L ear skin infx. Daughter states that this may have caused pt to have N/V last night. Pt vomited 4 times last night.

## 2020-12-08 NOTE — Progress Notes (Signed)
CODE SEPSIS - PHARMACY COMMUNICATION  **Broad Spectrum Antibiotics should be administered within 1 hour of Sepsis diagnosis**  Time Code Sepsis Called/Page Received: 1229  Antibiotics Ordered: ceftriaxone/metronidazole  Time of 1st antibiotic administration: 1223 (metronidazole ordered prior to Code Sepsis and already hanging)    Tawnya Crook ,PharmD Clinical Pharmacist  12/08/2020  12:30 PM

## 2020-12-08 NOTE — ED Notes (Signed)
Metronidazole was started before this nurse saw order for Ceftriaxone.

## 2020-12-08 NOTE — ED Notes (Signed)
Second lactic acid drawn now. Drawn 2 hours after 1st lactic acid draw (not overdue).

## 2020-12-08 NOTE — ED Notes (Signed)
Provided warm blanket and informed pt will be taken soon for CT scan.

## 2020-12-08 NOTE — Progress Notes (Signed)
Notified provider of need to order repeat lactic acid @ 1700.

## 2020-12-08 NOTE — ED Notes (Signed)
Lab called. Second lactic was 9.3. Dr Tobie Poet informed.

## 2020-12-08 NOTE — ED Notes (Signed)
Called lab to confirm that BMP will be run off previous collection sent to lab. They will run.

## 2020-12-08 NOTE — Progress Notes (Signed)
Notified provider of need to administer fluid bolus, pt needs 2490 cc of fluid.

## 2020-12-08 NOTE — ED Notes (Signed)
Spoke with Dr Tobie Poet face to face. She is ordering re-draw of lactic acid.

## 2020-12-08 NOTE — ED Notes (Signed)
Provided mouth swabs for pt dry mouth. Informed daughter that pt still NPO.

## 2020-12-08 NOTE — Sepsis Progress Note (Signed)
[facility]ink is following this code sepsis. Abx given before blood cultures obtained

## 2020-12-08 NOTE — ED Triage Notes (Signed)
Pt to ED via [facility] from  Pt c/o abdominal distention and constipation, LBM was 12/04/10 P daughter attempted digital disimpaction at home, did notwork Pt is tachycardic with EMS (110). CBG was 113 Pt has wound behind L ear, seen at [facility] [place] last week Pt has 20g IV L wrist  Pt in NAD EDP at bedside

## 2020-12-08 NOTE — H&P (Addendum)
History and Physical   Lonnie [name]. IRS:[id number] DOB: 1927-08-01 DOA: 12/08/2020  PCP: Derinda Late, MD  Outpatient Specialists: Dr. Sarina Ser, dermatology Patient coming from: Home  I have personally briefly reviewed patient's old medical records in [place].  Chief Concern: Abdominal pain  HPI: Lonnie [name]. is a 87 y.o. male with medical history significant for basal cell carcinoma status post skin excision, BPH, hypertension, CKD 3, COPD, coronary artery disease, hyperlipidemia, presented to the emergency department for chief concerns of abdominal pain.  Lonnie [name] reports the abdominal pain is sharp and achy and started about 2 days ago.  He denies fever and endorses chills.  He denies radiation.  He states that the pain is right upper quadrant and central abdominal.  He reports he has never felt this way before.  He reports that the pain has improved since being in the emergency department.  He denies dysuria, hematuria, chest pain, shortness of breath, vision changes, rashes.  He has chronic constipation and reports that his last bowel movement was 4 to 5 days ago.  Social History: He lives by himself.  He quit tobacco in his late 27s.  Formerly he smoked 3 ppd.  He endorses infrequent etoh once every three weeks.  He is retired and formerly worked as a Financial trader.    He reports that his last PO meal was noon/lunch 12/07/20.   Allergies: sulfer, unknown reaction  Vaccination: Patient endorses being fully vaccinated for COVID-19 including booster  ROS: Constitutional: no weight change, no fever; + chills ENT/Mouth: no sore throat, no rhinorrhea Eyes: no eye pain, no vision changes Cardiovascular: no chest pain, no dyspnea,  no edema, no palpitations Respiratory: no cough, no sputum, no wheezing Gastrointestinal: + nausea, no vomiting, no diarrhea, + constipation Genitourinary: no urinary incontinence, no dysuria, no  hematuria Musculoskeletal: no arthralgias, no myalgias Skin: no skin lesions, no pruritus, Neuro: + weakness, no loss of consciousness, no syncope Psych: no anxiety, no depression, + decrease appetite Heme/Lymph: no bruising, no bleeding  ED Course: Discussed with ED provider, patient requiring hospitalization due to acute cholecystitis.  Vitals in the ED was afebrile with temperature of 98.4, respiration rate of 20, heart rate of 92, blood pressure 155/65, satting at 97% on room air.  ED provider ordered a CT abdomen and pelvis with contrast which was read as distended gallbladder with gallbladder thickening and pericholestatic fluid.  Concerning for acute cholecystitis.  New nodular left lower lobe groundglass opacity possible pneumonia.  Colonic diverticulosis without findings of acute diverticulitis.  Moderate volume of formed stool throughout the colon.  An ultrasound of the abdomen limited to the right upper quadrant is pending.  ED provider consulted general surgery, Dr. Peyton Najjar.  Assessment/Plan  Principal Problem:   Cholecystitis Active Problems:   Pneumonia   BPH with obstruction/lower urinary tract symptoms   Stage 3 chronic kidney disease (HCC)   UTI (urinary tract infection)   Elevated prostate specific antigen (PSA)   Hyperlipidemia   Hypertension   Hypotonicity of bladder   Basal cell carcinoma (BCC) of left ear   Cholecystitis- Meets sepsis criteria with sinus tachycardia, elevated WBC, intra-abdominal source, elevated lactic acid -Right upper quadrant ultrasound read as a calculus cholecystitis -General surgery has been consulted -Continue antibiotics: Ceftriaxone IV, metronidazole IV -Blood cultures x2 and lactic ordered -Initial lactic acid was 1.2 and second lactic acid elevated at 9.3 thought to be possible error -Repeat lactic was 6.6, given that patient has  been taking MAP greater than 70 consistently at bedside and he has heart failure reduced ejection  fraction, I will not give full sepsis bolus -LR IVF at 125 mL/h to complete 8 hours  Possible aspiration pneumonia-azithromycin also for atypical coverage -Continue ceftriaxone and metronidazole  Hypertension-controlled -Resumed lisinopril 40 mg daily, nifedipine 60 mg in the a.m., for 12/07/2020  Hyperlipidemia-atorvastatin 40 mg daily resumed  Chronic constipation-scheduled mag citrate solution, 1 bottle once -GlycoLax nightly daily -Resumed Senokot and Dulcolax  Basal cell carcinoma s/p skin excision on 12/01/20 Possible cellulitis  - abx above - checking MRSA and if positive will change abx for MRSA coverage  Chronic gout-allopurinol 300 mg daily resumed  Chart reviewed.   07/16/2018: Complete echo without imaging enhancing agents read as EF of 35 to 40%, severe hypokinesis of the anteroseptal and anterior myocardium  DVT prophylaxis: TED hose, holding pharmacologic DVT prophylaxis for now pending general surgery note and plan Code Status: Full code Diet: NPO except for sips with meds and ice chips now and NPO after midnight, pending gen sx Family Communication: Updated daughter at bedside, [name] Disposition Plan: Pending clinical course Consults called: General surgery Admission status: MedSurg with telemetry, observation  Past Medical History:  Diagnosis Date  . Anemia   . Basal cell carcinoma 11/13/2009   Right alar groove  . Basal cell carcinoma 04/28/2010   left preauricular, BCC with focal sclerosis. Exc. 06/04/2010  . Basal cell carcinoma 04/24/2013   right post auricular  . Basal cell carcinoma 10/26/2017   right inferior cheek above mandible, Superficial  . Basal cell carcinoma 03/22/2018   right post. base of neck  . Basal cell carcinoma 05/08/2018   post. neck, spinal, Superficial. EDC 05/08/2018  . Basal cell carcinoma 07/23/2018   left mid supraclavicular, Superficial and nodular pattern. EDC  . Basal cell carcinoma 07/23/2018   right superior chest  parasternal. Superficial and nodular pattern. EDC  . Basal cell carcinoma 05/16/2019   left lateral neck. Superficial and nodular pattern. EDC  . Basal cell carcinoma 11/07/2019   left lateral forehead. Nodular. EDC  . Basal cell carcinoma 12/23/2019   Left ala nasi. BCC with sclerosis.  . Basal cell carcinoma 11/19/2020   L ear post aspect,excised 12/01/20  . Bundle branch block, left   . CKD (chronic kidney disease) stage 3, GFR 30-59 ml/min (HCC)   . COPD (chronic obstructive pulmonary disease) ([place])   . Coronary artery disease   . Family history of adverse reaction to anesthesia    DAUGHTER-N/V  . GERD (gastroesophageal reflux disease)   . History of asbestosis   . HLD (hyperlipidemia)   . Hypertension   . Pneumonia 2017  . PSA elevation    Past Surgical History:  Procedure Laterality Date  . APPENDECTOMY    . COLONOSCOPY WITH ESOPHAGOGASTRODUODENOSCOPY (EGD)    . CYSTOSCOPY WITH INSERTION OF UROLIFT N/A 10/06/2020   Procedure: CYSTOSCOPY WITH INSERTION OF UROLIFT;  Surgeon: Abbie Sons, MD;  Location: [facility] ORS;  Service: Urology;  Laterality: N/A;  . RECTAL SURGERY     x3 ABSCESS   Social History:  reports that he quit smoking about 31 years ago. His smoking use included cigarettes. He has a 100.00 pack-year smoking history. He has never used smokeless tobacco. He reports current alcohol use. He reports that he does not use drugs.  Allergies  Allergen Reactions  . Sulfa Antibiotics Other (See Comments)    Not sure of reaction. It was too long ago, but he  said not to give it to him.   . Sulfasalazine     Other reaction(s): Other (See Comments) Not sure of reaction. It was too long ago, but he said not to give it to him.   Marland Kitchen Penicillin G Rash   Family History  Problem Relation Age of Onset  . Heart attack Mother   . Heart attack Father    Family history: Family history reviewed and not pertinent  Prior to Admission medications   Medication Sig Start Date End  Date Taking? Authorizing Provider  alfuzosin (UROXATRAL) 10 MG 24 hr tablet Take 10 mg by mouth daily with breakfast.    [provider]  allopurinol (ZYLOPRIM) 300 MG tablet Take 300 mg by mouth every morning.    [provider]  aspirin 81 MG tablet Take 81 mg by mouth daily.    [provider]  atorvastatin (LIPITOR) 40 MG tablet Take 40 mg by mouth every morning.    [provider]  budesonide-formoterol (SYMBICORT) 160-4.5 MCG/ACT inhaler Inhale 2 puffs into the lungs 2 (two) times daily.  01/31/18   [provider]  ferrous sulfate 325 (65 FE) MG tablet Take 325 mg by mouth 2 (two) times daily with a meal.     [provider]  finasteride (PROSCAR) 5 MG tablet Take 5 mg by mouth every morning.    [provider]  gabapentin (NEURONTIN) 300 MG capsule Take 600 mg by mouth 2 (two) times daily.     [provider]  Ipratropium-Albuterol (COMBIVENT RESPIMAT) 20-100 MCG/ACT AERS respimat Inhale 1 puff into the lungs every morning.    [provider]  lisinopril (PRINIVIL,ZESTRIL) 40 MG tablet Take 40 mg by mouth daily.    [provider]  Magnesium Oxide 250 MG TABS Take 500 mg by mouth every morning.    [provider]  Melatonin 5 MG TABS Take 5 mg by mouth at bedtime.     [provider]  Multiple Vitamins-Minerals (MULTIVITAMIN WITH MINERALS) tablet Take 1 tablet by mouth daily.    [provider]  NIFEdipine (PROCARDIA-XL/NIFEDICAL-XL) 30 MG 24 hr tablet Take 30 mg by mouth every morning.    [provider]  Omega-3 1000 MG CAPS Take 1,000 mg by mouth daily.    [provider]  omeprazole (PRILOSEC) 20 MG capsule Take 20 mg by mouth 2 (two) times daily.    [provider]  ondansetron (ZOFRAN ODT) 4 MG disintegrating tablet Take 1 tablet (4 mg total) by mouth every 8 (eight) hours as needed for nausea or vomiting. 10/06/20   Stoioff, Ronda Fairly, MD   sennosides-docusate sodium (SENOKOT-S) 8.6-50 MG tablet Take 2 tablets by mouth daily.    [provider]  vitamin B-12 (CYANOCOBALAMIN) 1000 MCG tablet Take 1,000 mcg by mouth daily.    [provider]  vitamin C (ASCORBIC ACID) 250 MG tablet Take 250 mg by mouth 2 (two) times daily.    [provider]   Physical Exam: Vitals:   12/08/20 1400 12/08/20 1500 12/08/20 1530 12/08/20 1600  BP: (!) 156/67 (!) 162/69 (!) 156/68 (!) 156/70  Pulse: 88 85 85 87  Resp: 17 17 16 20   Temp:      TempSrc:      SpO2: 97% 98% 98% 97%  Weight:      Height:       Constitutional: appears age appropriate, NAD, calm, comfortable Eyes: PERRL, lids and conjunctivae normal ENMT: Mucous membranes are moist. Posterior  pharynx clear of any exudate or lesions. Age-appropriate dentition. Hearing appropriate Neck: normal, supple, no masses, no thyromegaly Respiratory: clear to auscultation bilaterally, no wheezing, no crackles. Normal respiratory effort. No accessory muscle use.  Cardiovascular: Regular rate and rhythm, no murmurs / rubs / gallops. No extremity edema. 2+ pedal pulses. No carotid bruits.  Abdomen: RUQ tenderness, no masses palpated, no hepatosplenomegaly. Bowel sounds positive.  Musculoskeletal: no clubbing / cyanosis. No joint deformity upper and lower extremities. Good ROM, no contractures, no atrophy. Normal muscle tone.  Skin: no rashes, lesions, ulcers. No induration Neurologic: Sensation intact. Strength 5/5 in all 4.  Psychiatric: Normal judgment and insight. Alert and oriented x 3. Normal mood.   EKG: independently reviewed, showing sinus tachycardia with heart rate of 108, QTc 470  Chest x-ray on Admission: I personally reviewed and I agree with radiologist reading as below.  CT ABDOMEN PELVIS W CONTRAST  Result Date: 12/08/2020 CLINICAL DATA:  Abdominal pain with distension constipation, bowel obstruction suspected. EXAM: CT ABDOMEN AND PELVIS WITH CONTRAST  TECHNIQUE: Multidetector CT imaging of the abdomen and pelvis was performed using the standard protocol following bolus administration of intravenous contrast. CONTRAST:  185mL OMNIPAQUE IOHEXOL 300 MG/ML  SOLN COMPARISON:  Chest CT March 06, 2019 FINDINGS: Lower chest: New nodular left lower lobe ground-glass opacity. Bibasilar bronchiectasis with areas of mucoid impaction. Bilateral calcified pleural plaques. Normal size heart. Coronary artery calcifications. Mitral annulus calcifications. No significant pericardial effusion or thickening. Hepatobiliary: No suspicious hepatic lesion. Gallbladder is distended with gallbladder wall thickening and pericholecystic fluid. No biliary ductal dilation. Pancreas: Unremarkable. Spleen: Unremarkable Adrenals/Urinary Tract: Bilateral adrenal glands are unremarkable. No hydronephrosis. Bilateral cortical renal scarring. Bilateral tiny subcentimeter hypodense renal lesions which are technically too small to accurately characterize but favored represent cysts. Postsurgical changes of urolift procedure. Mild trabecular thickening of the urinary bladder. Right posterior bladder diverticulum versus pseudo ureterocele on image 82/2. Stomach/Bowel: Stomach is decompressed limiting evaluation. Normal positioning of the duodenum/ligament of [place]. No evidence of bowel obstruction. The appendix is surgically absent per report. Moderate volume of formed stool throughout the colon. Colonic diverticulosis without findings of acute diverticulitis. Vascular/Lymphatic: Aortic atherosclerosis. No enlarged abdominal or pelvic lymph nodes. Reproductive: Prostate is present with changes of recent urolift procedure. Other: No abdominopelvic ascites. Musculoskeletal: Multilevel degenerative changes spine. Degenerative change of the bilateral hips. No acute osseous abnormality. IMPRESSION: 1. Distended gallbladder with gallbladder wall thickening and pericholecystic fluid. Findings are concerning for  acute cholecystitis. 2. New nodular left lower lobe ground-glass opacity may represent pneumonia recommend follow-up dedicated chest CT in 2-3 months to ensure resolution. 3. Postsurgical changes urolift insertion with nodular thickening of the adjacent right side pelvic musculature, which may be sequela of the procedure. Recommend attention on follow-up imaging. 4. Colonic diverticulosis without findings of acute diverticulitis. 5. Moderate volume of formed stool throughout the colon. 6. Aortic atherosclerosis.  Aortic Atherosclerosis (ICD10-I70.0). These results were called by telephone at the time of interpretation on 12/08/2020 at 11:04 am to provider MARK QUALE , who verbally acknowledged these results. Electronically Signed   By: Dahlia Bailiff MD   On: 12/08/2020 11:06   DG Abdomen Acute W/Chest  Result Date: 12/08/2020 CLINICAL DATA:  Distended abdomen EXAM: DG ABDOMEN ACUTE WITH 1 VIEW CHEST COMPARISON:  Chest x-ray 08/11/2018 FINDINGS: Heart size and vascularity normal. Negative for heart failure. Negative for pneumonia. Large calcified plaque overlying the left chest. Calcified pleural plaque also noted on the right. Mild bibasilar atelectasis. Normal bowel gas pattern.  Negative for bowel obstruction or ileus. No free air. No urinary tract calculi. Surgical clips in the region of the prostate. Mild degenerative change lumbar spine. IMPRESSION: Mild bibasilar atelectasis. Bilateral calcified pleural plaques, chronic Normal bowel gas pattern. Electronically Signed   By: Franchot Gallo M.D.   On: 12/08/2020 10:00   US ABDOMEN LIMITED RUQ (LIVER/GB)  Result Date: 12/08/2020 CLINICAL DATA:  Abdominal pain for the past day. EXAM: ULTRASOUND ABDOMEN LIMITED RIGHT UPPER QUADRANT COMPARISON:  CT abdomen pelvis from same day. FINDINGS: Gallbladder: Gallbladder wall thickening and pericholecystic fluid. No gallstones. No sonographic Murphy sign noted by sonographer. Common bile duct: Diameter: 3 mm, normal.  Liver: No focal lesion identified. Within normal limits in parenchymal echogenicity. Portal vein is patent on color Doppler imaging with normal direction of blood flow towards the liver. Other: None. IMPRESSION: 1. Gallbladder wall thickening and pericholecystic fluid without cholelithiasis, concerning for acalculus cholecystitis. Electronically Signed   By: Titus Dubin M.D.   On: 12/08/2020 15:04   Labs on Admission: I have personally reviewed following labs  CBC: Recent Labs  Lab 12/08/20 0901  WBC 21.3*  NEUTROABS 18.9*  HGB 12.3*  HCT 36.0*  MCV 97.8  PLT 836   Basic Metabolic Panel: Recent Labs  Lab 12/08/20 0901  NA 137  K 4.2  CL 106  CO2 21*  GLUCOSE 101*  BUN 21  CREATININE 1.43*  CALCIUM 9.0   GFR: Estimated Creatinine Clearance: 33.3 mL/min (A) (by C-G formula based on SCr of 1.43 mg/dL (H)).  Liver Function Tests: Recent Labs  Lab 12/08/20 0901  AST 23  ALT 16  ALKPHOS 89  BILITOT 0.9  PROT 7.1  ALBUMIN 3.7   Recent Labs  Lab 12/08/20 0901  LIPASE 35   Coagulation Profile: Recent Labs  Lab 12/08/20 0901  INR 1.1   Urine analysis:    Component Value Date/Time   COLORURINE YELLOW (A) 12/08/2020 0900   APPEARANCEUR CLEAR (A) 12/08/2020 0900   APPEARANCEUR Hazy (A) 09/28/2020 1313   LABSPEC 1.013 12/08/2020 0900   LABSPEC 1.017 09/29/2012 2058   PHURINE 8.0 12/08/2020 0900   GLUCOSEU 50 (A) 12/08/2020 0900   GLUCOSEU Negative 09/29/2012 2058   HGBUR SMALL (A) 12/08/2020 0900   BILIRUBINUR NEGATIVE 12/08/2020 0900   BILIRUBINUR Negative 09/28/2020 1313   BILIRUBINUR Negative 09/29/2012 2058   KETONESUR NEGATIVE 12/08/2020 0900   PROTEINUR 100 (A) 12/08/2020 0900   NITRITE NEGATIVE 12/08/2020 0900   LEUKOCYTESUR TRACE (A) 12/08/2020 0900   LEUKOCYTESUR Negative 09/29/2012 2058   Lonnie [name] D.O. [facility]  If 7PM-7AM, please contact overnight-coverage provider If 7AM-7PM, please contact day coverage  provider [email or web address]  12/08/2020, 4:29 PM

## 2020-12-09 ENCOUNTER — Telehealth: Payer: Self-pay

## 2020-12-09 ENCOUNTER — Observation Stay: Payer: No Typology Code available for payment source

## 2020-12-09 DIAGNOSIS — I447 Left bundle-branch block, unspecified: Secondary | ICD-10-CM | POA: Diagnosis present

## 2020-12-09 DIAGNOSIS — C44219 Basal cell carcinoma of skin of left ear and external auricular canal: Secondary | ICD-10-CM | POA: Diagnosis present

## 2020-12-09 DIAGNOSIS — K810 Acute cholecystitis: Secondary | ICD-10-CM | POA: Diagnosis present

## 2020-12-09 DIAGNOSIS — K5909 Other constipation: Secondary | ICD-10-CM | POA: Diagnosis present

## 2020-12-09 DIAGNOSIS — B952 Enterococcus as the cause of diseases classified elsewhere: Secondary | ICD-10-CM | POA: Diagnosis present

## 2020-12-09 DIAGNOSIS — N138 Other obstructive and reflux uropathy: Secondary | ICD-10-CM | POA: Diagnosis present

## 2020-12-09 DIAGNOSIS — K5730 Diverticulosis of large intestine without perforation or abscess without bleeding: Secondary | ICD-10-CM | POA: Diagnosis present

## 2020-12-09 DIAGNOSIS — N1832 Chronic kidney disease, stage 3b: Secondary | ICD-10-CM | POA: Diagnosis present

## 2020-12-09 DIAGNOSIS — M1A9XX0 Chronic gout, unspecified, without tophus (tophi): Secondary | ICD-10-CM | POA: Diagnosis present

## 2020-12-09 DIAGNOSIS — K819 Cholecystitis, unspecified: Secondary | ICD-10-CM | POA: Diagnosis not present

## 2020-12-09 DIAGNOSIS — N401 Enlarged prostate with lower urinary tract symptoms: Secondary | ICD-10-CM | POA: Diagnosis present

## 2020-12-09 DIAGNOSIS — A419 Sepsis, unspecified organism: Secondary | ICD-10-CM | POA: Diagnosis present

## 2020-12-09 DIAGNOSIS — I129 Hypertensive chronic kidney disease with stage 1 through stage 4 chronic kidney disease, or unspecified chronic kidney disease: Secondary | ICD-10-CM | POA: Diagnosis present

## 2020-12-09 DIAGNOSIS — K219 Gastro-esophageal reflux disease without esophagitis: Secondary | ICD-10-CM | POA: Diagnosis present

## 2020-12-09 DIAGNOSIS — I2510 Atherosclerotic heart disease of native coronary artery without angina pectoris: Secondary | ICD-10-CM | POA: Diagnosis present

## 2020-12-09 DIAGNOSIS — E785 Hyperlipidemia, unspecified: Secondary | ICD-10-CM | POA: Diagnosis present

## 2020-12-09 DIAGNOSIS — N1831 Chronic kidney disease, stage 3a: Secondary | ICD-10-CM | POA: Diagnosis present

## 2020-12-09 DIAGNOSIS — J9600 Acute respiratory failure, unspecified whether with hypoxia or hypercapnia: Secondary | ICD-10-CM | POA: Diagnosis not present

## 2020-12-09 DIAGNOSIS — N312 Flaccid neuropathic bladder, not elsewhere classified: Secondary | ICD-10-CM | POA: Diagnosis present

## 2020-12-09 DIAGNOSIS — J690 Pneumonitis due to inhalation of food and vomit: Secondary | ICD-10-CM | POA: Diagnosis present

## 2020-12-09 DIAGNOSIS — Z7982 Long term (current) use of aspirin: Secondary | ICD-10-CM | POA: Diagnosis not present

## 2020-12-09 DIAGNOSIS — Z20822 Contact with and (suspected) exposure to covid-19: Secondary | ICD-10-CM | POA: Diagnosis present

## 2020-12-09 DIAGNOSIS — N179 Acute kidney failure, unspecified: Secondary | ICD-10-CM | POA: Diagnosis not present

## 2020-12-09 DIAGNOSIS — D631 Anemia in chronic kidney disease: Secondary | ICD-10-CM | POA: Diagnosis present

## 2020-12-09 DIAGNOSIS — J810 Acute pulmonary edema: Secondary | ICD-10-CM | POA: Diagnosis not present

## 2020-12-09 LAB — MRSA PCR SCREENING: MRSA by PCR: NEGATIVE

## 2020-12-09 LAB — PROTIME-INR
INR: 1.2 (ref 0.8–1.2)
Prothrombin Time: 14.8 seconds (ref 11.4–15.2)

## 2020-12-09 LAB — SARS CORONAVIRUS 2 (TAT 6-24 HRS): SARS Coronavirus 2: NEGATIVE

## 2020-12-09 LAB — PROCALCITONIN: Procalcitonin: 0.31 ng/mL

## 2020-12-09 MED ORDER — FENTANYL CITRATE (PF) 100 MCG/2ML IJ SOLN
INTRAMUSCULAR | Status: AC | PRN
  Administered 2020-12-09: 50 ug via INTRAVENOUS
  Administered 2020-12-09 (×2): 25 ug via INTRAVENOUS

## 2020-12-09 MED ORDER — FENTANYL CITRATE (PF) 100 MCG/2ML IJ SOLN
INTRAMUSCULAR | Status: AC
  Filled 2020-12-09: qty 2

## 2020-12-09 MED ORDER — SODIUM CHLORIDE 0.9% FLUSH
5.0000 mL | Freq: Three times a day (TID) | INTRAVENOUS | Status: DC
  Administered 2020-12-09 – 2020-12-14 (×15): 5 mL

## 2020-12-09 MED ORDER — LACTATED RINGERS IV SOLN: INTRAVENOUS | Status: AC

## 2020-12-09 MED ORDER — SODIUM CHLORIDE 0.9 % IV SOLN
INTRAVENOUS | Status: AC | PRN
  Administered 2020-12-09: 10 mL/h via INTRAVENOUS

## 2020-12-09 MED ORDER — MIDAZOLAM HCL 5 MG/5ML IJ SOLN
INTRAMUSCULAR | Status: AC
  Filled 2020-12-09: qty 5

## 2020-12-09 MED ORDER — ZINC OXIDE 40 % EX OINT
TOPICAL_OINTMENT | CUTANEOUS | Status: DC | PRN
  Filled 2020-12-09: qty 113

## 2020-12-09 MED ORDER — HYDROCODONE-ACETAMINOPHEN 5-325 MG PO TABS
1.0000 | ORAL_TABLET | Freq: Four times a day (QID) | ORAL | Status: DC | PRN
  Administered 2020-12-09 – 2020-12-10 (×3): 2 via ORAL
  Administered 2020-12-10 – 2020-12-13 (×2): 1 via ORAL
  Filled 2020-12-09: qty 2
  Filled 2020-12-09: qty 1
  Filled 2020-12-09 (×2): qty 2
  Filled 2020-12-09: qty 1

## 2020-12-09 MED ORDER — MIDAZOLAM HCL 5 MG/5ML IJ SOLN
INTRAMUSCULAR | Status: AC | PRN
  Administered 2020-12-09: 0.5 mg via INTRAVENOUS

## 2020-12-09 NOTE — Telephone Encounter (Signed)
Patient's daughter called to let Dr. Nehemiah Massed know patient is currently admitted with gallbladder issues. Per daughter patient is having gallbladder drained today and patient has sepsis infection. Daughter was questioning if the patient's ear could be anything related to infection. You can see ER/hospital notes in chart.

## 2020-12-09 NOTE — Evaluation (Signed)
Physical Therapy Evaluation Patient Details Name: Lonnie [name]. MRN: [id number] DOB: 04/13/1927 Today's Date: 12/09/2020   History of Present Illness  Pt is a 87 y.o. male with acute cholecystitis, complicated by pertinent comorbidities including hypertension, chronic kidney disease stage III, COPD, coronary tree disease, hyperlipidemia. s/p percutaneous cholecystostomy 12/09/20.    Clinical Impression  Pt in bed, RN and family at bedside. Pt with increased pain due to drain placement, oriented x4. Premedicated for PT session. Pt reported at baseline he is independent/modI, lives alone but has neighbors/friends/family to check in as able. No falls in the last 6 months, utilized [facility] for household mobility, scooter for community ambulation.   The patient demonstrated UE strength WFLs, MMT of LEs limited due to pain but able to move against gravity without assist. Supine to sit with minA (due to elevated pain), and able to sit EOB for several minutes with good balance. Sit <> stand with RW and CGAx2 for safety. Able to take several steps forwards/backwards with RW, further ambulation declined by pt due to pain.  Overall the patient demonstrated deficits (see "PT Problem List") that impede the patient's functional abilities, safety, and mobility and would benefit from skilled PT intervention. Recommendation at this time is HHPT with supervision for mobility/OOB/intermittently pending pt progress.     Follow Up Recommendations Home health PT;Supervision for mobility/OOB;Supervision - Intermittent    Equipment Recommendations  Rolling walker with 5" wheels    Recommendations for Other Services       Precautions / Restrictions Precautions Precautions: Fall Precaution Comments: drain Restrictions Weight Bearing Restrictions: No      Mobility  Bed Mobility Overal bed mobility: Needs Assistance Bed Mobility: Supine to Sit;Sit to Supine     Supine to sit: Min assist;HOB elevated Sit to  supine: Min assist;HOB elevated   General bed mobility comments: minA provided for bed mobility due to pain, may not be required    Transfers Overall transfer level: Needs assistance Equipment used: Rolling walker (2 wheeled) Transfers: Sit to/from Stand Sit to Stand: Min guard         General transfer comment: CGAx2 for safety, cued for hand placement with RW  Ambulation/Gait Ambulation/Gait assistance: Min guard Gait Distance (Feet): 2 Feet Assistive device: Rolling walker (2 wheeled)       General Gait Details: Pt able to step fowards/backwards at EOB, declined further ambulation due to pain  Stairs            Wheelchair Mobility    Modified Rankin (Stroke Patients Only)       Balance Overall balance assessment: Needs assistance Sitting-balance support: Feet supported;Bilateral upper extremity supported Sitting balance-Leahy Scale: Good     Standing balance support: Bilateral upper extremity supported Standing balance-Leahy Scale: Fair                               Pertinent Vitals/Pain Pain Assessment: 0-10 Pain Score: 3  Pain Location: 3 sitting EOB, but with transitional movements and standing, exhibited significant pain levels Pain Descriptors / Indicators: Grimacing;Guarding;Moaning Pain Intervention(s): Limited activity within patient's tolerance;Monitored during session;Repositioned;Premedicated before session    Home Living Family/patient expects to be discharged to:: Private residence Living Arrangements: Alone Available Help at Discharge: Family;Neighbor;Available PRN/intermittently;Other (Comment) (daughter reported she would be able to stay with him a few days at discharge) Type of Home: House Home Access: Stairs to enter;Ramped entrance     Home Layout: One level Home  Equipment: Gilford Rile - 4 wheels;Cane - single point;Electric scooter Additional Comments: no falls in the last 6 months    Prior Function Level of  Independence: Independent with assistive device(s)         Comments: Pt uses SPC in home, electric scooter for community ambulation. has meals on wheels, able to prepare own meals.     Hand Dominance   Dominant Hand: Right    Extremity/Trunk Assessment   Upper Extremity Assessment Upper Extremity Assessment: Overall WFL for tasks assessed    Lower Extremity Assessment Lower Extremity Assessment:  (able to move LE's against gravity, limited by R sided flank pain)    Cervical / Trunk Assessment Cervical / Trunk Assessment: Other exceptions Cervical / Trunk Exceptions: R percutaneous  cholecystostomy  Communication   Communication: No difficulties  Cognition Arousal/Alertness: Awake/alert Behavior During Therapy: WFL for tasks assessed/performed Overall Cognitive Status: Within Functional Limits for tasks assessed                                        General Comments      Exercises     Assessment/Plan    PT Assessment Patient needs continued PT services  PT Problem List Decreased mobility;Decreased activity tolerance;Decreased balance;Decreased knowledge of use of DME;Pain       PT Treatment Interventions DME instruction;Therapeutic exercise;Gait training;Balance training;Neuromuscular re-education;Functional mobility training;Therapeutic activities;Patient/family education    PT Goals (Current goals can be found in the Care Plan section)  Acute Rehab PT Goals Patient Stated Goal: to decrease pain PT Goal Formulation: With patient Time For Goal Achievement: 12/23/20 Potential to Achieve Goals: Good    Frequency Min 2X/week   Barriers to discharge        Co-evaluation               AM-PAC PT "6 Clicks" Mobility  Outcome Measure Help needed turning from your back to your side while in a flat bed without using bedrails?: A Little Help needed moving from lying on your back to sitting on the side of a flat bed without using bedrails?:  A Little Help needed moving to and from a bed to a chair (including a wheelchair)?: A Little Help needed standing up from a chair using your arms (e.g., wheelchair or bedside chair)?: A Little Help needed to walk in hospital room?: A Little Help needed climbing 3-5 steps with a railing? : A Lot 6 Click Score: 17    End of Session Equipment Utilized During Treatment: Gait belt Activity Tolerance: Patient limited by pain Patient left: in bed;with call bell/phone within reach;with bed alarm set;with family/visitor present Nurse Communication: Mobility status PT Visit Diagnosis: Other abnormalities of gait and mobility (R26.89);Muscle weakness (generalized) (M62.81);Pain Pain - Right/Left: Right Pain - part of body:  (flank)    Time: 4970-2637 PT Time Calculation (min) (ACUTE ONLY): 28 min   Charges:   PT Evaluation $PT Eval Low Complexity: 1 Low PT Treatments $Therapeutic Exercise: 8-22 mins        Lieutenant Diego PT, DPT 4:27 PM,12/09/20

## 2020-12-09 NOTE — Telephone Encounter (Signed)
The ear surgery for [facility] should not be related to any gallbladder problem or sepsis.

## 2020-12-09 NOTE — Procedures (Signed)
Pre procedural Dx: Acute Cholelithiasis, poor operative candidate. Post procedural Dx: Same  Technically successful [place] and CT guided placed of a 10 Fr drainage catheter placement into the gallbladder for acute cholecystitis. Sample of aspirated bile sent to lab for analysis. Chole tube connected to gravity bag.   EBL: Minimal  Complications: None immediate  Ronny Bacon, MD Pager #: [phone]

## 2020-12-09 NOTE — Consult Note (Signed)
Chief Complaint: Acute cholecysitis  Referring Physician(s): Cintron-Diaz  Patient Status: [facility] - In-pt  History of Present Illness: Lonnie [name]. is a 87 y.o. male with past medical history significant for chronic kidney disease, COPD, hypertension and hyperlipidemia who presented to the [facility] emergency department with right upper quadrant abdominal pain with CT scan and right upper quadrant abdominal ultrasound worrisome for acute cholecystitis.  Given the patient's advanced age and multiple medical comorbidities, request made for image guided placement of the cholecystostomy tube for infection source control purposes.  Patient continues to complain of right upper quadrant abdominal pain which radiates to his back.  He is otherwise without complaint.  Specifically, no chest pain or shortness of breath.  No fever or chills.   Past Medical History:  Diagnosis Date  . Anemia   . Basal cell carcinoma 11/13/2009   Right alar groove  . Basal cell carcinoma 04/28/2010   left preauricular, BCC with focal sclerosis. Exc. 06/04/2010  . Basal cell carcinoma 04/24/2013   right post auricular  . Basal cell carcinoma 10/26/2017   right inferior cheek above mandible, Superficial  . Basal cell carcinoma 03/22/2018   right post. base of neck  . Basal cell carcinoma 05/08/2018   post. neck, spinal, Superficial. EDC 05/08/2018  . Basal cell carcinoma 07/23/2018   left mid supraclavicular, Superficial and nodular pattern. EDC  . Basal cell carcinoma 07/23/2018   right superior chest parasternal. Superficial and nodular pattern. EDC  . Basal cell carcinoma 05/16/2019   left lateral neck. Superficial and nodular pattern. EDC  . Basal cell carcinoma 11/07/2019   left lateral forehead. Nodular. EDC  . Basal cell carcinoma 12/23/2019   Left ala nasi. BCC with sclerosis.  . Basal cell carcinoma 11/19/2020   L ear post aspect,excised 12/01/20  . Bundle branch  block, left   . CKD (chronic kidney disease) stage 3, GFR 30-59 ml/min (HCC)   . COPD (chronic obstructive pulmonary disease) ([place])   . Coronary artery disease   . Family history of adverse reaction to anesthesia    DAUGHTER-N/V  . GERD (gastroesophageal reflux disease)   . History of asbestosis   . HLD (hyperlipidemia)   . Hypertension   . Pneumonia 2017  . PSA elevation     Past Surgical History:  Procedure Laterality Date  . APPENDECTOMY    . COLONOSCOPY WITH ESOPHAGOGASTRODUODENOSCOPY (EGD)    . CYSTOSCOPY WITH INSERTION OF UROLIFT N/A 10/06/2020   Procedure: CYSTOSCOPY WITH INSERTION OF UROLIFT;  Surgeon: Abbie Sons, MD;  Location: [facility] ORS;  Service: Urology;  Laterality: N/A;  . RECTAL SURGERY     x3 ABSCESS    Allergies: Sulfa antibiotics, Sulfasalazine, and Penicillin g  Medications: Prior to Admission medications   Medication Sig Start Date End Date Taking? Authorizing Provider  alfuzosin (UROXATRAL) 10 MG 24 hr tablet Take 10 mg by mouth daily with breakfast.   Yes [provider]  allopurinol (ZYLOPRIM) 300 MG tablet Take 300 mg by mouth every morning.   Yes [provider]  aspirin 81 MG tablet Take 81 mg by mouth daily.   Yes [provider]  atorvastatin (LIPITOR) 40 MG tablet Take 40 mg by mouth every morning.   Yes [provider]  bisacodyl (DULCOLAX) 5 MG EC tablet Take 10 mg by mouth 2 (two) times daily as needed. 09/18/20  Yes [provider]  budesonide-formoterol (SYMBICORT) 160-4.5 MCG/ACT inhaler Inhale 2 puffs into the lungs 2 (two)  times daily.  01/31/18  Yes [provider]  ciprofloxacin (CIPRO) 500 MG tablet Take 500 mg by mouth 2 (two) times daily. 12/06/20  Yes [provider]  doxycycline (VIBRAMYCIN) 100 MG capsule Take 100 mg by mouth 2 (two) times daily. 12/06/20  Yes [provider]  ferrous sulfate 325 (65 FE) MG tablet Take 325 mg by mouth 2 (two) times daily with a meal.     Yes [provider]  finasteride (PROSCAR) 5 MG tablet Take 5 mg by mouth every morning.   Yes [provider]  gabapentin (NEURONTIN) 300 MG capsule Take 600 mg by mouth 2 (two) times daily.    Yes [provider]  Ipratropium-Albuterol (COMBIVENT RESPIMAT) 20-100 MCG/ACT AERS respimat Inhale 1 puff into the lungs every morning.   Yes [provider]  lisinopril (PRINIVIL,ZESTRIL) 40 MG tablet Take 40 mg by mouth daily.   Yes [provider]  Magnesium Oxide 250 MG TABS Take 500 mg by mouth every morning.   Yes [provider]  Melatonin 5 MG TABS Take 5 mg by mouth at bedtime.    Yes [provider]  NIFEdipine (PROCARDIA-XL/NIFEDICAL-XL) 30 MG 24 hr tablet Take 60 mg by mouth every morning.   Yes [provider]  Omega-3 1000 MG CAPS Take 1,000 mg by mouth daily.   Yes [provider]  omeprazole (PRILOSEC) 20 MG capsule Take 20 mg by mouth 2 (two) times daily.   Yes [provider]  vitamin B-12 (CYANOCOBALAMIN) 1000 MCG tablet Take 1,000 mcg by mouth daily.   Yes [provider]  vitamin C (ASCORBIC ACID) 250 MG tablet Take 250 mg by mouth 2 (two) times daily.   Yes [provider]  ondansetron (ZOFRAN ODT) 4 MG disintegrating tablet Take 1 tablet (4 mg total) by mouth every 8 (eight) hours as needed for nausea or vomiting. Patient not taking: No sig reported 10/06/20   Abbie Sons, MD  sennosides-docusate sodium (SENOKOT-S) 8.6-50 MG tablet Take 2 tablets by mouth daily.    [provider]     Family History  Problem Relation Age of Onset  . Heart attack Mother   . Heart attack Father     Social History   Socioeconomic History  . Marital status: Widowed    Spouse name: Not on file  . Number of children: Not on file  . Years of education: Not on file  . Highest education level: Not on file  Occupational History  . Not on file  Tobacco Use  . Smoking status:  Former Smoker    Packs/day: 2.00    Years: 50.00    Pack years: 100.00    Types: Cigarettes    Quit date: 09/24/1989    Years since quitting: 31.2  . Smokeless tobacco: Never Used  Vaping Use  . Vaping Use: Never used  Substance and Sexual Activity  . Alcohol use: Yes    Alcohol/week: 0.0 standard drinks    Comment: OCC RUM AND COKE  . Drug use: No  . Sexual activity: Not Currently  Other Topics Concern  . Not on file  Social History Narrative  . Not on file   Social Determinants of Health   Financial Resource Strain: Not on file  Food Insecurity: Not on file  Transportation Needs: Not on file  Physical Activity: Not on file  Stress: Not on file  Social Connections: Not on file    ECOG Status: 1 - Symptomatic but  completely ambulatory  Review of Systems: A 12 point ROS discussed and pertinent positives are indicated in the HPI above.  All other systems are negative.  Review of Systems  Vital Signs: BP (!) 162/64 (BP Location: Right Arm)   Pulse 79   Temp 98.3 F (36.8 C) (Oral)   Resp 16   Ht 5\' 7"  (1.702 m)   Wt 82.1 kg   SpO2 96%   BMI 28.35 kg/m   Physical Exam  Imaging: CT ABDOMEN PELVIS W CONTRAST  Result Date: 12/08/2020 CLINICAL DATA:  Abdominal pain with distension constipation, bowel obstruction suspected. EXAM: CT ABDOMEN AND PELVIS WITH CONTRAST TECHNIQUE: Multidetector CT imaging of the abdomen and pelvis was performed using the standard protocol following bolus administration of intravenous contrast. CONTRAST:  133mL OMNIPAQUE IOHEXOL 300 MG/ML  SOLN COMPARISON:  Chest CT March 06, 2019 FINDINGS: Lower chest: New nodular left lower lobe ground-glass opacity. Bibasilar bronchiectasis with areas of mucoid impaction. Bilateral calcified pleural plaques. Normal size heart. Coronary artery calcifications. Mitral annulus calcifications. No significant pericardial effusion or thickening. Hepatobiliary: No suspicious hepatic lesion. Gallbladder is distended  with gallbladder wall thickening and pericholecystic fluid. No biliary ductal dilation. Pancreas: Unremarkable. Spleen: Unremarkable Adrenals/Urinary Tract: Bilateral adrenal glands are unremarkable. No hydronephrosis. Bilateral cortical renal scarring. Bilateral tiny subcentimeter hypodense renal lesions which are technically too small to accurately characterize but favored represent cysts. Postsurgical changes of urolift procedure. Mild trabecular thickening of the urinary bladder. Right posterior bladder diverticulum versus pseudo ureterocele on image 82/2. Stomach/Bowel: Stomach is decompressed limiting evaluation. Normal positioning of the duodenum/ligament of [place]. No evidence of bowel obstruction. The appendix is surgically absent per report. Moderate volume of formed stool throughout the colon. Colonic diverticulosis without findings of acute diverticulitis. Vascular/Lymphatic: Aortic atherosclerosis. No enlarged abdominal or pelvic lymph nodes. Reproductive: Prostate is present with changes of recent urolift procedure. Other: No abdominopelvic ascites. Musculoskeletal: Multilevel degenerative changes spine. Degenerative change of the bilateral hips. No acute osseous abnormality. IMPRESSION: 1. Distended gallbladder with gallbladder wall thickening and pericholecystic fluid. Findings are concerning for acute cholecystitis. 2. New nodular left lower lobe ground-glass opacity may represent pneumonia recommend follow-up dedicated chest CT in 2-3 months to ensure resolution. 3. Postsurgical changes urolift insertion with nodular thickening of the adjacent right side pelvic musculature, which may be sequela of the procedure. Recommend attention on follow-up imaging. 4. Colonic diverticulosis without findings of acute diverticulitis. 5. Moderate volume of formed stool throughout the colon. 6. Aortic atherosclerosis.  Aortic Atherosclerosis (ICD10-I70.0). These results were called by telephone at the time of  interpretation on 12/08/2020 at 11:04 am to provider MARK QUALE , who verbally acknowledged these results. Electronically Signed   By: Dahlia Bailiff MD   On: 12/08/2020 11:06   DG Abdomen Acute W/Chest  Result Date: 12/08/2020 CLINICAL DATA:  Distended abdomen EXAM: DG ABDOMEN ACUTE WITH 1 VIEW CHEST COMPARISON:  Chest x-ray 08/11/2018 FINDINGS: Heart size and vascularity normal. Negative for heart failure. Negative for pneumonia. Large calcified plaque overlying the left chest. Calcified pleural plaque also noted on the right. Mild bibasilar atelectasis. Normal bowel gas pattern. Negative for bowel obstruction or ileus. No free air. No urinary tract calculi. Surgical clips in the region of the prostate. Mild degenerative change lumbar spine. IMPRESSION: Mild bibasilar atelectasis. Bilateral calcified pleural plaques, chronic Normal bowel gas pattern. Electronically Signed   By: Franchot Gallo M.D.   On: 12/08/2020 10:00   US ABDOMEN LIMITED RUQ (LIVER/GB)  Result Date: 12/08/2020 CLINICAL DATA:  Abdominal pain for the past day. EXAM: ULTRASOUND ABDOMEN LIMITED RIGHT UPPER QUADRANT COMPARISON:  CT abdomen pelvis from same day. FINDINGS: Gallbladder: Gallbladder wall thickening and pericholecystic fluid. No gallstones. No sonographic Murphy sign noted by sonographer. Common bile duct: Diameter: 3 mm, normal. Liver: No focal lesion identified. Within normal limits in parenchymal echogenicity. Portal vein is patent on color Doppler imaging with normal direction of blood flow towards the liver. Other: None. IMPRESSION: 1. Gallbladder wall thickening and pericholecystic fluid without cholelithiasis, concerning for acalculus cholecystitis. Electronically Signed   By: Titus Dubin M.D.   On: 12/08/2020 15:04    Labs:  CBC: Recent Labs    09/28/20 1355 12/08/20 0901  WBC 8.6 21.3*  HGB 10.2* 12.3*  HCT 31.8* 36.0*  PLT 169 181    COAGS: Recent Labs    12/08/20 0901 12/09/20 0553  INR 1.1 1.2     BMP: Recent Labs    09/28/20 1355 12/08/20 0901  NA 139 137  K 3.7 4.2  CL 104 106  CO2 25 21*  GLUCOSE 151* 101*  BUN 24* 21  CALCIUM 9.2 9.0  CREATININE 1.30* 1.43*  GFRNONAA 52* 46*    LIVER FUNCTION TESTS: Recent Labs    12/08/20 0901  BILITOT 0.9  AST 23  ALT 16  ALKPHOS 89  PROT 7.1  ALBUMIN 3.7    TUMOR MARKERS: No results for input(s): AFPTM, CEA, CA199, CHROMGRNA in the last 8760 hours.  Assessment and Plan:  Lonnie [name]. is a 87 y.o. male with past medical history significant for chronic kidney disease, COPD, hypertension and hyperlipidemia who presented to the [facility] emergency department with right upper quadrant abdominal pain with CT scan and right upper quadrant abdominal ultrasound worrisome for acute cholecystitis.  Given the patient's advanced age and multiple medical comorbidities, request made for image guided placement of the cholecystostomy tube for infection source control purposes.  Patient continues to complain of right upper quadrant abdominal pain which radiates to his back.  He is otherwise without complaint.    Risks and benefits of image guided cholecystomy tube placement was discussed with the patient including, but not limited to bleeding, infection, gallbladder perforation, bile leak, sepsis or even death.  All of the patient's questions were answered, patient is agreeable to proceed. Consent signed and in chart.  Thank you for this interesting consult.  I greatly enjoyed meeting Wanda A Brody Bonneau. and look forward to participating in their care.  A copy of this report was sent to the requesting provider on this date.  Electronically Signed: Sandi Mariscal, MD 12/09/2020, 11:29 AM   I spent a total of 20 Minutes in face to face in clinical consultation, greater than 50% of which was counseling/coordinating care for image guided cholecystomy tube placement.

## 2020-12-09 NOTE — Plan of Care (Signed)
Pt arrived unit from the ED alert and oriented, on room air, afebrile. Pt denied any concerns for pain, nausea, SOB or headache. Pt NPO midnight per order. Greenish- brown BM this shift. Call bell within reach, falls precautions in place. Problem: Activity: Goal: Risk for activity intolerance will decrease Outcome: Progressing   Problem: Nutrition: Goal: Adequate nutrition will be maintained Outcome: Progressing   Problem: Coping: Goal: Level of anxiety will decrease Outcome: Progressing   Problem: Elimination: Goal: Will not experience complications related to urinary retention Outcome: Progressing

## 2020-12-09 NOTE — Progress Notes (Signed)
Patient ID: Lonnie Sermons., male   DOB: 1927-03-19, 87 y.o.   MRN: [id number]     [facility] Day(s): 0.   Post op day(s):  Marland Kitchen   Interval History: Patient seen and examined, no acute events or new complaints overnight. Patient reports feeling okay.  Denies any worsening abdominal pain.  Denies any nausea or vomitings since last night.  Denies any fever or chills.  Pain still localized in the right upper quadrant.  Pain radiates to his back.  Alleviating factor has been pain medication.  There is no aggravating factors.  Vital signs in last 24 hours: [min-max] current  Temp:  [97.6 F (36.4 C)-98.6 F (37 C)] 98.3 F (36.8 C) (03/09 0747) Pulse Rate:  [79-108] 79 (03/09 0747) Resp:  [16-22] 16 (03/09 0747) BP: (152-178)/(64-88) 162/64 (03/09 0747) SpO2:  [95 %-99 %] 96 % (03/09 0747) Weight:  [82.1 kg-83 kg] 82.1 kg (03/08 2051)     Height: 5\' 7"  (170.2 cm) Weight: 82.1 kg BMI (Calculated): 28.34   Physical Exam:  Constitutional: alert, cooperative and no distress  Respiratory: breathing non-labored at rest  Cardiovascular: regular rate and sinus rhythm  Gastrointestinal: soft, tender in the right upper quadrant, and non-distended  Labs:  CBC Latest Ref Rng & Units 12/08/2020 09/28/2020 08/13/2018  WBC 4.0 - 10.5 K/uL 21.3(H) 8.6 8.6  Hemoglobin 13.0 - 17.0 g/dL 12.3(L) 10.2(L) 10.0(L)  Hematocrit 39.0 - 52.0 % 36.0(L) 31.8(L) 30.1(L)  Platelets 150 - 400 K/uL 181 169 126(L)   CMP Latest Ref Rng & Units 12/08/2020 09/28/2020 05/30/2019  Glucose 70 - 99 mg/dL 101(H) 151(H) -  BUN 8 - 23 mg/dL 21 24(H) -  Creatinine 0.61 - 1.24 mg/dL 1.43(H) 1.30(H) 1.53(H)  Sodium 135 - 145 mmol/L 137 139 -  Potassium 3.5 - 5.1 mmol/L 4.2 3.7 -  Chloride 98 - 111 mmol/L 106 104 -  CO2 22 - 32 mmol/L 21(L) 25 -  Calcium 8.9 - 10.3 mg/dL 9.0 9.2 -  Total Protein 6.5 - 8.1 g/dL 7.1 - -  Total Bilirubin 0.3 - 1.2 mg/dL 0.9 - -  Alkaline Phos 38 - 126 U/L 89 - -  AST 15 - 41 U/L  23 - -  ALT 0 - 44 U/L 16 - -    Imaging studies: No new pertinent imaging studies   Assessment/Plan:  87 y.o. male with acute cholecystitis, complicated by pertinent comorbidities including hypertension, chronic kidney disease stage III, COPD, coronary tree disease, hyperlipidemia.  Patient with severe cholecystitis.  I discussed the case with IR to see if they can place percutaneous cholecystostomy.  I think that this will be the safer way to treat his cholecystitis at this moment due to the severity of the cholecystitis and the patient status.  Patient should remain n.p.o. until the procedure is done.  Otherwise after the procedure I think it is reasonable to start with clear liquid diet and assess for toleration.  Agree to continue IV antibiotic therapy.  I will continue to follow closely.  Arnold Long, MD

## 2020-12-09 NOTE — Sedation Documentation (Signed)
Dr. Pascal Lux in [place] speaking with pt. Re: procedure. Consent obtained now at bedside.

## 2020-12-09 NOTE — Progress Notes (Signed)
Progress Note    Lonnie [name].  WYO:[id number] DOB: 08-Feb-1927  DOA: 12/08/2020 PCP: Derinda Late, MD    Brief Narrative:     Medical records reviewed and are as summarized below:  Lonnie [name]. is an 87 y.o. male with medical history significant for basal cell carcinoma status post skin excision, BPH, hypertension, CKD 3, COPD, coronary artery disease, hyperlipidemia, presented to the emergency department for chief concerns of abdominal pain.  Found to have cholecystitis and plan is for perc drain.  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   Cholecystitis- Meets sepsis criteria with sinus tachycardia, elevated WBC, intra-abdominal source, elevated lactic acid -Right upper quadrant ultrasound read as acalculus cholecystitis -General surgery consult appreciated: plan IR consult for perc drain- culture sent -Continue antibiotics -Blood cultures x2 -trend lactic acid -gentle IVF  Hypertension-controlled -Resumed lisinopril 40 mg daily, nifedipine 60 mg   Hyperlipidemia -atorvastatin 40 mg daily resumed  Chronic constipation -GlycoLax nightly daily -Resumed Senokot and Dulcolax  Basal cell carcinoma s/p skin excision on 12/01/20 Possible cellulitis  - abx above  Chronic gout-allopurinol 300 mg daily resumed   Family Communication/Anticipated D/C date and plan/Code Status   DVT prophylaxis: scd Code Status: Full Code.  Disposition Plan: Status is: Observation  The patient will require care spanning > 2 midnights and should be moved to inpatient because: IV treatments appropriate due to intensity of illness or inability to take PO  Dispo:  The patient is from: Home              Anticipated d/c is to: Home              Patient currently is not medically stable to d/c.   Difficult to place patient No         Medical Consultants:    IR  GS     Subjective:   Upset his call-bell was not answered in a fast manner  Objective:    Vitals:   12/08/20 2051 12/08/20 2325 12/09/20 0329 12/09/20 0747  BP: (!) 160/72 (!) 178/74 (!) 166/71 (!) 162/64  Pulse: 88 84 83 79  Resp: 20 18 18 16   Temp: 97.6 F (36.4 C) 97.7 F (36.5 C) 98.4 F (36.9 C) 98.3 F (36.8 C)  TempSrc:  Oral  Oral  SpO2: 97% 96% 97% 96%  Weight: 82.1 kg     Height: 5\' 7"  (1.702 m)       Intake/Output Summary (Last 24 hours) at 12/09/2020 1054 Last data filed at 12/09/2020 0900 Gross per 24 hour  Intake 950 ml  Output 900 ml  Net 50 ml   Filed Weights   12/08/20 0855 12/08/20 2051  Weight: 83 kg 82.1 kg    Exam:  General: Appearance:     Overweight male in no acute distress     Lungs:     respirations unlabored  Heart:    Normal heart rate. Normal rhythm. No murmurs, rubs, or gallops.   MS:   All extremities are intact.   Neurologic:   Awake, alert, oriented x 3.    Data Reviewed:   I have personally reviewed following labs  and imaging studies:  Labs: Labs show the following:   Basic Metabolic Panel: Recent Labs  Lab 12/08/20 0901  NA 137  K 4.2  CL 106  CO2 21*  GLUCOSE 101*  BUN 21  CREATININE 1.43*  CALCIUM 9.0   GFR Estimated Creatinine Clearance: 33.8 mL/min (A) (by C-G formula based on SCr of 1.43 mg/dL (H)). Liver Function Tests: Recent Labs  Lab 12/08/20 0901  AST 23  ALT 16  ALKPHOS 89  BILITOT 0.9  PROT 7.1  ALBUMIN 3.7   Recent Labs  Lab 12/08/20 0901  LIPASE 35   No results for input(s): AMMONIA in the last 168 hours. Coagulation profile Recent Labs  Lab 12/08/20 0901 12/09/20 0553  INR 1.1 1.2    CBC: Recent Labs  Lab 12/08/20 0901  WBC 21.3*  NEUTROABS 18.9*  HGB 12.3*   HCT 36.0*  MCV 97.8  PLT 181   Cardiac Enzymes: No results for input(s): CKTOTAL, CKMB, CKMBINDEX, TROPONINI in the last 168 hours. BNP (last 3 results) No results for input(s): PROBNP in the last 8760 hours. CBG: No results for input(s): GLUCAP in the last 168 hours. D-Dimer: No results for input(s): DDIMER in the last 72 hours. Hgb A1c: No results for input(s): HGBA1C in the last 72 hours. Lipid Profile: No results for input(s): CHOL, HDL, LDLCALC, TRIG, CHOLHDL, LDLDIRECT in the last 72 hours. Thyroid function studies: No results for input(s): TSH, T4TOTAL, T3FREE, THYROIDAB in the last 72 hours.  Invalid input(s): FREET3 Anemia work up: No results for input(s): VITAMINB12, FOLATE, FERRITIN, TIBC, IRON, RETICCTPCT in the last 72 hours. Sepsis Labs: Recent Labs  Lab 12/08/20 0901 12/08/20 1312 12/08/20 1510 12/08/20 1610 12/09/20 0553  PROCALCITON  --   --   --   --  0.31  WBC 21.3*  --   --   --   --   LATICACIDVEN  --  1.2 9.3* 6.6*  --     Microbiology Recent Results (from the past 240 hour(s))  SARS CORONAVIRUS 2 (TAT 6-24 HRS) Nasopharyngeal Nasopharyngeal Swab     Status: None   Collection Time: 12/08/20  1:00 PM   Specimen: Nasopharyngeal Swab  Result Value Ref Range Status   SARS Coronavirus 2 NEGATIVE NEGATIVE Final    Comment: (NOTE) SARS-CoV-2 target nucleic acids are NOT DETECTED.  The SARS-CoV-2 RNA is generally detectable in upper and lower respiratory specimens during the acute phase of infection. Negative results do not preclude SARS-CoV-2 infection, do not rule out co-infections with other pathogens, and should not be used as the sole basis for treatment or other patient management decisions. Negative results must be combined with clinical observations, patient history, and epidemiological information. The expected result is Negative.  Fact Sheet for Patients: SugarRoll.be  Fact Sheet for Healthcare  Providers: https://www.woods-mathews.com/  This test is not yet approved or cleared by the [place] FDA and  has been authorized for detection and/or diagnosis of SARS-CoV-2 by FDA under an Emergency Use Authorization (EUA). This EUA will remain  in effect (meaning this test can be used) for the duration of the COVID-19 declaration under Se ction 564(b)(1) of the Act, 21 U.S.C. section 360bbb-3(b)(1), unless the authorization is terminated or revoked sooner.  Performed at [facility], [place] [address] [place], [place] [address]   Culture, blood (Routine X 2) w Reflex to ID Panel     Status: None (Preliminary result)   Collection Time: 12/08/20  1:00 PM   Specimen: BLOOD  Result Value Ref Range Status   Specimen Description BLOOD BLOOD LEFT WRIST  Final   Special Requests   Final    BOTTLES DRAWN AEROBIC AND ANAEROBIC Blood Culture adequate volume   Culture   Final    NO GROWTH < 24 HOURS Performed at [facility], [address] [place], [place] [address]    Report Status PENDING  Incomplete  Culture, blood (Routine X 2) w Reflex to ID Panel     Status: None (Preliminary result)   Collection Time: 12/08/20  1:12 PM   Specimen: BLOOD  Result Value Ref Range Status   Specimen Description BLOOD LEFT ANTECUBITAL  Final   Special Requests   Final    BOTTLES DRAWN AEROBIC AND ANAEROBIC Blood Culture results may not be optimal due to an excessive volume of blood received in culture bottles   Culture   Final    NO GROWTH < 24 HOURS Performed at [facility], [address], [place], [place] [address]    Report Status PENDING  Incomplete  MRSA PCR Screening     Status: None   Collection Time: 12/08/20 11:03 PM   Specimen: Nasopharyngeal  Result Value Ref Range Status   MRSA by PCR NEGATIVE NEGATIVE Final    Comment:        The GeneXpert MRSA Assay (FDA approved for NASAL specimens only), is one component of a comprehensive MRSA  colonization surveillance program. It is not intended to diagnose MRSA infection nor to guide or monitor treatment for MRSA infections. Performed at [facility], [place] [place], [place] [address]     Procedures and diagnostic studies:  CT ABDOMEN PELVIS W CONTRAST  Result Date: 12/08/2020 CLINICAL DATA:  Abdominal pain with distension constipation, bowel obstruction suspected. EXAM: CT ABDOMEN AND PELVIS WITH CONTRAST TECHNIQUE: Multidetector CT imaging of the abdomen and pelvis was performed using the standard protocol following bolus administration of intravenous contrast. CONTRAST:  132mL OMNIPAQUE IOHEXOL 300 MG/ML  SOLN COMPARISON:  Chest CT March 06, 2019 FINDINGS: Lower chest: New nodular left lower lobe ground-glass opacity. Bibasilar bronchiectasis with areas of mucoid impaction. Bilateral calcified pleural plaques. Normal size heart. Coronary artery calcifications. Mitral annulus calcifications. No significant pericardial effusion or thickening. Hepatobiliary: No suspicious hepatic lesion. Gallbladder is distended with gallbladder wall thickening and pericholecystic fluid. No biliary ductal dilation. Pancreas: Unremarkable. Spleen: Unremarkable Adrenals/Urinary Tract: Bilateral adrenal glands are unremarkable. No hydronephrosis. Bilateral cortical renal scarring. Bilateral tiny subcentimeter hypodense renal lesions which are technically too small to accurately characterize but favored represent cysts. Postsurgical changes of urolift procedure. Mild trabecular thickening of the urinary bladder. Right posterior bladder diverticulum versus pseudo ureterocele on image 82/2. Stomach/Bowel: Stomach is decompressed limiting evaluation. Normal positioning of the duodenum/ligament of [place]. No evidence of bowel obstruction. The appendix is surgically absent per report. Moderate volume of formed stool throughout the colon. Colonic diverticulosis without findings of acute  diverticulitis. Vascular/Lymphatic: Aortic atherosclerosis. No enlarged abdominal or pelvic lymph nodes. Reproductive: Prostate is present with changes of recent urolift procedure. Other: No abdominopelvic ascites. Musculoskeletal: Multilevel degenerative changes spine. Degenerative change of the bilateral hips. No acute osseous abnormality. IMPRESSION: 1. Distended gallbladder with gallbladder wall thickening and pericholecystic fluid. Findings are concerning for acute cholecystitis. 2. New nodular left lower lobe ground-glass opacity may represent pneumonia recommend follow-up dedicated chest CT in 2-3 months to ensure resolution. 3. Postsurgical changes urolift insertion with nodular thickening of the adjacent right side pelvic musculature, which may be sequela  of the procedure. Recommend attention on follow-up imaging. 4. Colonic diverticulosis without findings of acute diverticulitis. 5. Moderate volume of formed stool throughout the colon. 6. Aortic atherosclerosis.  Aortic Atherosclerosis (ICD10-I70.0). These results were called by telephone at the time of interpretation on 12/08/2020 at 11:04 am to provider MARK QUALE , who verbally acknowledged these results. Electronically Signed   By: Dahlia Bailiff MD   On: 12/08/2020 11:06   DG Abdomen Acute W/Chest  Result Date: 12/08/2020 CLINICAL DATA:  Distended abdomen EXAM: DG ABDOMEN ACUTE WITH 1 VIEW CHEST COMPARISON:  Chest x-ray 08/11/2018 FINDINGS: Heart size and vascularity normal. Negative for heart failure. Negative for pneumonia. Large calcified plaque overlying the left chest. Calcified pleural plaque also noted on the right. Mild bibasilar atelectasis. Normal bowel gas pattern. Negative for bowel obstruction or ileus. No free air. No urinary tract calculi. Surgical clips in the region of the prostate. Mild degenerative change lumbar spine. IMPRESSION: Mild bibasilar atelectasis. Bilateral calcified pleural plaques, chronic Normal bowel gas pattern.  Electronically Signed   By: Franchot Gallo M.D.   On: 12/08/2020 10:00   US ABDOMEN LIMITED RUQ (LIVER/GB)  Result Date: 12/08/2020 CLINICAL DATA:  Abdominal pain for the past day. EXAM: ULTRASOUND ABDOMEN LIMITED RIGHT UPPER QUADRANT COMPARISON:  CT abdomen pelvis from same day. FINDINGS: Gallbladder: Gallbladder wall thickening and pericholecystic fluid. No gallstones. No sonographic Murphy sign noted by sonographer. Common bile duct: Diameter: 3 mm, normal. Liver: No focal lesion identified. Within normal limits in parenchymal echogenicity. Portal vein is patent on color Doppler imaging with normal direction of blood flow towards the liver. Other: None. IMPRESSION: 1. Gallbladder wall thickening and pericholecystic fluid without cholelithiasis, concerning for acalculus cholecystitis. Electronically Signed   By: Titus Dubin M.D.   On: 12/08/2020 15:04    Medications:   . allopurinol  300 mg Oral BH-q7a  . atorvastatin  40 mg Oral BH-q7a  . bisacodyl  10 mg Oral BID  . finasteride  5 mg Oral BH-q7a  . Ipratropium-Albuterol  1 puff Inhalation BH-q7a  . lisinopril  40 mg Oral Daily  . melatonin  5 mg Oral QHS  . mometasone-formoterol  2 puff Inhalation BID  . NIFEdipine  60 mg Oral BH-q7a  . pantoprazole  40 mg Oral Daily  . polyethylene glycol  17 g Oral QHS  . senna-docusate  2 tablet Oral Daily   Continuous Infusions: . azithromycin Stopped (12/08/20 1610)  . cefTRIAXone (ROCEPHIN)  IV    . lactated ringers    . metronidazole 500 mg (12/09/20 0404)     LOS: 0 days   Geradine Girt  [facility]   How to contact the [facility] Attending or Consulting provider [place] or covering provider during after hours [place], for this patient?  1. Check the care team in [facility] and look for a) attending/consulting [facility] provider listed and b) the [facility] team listed 2. Log into [email or web address] and use [place]'s universal password to access. If you do not have the password, please contact the  hospital operator. 3. Locate the [facility] provider you are looking for under [facility] and page to a number that you can be directly reached. 4. If you still have difficulty reaching the provider, please page the [facility] (Director on Call) for the Hospitalists listed on amion for assistance.  12/09/2020, 10:54 AM

## 2020-12-10 ENCOUNTER — Telehealth: Payer: Self-pay

## 2020-12-10 DIAGNOSIS — K819 Cholecystitis, unspecified: Secondary | ICD-10-CM | POA: Diagnosis not present

## 2020-12-10 LAB — BASIC METABOLIC PANEL
Anion gap: 5 (ref 5–15)
BUN: 26 mg/dL — ABNORMAL HIGH (ref 8–23)
CO2: 23 mmol/L (ref 22–32)
Calcium: 8.7 mg/dL — ABNORMAL LOW (ref 8.9–10.3)
Chloride: 109 mmol/L (ref 98–111)
Creatinine, Ser: 1.48 mg/dL — ABNORMAL HIGH (ref 0.61–1.24)
GFR, Estimated: 44 mL/min — ABNORMAL LOW (ref >60)
Glucose, Bld: 95 mg/dL (ref 70–99)
Potassium: 3.8 mmol/L (ref 3.5–5.1)
Sodium: 137 mmol/L (ref 135–145)

## 2020-12-10 LAB — CBC
HCT: 30.1 % — ABNORMAL LOW (ref 39.0–52.0)
Hemoglobin: 9.9 g/dL — ABNORMAL LOW (ref 13.0–17.0)
MCH: 32.7 pg (ref 26.0–34.0)
MCHC: 32.9 g/dL (ref 30.0–36.0)
MCV: 99.3 fL (ref 80.0–100.0)
Platelets: 141 10*3/uL — ABNORMAL LOW (ref 150–400)
RBC: 3.03 MIL/uL — ABNORMAL LOW (ref 4.22–5.81)
RDW: 13.5 % (ref 11.5–15.5)
WBC: 11.2 10*3/uL — ABNORMAL HIGH (ref 4.0–10.5)
nRBC: 0 % (ref 0.0–0.2)

## 2020-12-10 LAB — LACTIC ACID, PLASMA: Lactic Acid, Venous: 0.8 mmol/L (ref 0.5–1.9)

## 2020-12-10 MED ORDER — BISACODYL 10 MG RE SUPP
10.0000 mg | Freq: Every day | RECTAL | Status: DC | PRN
  Administered 2020-12-14: 10 mg via RECTAL
  Filled 2020-12-10: qty 1

## 2020-12-10 MED ORDER — ENOXAPARIN SODIUM 40 MG/0.4ML ~~LOC~~ SOLN
40.0000 mg | SUBCUTANEOUS | Status: DC
  Administered 2020-12-10: 40 mg via SUBCUTANEOUS
  Filled 2020-12-10: qty 0.4

## 2020-12-10 MED ORDER — GABAPENTIN 300 MG PO CAPS
300.0000 mg | ORAL_CAPSULE | Freq: Two times a day (BID) | ORAL | Status: DC
  Administered 2020-12-10 – 2020-12-14 (×8): 300 mg via ORAL
  Filled 2020-12-10 (×8): qty 1

## 2020-12-10 MED ORDER — DM-GUAIFENESIN ER 30-600 MG PO TB12
1.0000 | ORAL_TABLET | Freq: Two times a day (BID) | ORAL | Status: DC
  Administered 2020-12-10 – 2020-12-14 (×8): 1 via ORAL
  Filled 2020-12-10 (×8): qty 1

## 2020-12-10 MED ORDER — ASPIRIN EC 81 MG PO TBEC
81.0000 mg | DELAYED_RELEASE_TABLET | Freq: Every day | ORAL | Status: DC
  Administered 2020-12-11 – 2020-12-14 (×4): 81 mg via ORAL
  Filled 2020-12-10 (×4): qty 1

## 2020-12-10 MED ORDER — ALFUZOSIN HCL ER 10 MG PO TB24
10.0000 mg | ORAL_TABLET | Freq: Every day | ORAL | Status: DC
  Administered 2020-12-11 – 2020-12-14 (×4): 10 mg via ORAL
  Filled 2020-12-10 (×5): qty 1

## 2020-12-10 NOTE — Telephone Encounter (Signed)
Advised patient's daughter of results. She will call to reschedule a follow up appointment once he is home from the hospital/hd

## 2020-12-10 NOTE — Telephone Encounter (Signed)
Spoke with patient's daughter and advised her of information per Dr. Nehemiah Massed.

## 2020-12-10 NOTE — Progress Notes (Signed)
Progress Note    Lonnie [name].  KXF:[id number] DOB: 03-25-1927  DOA: 12/08/2020 PCP: Derinda Late, MD    Brief Narrative:     Medical records reviewed and are as summarized below:  Lonnie [name]. is an 87 y.o. male with medical history significant for basal cell carcinoma status post skin excision, BPH, hypertension, CKD 3, COPD, coronary artery disease, hyperlipidemia, presented to the emergency department for chief concerns of abdominal pain.  Found to have cholecystitis and is s/p perc drain.  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   Acute cholecystitis   Cholecystitis- Meets sepsis criteria with sinus tachycardia, elevated WBC, intra-abdominal source, elevated lactic acid -Right upper quadrant ultrasound read as acalculus cholecystitis -General surgery consult appreciated:  IR did perc drain- culture sent -Continue antibiotics -Blood cultures x2- NGTD  Hypertension-controlled -Resumed lisinopril 40 mg daily, nifedipine 60 mg   Hyperlipidemia -atorvastatin 40 mg daily resumed  Chronic constipation -GlycoLax nightly daily -Resumed Senokot and Dulcolax  Basal cell carcinoma s/p skin excision on 12/01/20 Possible cellulitis  -abx above -picture placed in chart -wOC consult  Chronic gout -allopurinol 300 mg daily resumed  CKD stage IIIb -baseline CR 1.3   Lives alone, PT eval-- home health, RN for help with perc drain  Family Communication/Anticipated D/C date and plan/Code Status   DVT prophylaxis: scd Code Status: Full Code.  Disposition Plan: Status is: inpt  The patient will require care spanning > 2 midnights and should be  moved to inpatient because: IV treatments appropriate due to intensity of illness or inability to take PO  Dispo: The patient is from: Home              Anticipated d/c is to: Home              Patient currently is not medically stable to d/c.   Difficult to place patient No         Medical Consultants:    IR  GS    Subjective:   Asking for his dentures, nursing reports nausea after breakfast  Objective:    Vitals:   12/09/20 2348 12/10/20 0328 12/10/20 0748 12/10/20 1151  BP: (!) 152/60 (!) 145/60 (!) 148/57 134/64  Pulse: 71 69 72 73  Resp: 16 16 18 16   Temp: 98.5 F (36.9 C) 98.4 F (36.9 C) 97.8 F (36.6 C) 97.7 F (36.5 C)  TempSrc:  Oral Oral   SpO2: 91% 93% 92% (!) 89%  Weight:      Height:        Intake/Output Summary (Last 24 hours) at 12/10/2020 1308 Last data filed at 12/10/2020 1027 Gross per 24 hour  Intake 666.58 ml  Output 1425 ml  Net -758.42 ml   Filed Weights   12/08/20 0855 12/08/20 2051  Weight: 83 kg 82.1 kg    Exam:  General: Appearance:     Overweight male in no acute distress       Lungs:     respirations unlabored  Heart:    Normal heart rate. Normal rhythm. No murmurs, rubs, or gallops.   MS:   All extremities are intact.   Neurologic:  Awake, alert, pleasant and cooperative     Data Reviewed:   I have personally reviewed following labs and imaging studies:  Labs: Labs show the following:   Basic Metabolic Panel: Recent Labs  Lab 12/08/20 0901 12/10/20 0613  NA 137 137  K 4.2 3.8  CL 106 109  CO2 21* 23  GLUCOSE 101* 95  BUN 21 26*  CREATININE 1.43* 1.48*  CALCIUM 9.0 8.7*   GFR Estimated Creatinine Clearance: 32.7 mL/min (A) (by C-G formula based on SCr of 1.48 mg/dL (H)). Liver Function Tests: Recent Labs  Lab 12/08/20 0901  AST 23  ALT 16  ALKPHOS 89  BILITOT 0.9  PROT 7.1  ALBUMIN 3.7   Recent Labs  Lab 12/08/20 0901  LIPASE 35   No results for input(s): AMMONIA in the last 168  hours. Coagulation profile Recent Labs  Lab 12/08/20 0901 12/09/20 0553  INR 1.1 1.2    CBC: Recent Labs  Lab 12/08/20 0901 12/10/20 0613  WBC 21.3* 11.2*  NEUTROABS 18.9*  --   HGB 12.3* 9.9*  HCT 36.0* 30.1*  MCV 97.8 99.3  PLT 181 141*   Cardiac Enzymes: No results for input(s): CKTOTAL, CKMB, CKMBINDEX, TROPONINI in the last 168 hours. BNP (last 3 results) No results for input(s): PROBNP in the last 8760 hours. CBG: No results for input(s): GLUCAP in the last 168 hours. D-Dimer: No results for input(s): DDIMER in the last 72 hours. Hgb A1c: No results for input(s): HGBA1C in the last 72 hours. Lipid Profile: No results for input(s): CHOL, HDL, LDLCALC, TRIG, CHOLHDL, LDLDIRECT in the last 72 hours. Thyroid function studies: No results for input(s): TSH, T4TOTAL, T3FREE, THYROIDAB in the last 72 hours.  Invalid input(s): FREET3 Anemia work up: No results for input(s): VITAMINB12, FOLATE, FERRITIN, TIBC, IRON, RETICCTPCT in the last 72 hours. Sepsis Labs: Recent Labs  Lab 12/08/20 0901 12/08/20 1312 12/08/20 1510 12/08/20 1610 12/09/20 0553 12/10/20 1308  PROCALCITON  --   --   --   --  0.31  --   WBC 21.3*  --   --   --   --  11.2*  LATICACIDVEN  --  1.2 9.3* 6.6*  --  0.8    Microbiology Recent Results (from the past 240 hour(s))  SARS CORONAVIRUS 2 (TAT 6-24 HRS) Nasopharyngeal Nasopharyngeal Swab     Status: None   Collection Time: 12/08/20  1:00 PM   Specimen: Nasopharyngeal Swab  Result Value Ref Range Status   SARS Coronavirus 2 NEGATIVE NEGATIVE Final    Comment: (NOTE) SARS-CoV-2 target nucleic acids are NOT DETECTED.  The SARS-CoV-2 RNA is generally detectable in upper and lower respiratory specimens during the acute phase of infection. Negative results do not preclude SARS-CoV-2 infection, do not rule out co-infections with other pathogens, and should not be used as the sole basis for treatment or other patient management  decisions. Negative results must be combined with clinical observations, patient history, and epidemiological information. The expected result is Negative.  Fact Sheet for Patients: SugarRoll.be  Fact Sheet for Healthcare Providers: https://www.woods-mathews.com/  This test is not yet approved or cleared by the [place] FDA and  has been authorized for detection and/or diagnosis of SARS-CoV-2 by FDA under an Emergency Use Authorization (EUA). This EUA will remain  in effect (meaning this test can be used) for the duration of the COVID-19 declaration under Se ction 564(b)(1) of the Act, 21 U.S.C. section 360bbb-3(b)(1), unless the authorization is terminated or revoked sooner.  Performed at [facility], [place] [address], [place], [place] [address]   Culture, blood (Routine X 2) w Reflex to ID Panel     Status: None (Preliminary result)   Collection Time: 12/08/20  1:00 PM   Specimen: BLOOD  Result Value Ref Range Status   Specimen Description BLOOD BLOOD LEFT WRIST  Final   Special Requests   Final    BOTTLES DRAWN AEROBIC AND ANAEROBIC Blood Culture adequate volume   Culture   Final    NO GROWTH 2 DAYS Performed at [facility], [address] [place], [place] [address]    Report Status PENDING  Incomplete  Culture, blood (Routine X 2) w Reflex to ID Panel     Status: None (Preliminary result)   Collection Time: 12/08/20  1:12 PM   Specimen: BLOOD  Result Value Ref Range Status   Specimen Description BLOOD LEFT ANTECUBITAL  Final   Special Requests   Final    BOTTLES DRAWN AEROBIC AND ANAEROBIC Blood Culture results may not be optimal due to an excessive volume of blood received in culture bottles   Culture   Final    NO GROWTH 2 DAYS Performed at [facility], [address], [place], [place] [address]    Report Status PENDING  Incomplete  MRSA PCR Screening     Status: None   Collection Time:  12/08/20 11:03 PM   Specimen: Nasopharyngeal  Result Value Ref Range Status   MRSA by PCR NEGATIVE NEGATIVE Final    Comment:        The GeneXpert MRSA Assay (FDA approved for NASAL specimens only), is one component of a comprehensive MRSA colonization surveillance program. It is not intended to diagnose MRSA infection nor to guide or monitor treatment for MRSA infections. Performed at [facility], [address] [place], [place] [address]   Aerobic/Anaerobic Culture (surgical/deep wound)     Status: None (Preliminary result)   Collection Time: 12/09/20 12:20 PM   Specimen: Abscess  Result Value Ref Range Status   Specimen Description   Final    ABSCESS Performed at [facility], [address] [place], [place] [address]    Special Requests POST IMAGE GUIDED CHOLE TUBE PLACEMENT  Final   Gram Stain NO WBC SEEN NO ORGANISMS SEEN   Final   Culture   Final    NO GROWTH < 24 HOURS Performed at [facility], [place] [address], [place], [place] [address]    Report Status PENDING  Incomplete    Procedures and diagnostic studies:  [place] Intraoperative  Result Date: 12/09/2020 CLINICAL DATA:  Ultrasound was provided for use by the ordering physician.  No provider Interpretation or professional fees incurred.    CT IMAGE GUIDED DRAINAGE BY PERCUTANEOUS CATHETER  Result Date: 12/09/2020 INDICATION: Acute cholecystitis. Poor operative candidate. Please perform image guided cholecystostomy tube placement for infection source control purposes. EXAM: CT AND FLUOROSCOPIC-GUIDED CHOLECYSTOSTOMY TUBE PLACEMENT COMPARISON:  CT abdomen and pelvis-12/08/2020; right upper quadrant abdominal ultrasound-12/08/2020 MEDICATIONS: The patient is currently admitted to the hospital and on intravenous antibiotics. Antibiotics were administered within an appropriate time frame prior to skin puncture. ANESTHESIA/SEDATION: Moderate (conscious) sedation was employed during this  procedure. A total of Versed 1 mg and Fentanyl 100 mcg was administered intravenously. Moderate Sedation Time: 23 minutes. The patient's level of consciousness and vital signs were monitored continuously by radiology nursing throughout the procedure under my direct supervision. CONTRAST:  None FLUOROSCOPY TIME:  Not provided COMPLICATIONS: None immediate. PROCEDURE: Informed written consent was obtained from the patient after a discussion of the risks, benefits and alternatives to treatment. Questions regarding the procedure were encouraged and answered. A timeout was performed prior to the initiation of the procedure. Patient was positioned supine on the CT gantry and noncontrast images were obtained of the abdomen demonstrating similar appearance of the mildly distended gallbladder with associated gallbladder wall thickening and minimal amount of pericholecystic stranding. The gallbladder was then identified sonographically. The right upper abdominal quadrant was prepped and draped in the usual sterile fashion, and a sterile drape was applied covering the operative field. Maximum barrier sterile technique with sterile gowns and gloves were used for the procedure. A timeout was performed prior to the initiation of the procedure. Local anesthesia was provided with 1% lidocaine with epinephrine. Utilizing a transhepatic approach, an 18 gauge needle was advanced into the gallbladder under direct ultrasound guidance. An ultrasound image was saved for documentation purposes. Appropriate position was confirmed with CT imaging Next, the track was dilated allowing placement of a 10.2-French Cook cholecystomy tube was advanced into the gallbladder lumen, coiled and locked. A small amount of aspirated bile was capped and sent to the laboratory for analysis. The catheter was secured to the skin with suture, connected to a drainage bag and a dressing was placed. The patient tolerated the procedure well without immediate post  procedural complication. IMPRESSION: Successful ultrasound and CT guided placement of a 10.2 French cholecystostomy tube. Small amount of aspirated bile was sent to the laboratory for analysis. Electronically Signed   By: Sandi Mariscal M.D.   On: 12/09/2020 12:48   US ABDOMEN LIMITED RUQ (LIVER/GB)  Result Date: 12/08/2020 CLINICAL DATA:  Abdominal pain for the past day. EXAM: ULTRASOUND ABDOMEN LIMITED RIGHT UPPER QUADRANT COMPARISON:  CT abdomen pelvis from same day. FINDINGS: Gallbladder: Gallbladder wall thickening and pericholecystic fluid. No gallstones. No sonographic Murphy sign noted by sonographer. Common bile duct: Diameter: 3 mm, normal. Liver: No focal lesion identified. Within normal limits in parenchymal echogenicity. Portal vein is patent on color Doppler imaging with normal direction of blood flow towards the liver. Other: None. IMPRESSION: 1. Gallbladder wall thickening and pericholecystic fluid without cholelithiasis, concerning for acalculus cholecystitis. Electronically Signed   By: Titus Dubin M.D.   On: 12/08/2020 15:04    Medications:   . allopurinol  300 mg Oral BH-q7a  . atorvastatin  40 mg Oral BH-q7a  . bisacodyl  10 mg Oral BID  . finasteride  5 mg Oral BH-q7a  . Ipratropium-Albuterol  1 puff Inhalation BH-q7a  . lisinopril  40 mg Oral Daily  . melatonin  5 mg Oral QHS  . mometasone-formoterol  2 puff Inhalation BID  . NIFEdipine  60 mg Oral BH-q7a  . pantoprazole  40 mg Oral Daily  . polyethylene glycol  17 g Oral QHS  . senna-docusate  2 tablet Oral Daily  . sodium chloride flush  5 mL Intracatheter Q8H   Continuous Infusions: . cefTRIAXone (ROCEPHIN)  IV Stopped (12/09/20 1611)  . metronidazole 500 mg (12/10/20 1143)     LOS: 1 day   Geradine Girt  [facility]   How to contact the [facility] Attending or Consulting provider Ken Caryl or covering provider during after hours [place], for this patient?  1. Check the care team in [facility] and look for a)  attending/consulting [facility] provider listed and b) the [facility] team listed 2. Log into [email or web address] and use [facility]'s universal password to access. If you do not have the password, please contact the hospital operator. 3. Locate the [facility] provider you are looking for under [facility] and page to a number that you can be directly reached. 4. If you still have difficulty reaching the provider, please page the [facility] (Director on Call) for the Hospitalists listed on amion for assistance.  12/10/2020, 1:08 PM

## 2020-12-10 NOTE — Consult Note (Signed)
[place] Nurse Consult Note: Reason for Consult: Known basal cell carcinoma behind left ear.  Wound type:neoplasm Pressure Injury POA: NA Measurement:to be obtained by Bedside RN with placement of first dressing change today Wound bed: red and black, crusted Drainage (amount, consistency, odor) small, no odor Periwound: mild erythema Dressing procedure/placement/frequency: I have secure chatted with Dr. Eliseo Squires and she reports that patient is followed by Dermatology. Here we will dressing twice daily with xeroform gauze and replace as needed. Post discharge, he can resume the treatment recommended by his Dermatology provider.  [place] nursing team will not follow, but will remain available to this patient, the nursing and medical teams.  Please re-consult if needed. Thanks, Maudie Flakes, MSN, RN, [place], Arther Abbott  Pager# [phone]

## 2020-12-10 NOTE — Progress Notes (Signed)
Physical Therapy Treatment Patient Details Name: Lonnie [name]. MRN: [id number] DOB: [birth year] Today's Date: 12/10/2020    History of Present Illness Pt is a 87 y.o. male with acute cholecystitis, complicated by pertinent comorbidities including hypertension, chronic kidney disease stage III, COPD, coronary tree disease, hyperlipidemia. s/p percutaneous cholecystostomy 12/09/20.    PT Comments    Patient in bed, agreeable to PT. Did not quantify pain levels today but did report/exhibit mild pain signs/symptoms with mobility/ambulation today. Pt demonstrated good progression towards goals, but remains limited in functional distances and changes from PLOF. Supine to sit with HOB elevated and CGA. Extended time needed and use of bed rails needed. Sit <> stand with RW and CGA three times, cued for hand placement ea time. He was able to transfer to the chair, and after a rest ambulate ~37ft with RW and CGA with close chair follow. He exhibited decreased gait velocity, shuffled step, but no LOB. Mildly effortful for pt. Pt up in chair with all needs in reach at end of session. The patient would benefit from further skilled PT intervention to continue to progress towards goals. Recommendation remains appropriate.        Follow Up Recommendations  Home health PT;Supervision for mobility/OOB;Supervision - Intermittent     Equipment Recommendations  Rolling walker with 5" wheels    Recommendations for Other Services       Precautions / Restrictions Precautions Precautions: Fall Precaution Comments: drain Restrictions Weight Bearing Restrictions: No    Mobility  Bed Mobility Overal bed mobility: Needs Assistance Bed Mobility: Supine to Sit     Supine to sit: Supervision;HOB elevated     General bed mobility comments: extended time needed, use of grab bars    Transfers Overall transfer level: Needs assistance Equipment used: Rolling walker (2 wheeled) Transfers: Sit to/from  Stand Sit to Stand: Min guard         General transfer comment: cued for hand placement ea time  Ambulation/Gait Ambulation/Gait assistance: Min guard Gait Distance (Feet): 60 Feet Assistive device: Rolling walker (2 wheeled)       General Gait Details: very slow, shuffled gait, flexed trunk, no LOB noted. Pt endorsed this type of ambulation is similiar to baseline   Chief Strategy Officer    Modified Rankin (Stroke Patients Only)       Balance Overall balance assessment: Needs assistance Sitting-balance support: Feet supported Sitting balance-Leahy Scale: Good     Standing balance support: Bilateral upper extremity supported Standing balance-Leahy Scale: Fair                              Cognition Arousal/Alertness: Awake/alert Behavior During Therapy: WFL for tasks assessed/performed Overall Cognitive Status: Within Functional Limits for tasks assessed                                        Exercises      General Comments        Pertinent Vitals/Pain Pain Assessment: Faces Faces Pain Scale: Hurts a little bit Pain Location: with coughing, R sided flank pain Pain Descriptors / Indicators: Grimacing;Guarding;Moaning Pain Intervention(s): Limited activity within patient's tolerance;Monitored during session;Premedicated before session    Home Living  Prior Function            PT Goals (current goals can now be found in the care plan section) Progress towards PT goals: Progressing toward goals    Frequency    Min 2X/week      PT Plan Current plan remains appropriate    Co-evaluation              AM-PAC PT "6 Clicks" Mobility   Outcome Measure  Help needed turning from your back to your side while in a flat bed without using bedrails?: A Little Help needed moving from lying on your back to sitting on the side of a flat bed without using bedrails?: A  Little Help needed moving to and from a bed to a chair (including a wheelchair)?: A Little Help needed standing up from a chair using your arms (e.g., wheelchair or bedside chair)?: A Little Help needed to walk in hospital room?: A Little Help needed climbing 3-5 steps with a railing? : A Lot 6 Click Score: 17    End of Session Equipment Utilized During Treatment: Gait belt Activity Tolerance: Patient tolerated treatment well Patient left: with call bell/phone within reach;in chair;with chair alarm set Nurse Communication: Mobility status PT Visit Diagnosis: Other abnormalities of gait and mobility (R26.89);Muscle weakness (generalized) (M62.81);Pain Pain - Right/Left: Right Pain - part of body:  (flank)     Time: 2897-9150 PT Time Calculation (min) (ACUTE ONLY): 33 min  Charges:  $Therapeutic Exercise: 23-37 mins                     Lieutenant Diego PT, DPT 1:04 PM,12/10/20

## 2020-12-10 NOTE — TOC Initial Note (Signed)
Transition of Care (TOC) - Initial/Assessment Note    Patient Details  Name: Lonnie [name]. MRN: [id number] Date of Birth: 10/25/1926  Transition of Care [facility]) CM/SW Contact:    Candie Chroman, LCSW Phone Number: 12/10/2020, 2:58 PM  Clinical Narrative:  CSW met with patient. Daughter Vickie at bedside. CSW introduced role and explained that PT recommendations would be discussed. They are agreeable to home health. Patient received home health services in September or October of last year and they would like to start services with them again. Daughter could not remember the name of the agency. CSW reached out to [facility] hospital liaison. Patient is an active patient with them. PCP is Dr. Vista Lawman at the [facility]. Per [facility] liaison, Steva Ready, patient had used his [facility] Medicare to get home health last time and was set up with [facility]. CSW called their liaison and she will review referral. Per Ms. Tyrell Antonio, "if they don't or if he has co-pays, we would be happy to provide auth in that situation." CSW notified patient of DME recommendation for rolling walker. Patient declined. He reported he has a rollator, cane, and motorized 4-wheel scooter at home for when he is outdoors. Patient has an aide that comes to the home 11 hours per week which was set up through the [place]. No further concerns. CSW encouraged patient and his daughter to contact CSW as needed. CSW will continue to follow patient for support and facilitate return home when stable, likely Monday.           Expected Discharge Plan: [place] Barriers to Discharge: Continued Medical Work up   Patient Goals and CMS Choice        Expected Discharge Plan and Services Expected Discharge Plan: [place] Choice: [place] arrangements for the past 2 months: Single Family Home                                      Prior Living Arrangements/Services Living  arrangements for the past 2 months: Single Family Home Lives with:: Self Patient language and need for interpreter reviewed:: Yes Do you feel safe going back to the place where you live?: Yes      Need for Family Participation in Patient Care: Yes (Comment) Care giver support system in place?: Yes (comment) Current home services: DME Criminal Activity/Legal Involvement Pertinent to Current Situation/Hospitalization: No - Comment as needed  Activities of Daily Living Home Assistive Devices/Equipment: Eyeglasses,Hearing aid,Dentures (specify type),Cane (specify quad or straight),Grab bars in shower ADL Screening (condition at time of admission) Patient's cognitive ability adequate to safely complete daily activities?: Yes Is the patient deaf or have difficulty hearing?: Yes Does the patient have difficulty seeing, even when wearing glasses/contacts?: No Does the patient have difficulty concentrating, remembering, or making decisions?: No Patient able to express need for assistance with ADLs?: Yes Does the patient have difficulty dressing or bathing?: No Independently performs ADLs?: Yes (appropriate for developmental age) Does the patient have difficulty walking or climbing stairs?: Yes Weakness of Legs: Both Weakness of Arms/Hands: None  Permission Sought/Granted Permission sought to share information with : Facility Retail banker granted to share information with : Yes, Verbal Permission Granted  Share Information with NAME: [name]  Permission granted to share info w AGENCY: [place], [place]  Permission granted to share info w Relationship: Daughter  Permission granted to share info w Contact Information: [phone]  Emotional Assessment Appearance:: Appears stated age Attitude/Demeanor/Rapport: Engaged,Gracious Affect (typically observed): Accepting,Appropriate,Calm,Pleasant Orientation: : Oriented to Self,Oriented to  Place,Oriented to  Time,Oriented to Situation Alcohol / Substance Use: Not Applicable Psych Involvement: No (comment)  Admission diagnosis:  Acute cholecystitis [K81.0] Cholecystitis [K81.9] Abdominal pain [R10.9] Patient Active Problem List   Diagnosis Date Noted  . Acute cholecystitis 12/09/2020  . Cholecystitis 12/08/2020  . Basal cell carcinoma (BCC) of left ear 12/08/2020  . Hypotonicity of bladder 11/29/2018  . Acute lower UTI 08/11/2018  . UTI (urinary tract infection) 08/11/2018  . Incomplete bladder emptying 08/09/2017  . 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 Stage 3 chronic kidney disease ([place]) 08/09/2017  . Pneumonia 01/10/2016  . Nocturia 01/28/2015  . Bundle branch block, left 06/11/2014  . Dizziness 06/11/2014  . Dyspnea 06/11/2014  . History of vertigo 06/11/2014  . Hx of asbestosis 06/11/2014  . Hyperlipidemia 06/11/2014  . Hypertension 06/11/2014  . Chronic obstructive pulmonary disease, unspecified ([place]) 03/06/2014  . Elevated prostate specific antigen (PSA) 05/13/2013   PCP:  Derinda Late, MD Pharmacy:   [place], [place], [place] [address] [place] [address] Phone: [phone] Fax: [place], [place] - [address] [place] [address] Phone: [phone] Fax: [place], [place] [address] Phone: [phone] Fax: [phone]     Social Determinants of Health (SDOH) Interventions    Readmission Risk Interventions No flowsheet data found.

## 2020-12-10 NOTE — Telephone Encounter (Signed)
-----   Message from Ralene Bathe, MD sent at 12/03/2020  2:03 PM EST ----- Diagnosis Skin (M), left ear posterior aspect RESIDUAL BASAL CELL CARCINOMA, MARGINS FREE  Cancer - BCC Margins free

## 2020-12-10 NOTE — Progress Notes (Signed)
Patient ID: Lonnie Sermons., male   DOB: 1927-02-04, 87 y.o.   MRN: [id number]     [place] Hospital Day(s): 1.   Post op day(s):  Marland Kitchen   Interval History: Patient seen and examined, no acute events or new complaints overnight. Patient reports feeling nausea today. No significant pain. Pain does not radiates to other part of the body. No alleviating or aggravating factors.   Patient had Perc cholecystostomy yesterday. WBC trending down.   Vital signs in last 24 hours: [min-max] current  Temp:  [97.7 F (36.5 C)-98.5 F (36.9 C)] 97.7 F (36.5 C) (03/10 1713) Pulse Rate:  [69-77] 72 (03/10 1713) Resp:  [16-18] 16 (03/10 1713) BP: (134-152)/(57-70) 140/64 (03/10 1713) SpO2:  [87 %-94 %] 94 % (03/10 1713)     Height: 5\' 7"  (170.2 cm) Weight: 82.1 kg BMI (Calculated): 28.34   Physical Exam:  Constitutional: alert, cooperative and no distress  Respiratory: breathing non-labored at rest  Cardiovascular: regular rate and sinus rhythm  Gastrointestinal: soft, non-tender, and non-distended  Labs:  CBC Latest Ref Rng & Units 12/10/2020 12/08/2020 09/28/2020  WBC 4.0 - 10.5 K/uL 11.2(H) 21.3(H) 8.6  Hemoglobin 13.0 - 17.0 g/dL 9.9(L) 12.3(L) 10.2(L)  Hematocrit 39.0 - 52.0 % 30.1(L) 36.0(L) 31.8(L)  Platelets 150 - 400 K/uL 141(L) 181 169   CMP Latest Ref Rng & Units 12/10/2020 12/08/2020 09/28/2020  Glucose 70 - 99 mg/dL 95 101(H) 151(H)  BUN 8 - 23 mg/dL 26(H) 21 24(H)  Creatinine 0.61 - 1.24 mg/dL 1.48(H) 1.43(H) 1.30(H)  Sodium 135 - 145 mmol/L 137 137 139  Potassium 3.5 - 5.1 mmol/L 3.8 4.2 3.7  Chloride 98 - 111 mmol/L 109 106 104  CO2 22 - 32 mmol/L 23 21(L) 25  Calcium 8.9 - 10.3 mg/dL 8.7(L) 9.0 9.2  Total Protein 6.5 - 8.1 g/dL - 7.1 -  Total Bilirubin 0.3 - 1.2 mg/dL - 0.9 -  Alkaline Phos 38 - 126 U/L - 89 -  AST 15 - 41 U/L - 23 -  ALT 0 - 44 U/L - 16 -    Imaging studies: No new pertinent imaging studies   Assessment/Plan:  87 y.o. male with acute  cholecystitis s/p percutaneous cholecystostomy, complicated by pertinent comorbidities including hypertension, chronic kidney disease stage III, COPD, coronary tree disease, hyperlipidemia..  Patient with improved WBC count but with more nausea today. Will continue to observe for diet toleration. Agree to continue IV abx therapy. Progress diet slowly. Encourage to get out of bed as possible for patient usual activity. I will continue to follow.   Arnold Long, MD

## 2020-12-11 ENCOUNTER — Inpatient Hospital Stay: Payer: No Typology Code available for payment source

## 2020-12-11 DIAGNOSIS — K819 Cholecystitis, unspecified: Secondary | ICD-10-CM | POA: Diagnosis not present

## 2020-12-11 LAB — CBC
HCT: 32.1 % — ABNORMAL LOW (ref 39.0–52.0)
Hemoglobin: 10.7 g/dL — ABNORMAL LOW (ref 13.0–17.0)
MCH: 32.7 pg (ref 26.0–34.0)
MCHC: 33.3 g/dL (ref 30.0–36.0)
MCV: 98.2 fL (ref 80.0–100.0)
Platelets: 166 10*3/uL (ref 150–400)
RBC: 3.27 MIL/uL — ABNORMAL LOW (ref 4.22–5.81)
RDW: 13.3 % (ref 11.5–15.5)
WBC: 11.1 10*3/uL — ABNORMAL HIGH (ref 4.0–10.5)
nRBC: 0 % (ref 0.0–0.2)

## 2020-12-11 LAB — BASIC METABOLIC PANEL
Anion gap: 8 (ref 5–15)
BUN: 29 mg/dL — ABNORMAL HIGH (ref 8–23)
CO2: 21 mmol/L — ABNORMAL LOW (ref 22–32)
Calcium: 8.9 mg/dL (ref 8.9–10.3)
Chloride: 109 mmol/L (ref 98–111)
Creatinine, Ser: 1.65 mg/dL — ABNORMAL HIGH (ref 0.61–1.24)
GFR, Estimated: 39 mL/min — ABNORMAL LOW (ref >60)
Glucose, Bld: 93 mg/dL (ref 70–99)
Potassium: 3.8 mmol/L (ref 3.5–5.1)
Sodium: 138 mmol/L (ref 135–145)

## 2020-12-11 MED ORDER — FUROSEMIDE 10 MG/ML IJ SOLN
40.0000 mg | Freq: Once | INTRAMUSCULAR | Status: AC
  Administered 2020-12-11: 40 mg via INTRAVENOUS
  Filled 2020-12-11: qty 4

## 2020-12-11 MED ORDER — SODIUM CHLORIDE 0.9 % IV SOLN
3.0000 g | Freq: Two times a day (BID) | INTRAVENOUS | Status: DC
  Administered 2020-12-12 – 2020-12-13 (×4): 3 g via INTRAVENOUS
  Filled 2020-12-11: qty 8
  Filled 2020-12-11: qty 3
  Filled 2020-12-11: qty 8
  Filled 2020-12-11: qty 3
  Filled 2020-12-11 (×2): qty 8

## 2020-12-11 MED ORDER — LINEZOLID 600 MG PO TABS
600.0000 mg | ORAL_TABLET | Freq: Two times a day (BID) | ORAL | Status: DC
  Administered 2020-12-11 – 2020-12-13 (×5): 600 mg via ORAL
  Filled 2020-12-11 (×6): qty 1

## 2020-12-11 MED ORDER — ENOXAPARIN SODIUM 30 MG/0.3ML ~~LOC~~ SOLN
30.0000 mg | SUBCUTANEOUS | Status: DC
  Administered 2020-12-11 – 2020-12-13 (×3): 30 mg via SUBCUTANEOUS
  Filled 2020-12-11 (×3): qty 0.3

## 2020-12-11 MED ORDER — SODIUM CHLORIDE 0.9 % IV SOLN
INTRAVENOUS | Status: DC | PRN
  Administered 2020-12-11 – 2020-12-13 (×2): 250 mL via INTRAVENOUS

## 2020-12-11 NOTE — Progress Notes (Signed)
Patient ID: Lonnie [name]., male   DOB: 1926/12/05, 87 y.o.   MRN: [id number]     [place] Hospital Day(s): 2.   Post op day(s):  Marland Kitchen   Interval History: Patient seen and examined, no acute events or new complaints overnight. Patient reports feeling more comfortable today.  He denies nausea.  He denies significant pain.  He reports he tolerated his breakfast.  Vital signs in last 24 hours: [min-max] current  Temp:  [97.4 F (36.3 C)-98.7 F (37.1 C)] 98.7 F (37.1 C) (03/11 1157) Pulse Rate:  [70-77] 77 (03/11 1157) Resp:  [16-18] 16 (03/11 1157) BP: (124-153)/(52-66) 131/58 (03/11 1157) SpO2:  [91 %-94 %] 93 % (03/11 1157)     Height: 5\' 7"  (170.2 cm) Weight: 82.1 kg BMI (Calculated): 28.34   Physical Exam:  Constitutional: alert, cooperative and no distress  Respiratory: breathing non-labored at rest  Cardiovascular: regular rate and sinus rhythm  Gastrointestinal: soft, non-tender, and non-distended.  Drain in place  Labs:  CBC Latest Ref Rng & Units 12/11/2020 12/10/2020 12/08/2020  WBC 4.0 - 10.5 K/uL 11.1(H) 11.2(H) 21.3(H)  Hemoglobin 13.0 - 17.0 g/dL 10.7(L) 9.9(L) 12.3(L)  Hematocrit 39.0 - 52.0 % 32.1(L) 30.1(L) 36.0(L)  Platelets 150 - 400 K/uL 166 141(L) 181   CMP Latest Ref Rng & Units 12/11/2020 12/10/2020 12/08/2020  Glucose 70 - 99 mg/dL 93 95 101(H)  BUN 8 - 23 mg/dL 29(H) 26(H) 21  Creatinine 0.61 - 1.24 mg/dL 1.65(H) 1.48(H) 1.43(H)  Sodium 135 - 145 mmol/L 138 137 137  Potassium 3.5 - 5.1 mmol/L 3.8 3.8 4.2  Chloride 98 - 111 mmol/L 109 109 106  CO2 22 - 32 mmol/L 21(L) 23 21(L)  Calcium 8.9 - 10.3 mg/dL 8.9 8.7(L) 9.0  Total Protein 6.5 - 8.1 g/dL - - 7.1  Total Bilirubin 0.3 - 1.2 mg/dL - - 0.9  Alkaline Phos 38 - 126 U/L - - 89  AST 15 - 41 U/L - - 23  ALT 0 - 44 U/L - - 16    Imaging studies: No new pertinent imaging studies   Assessment/Plan:  87 y.o. male with acute cholecystitis s/p percutaneous cholecystostomy, complicated by  pertinent comorbidities including hypertension, chronic kidney disease stage III, COPD, coronary tree disease, hyperlipidemia.  Patient recovering adequately.  Today he is feeling better with no nausea.  He tolerated breakfast.  From the surgical standpoint patient can be transition to oral antibiotics for 10 days.  Patient will need follow-up with IR clinic as outpatient for evaluation of cystic duct patency.  I will coordinate that as outpatient.  I would not expect any surgical management during this admission.  Continue medical management as per primary team.  Arnold Long, MD

## 2020-12-11 NOTE — Progress Notes (Signed)
PHARMACIST - PHYSICIAN COMMUNICATION  CONCERNING:  Enoxaparin (Lovenox) for DVT Prophylaxis    RECOMMENDATION: Patient was prescribed enoxaparin 40mg  q24 hours for VTE prophylaxis.   Filed Weights   12/08/20 0855 12/08/20 2051  Weight: 83 kg (183 lb) 82.1 kg (181 lb)    Body mass index is 28.35 kg/m.  Estimated Creatinine Clearance: 29.3 mL/min (A) (by C-G formula based on SCr of 1.65 mg/dL (H)).   Normalized CrCl = 25 mL/min   Patient is candidate for enoxaparin 30mg  every 24 hours based on CrCl <71ml/min or Weight <45kg  DESCRIPTION: Pharmacy has adjusted enoxaparin dose per [facility] policy.  Patient is now receiving enoxaparin 30 mg every 24 hours    Benita Gutter 12/11/2020 9:23 AM

## 2020-12-11 NOTE — Progress Notes (Signed)
Progress Note    Lonnie [name].  AQT:[id number] DOB: 1927-01-06  DOA: 12/08/2020 PCP: Derinda Late, MD    Brief Narrative:     Medical records reviewed and are as summarized below:  Lonnie [name]. is an 87 y.o. male with medical history significant for basal cell carcinoma status post skin excision, BPH, hypertension, CKD 3, COPD, coronary artery disease, hyperlipidemia, presented to the emergency department for chief concerns of abdominal pain.  Found to have cholecystitis and is s/p perc drain.  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   Acute cholecystitis   Cholecystitis- Meets sepsis criteria with sinus tachycardia, elevated WBC, intra-abdominal source, elevated lactic acid -Right upper quadrant ultrasound read as acalculus cholecystitis -General surgery consult appreciated:  IR did perc drain- culture: RARE ENTEROCOCCUS FAECIUM -Continue antibiotics- de-escalate based on sensitivities  -Blood cultures x2- NGTD  Acute respiratory failure due to Pulmonary edema -1 x dose of IV lasix -wean to room air  Hypertension-controlled -Resumed lisinopril 40 mg daily, nifedipine 60 mg   Hyperlipidemia -atorvastatin 40 mg daily resumed  Chronic constipation -GlycoLax nightly daily -Resumed Senokot and Dulcolax  Basal cell carcinoma s/p skin excision on 12/01/20 -abx above -picture placed in chart [facility] consult appreciated   Chronic gout -allopurinol 300 mg daily resumed  CKD stage IIIb -baseline CR 1.3   Lives alone, PT eval-- home health, RN for help with perc drain  Family Communication/Anticipated D/C date and plan/Code Status   DVT  prophylaxis: scd Code Status: Full Code.  Disposition Plan: Status is: inpt Daughter at bedside  The patient will require care spanning > 2 midnights and should be moved to inpatient because: IV treatments appropriate due to intensity of illness or inability to take PO  Dispo: The patient is from: Home              Anticipated d/c is to: Home              Patient currently is not medically stable to d/c.   Difficult to place patient No         Medical Consultants:    IR  GS    Subjective:   No nausea after eating today  Objective:    Vitals:   12/11/20 0017 12/11/20 0408 12/11/20 0742 12/11/20 1157  BP: 132/63 (!) 125/52 (!) 124/59 (!) 131/58  Pulse: 70 73 77 77  Resp: 16 18 18 16   Temp: 98.1 F (36.7 C) (!) 97.4 F (36.3 C) 98.1 F (36.7 C) 98.7 F (37.1 C)  TempSrc: Oral Oral Oral   SpO2: 92% 91% 93% 93%  Weight:      Height:        Intake/Output Summary (Last 24 hours) at 12/11/2020 1539 Last data filed at 12/11/2020 1448 Gross per 24 hour  Intake 1232.95 ml  Output 640 ml  Net 592.95 ml   Filed Weights   12/08/20 0855 12/08/20 2051  Weight: 83 kg 82.1 kg    Exam:   General: Appearance:     Overweight male in no acute distress     Lungs:     On [place], respirations unlabored  Heart:  Normal heart rate. Normal rhythm. No murmurs, rubs, or gallops.   MS:   All extremities are intact.   Neurologic:   Awake, alert, oriented x 3.                        Data Reviewed:   I have personally reviewed following labs and imaging studies:  Labs: Labs show the following:   Basic Metabolic Panel: Recent Labs  Lab 12/08/20 0901 12/10/20 0613 12/11/20 0421  NA 137 137 138  K 4.2 3.8 3.8  CL 106 109 109  CO2 21* 23 21*  GLUCOSE 101* 95 93  BUN 21 26* 29*  CREATININE 1.43* 1.48* 1.65*  CALCIUM 9.0 8.7* 8.9   GFR Estimated Creatinine Clearance: 29.3 mL/min (A) (by C-G formula based on SCr of 1.65 mg/dL (H)). Liver Function  Tests: Recent Labs  Lab 12/08/20 0901  AST 23  ALT 16  ALKPHOS 89  BILITOT 0.9  PROT 7.1  ALBUMIN 3.7   Recent Labs  Lab 12/08/20 0901  LIPASE 35   No results for input(s): AMMONIA in the last 168 hours. Coagulation profile Recent Labs  Lab 12/08/20 0901 12/09/20 0553  INR 1.1 1.2    CBC: Recent Labs  Lab 12/08/20 0901 12/10/20 0613 12/11/20 0421  WBC 21.3* 11.2* 11.1*  NEUTROABS 18.9*  --   --   HGB 12.3* 9.9* 10.7*  HCT 36.0* 30.1* 32.1*  MCV 97.8 99.3 98.2  PLT 181 141* 166   Cardiac Enzymes: No results for input(s): CKTOTAL, CKMB, CKMBINDEX, TROPONINI in the last 168 hours. BNP (last 3 results) No results for input(s): PROBNP in the last 8760 hours. CBG: No results for input(s): GLUCAP in the last 168 hours. D-Dimer: No results for input(s): DDIMER in the last 72 hours. Hgb A1c: No results for input(s): HGBA1C in the last 72 hours. Lipid Profile: No results for input(s): CHOL, HDL, LDLCALC, TRIG, CHOLHDL, LDLDIRECT in the last 72 hours. Thyroid function studies: No results for input(s): TSH, T4TOTAL, T3FREE, THYROIDAB in the last 72 hours.  Invalid input(s): FREET3 Anemia work up: No results for input(s): VITAMINB12, FOLATE, FERRITIN, TIBC, IRON, RETICCTPCT in the last 72 hours. Sepsis Labs: Recent Labs  Lab 12/08/20 0901 12/08/20 1312 12/08/20 1510 12/08/20 1610 12/09/20 0553 12/10/20 0613 12/11/20 0421  PROCALCITON  --   --   --   --  0.31  --   --   WBC 21.3*  --   --   --   --  11.2* 11.1*  LATICACIDVEN  --  1.2 9.3* 6.6*  --  0.8  --     Microbiology Recent Results (from the past 240 hour(s))  SARS CORONAVIRUS 2 (TAT 6-24 HRS) Nasopharyngeal Nasopharyngeal Swab     Status: None   Collection Time: 12/08/20  1:00 PM   Specimen: Nasopharyngeal Swab  Result Value Ref Range Status   SARS Coronavirus 2 NEGATIVE NEGATIVE Final    Comment: (NOTE) SARS-CoV-2 target nucleic acids are NOT DETECTED.  The SARS-CoV-2 RNA is generally  detectable in upper and lower respiratory specimens during the acute phase of infection. Negative results do not preclude SARS-CoV-2 infection, do not rule out co-infections with other pathogens, and should not be used as the sole basis for treatment or other patient management decisions. Negative results must be combined with clinical observations, patient history, and epidemiological information. The expected result is Negative.  Fact Sheet for Patients: SugarRoll.be  Fact Sheet for Healthcare Providers: https://www.woods-mathews.com/  This test is not yet approved or cleared by the [place] and  has been authorized for detection and/or diagnosis of SARS-CoV-2 by FDA under an Emergency Use Authorization (EUA). This EUA will remain  in effect (meaning this test can be used) for the duration of the COVID-19 declaration under Se ction 564(b)(1) of the Act, 21 U.S.C. section 360bbb-3(b)(1), unless the authorization is terminated or revoked sooner.  Performed at [facility], [place] [address], [place], [place] [address]   Culture, blood (Routine X 2) w Reflex to ID Panel     Status: None (Preliminary result)   Collection Time: 12/08/20  1:00 PM   Specimen: BLOOD  Result Value Ref Range Status   Specimen Description BLOOD BLOOD LEFT WRIST  Final   Special Requests   Final    BOTTLES DRAWN AEROBIC AND ANAEROBIC Blood Culture adequate volume   Culture   Final    NO GROWTH 3 DAYS Performed at [facility], [address] [place], [place] [address]    Report Status PENDING  Incomplete  Culture, blood (Routine X 2) w Reflex to ID Panel     Status: None (Preliminary result)   Collection Time: 12/08/20  1:12 PM   Specimen: BLOOD  Result Value Ref Range Status   Specimen Description BLOOD LEFT ANTECUBITAL  Final   Special Requests   Final    BOTTLES DRAWN AEROBIC AND ANAEROBIC Blood Culture results may not be optimal due  to an excessive volume of blood received in culture bottles   Culture   Final    NO GROWTH 3 DAYS Performed at [facility], [address], [place], [place] [address]    Report Status PENDING  Incomplete  MRSA PCR Screening     Status: None   Collection Time: 12/08/20 11:03 PM   Specimen: Nasopharyngeal  Result Value Ref Range Status   MRSA by PCR NEGATIVE NEGATIVE Final    Comment:        The GeneXpert MRSA Assay (FDA approved for NASAL specimens only), is one component of a comprehensive MRSA colonization surveillance program. It is not intended to diagnose MRSA infection nor to guide or monitor treatment for MRSA infections. Performed at [facility], [address] [place], [place] [address]   Aerobic/Anaerobic Culture (surgical/deep wound)     Status: None (Preliminary result)   Collection Time: 12/09/20 12:20 PM   Specimen: Abscess  Result Value Ref Range Status   Specimen Description   Final    ABSCESS Performed at [facility], [address], [place], [place] [address]    Special Requests POST IMAGE GUIDED CHOLE TUBE PLACEMENT  Final   Gram Stain   Final    NO WBC SEEN NO ORGANISMS SEEN Performed at [facility], [place] [address] [place], [place] [address]    Culture   Final    RARE ENTEROCOCCUS FAECIUM NO ANAEROBES ISOLATED; CULTURE IN PROGRESS FOR 5 DAYS    Report Status PENDING  Incomplete    Procedures and diagnostic studies:  DG Chest Port 1 View  Result Date: 12/11/2020 CLINICAL DATA:  Hypoxia EXAM: PORTABLE CHEST 1 VIEW COMPARISON:  08/11/2018 FINDINGS: The heart size and mediastinal contours are within normal limits. Probable small bilateral pleural effusions. Mild, diffuse interstitial pulmonary opacity. Calcified pleural plaques project bilaterally. The visualized skeletal structures are unremarkable. IMPRESSION: 1. Mild, diffuse interstitial pulmonary opacity, most consistent with edema. Probable small bilateral  pleural effusions. No focal airspace  opacity. 2. Calcified pleural plaques project bilaterally, consistent with prior asbestos exposure. Electronically Signed   By: Eddie Candle M.D.   On: 12/11/2020 13:55    Medications:   . alfuzosin  10 mg Oral Q breakfast  . allopurinol  300 mg Oral BH-q7a  . aspirin EC  81 mg Oral Daily  . atorvastatin  40 mg Oral BH-q7a  . bisacodyl  10 mg Oral BID  . dextromethorphan-guaiFENesin  1 tablet Oral BID  . enoxaparin (LOVENOX) injection  30 mg Subcutaneous Q24H  . finasteride  5 mg Oral BH-q7a  . furosemide  40 mg Intravenous Once  . gabapentin  300 mg Oral BID  . Ipratropium-Albuterol  1 puff Inhalation BH-q7a  . lisinopril  40 mg Oral Daily  . melatonin  5 mg Oral QHS  . mometasone-formoterol  2 puff Inhalation BID  . NIFEdipine  60 mg Oral BH-q7a  . pantoprazole  40 mg Oral Daily  . polyethylene glycol  17 g Oral QHS  . senna-docusate  2 tablet Oral Daily  . sodium chloride flush  5 mL Intracatheter Q8H   Continuous Infusions: . sodium chloride 10 mL/hr at 12/11/20 1448  . cefTRIAXone (ROCEPHIN)  IV Stopped (12/11/20 1224)  . metronidazole Stopped (12/11/20 1406)     LOS: 2 days   Geradine Girt  [facility]   How to contact the [facility] Attending or Consulting provider [place] or covering provider during after hours [place], for this patient?  1. Check the care team in [facility] and look for a) attending/consulting TRH provider listed and b) the [facility] team listed 2. Log into [email or web address] and use [place]'s universal password to access. If you do not have the password, please contact the hospital operator. 3. Locate the [facility] provider you are looking for under [facility] and page to a number that you can be directly reached. 4. If you still have difficulty reaching the provider, please page the [facility] (Director on Call) for the Hospitalists listed on amion for assistance.  12/11/2020, 3:39 PM

## 2020-12-12 DIAGNOSIS — K819 Cholecystitis, unspecified: Secondary | ICD-10-CM | POA: Diagnosis not present

## 2020-12-12 LAB — CBC
HCT: 31.2 % — ABNORMAL LOW (ref 39.0–52.0)
Hemoglobin: 10.2 g/dL — ABNORMAL LOW (ref 13.0–17.0)
MCH: 32.4 pg (ref 26.0–34.0)
MCHC: 32.7 g/dL (ref 30.0–36.0)
MCV: 99 fL (ref 80.0–100.0)
Platelets: 171 10*3/uL (ref 150–400)
RBC: 3.15 MIL/uL — ABNORMAL LOW (ref 4.22–5.81)
RDW: 13.3 % (ref 11.5–15.5)
WBC: 8.4 10*3/uL (ref 4.0–10.5)
nRBC: 0 % (ref 0.0–0.2)

## 2020-12-12 LAB — BASIC METABOLIC PANEL
Anion gap: 7 (ref 5–15)
BUN: 38 mg/dL — ABNORMAL HIGH (ref 8–23)
CO2: 20 mmol/L — ABNORMAL LOW (ref 22–32)
Calcium: 8.7 mg/dL — ABNORMAL LOW (ref 8.9–10.3)
Chloride: 109 mmol/L (ref 98–111)
Creatinine, Ser: 1.99 mg/dL — ABNORMAL HIGH (ref 0.61–1.24)
GFR, Estimated: 31 mL/min — ABNORMAL LOW (ref >60)
Glucose, Bld: 113 mg/dL — ABNORMAL HIGH (ref 70–99)
Potassium: 3.7 mmol/L (ref 3.5–5.1)
Sodium: 136 mmol/L (ref 135–145)

## 2020-12-12 NOTE — Progress Notes (Signed)
Attempted to walk patient. He was able to stand but had to sit back down. On his second time standing, this nurse was able to get him up to the chair. His oxygen level was good on room air but his respirations went to 28 with exertion and he complained of SOB, he was placed  back on O2@1L /M for comfort. Patient says he can't walk right now that he is way to weak. MD notified via secure chat.

## 2020-12-12 NOTE — Progress Notes (Signed)
Progress Note    Lonnie [name].  URK:[id number] DOB: [birth year]  DOA: 12/08/2020 PCP: Derinda Late, MD    Brief Narrative:     Medical records reviewed and are as summarized below:  Lonnie [name]. is an 87 y.o. male with medical history significant for basal cell carcinoma status post skin excision, BPH, hypertension, CKD 3, COPD, coronary artery disease, hyperlipidemia, presented to the emergency department for chief concerns of abdominal pain.  Found to have cholecystitis and is s/p perc drain.  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   Acute cholecystitis   Cholecystitis- Meets sepsis criteria with sinus tachycardia, elevated WBC, intra-abdominal source, elevated lactic acid -Right upper quadrant ultrasound read as acalculus cholecystitis -General surgery consult appreciated:  IR did perc drain- culture: RARE ENTEROCOCCUS FAECIUM -Continue antibiotics- de-escalate based on sensitivities  -Blood cultures x2- NGTD  Acute respiratory failure due to Pulmonary edema -1 x dose of IV lasix -wean to room air -IS -aggressive ambulation  Hypertension-controlled - nifedipine 60 mg  -stop lisinopril  Hyperlipidemia -atorvastatin 40 mg daily resumed  Chronic constipation -GlycoLax nightly daily -Resumed Senokot and Dulcolax  Basal cell carcinoma s/p skin excision on 12/01/20 -abx above -picture placed in chart [facility] consult appreciated   Chronic gout -allopurinol 300 mg daily resumed  CKD stage IIIb -baseline CR 1.3 -daily labs   Lives alone, PT eval-- home health, RN for help with perc drain -have asked for OOB and ambulate 2-3x/day-- high  risk to weaken and need SNF  Family Communication/Anticipated D/C date and plan/Code Status   DVT prophylaxis: scd Code Status: Full Code.  Disposition Plan: Status is: inpt Daughter at bedside  The patient will require care spanning > 2 midnights and should be moved to inpatient because: IV treatments appropriate due to intensity of illness or inability to take PO  Dispo: The patient is from: Home              Anticipated d/c is to: Home              Patient currently is not medically stable to d/c.   Difficult to place patient No         Medical Consultants:    IR  GS    Subjective:  No complaints  Objective:    Vitals:   12/11/20 1954 12/11/20 2347 12/12/20 0428 12/12/20 0745  BP: 134/64 (!) 116/55 (!) 129/58 131/66  Pulse: 73 82 75 76  Resp: 16 18 20 20   Temp: 98.6 F (37 C) 98 F (36.7 C) 97.7 F (36.5 C) (!) 97.4 F (36.3 C)  TempSrc: Oral Oral Oral Oral  SpO2: 94% 90% 94% 94%  Weight:      Height:        Intake/Output Summary (Last 24 hours) at 12/12/2020 1114 Last data filed at 12/12/2020 0656 Gross per 24 hour  Intake 1477.95 ml  Output 1150 ml  Net 327.95 ml   Filed Weights   12/08/20 0855 12/08/20 2051  Weight: 83 kg 82.1 kg    Exam:   General: Appearance:     Overweight male in no acute distress  Lungs:     On [place], respirations unlabored  Heart:    Normal heart rate. Normal rhythm. No murmurs, rubs, or gallops.   MS:   All extremities are intact.   Neurologic:   Awake, alert, oriented x 3.    Data Reviewed:   I have personally reviewed following labs and imaging studies:  Labs: Labs show the following:   Basic Metabolic Panel: Recent Labs  Lab 12/08/20 0901 12/10/20 0613 12/11/20 0421 12/12/20 0504  NA 137 137 138 136  K 4.2 3.8 3.8 3.7  CL 106 109 109 109  CO2 21* 23 21* 20*  GLUCOSE 101* 95 93 113*  BUN 21 26* 29* 38*  CREATININE 1.43* 1.48* 1.65* 1.99*  CALCIUM 9.0 8.7* 8.9 8.7*   GFR Estimated  Creatinine Clearance: 24.3 mL/min (A) (by C-G formula based on SCr of 1.99 mg/dL (H)). Liver Function Tests: Recent Labs  Lab 12/08/20 0901  AST 23  ALT 16  ALKPHOS 89  BILITOT 0.9  PROT 7.1  ALBUMIN 3.7   Recent Labs  Lab 12/08/20 0901  LIPASE 35   No results for input(s): AMMONIA in the last 168 hours. Coagulation profile Recent Labs  Lab 12/08/20 0901 12/09/20 0553  INR 1.1 1.2    CBC: Recent Labs  Lab 12/08/20 0901 12/10/20 0613 12/11/20 0421 12/12/20 0504  WBC 21.3* 11.2* 11.1* 8.4  NEUTROABS 18.9*  --   --   --   HGB 12.3* 9.9* 10.7* 10.2*  HCT 36.0* 30.1* 32.1* 31.2*  MCV 97.8 99.3 98.2 99.0  PLT 181 141* 166 171   Cardiac Enzymes: No results for input(s): CKTOTAL, CKMB, CKMBINDEX, TROPONINI in the last 168 hours. BNP (last 3 results) No results for input(s): PROBNP in the last 8760 hours. CBG: No results for input(s): GLUCAP in the last 168 hours. D-Dimer: No results for input(s): DDIMER in the last 72 hours. Hgb A1c: No results for input(s): HGBA1C in the last 72 hours. Lipid Profile: No results for input(s): CHOL, HDL, LDLCALC, TRIG, CHOLHDL, LDLDIRECT in the last 72 hours. Thyroid function studies: No results for input(s): TSH, T4TOTAL, T3FREE, THYROIDAB in the last 72 hours.  Invalid input(s): FREET3 Anemia work up: No results for input(s): VITAMINB12, FOLATE, FERRITIN, TIBC, IRON, RETICCTPCT in the last 72 hours. Sepsis Labs: Recent Labs  Lab 12/08/20 0901 12/08/20 1312 12/08/20 1510 12/08/20 1610 12/09/20 0553 12/10/20 0613 12/11/20 0421 12/12/20 0504  PROCALCITON  --   --   --   --  0.31  --   --   --   WBC 21.3*  --   --   --   --  11.2* 11.1* 8.4  LATICACIDVEN  --  1.2 9.3* 6.6*  --  0.8  --   --     Microbiology Recent Results (from the past 240 hour(s))  SARS CORONAVIRUS 2 (TAT 6-24 HRS) Nasopharyngeal Nasopharyngeal Swab     Status: None   Collection Time: 12/08/20  1:00 PM   Specimen: Nasopharyngeal Swab  Result Value  Ref Range Status   SARS Coronavirus 2 NEGATIVE NEGATIVE Final    Comment: (NOTE) SARS-CoV-2 target nucleic acids are NOT DETECTED.  The SARS-CoV-2 RNA is generally detectable in upper and lower respiratory specimens during the acute phase of infection. Negative results do not preclude SARS-CoV-2 infection, do not rule out co-infections with other pathogens, and should not be used as the sole basis for treatment or other patient management decisions. Negative results must be combined with clinical observations, patient  history, and epidemiological information. The expected result is Negative.  Fact Sheet for Patients: SugarRoll.be  Fact Sheet for Healthcare Providers: https://www.woods-mathews.com/  This test is not yet approved or cleared by the [place] FDA and  has been authorized for detection and/or diagnosis of SARS-CoV-2 by FDA under an Emergency Use Authorization (EUA). This EUA will remain  in effect (meaning this test can be used) for the duration of the COVID-19 declaration under Se ction 564(b)(1) of the Act, 21 U.S.C. section 360bbb-3(b)(1), unless the authorization is terminated or revoked sooner.  Performed at [facility], [place] [address] [place], [place] [address]   Culture, blood (Routine X 2) w Reflex to ID Panel     Status: None (Preliminary result)   Collection Time: 12/08/20  1:00 PM   Specimen: BLOOD  Result Value Ref Range Status   Specimen Description BLOOD BLOOD LEFT WRIST  Final   Special Requests   Final    BOTTLES DRAWN AEROBIC AND ANAEROBIC Blood Culture adequate volume   Culture   Final    NO GROWTH 4 DAYS Performed at [facility], [address] [place], [place] [address]    Report Status PENDING  Incomplete  Culture, blood (Routine X 2) w Reflex to ID Panel     Status: None (Preliminary result)   Collection Time: 12/08/20  1:12 PM   Specimen: BLOOD  Result Value Ref Range  Status   Specimen Description BLOOD LEFT ANTECUBITAL  Final   Special Requests   Final    BOTTLES DRAWN AEROBIC AND ANAEROBIC Blood Culture results may not be optimal due to an excessive volume of blood received in culture bottles   Culture   Final    NO GROWTH 4 DAYS Performed at [facility], [address], [place], [place] [address]    Report Status PENDING  Incomplete  MRSA PCR Screening     Status: None   Collection Time: 12/08/20 11:03 PM   Specimen: Nasopharyngeal  Result Value Ref Range Status   MRSA by PCR NEGATIVE NEGATIVE Final    Comment:        The GeneXpert MRSA Assay (FDA approved for NASAL specimens only), is one component of a comprehensive MRSA colonization surveillance program. It is not intended to diagnose MRSA infection nor to guide or monitor treatment for MRSA infections. Performed at [facility], [address] [place], [place] [address]   Aerobic/Anaerobic Culture (surgical/deep wound)     Status: None (Preliminary result)   Collection Time: 12/09/20 12:20 PM   Specimen: Abscess  Result Value Ref Range Status   Specimen Description   Final    ABSCESS Performed at [facility], [address] [place], [place] [address]    Special Requests POST IMAGE GUIDED CHOLE TUBE PLACEMENT  Final   Gram Stain   Final    NO WBC SEEN NO ORGANISMS SEEN Performed at [facility], [place] [address], [place], [place] [address]    Culture   Final    RARE ENTEROCOCCUS FAECIUM NO ANAEROBES ISOLATED; CULTURE IN PROGRESS FOR 5 DAYS    Report Status PENDING  Incomplete    Procedures and diagnostic studies:  DG Chest Port 1 View  Result Date: 12/11/2020 CLINICAL DATA:  Hypoxia EXAM: PORTABLE CHEST 1 VIEW COMPARISON:  08/11/2018 FINDINGS: The heart size and mediastinal contours are within normal limits. Probable small bilateral pleural effusions. Mild, diffuse interstitial pulmonary opacity. Calcified pleural plaques project  bilaterally. The visualized  skeletal structures are unremarkable. IMPRESSION: 1. Mild, diffuse interstitial pulmonary opacity, most consistent with edema. Probable small bilateral pleural effusions. No focal airspace opacity. 2. Calcified pleural plaques project bilaterally, consistent with prior asbestos exposure. Electronically Signed   By: Eddie Candle M.D.   On: 12/11/2020 13:55    Medications:   . alfuzosin  10 mg Oral Q breakfast  . allopurinol  300 mg Oral BH-q7a  . aspirin EC  81 mg Oral Daily  . atorvastatin  40 mg Oral BH-q7a  . bisacodyl  10 mg Oral BID  . dextromethorphan-guaiFENesin  1 tablet Oral BID  . enoxaparin (LOVENOX) injection  30 mg Subcutaneous Q24H  . finasteride  5 mg Oral BH-q7a  . gabapentin  300 mg Oral BID  . Ipratropium-Albuterol  1 puff Inhalation BH-q7a  . linezolid  600 mg Oral Q12H  . lisinopril  40 mg Oral Daily  . melatonin  5 mg Oral QHS  . mometasone-formoterol  2 puff Inhalation BID  . NIFEdipine  60 mg Oral BH-q7a  . pantoprazole  40 mg Oral Daily  . polyethylene glycol  17 g Oral QHS  . senna-docusate  2 tablet Oral Daily  . sodium chloride flush  5 mL Intracatheter Q8H   Continuous Infusions: . sodium chloride 10 mL/hr at 12/11/20 1448  . ampicillin-sulbactam (UNASYN) IV 3 g (12/12/20 0841)     LOS: 3 days   Geradine Girt  [facility]   How to contact the [facility] Attending or Consulting provider [place] or covering provider during after hours [place], for this patient?  1. Check the care team in [facility] and look for a) attending/consulting [facility] provider listed and b) the [facility] team listed 2. Log into [email or web address] and use [place] universal password to access. If you do not have the password, please contact the hospital operator. 3. Locate the [facility] provider you are looking for under [facility] and page to a number that you can be directly reached. 4. If you still have difficulty reaching the provider, please page the [facility]  (Director on Call) for the Hospitalists listed on amion for assistance.  12/12/2020, 11:14 AM

## 2020-12-12 NOTE — Progress Notes (Signed)
Patient ID: Lonnie Sermons., male   DOB: October 17, 1926, 87 y.o..o.   MRN: [id number]     [place] Hospital Day(s): 3.   Post op day(s):  Marland Kitchen   Interval History: Patient seen and examined, no acute events or new complaints overnight. Patient reports feeling well.  He denies nausea or vomiting.  He denies fever or chills.  Patient denies significant pain.  There is no pain radiation.  There is no alleviating or aggravating factor.  Vital signs in last 24 hours: [min-max] current  Temp:  [97.4 F (36.3 C)-98.7 F (37.1 C)] 97.4 F (36.3 C) (03/12 0745) Pulse Rate:  [73-82] 76 (03/12 0745) Resp:  [16-20] 20 (03/12 0745) BP: (116-134)/(55-66) 131/66 (03/12 0745) SpO2:  [90 %-94 %] 94 % (03/12 0745)     Height: 5\' 7"  (170.2 cm) Weight: 82.1 kg BMI (Calculated): 28.34   Physical Exam:  Constitutional: alert, cooperative and no distress  Respiratory: breathing non-labored at rest  Cardiovascular: regular rate and sinus rhythm  Gastrointestinal: soft, non-tender, and non-distended.  Percutaneous cholecystostomy in place with abundant amount of bile  Labs:  CBC Latest Ref Rng & Units 12/12/2020 12/11/2020 12/10/2020  WBC 4.0 - 10.5 K/uL 8.4 11.1(H) 11.2(H)  Hemoglobin 13.0 - 17.0 g/dL 10.2(L) 10.7(L) 9.9(L)  Hematocrit 39.0 - 52.0 % 31.2(L) 32.1(L) 30.1(L)  Platelets 150 - 400 K/uL 171 166 141(L)   CMP Latest Ref Rng & Units 12/12/2020 12/11/2020 12/10/2020  Glucose 70 - 99 mg/dL 113(H) 93 95  BUN 8 - 23 mg/dL 38(H) 29(H) 26(H)  Creatinine 0.61 - 1.24 mg/dL 1.99(H) 1.65(H) 1.48(H)  Sodium 135 - 145 mmol/L 136 138 137  Potassium 3.5 - 5.1 mmol/L 3.7 3.8 3.8  Chloride 98 - 111 mmol/L 109 109 109  CO2 22 - 32 mmol/L 20(L) 21(L) 23  Calcium 8.9 - 10.3 mg/dL 8.7(L) 8.9 8.7(L)  Total Protein 6.5 - 8.1 g/dL - - -  Total Bilirubin 0.3 - 1.2 mg/dL - - -  Alkaline Phos 38 - 126 U/L - - -  AST 15 - 41 U/L - - -  ALT 0 - 44 U/L - - -    Imaging studies: No new pertinent imaging  studies   Assessment/Plan:  87 y.o.malewith acute cholecystitiss/ppercutaneous cholecystostomy, complicated by pertinent comorbidities includinghypertension, chronic kidney disease stage III, COPD, coronary tree disease, hyperlipidemia.  Patient has responded well to percutaneous cholecystostomy.  Vitals will decrease to 8.4.  I will the amount of output from the drain.  It is a sign of persistent cystic duct closure.  I recommend to continue drain to gravity.  From the cholecystitis standpoint this has resolved and no surgical management will be needed during this admission.  When patient medically stable I will coordinate outpatient follow-up with interventional radiology for evaluation of the patency of the cystic duct.  Patient will continue medical management as per primary team.  Arnold Long, MD

## 2020-12-13 DIAGNOSIS — K819 Cholecystitis, unspecified: Secondary | ICD-10-CM | POA: Diagnosis not present

## 2020-12-13 LAB — CULTURE, BLOOD (ROUTINE X 2)
Culture: NO GROWTH
Culture: NO GROWTH
Special Requests: ADEQUATE

## 2020-12-13 LAB — BASIC METABOLIC PANEL
Anion gap: 6 (ref 5–15)
BUN: 45 mg/dL — ABNORMAL HIGH (ref 8–23)
CO2: 20 mmol/L — ABNORMAL LOW (ref 22–32)
Calcium: 8.8 mg/dL — ABNORMAL LOW (ref 8.9–10.3)
Chloride: 112 mmol/L — ABNORMAL HIGH (ref 98–111)
Creatinine, Ser: 1.75 mg/dL — ABNORMAL HIGH (ref 0.61–1.24)
GFR, Estimated: 36 mL/min — ABNORMAL LOW (ref >60)
Glucose, Bld: 109 mg/dL — ABNORMAL HIGH (ref 70–99)
Potassium: 3.8 mmol/L (ref 3.5–5.1)
Sodium: 138 mmol/L (ref 135–145)

## 2020-12-13 NOTE — Progress Notes (Addendum)
Called into room x 3 to speak with daughter.  No answer.  Also since I have seen patient this AM PT has worked with and now the recommendation is for SNF.  Will update family when able to reach [facility]

## 2020-12-13 NOTE — Progress Notes (Signed)
Progress Note    Lonnie [name].  UKG:[id number] DOB: 07-17-1927  DOA: 12/08/2020 PCP: Derinda Late, MD    Brief Narrative:     Medical records reviewed and are as summarized below:  Lonnie [name]. is an 87 y.o. male with medical history significant for basal cell carcinoma status post skin excision, BPH, hypertension, CKD 3, COPD, coronary artery disease, hyperlipidemia, presented to the emergency department for chief concerns of abdominal pain.  Found to have cholecystitis and is s/p perc drain.  Culture pending.  Needs aggressive ambulation to be able to return home.  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   Acute cholecystitis   Cholecystitis- Meets sepsis criteria with sinus tachycardia, elevated WBC, intra-abdominal source, elevated lactic acid -Right upper quadrant ultrasound read as acalculus cholecystitis -General surgery consult appreciated:  IR did perc drain- culture: RARE ENTEROCOCCUS FAECIUM Per GS on 3/11: sign of persistent cystic duct closure.  I recommend to continue drain to gravity.  From the cholecystitis standpoint this has resolved and no surgical management will be needed during this admission.  When patient medically stable I will coordinate outpatient follow-up with interventional radiology for evaluation of the patency of the cystic duct. -Continue antibiotics- de-escalate based on sensitivities  -Blood cultures x2- NGTD  Acute respiratory failure due to Pulmonary edema -1 x dose of IV lasix -wean to room air -IS -aggressive ambulation  Hypertension-controlled - nifedipine 60 mg  -stop  lisinopril  Hyperlipidemia -atorvastatin 40 mg daily resumed  Chronic constipation -GlycoLax nightly daily -Resumed Senokot and Dulcolax  Basal cell carcinoma s/p skin excision on 12/01/20 -abx above -picture placed in chart [facility] consult appreciated   Chronic gout -allopurinol 300 mg daily resumed  AKI on CKD stage IIIb -baseline CR 1.3 -daily labs   Lives alone, PT eval-- home health, RN for help with perc drain -have asked for OOB and ambulate 2-3x/day-- high risk to weaken and need SNF  Family Communication/Anticipated D/C date and plan/Code Status   DVT prophylaxis: scd Code Status: Full Code.  Disposition Plan: Status is: inpt Daughter at bedside 3/12  The patient will require care spanning > 2 midnights and should be moved to inpatient because: IV treatments appropriate due to intensity of illness or inability to take PO  Dispo: The patient is from: Home              Anticipated d/c is to: Home              Patient currently is not medically stable to d/c.   Difficult to place patient No         Medical Consultants:    IR  GS    Subjective:  appetite poor but forcing himself to eat  Objective:    Vitals:   12/12/20 1653 12/12/20 2145 12/13/20 0516 12/13/20 0829  BP: 130/65 135/60 (!) 132/57 136/62  Pulse: 75 77 75 71  Resp: 16 20 18 18   Temp: 98 F (36.7 C) 97.6 F (36.4 C) (!) 97.5 F (36.4 C) 98.1 F (36.7 C)  TempSrc: Oral Oral Oral Oral  SpO2: 97% 97% 95% 96%  Weight:  Height:        Intake/Output Summary (Last 24 hours) at 12/13/2020 1017 Last data filed at 12/13/2020 4098 Gross per 24 hour  Intake 538 ml  Output 1350 ml  Net -812 ml   Filed Weights   12/08/20 0855 12/08/20 2051  Weight: 83 kg 82.1 kg    Exam:   General: Appearance:     Overweight male in no acute distress   Drain in place  Lungs:     respirations unlabored, no wheezing  Heart:    Normal heart rate. Normal rhythm. No murmurs, rubs, or  gallops.   MS:   All extremities are intact.   Neurologic:   Awake, alert, oriented x 3. No apparent focal neurological           defect.      Data Reviewed:   I have personally reviewed following labs and imaging studies:  Labs: Labs show the following:   Basic Metabolic Panel: Recent Labs  Lab 12/08/20 0901 12/10/20 0613 12/11/20 0421 12/12/20 0504 12/13/20 0422  NA 137 137 138 136 138  K 4.2 3.8 3.8 3.7 3.8  CL 106 109 109 109 112*  CO2 21* 23 21* 20* 20*  GLUCOSE 101* 95 93 113* 109*  BUN 21 26* 29* 38* 45*  CREATININE 1.43* 1.48* 1.65* 1.99* 1.75*  CALCIUM 9.0 8.7* 8.9 8.7* 8.8*   GFR Estimated Creatinine Clearance: 27.6 mL/min (A) (by C-G formula based on SCr of 1.75 mg/dL (H)). Liver Function Tests: Recent Labs  Lab 12/08/20 0901  AST 23  ALT 16  ALKPHOS 89  BILITOT 0.9  PROT 7.1  ALBUMIN 3.7   Recent Labs  Lab 12/08/20 0901  LIPASE 35   No results for input(s): AMMONIA in the last 168 hours. Coagulation profile Recent Labs  Lab 12/08/20 0901 12/09/20 0553  INR 1.1 1.2    CBC: Recent Labs  Lab 12/08/20 0901 12/10/20 0613 12/11/20 0421 12/12/20 0504  WBC 21.3* 11.2* 11.1* 8.4  NEUTROABS 18.9*  --   --   --   HGB 12.3* 9.9* 10.7* 10.2*  HCT 36.0* 30.1* 32.1* 31.2*  MCV 97.8 99.3 98.2 99.0  PLT 181 141* 166 171   Cardiac Enzymes: No results for input(s): CKTOTAL, CKMB, CKMBINDEX, TROPONINI in the last 168 hours. BNP (last 3 results) No results for input(s): PROBNP in the last 8760 hours. CBG: No results for input(s): GLUCAP in the last 168 hours. D-Dimer: No results for input(s): DDIMER in the last 72 hours. Hgb A1c: No results for input(s): HGBA1C in the last 72 hours. Lipid Profile: No results for input(s): CHOL, HDL, LDLCALC, TRIG, CHOLHDL, LDLDIRECT in the last 72 hours. Thyroid function studies: No results for input(s): TSH, T4TOTAL, T3FREE, THYROIDAB in the last 72 hours.  Invalid input(s): FREET3 Anemia work up: No  results for input(s): VITAMINB12, FOLATE, FERRITIN, TIBC, IRON, RETICCTPCT in the last 72 hours. Sepsis Labs: Recent Labs  Lab 12/08/20 0901 12/08/20 1312 12/08/20 1510 12/08/20 1610 12/09/20 0553 12/10/20 0613 12/11/20 0421 12/12/20 0504  PROCALCITON  --   --   --   --  0.31  --   --   --   WBC 21.3*  --   --   --   --  11.2* 11.1* 8.4  LATICACIDVEN  --  1.2 9.3* 6.6*  --  0.8  --   --     Microbiology Recent Results (from the past 240 hour(s))  SARS CORONAVIRUS 2 (TAT 6-24 HRS) Nasopharyngeal Nasopharyngeal  Swab     Status: None   Collection Time: 12/08/20  1:00 PM   Specimen: Nasopharyngeal Swab  Result Value Ref Range Status   SARS Coronavirus 2 NEGATIVE NEGATIVE Final    Comment: (NOTE) SARS-CoV-2 target nucleic acids are NOT DETECTED.  The SARS-CoV-2 RNA is generally detectable in upper and lower respiratory specimens during the acute phase of infection. Negative results do not preclude SARS-CoV-2 infection, do not rule out co-infections with other pathogens, and should not be used as the sole basis for treatment or other patient management decisions. Negative results must be combined with clinical observations, patient history, and epidemiological information. The expected result is Negative.  Fact Sheet for Patients: SugarRoll.be  Fact Sheet for Healthcare Providers: https://www.woods-mathews.com/  This test is not yet approved or cleared by the [place] FDA and  has been authorized for detection and/or diagnosis of SARS-CoV-2 by FDA under an Emergency Use Authorization (EUA). This EUA will remain  in effect (meaning this test can be used) for the duration of the COVID-19 declaration under Se ction 564(b)(1) of the Act, 21 U.S.C. section 360bbb-3(b)(1), unless the authorization is terminated or revoked sooner.  Performed at [facility], [place] [address], [place], [place] [address]   Culture, blood (Routine  X 2) w Reflex to ID Panel     Status: None   Collection Time: 12/08/20  1:00 PM   Specimen: BLOOD  Result Value Ref Range Status   Specimen Description BLOOD BLOOD LEFT WRIST  Final   Special Requests   Final    BOTTLES DRAWN AEROBIC AND ANAEROBIC Blood Culture adequate volume   Culture   Final    NO GROWTH 5 DAYS Performed at [facility], [address] [place], [place] [address]    Report Status 12/13/2020 FINAL  Final  Culture, blood (Routine X 2) w Reflex to ID Panel     Status: None   Collection Time: 12/08/20  1:12 PM   Specimen: BLOOD  Result Value Ref Range Status   Specimen Description BLOOD LEFT ANTECUBITAL  Final   Special Requests   Final    BOTTLES DRAWN AEROBIC AND ANAEROBIC Blood Culture results may not be optimal due to an excessive volume of blood received in culture bottles   Culture   Final    NO GROWTH 5 DAYS Performed at [facility], [address] [place], [place] [address]    Report Status 12/13/2020 FINAL  Final  MRSA PCR Screening     Status: None   Collection Time: 12/08/20 11:03 PM   Specimen: Nasopharyngeal  Result Value Ref Range Status   MRSA by PCR NEGATIVE NEGATIVE Final    Comment:        The GeneXpert MRSA Assay (FDA approved for NASAL specimens only), is one component of a comprehensive MRSA colonization surveillance program. It is not intended to diagnose MRSA infection nor to guide or monitor treatment for MRSA infections. Performed at [facility], [address] [place], [place] [address]   Aerobic/Anaerobic Culture (surgical/deep wound)     Status: None (Preliminary result)   Collection Time: 12/09/20 12:20 PM   Specimen: Abscess  Result Value Ref Range Status   Specimen Description   Final    ABSCESS Performed at [facility], [address] [place], [place] [address]    Special Requests POST IMAGE GUIDED CHOLE TUBE PLACEMENT  Final   Gram Stain NO WBC SEEN NO ORGANISMS SEEN    Final  Culture   Final    RARE ENTEROCOCCUS FAECIUM NO ANAEROBES ISOLATED; CULTURE IN PROGRESS FOR 5 DAYS SUSCEPTIBILITIES TO FOLLOW Performed at [facility], [place] [address] [place], [place] [address]    Report Status PENDING  Incomplete    Procedures and diagnostic studies:  DG Chest Port 1 View  Result Date: 12/11/2020 CLINICAL DATA:  Hypoxia EXAM: PORTABLE CHEST 1 VIEW COMPARISON:  08/11/2018 FINDINGS: The heart size and mediastinal contours are within normal limits. Probable small bilateral pleural effusions. Mild, diffuse interstitial pulmonary opacity. Calcified pleural plaques project bilaterally. The visualized skeletal structures are unremarkable. IMPRESSION: 1. Mild, diffuse interstitial pulmonary opacity, most consistent with edema. Probable small bilateral pleural effusions. No focal airspace opacity. 2. Calcified pleural plaques project bilaterally, consistent with prior asbestos exposure. Electronically Signed   By: Eddie Candle M.D.   On: 12/11/2020 13:55    Medications:   . alfuzosin  10 mg Oral Q breakfast  . allopurinol  300 mg Oral BH-q7a  . aspirin EC  81 mg Oral Daily  . atorvastatin  40 mg Oral BH-q7a  . bisacodyl  10 mg Oral BID  . dextromethorphan-guaiFENesin  1 tablet Oral BID  . enoxaparin (LOVENOX) injection  30 mg Subcutaneous Q24H  . finasteride  5 mg Oral BH-q7a  . gabapentin  300 mg Oral BID  . Ipratropium-Albuterol  1 puff Inhalation BH-q7a  . linezolid  600 mg Oral Q12H  . melatonin  5 mg Oral QHS  . mometasone-formoterol  2 puff Inhalation BID  . NIFEdipine  60 mg Oral BH-q7a  . pantoprazole  40 mg Oral Daily  . polyethylene glycol  17 g Oral QHS  . senna-docusate  2 tablet Oral Daily  . sodium chloride flush  5 mL Intracatheter Q8H   Continuous Infusions: . sodium chloride Stopped (12/12/20 1118)  . ampicillin-sulbactam (UNASYN) IV 3 g (12/13/20 0941)     LOS: 4 days   Geradine Girt  [facility]   How to contact the  [facility] Attending or Consulting provider [place] or covering provider during after hours [place], for this patient?  1. Check the care team in [facility] and look for a) attending/consulting TRH provider listed and b) the [facility] team listed 2. Log into [email or web address] and use [place]'s universal password to access. If you do not have the password, please contact the hospital operator. 3. Locate the [facility] provider you are looking for under [facility] and page to a number that you can be directly reached. 4. If you still have difficulty reaching the provider, please page the [facility] (Director on Call) for the Hospitalists listed on amion for assistance.  12/13/2020, 10:17 AM

## 2020-12-13 NOTE — Progress Notes (Signed)
Physical Therapy Treatment Patient Details Name: Lonnie [name]. MRN: [id number] DOB: 1927-09-01 Today's Date: 12/13/2020    History of Present Illness Pt is a 87 y.o. male with acute cholecystitis, complicated by pertinent comorbidities including hypertension, chronic kidney disease stage III, COPD, coronary tree disease, hyperlipidemia. s/p percutaneous cholecystostomy 12/09/20.    PT Comments    The pt presents this session with reports of increased fatigue. In previous session the pt was able to ambulate x 60', however only mildly tolerates lateral steps at the EOB this session. He is requiring increased assistance for functional mobility and has significant change in [facility] scores, now 13/24 indicating 64.91% impairment as opposed to previous score of 17/24 (50.57% impairment). At this time discharge recommendations have been updated. PT will continue to follow.    Follow Up Recommendations  SNF     Equipment Recommendations  Rolling walker with 5" wheels    Recommendations for Other Services       Precautions / Restrictions Precautions Precautions: Fall Precaution Comments: drain Restrictions Weight Bearing Restrictions: No    Mobility  Bed Mobility Overal bed mobility: Needs Assistance Bed Mobility: Supine to Sit     Supine to sit: Mod assist (pt requiring increased time to achieve EOB.) Sit to supine: Mod assist        Transfers Overall transfer level: Needs assistance Equipment used: Rolling walker (2 wheeled) Transfers: Sit to/from Stand Sit to Stand: Min assist         General transfer comment: Pt demonstrating follow through with hand placement, however requiring assistance and verbal cueing for forward translation during transfer.  Ambulation/Gait Ambulation/Gait assistance: Min assist Gait Distance (Feet): 5 Feet Assistive device: Rolling walker (2 wheeled)       General Gait Details: very slow, shuffled gait, flexed trunk, no LOB noted.  Pt requiring assistance with manuevering RW.   Stairs             Wheelchair Mobility    Modified Rankin (Stroke Patients Only)       Balance Overall balance assessment: Needs assistance Sitting-balance support: Feet supported Sitting balance-Leahy Scale: Poor Sitting balance - Comments: Pt intermittently demonstrating fair static sitting balance, however mostly poor balance due to posterior LOB. Postural control: Posterior lean Standing balance support: During functional activity                                Cognition Arousal/Alertness: Awake/alert Behavior During Therapy: WFL for tasks assessed/performed Overall Cognitive Status: No family/caregiver present to determine baseline cognitive functioning                                 General Comments: Pt alert and oriented      Exercises Other Exercises Other Exercises: sit<>stand transfer training: pt completing x5 transfers with verbal cues for forward translation, progresses to supervision for transfer. Other Exercises: stand<>sit eccentric control: pt given cues for improving eccentric control for safety during stand<>sit transfer.    General Comments        Pertinent Vitals/Pain Pain Assessment: No/denies pain    Home Living                      Prior Function            PT Goals (current goals can now be found in the care plan section) Acute  Rehab PT Goals Patient Stated Goal: to decrease pain PT Goal Formulation: With patient Time For Goal Achievement: 12/23/20 Potential to Achieve Goals: Fair Progress towards PT goals: Not progressing toward goals - comment (Pt demonstrating significant declilne in functional independence this session. Pt unable to tolerate sitting EOB without Min A for balance and to maintain midline in static sitting.)    Frequency    Min 2X/week      PT Plan Discharge plan needs to be updated    Co-evaluation               AM-PAC PT "6 Clicks" Mobility   Outcome Measure  Help needed turning from your back to your side while in a flat bed without using bedrails?: A Lot Help needed moving from lying on your back to sitting on the side of a flat bed without using bedrails?: A Lot Help needed moving to and from a bed to a chair (including a wheelchair)?: A Lot Help needed standing up from a chair using your arms (e.g., wheelchair or bedside chair)?: A Little Help needed to walk in hospital room?: A Lot Help needed climbing 3-5 steps with a railing? : A Lot 6 Click Score: 13    End of Session   Activity Tolerance: Patient limited by fatigue Patient left: in bed;with bed alarm set;with call bell/phone within reach Nurse Communication: Mobility status PT Visit Diagnosis: Other abnormalities of gait and mobility (R26.89);Muscle weakness (generalized) (M62.81);Pain     Time: 1035-1101 PT Time Calculation (min) (ACUTE ONLY): 26 min  Charges:  $Therapeutic Activity: 23-37 mins                     2:58 PM, 12/13/20 Maleigha Colvard A. Saverio Danker PT, DPT Physical Therapist - [facility]    Alexandar Weisenberger A Teresha Hanks 12/13/2020, 2:52 PM

## 2020-12-13 NOTE — Progress Notes (Signed)
Patient was able to get up to chair around 5 pm with stand by assist and front wheel walker. Patient did not experience sob during the transition to chair. Oxygen levels when checked after sitting in chair remained between 98% and 100 % on room air. Daughter [name] was at bedside during this transition. Patient remained in chair until 19:00. Terrial Rhodes

## 2020-12-14 DIAGNOSIS — K819 Cholecystitis, unspecified: Secondary | ICD-10-CM | POA: Diagnosis not present

## 2020-12-14 LAB — BASIC METABOLIC PANEL
Anion gap: 6 (ref 5–15)
BUN: 40 mg/dL — ABNORMAL HIGH (ref 8–23)
CO2: 20 mmol/L — ABNORMAL LOW (ref 22–32)
Calcium: 8.6 mg/dL — ABNORMAL LOW (ref 8.9–10.3)
Chloride: 114 mmol/L — ABNORMAL HIGH (ref 98–111)
Creatinine, Ser: 1.64 mg/dL — ABNORMAL HIGH (ref 0.61–1.24)
GFR, Estimated: 39 mL/min — ABNORMAL LOW (ref >60)
Glucose, Bld: 100 mg/dL — ABNORMAL HIGH (ref 70–99)
Potassium: 4.2 mmol/L (ref 3.5–5.1)
Sodium: 140 mmol/L (ref 135–145)

## 2020-12-14 LAB — AEROBIC/ANAEROBIC CULTURE W GRAM STAIN (SURGICAL/DEEP WOUND): Gram Stain: NONE SEEN

## 2020-12-14 MED ORDER — AMOXICILLIN-POT CLAVULANATE 500-125 MG PO TABS
1.0000 | ORAL_TABLET | Freq: Two times a day (BID) | ORAL | Status: DC
  Administered 2020-12-14: 500 mg via ORAL
  Filled 2020-12-14 (×2): qty 1

## 2020-12-14 MED ORDER — NYSTATIN 100000 UNIT/GM EX POWD
Freq: Three times a day (TID) | CUTANEOUS | Status: DC
  Filled 2020-12-14: qty 15

## 2020-12-14 MED ORDER — NYSTATIN 100000 UNIT/GM EX POWD: Freq: Three times a day (TID) | CUTANEOUS | 0 refills | Status: AC

## 2020-12-14 MED ORDER — AMOXICILLIN-POT CLAVULANATE 500-125 MG PO TABS: 1.0000 | ORAL_TABLET | Freq: Two times a day (BID) | ORAL | 0 refills | Status: DC

## 2020-12-14 NOTE — TOC Progression Note (Signed)
Transition of Care (TOC) - Progression Note    Patient Details  Name: Lonnie [name]. MRN: [id number] Date of Birth: [birth year]  Transition of Care [facility]) CM/SW Contact  Eileen Stanford, LCSW Phone Number: 12/14/2020, 3:36 PM  Clinical Narrative:   CSW confirmed with Hoyle Sauer that [facility] will service pt with [facility].     Expected Discharge Plan: [place] Barriers to Discharge: Continued Medical Work up  Expected Discharge Plan and Services Expected Discharge Plan: [place] Choice: [place] arrangements for the past 2 months: Single Family Home Expected Discharge Date: 12/14/20                                     Social Determinants of Health (SDOH) Interventions    Readmission Risk Interventions No flowsheet data found.

## 2020-12-14 NOTE — Progress Notes (Signed)
Physical Therapy Treatment Patient Details Name: Lonnie [name]. MRN: [id number] DOB: Jul 01, 1927 Today's Date: 12/14/2020    History of Present Illness Pt is a 87 y.o. male with acute cholecystitis, complicated by pertinent comorbidities including hypertension, chronic kidney disease stage III, COPD, coronary tree disease, hyperlipidemia. s/p percutaneous cholecystostomy 12/09/20.    PT Comments    Patient received in recliner. He is agreeable to PT session. Reports he is feeling better than yesterday. No pain reported. He transfers sit to stand with supervision and cues for hand placement. Patient ambulated 70 feet with RW. Requires cues for staying close to RW and min guard for safety. Patient is limited by fatigue. He performed sit to supine with mod independence. Patient will benefit from continued skilled PT while here to improve strength, safety and activity tolerance.    Follow Up Recommendations  Home health PT;Supervision/Assistance - 24 hour;Supervision for mobility/OOB     Equipment Recommendations  None recommended by PT - has rollator at home   Recommendations for Other Services       Precautions / Restrictions Precautions Precautions: Fall Precaution Comments: drain Restrictions Weight Bearing Restrictions: No    Mobility  Bed Mobility Overal bed mobility: Modified Independent Bed Mobility: Sit to Supine       Sit to supine: Modified independent (Device/Increase time)        Transfers Overall transfer level: Needs assistance Equipment used: Rolling walker (2 wheeled) Transfers: Sit to/from Stand Sit to Stand: Supervision         General transfer comment: Cues for hand placement. Supervision  Ambulation/Gait Ambulation/Gait assistance: Min guard Gait Distance (Feet): 70 Feet Assistive device: Rolling walker (2 wheeled)   Gait velocity: decr   General Gait Details: cues for staying close to RW. Slow shuffle gait.   Stairs              Wheelchair Mobility    Modified Rankin (Stroke Patients Only)       Balance Overall balance assessment: Needs assistance Sitting-balance support: Feet supported Sitting balance-Leahy Scale: Good     Standing balance support: Bilateral upper extremity supported;During functional activity Standing balance-Leahy Scale: Fair Standing balance comment: reliant on B UE support and min guard for safety                            Cognition Arousal/Alertness: Awake/alert Behavior During Therapy: WFL for tasks assessed/performed Overall Cognitive Status: Within Functional Limits for tasks assessed                                 General Comments: Pt alert and oriented      Exercises      General Comments        Pertinent Vitals/Pain Pain Assessment: No/denies pain    Home Living                      Prior Function            PT Goals (current goals can now be found in the care plan section) Acute Rehab PT Goals Patient Stated Goal: to go home PT Goal Formulation: With patient Time For Goal Achievement: 12/23/20 Potential to Achieve Goals: Good Progress towards PT goals: Progressing toward goals    Frequency    Min 2X/week      PT Plan Discharge plan needs to be updated  Co-evaluation              AM-PAC PT "6 Clicks" Mobility   Outcome Measure  Help needed turning from your back to your side while in a flat bed without using bedrails?: None Help needed moving from lying on your back to sitting on the side of a flat bed without using bedrails?: None Help needed moving to and from a bed to a chair (including a wheelchair)?: A Little Help needed standing up from a chair using your arms (e.g., wheelchair or bedside chair)?: A Little Help needed to walk in hospital room?: A Little Help needed climbing 3-5 steps with a railing? : A Lot 6 Click Score: 19    End of Session Equipment Utilized During Treatment: Gait  belt Activity Tolerance: Patient limited by fatigue Patient left: in bed;with bed alarm set;with call bell/phone within reach Nurse Communication: Mobility status PT Visit Diagnosis: Other abnormalities of gait and mobility (R26.89);Muscle weakness (generalized) (M62.81)     Time: 2574-9355 PT Time Calculation (min) (ACUTE ONLY): 18 min  Charges:  $Gait Training: 8-22 mins                     Asharia Lotter, PT, GCS 12/14/20,11:18 AM

## 2020-12-14 NOTE — Discharge Summary (Signed)
Physician Discharge Summary  Lonnie [name]. XVQ:[id number] DOB: 1927-04-10 DOA: 12/08/2020  PCP: Derinda Late, MD  Admit date: 12/08/2020 Discharge date: 12/14/2020  Admitted From: home Discharge disposition: home   Recommendations for Outpatient Follow-Up:   1. Home health 2. Drain per general surgery 3. BMP 1 week   Discharge Diagnosis:   Principal Problem:   Cholecystitis Active Problems:   Pneumonia   BPH with obstruction/lower urinary tract symptoms   Stage 3 chronic kidney disease (HCC)   Elevated prostate specific antigen (PSA)   Hyperlipidemia   Hypertension   Hypotonicity of bladder   Basal cell carcinoma (BCC) of left ear   Acute cholecystitis    Discharge Condition: Improved.  Diet recommendation: Low residue  Wound care: None.  Code status: Full.   History of Present Illness:   Lonnie [name]. is a 87 y.o. male with medical history significant for basal cell carcinoma status post skin excision, BPH, hypertension, CKD 3, COPD, coronary artery disease, hyperlipidemia, presented to the emergency department for chief concerns of abdominal pain.  Lonnie [name] reports the abdominal pain is sharp and achy and started about 2 days ago.  He denies fever and endorses chills.  He denies radiation.  He states that the pain is right upper quadrant and central abdominal.  He reports he has never felt this way before.  He reports that the pain has improved since being in the emergency department.  He denies dysuria, hematuria, chest pain, shortness of breath, vision changes, rashes.  He has chronic constipation and reports that his last bowel movement was 4 to 5 days ago.  Social History: He lives by himself.  He quit tobacco in his late 53s.  Formerly he smoked 3 ppd.  He endorses infrequent etoh once every three weeks.  He is retired and formerly worked as a Financial trader.    He reports that his last PO meal was noon/lunch 12/07/20.      Hospital Course by Problem:   Cholecystitis- Meets sepsis criteria withsinus tachycardia, elevated WBC, intra-abdominal source, elevated lactic acid -Right upper quadrant ultrasound read as acalculus cholecystitis -General surgery consult appreciated:  IR did perc drain- culture: RARE ENTEROCOCCUS FAECIUM Per GS on 3/11: sign of persistent cystic duct closure. I recommend to continue drain to gravity. From the cholecystitis standpoint this has resolved and no surgical management will be needed during this admission. When patient medically stable I will coordinate outpatient follow-up with interventional radiology for evaluation of the patency of the cystic duct. -Continue antibiotics to finish course PO -Blood cultures x2- NGTD  Acute respiratory failure due to Pulmonary edema from volume resuscitation -1 x dose of IV lasix -wean to room air -IS -aggressive ambulation  Hypertension-controlled - nifedipine 60 mg  -stop lisinopril  Hyperlipidemia -atorvastatin 40 mg daily resumed  Chronic constipation -GlycoLax nightly daily -Resumed Senokot and Dulcolax  Basal cell carcinoma s/p skin excision on 12/01/20 -abx above -picture placed in chart [facility] consult appreciated   Chronic gout -allopurinol 300 mg daily resumed  AKI on CKD stage IIIb -baseline CR 1.3 -daily labs       Medical Consultants:   IR GS   Discharge Exam:   Vitals:   12/14/20 1033 12/14/20 1111  BP: 140/64 138/65  Pulse: 74 74  Resp: 19   Temp: 98.1 F (36.7 C) (!) 97.3 F (36.3 C)  SpO2: 97% 97%   Vitals:   12/14/20 0033 12/14/20 0408 12/14/20 1033 12/14/20  1111  BP: (!) 143/59 (!) 141/62 140/64 138/65  Pulse: 72 70 74 74  Resp: 18 20 19    Temp: 98.4 F (36.9 C) 97.8 F (36.6 C) 98.1 F (36.7 C) (!) 97.3 F (36.3 C)  TempSrc: Oral Oral Oral Oral  SpO2: 96% 96% 97% 97%  Weight:      Height:        General exam: Appears calm and comfortable.   The results of  significant diagnostics from this hospitalization (including imaging, microbiology, ancillary and laboratory) are listed below for reference.     Procedures and Diagnostic Studies:   [place] Intraoperative  Result Date: 12/09/2020 CLINICAL DATA:  Ultrasound was provided for use by the ordering physician.  No provider Interpretation or professional fees incurred.    CT ABDOMEN PELVIS W CONTRAST  Result Date: 12/08/2020 CLINICAL DATA:  Abdominal pain with distension constipation, bowel obstruction suspected. EXAM: CT ABDOMEN AND PELVIS WITH CONTRAST TECHNIQUE: Multidetector CT imaging of the abdomen and pelvis was performed using the standard protocol following bolus administration of intravenous contrast. CONTRAST:  149mL OMNIPAQUE IOHEXOL 300 MG/ML  SOLN COMPARISON:  Chest CT March 06, 2019 FINDINGS: Lower chest: New nodular left lower lobe ground-glass opacity. Bibasilar bronchiectasis with areas of mucoid impaction. Bilateral calcified pleural plaques. Normal size heart. Coronary artery calcifications. Mitral annulus calcifications. No significant pericardial effusion or thickening. Hepatobiliary: No suspicious hepatic lesion. Gallbladder is distended with gallbladder wall thickening and pericholecystic fluid. No biliary ductal dilation. Pancreas: Unremarkable. Spleen: Unremarkable Adrenals/Urinary Tract: Bilateral adrenal glands are unremarkable. No hydronephrosis. Bilateral cortical renal scarring. Bilateral tiny subcentimeter hypodense renal lesions which are technically too small to accurately characterize but favored represent cysts. Postsurgical changes of urolift procedure. Mild trabecular thickening of the urinary bladder. Right posterior bladder diverticulum versus pseudo ureterocele on image 82/2. Stomach/Bowel: Stomach is decompressed limiting evaluation. Normal positioning of the duodenum/ligament of Treitz. No evidence of bowel obstruction. The appendix is surgically absent per report. Moderate  volume of formed stool throughout the colon. Colonic diverticulosis without findings of acute diverticulitis. Vascular/Lymphatic: Aortic atherosclerosis. No enlarged abdominal or pelvic lymph nodes. Reproductive: Prostate is present with changes of recent urolift procedure. Other: No abdominopelvic ascites. Musculoskeletal: Multilevel degenerative changes spine. Degenerative change of the bilateral hips. No acute osseous abnormality. IMPRESSION: 1. Distended gallbladder with gallbladder wall thickening and pericholecystic fluid. Findings are concerning for acute cholecystitis. 2. New nodular left lower lobe ground-glass opacity may represent pneumonia recommend follow-up dedicated chest CT in 2-3 months to ensure resolution. 3. Postsurgical changes urolift insertion with nodular thickening of the adjacent right side pelvic musculature, which may be sequela of the procedure. Recommend attention on follow-up imaging. 4. Colonic diverticulosis without findings of acute diverticulitis. 5. Moderate volume of formed stool throughout the colon. 6. Aortic atherosclerosis.  Aortic Atherosclerosis (ICD10-I70.0). These results were called by telephone at the time of interpretation on 12/08/2020 at 11:04 am to provider MARK QUALE , who verbally acknowledged these results. Electronically Signed   By: Dahlia Bailiff MD   On: 12/08/2020 11:06   DG Abdomen Acute W/Chest  Result Date: 12/08/2020 CLINICAL DATA:  Distended abdomen EXAM: DG ABDOMEN ACUTE WITH 1 VIEW CHEST COMPARISON:  Chest x-ray 08/11/2018 FINDINGS: Heart size and vascularity normal. Negative for heart failure. Negative for pneumonia. Large calcified plaque overlying the left chest. Calcified pleural plaque also noted on the right. Mild bibasilar atelectasis. Normal bowel gas pattern. Negative for bowel obstruction or ileus. No free air. No urinary tract calculi. Surgical clips in the  region of the prostate. Mild degenerative change lumbar spine. IMPRESSION: Mild  bibasilar atelectasis. Bilateral calcified pleural plaques, chronic Normal bowel gas pattern. Electronically Signed   By: Franchot Gallo M.D.   On: 12/08/2020 10:00   CT IMAGE GUIDED DRAINAGE BY PERCUTANEOUS CATHETER  Result Date: 12/09/2020 INDICATION: Acute cholecystitis. Poor operative candidate. Please perform image guided cholecystostomy tube placement for infection source control purposes. EXAM: CT AND FLUOROSCOPIC-GUIDED CHOLECYSTOSTOMY TUBE PLACEMENT COMPARISON:  CT abdomen and pelvis-12/08/2020; right upper quadrant abdominal ultrasound-12/08/2020 MEDICATIONS: The patient is currently admitted to the hospital and on intravenous antibiotics. Antibiotics were administered within an appropriate time frame prior to skin puncture. ANESTHESIA/SEDATION: Moderate (conscious) sedation was employed during this procedure. A total of Versed 1 mg and Fentanyl 100 mcg was administered intravenously. Moderate Sedation Time: 23 minutes. The patient's level of consciousness and vital signs were monitored continuously by radiology nursing throughout the procedure under my direct supervision. CONTRAST:  None FLUOROSCOPY TIME:  Not provided COMPLICATIONS: None immediate. PROCEDURE: Informed written consent was obtained from the patient after a discussion of the risks, benefits and alternatives to treatment. Questions regarding the procedure were encouraged and answered. A timeout was performed prior to the initiation of the procedure. Patient was positioned supine on the CT gantry and noncontrast images were obtained of the abdomen demonstrating similar appearance of the mildly distended gallbladder with associated gallbladder wall thickening and minimal amount of pericholecystic stranding. The gallbladder was then identified sonographically. The right upper abdominal quadrant was prepped and draped in the usual sterile fashion, and a sterile drape was applied covering the operative field. Maximum barrier sterile technique  with sterile gowns and gloves were used for the procedure. A timeout was performed prior to the initiation of the procedure. Local anesthesia was provided with 1% lidocaine with epinephrine. Utilizing a transhepatic approach, an 18 gauge needle was advanced into the gallbladder under direct ultrasound guidance. An ultrasound image was saved for documentation purposes. Appropriate position was confirmed with CT imaging Next, the track was dilated allowing placement of a 10.2-French Cook cholecystomy tube was advanced into the gallbladder lumen, coiled and locked. A small amount of aspirated bile was capped and sent to the laboratory for analysis. The catheter was secured to the skin with suture, connected to a drainage bag and a dressing was placed. The patient tolerated the procedure well without immediate post procedural complication. IMPRESSION: Successful ultrasound and CT guided placement of a 10.2 French cholecystostomy tube. Small amount of aspirated bile was sent to the laboratory for analysis. Electronically Signed   By: Sandi Mariscal M.D.   On: 12/09/2020 12:48   US ABDOMEN LIMITED RUQ (LIVER/GB)  Result Date: 12/08/2020 CLINICAL DATA:  Abdominal pain for the past day. EXAM: ULTRASOUND ABDOMEN LIMITED RIGHT UPPER QUADRANT COMPARISON:  CT abdomen pelvis from same day. FINDINGS: Gallbladder: Gallbladder wall thickening and pericholecystic fluid. No gallstones. No sonographic Murphy sign noted by sonographer. Common bile duct: Diameter: 3 mm, normal. Liver: No focal lesion identified. Within normal limits in parenchymal echogenicity. Portal vein is patent on color Doppler imaging with normal direction of blood flow towards the liver. Other: None. IMPRESSION: 1. Gallbladder wall thickening and pericholecystic fluid without cholelithiasis, concerning for acalculus cholecystitis. Electronically Signed   By: Titus Dubin M.D.   On: 12/08/2020 15:04     Labs:   Basic Metabolic Panel: Recent Labs  Lab  12/10/20 0613 12/11/20 0421 12/12/20 0504 12/13/20 0422 12/14/20 0735  NA 137 138 136 138 140  K 3.8 3.8  3.7 3.8 4.2  CL 109 109 109 112* 114*  CO2 23 21* 20* 20* 20*  GLUCOSE 95 93 113* 109* 100*  BUN 26* 29* 38* 45* 40*  CREATININE 1.48* 1.65* 1.99* 1.75* 1.64*  CALCIUM 8.7* 8.9 8.7* 8.8* 8.6*   GFR Estimated Creatinine Clearance: 29.5 mL/min (A) (by C-G formula based on SCr of 1.64 mg/dL (H)). Liver Function Tests: Recent Labs  Lab 12/08/20 0901  AST 23  ALT 16  ALKPHOS 89  BILITOT 0.9  PROT 7.1  ALBUMIN 3.7   Recent Labs  Lab 12/08/20 0901  LIPASE 35   No results for input(s): AMMONIA in the last 168 hours. Coagulation profile Recent Labs  Lab 12/08/20 0901 12/09/20 0553  INR 1.1 1.2    CBC: Recent Labs  Lab 12/08/20 0901 12/10/20 0613 12/11/20 0421 12/12/20 0504  WBC 21.3* 11.2* 11.1* 8.4  NEUTROABS 18.9*  --   --   --   HGB 12.3* 9.9* 10.7* 10.2*  HCT 36.0* 30.1* 32.1* 31.2*  MCV 97.8 99.3 98.2 99.0  PLT 181 141* 166 171   Cardiac Enzymes: No results for input(s): CKTOTAL, CKMB, CKMBINDEX, TROPONINI in the last 168 hours. BNP: Invalid input(s): POCBNP CBG: No results for input(s): GLUCAP in the last 168 hours. D-Dimer No results for input(s): DDIMER in the last 72 hours. Hgb A1c No results for input(s): HGBA1C in the last 72 hours. Lipid Profile No results for input(s): CHOL, HDL, LDLCALC, TRIG, CHOLHDL, LDLDIRECT in the last 72 hours. Thyroid function studies No results for input(s): TSH, T4TOTAL, T3FREE, THYROIDAB in the last 72 hours.  Invalid input(s): FREET3 Anemia work up No results for input(s): VITAMINB12, FOLATE, FERRITIN, TIBC, IRON, RETICCTPCT in the last 72 hours. Microbiology Recent Results (from the past 240 hour(s))  SARS CORONAVIRUS 2 (TAT 6-24 HRS) Nasopharyngeal Nasopharyngeal Swab     Status: None   Collection Time: 12/08/20  1:00 PM   Specimen: Nasopharyngeal Swab  Result Value Ref Range Status   SARS  Coronavirus 2 NEGATIVE NEGATIVE Final    Comment: (NOTE) SARS-CoV-2 target nucleic acids are NOT DETECTED.  The SARS-CoV-2 RNA is generally detectable in upper and lower respiratory specimens during the acute phase of infection. Negative results do not preclude SARS-CoV-2 infection, do not rule out co-infections with other pathogens, and should not be used as the sole basis for treatment or other patient management decisions. Negative results must be combined with clinical observations, patient history, and epidemiological information. The expected result is Negative.  Fact Sheet for Patients: SugarRoll.be  Fact Sheet for Healthcare Providers: https://www.woods-mathews.com/  This test is not yet approved or cleared by the [place] FDA and  has been authorized for detection and/or diagnosis of SARS-CoV-2 by FDA under an Emergency Use Authorization (EUA). This EUA will remain  in effect (meaning this test can be used) for the duration of the COVID-19 declaration under Se ction 564(b)(1) of the Act, 21 U.S.C. section 360bbb-3(b)(1), unless the authorization is terminated or revoked sooner.  Performed at [facility], [place] [address], [place], [place] [address]   Culture, blood (Routine X 2) w Reflex to ID Panel     Status: None   Collection Time: 12/08/20  1:00 PM   Specimen: BLOOD  Result Value Ref Range Status   Specimen Description BLOOD BLOOD LEFT WRIST  Final   Special Requests   Final    BOTTLES DRAWN AEROBIC AND ANAEROBIC Blood Culture adequate volume   Culture   Final  NO GROWTH 5 DAYS Performed at [facility], [address] [place], [place] [address]    Report Status 12/13/2020 FINAL  Final  Culture, blood (Routine X 2) w Reflex to ID Panel     Status: None   Collection Time: 12/08/20  1:12 PM   Specimen: BLOOD  Result Value Ref Range Status   Specimen Description BLOOD LEFT ANTECUBITAL  Final    Special Requests   Final    BOTTLES DRAWN AEROBIC AND ANAEROBIC Blood Culture results may not be optimal due to an excessive volume of blood received in culture bottles   Culture   Final    NO GROWTH 5 DAYS Performed at [facility], [address] [place], [place] [address]    Report Status 12/13/2020 FINAL  Final  MRSA PCR Screening     Status: None   Collection Time: 12/08/20 11:03 PM   Specimen: Nasopharyngeal  Result Value Ref Range Status   MRSA by PCR NEGATIVE NEGATIVE Final    Comment:        The GeneXpert MRSA Assay (FDA approved for NASAL specimens only), is one component of a comprehensive MRSA colonization surveillance program. It is not intended to diagnose MRSA infection nor to guide or monitor treatment for MRSA infections. Performed at [facility], [address], [place], [place] [address]   Aerobic/Anaerobic Culture (surgical/deep wound)     Status: None   Collection Time: 12/09/20 12:20 PM   Specimen: Abscess  Result Value Ref Range Status   Specimen Description   Final    ABSCESS Performed at [facility], [address], [place], [place] [address]    Special Requests POST IMAGE GUIDED CHOLE TUBE PLACEMENT  Final   Gram Stain NO WBC SEEN NO ORGANISMS SEEN   Final   Culture   Final    RARE ENTEROCOCCUS FAECIUM NO ANAEROBES ISOLATED Performed at [facility], [place] [address], [place], [place] [address]    Report Status 12/14/2020 FINAL  Final   Organism ID, Bacteria ENTEROCOCCUS FAECIUM  Final      Susceptibility   Enterococcus faecium - MIC*    AMPICILLIN <=2 SENSITIVE Sensitive     VANCOMYCIN <=0.5 SENSITIVE Sensitive     GENTAMICIN SYNERGY SENSITIVE Sensitive     * RARE ENTEROCOCCUS FAECIUM     Discharge Instructions:   Discharge Instructions    Diet - low sodium heart healthy   Complete by: As directed    Discharge instructions   Complete by: As directed    Home health   Discharge wound care:    Complete by: As directed    Wound care to full thickness lesions behind left ear:  Cleanse with NS, pat dry. Top with xeroform gauze cover with dry gauze 2x2 and secure with paper tape.  Change twice daily and PRN dressing dislodgement   Increase activity slowly   Complete by: As directed      Allergies as of 12/14/2020      Reactions   Sulfa Antibiotics Other (See Comments)   Not sure of reaction. It was too long ago, but he said not to give it to him.    Sulfasalazine    Other reaction(s): Other (See Comments) Not sure of reaction. It was too long ago, but he said not to give it to him.    Penicillin G Rash   Tolerates ampicillin and amoxicillin      Medication List    STOP taking these  medications   ciprofloxacin 500 MG tablet Commonly known as: CIPRO   doxycycline 100 MG capsule Commonly known as: VIBRAMYCIN   lisinopril 40 MG tablet Commonly known as: ZESTRIL   ondansetron 4 MG disintegrating tablet Commonly known as: Zofran ODT     TAKE these medications   alfuzosin 10 MG 24 hr tablet Commonly known as: UROXATRAL Take 10 mg by mouth daily with breakfast.   allopurinol 300 MG tablet Commonly known as: ZYLOPRIM Take 300 mg by mouth every morning.   amoxicillin-clavulanate 500-125 MG tablet Commonly known as: AUGMENTIN Take 1 tablet (500 mg total) by mouth 2 (two) times daily.   aspirin 81 MG tablet Take 81 mg by mouth daily.   atorvastatin 40 MG tablet Commonly known as: LIPITOR Take 40 mg by mouth every morning.   bisacodyl 5 MG EC tablet Commonly known as: DULCOLAX Take 10 mg by mouth 2 (two) times daily as needed.   budesonide-formoterol 160-4.5 MCG/ACT inhaler Commonly known as: SYMBICORT Inhale 2 puffs into the lungs 2 (two) times daily.   Combivent Respimat 20-100 MCG/ACT Aers respimat Generic drug: Ipratropium-Albuterol Inhale 1 puff into the lungs every morning.   ferrous sulfate 325 (65 FE) MG tablet Take 325 mg by mouth 2 (two) times  daily with a meal.   finasteride 5 MG tablet Commonly known as: PROSCAR Take 5 mg by mouth every morning.   gabapentin 300 MG capsule Commonly known as: NEURONTIN Take 600 mg by mouth 2 (two) times daily.   Magnesium Oxide 250 MG Tabs Take 500 mg by mouth every morning.   melatonin 5 MG Tabs Take 5 mg by mouth at bedtime.   NIFEdipine 30 MG 24 hr tablet Commonly known as: PROCARDIA-XL/NIFEDICAL-XL Take 60 mg by mouth every morning.   nystatin powder Commonly known as: MYCOSTATIN/NYSTOP Apply topically 3 (three) times daily.   Omega-3 1000 MG Caps Take 1,000 mg by mouth daily.   omeprazole 20 MG capsule Commonly known as: PRILOSEC Take 20 mg by mouth 2 (two) times daily.   sennosides-docusate sodium 8.6-50 MG tablet Commonly known as: SENOKOT-S Take 2 tablets by mouth daily.   vitamin B-12 1000 MCG tablet Commonly known as: CYANOCOBALAMIN Take 1,000 mcg by mouth daily.   vitamin C 250 MG tablet Commonly known as: ASCORBIC ACID Take 250 mg by mouth 2 (two) times daily.            Discharge Care Instructions  (From admission, onward)         Start     Ordered   12/14/20 0000  Discharge wound care:       Comments: Wound care to full thickness lesions behind left ear:  Cleanse with NS, pat dry. Top with xeroform gauze cover with dry gauze 2x2 and secure with paper tape.  Change twice daily and PRN dressing dislodgement   12/14/20 1125          Follow-up Information    Derinda Late, MD In 1 week.   Specialty: Family Medicine Why: BMP 1 week Contact information: [address] [facility] [place] [address] [phone]                Time coordinating discharge: 35 min  Signed:  Geradine Girt DO  [facility] 12/14/2020, 5:15 PM

## 2020-12-16 ENCOUNTER — Inpatient Hospital Stay
Admission: EM | Admit: 2020-12-16 | Discharge: 2020-12-24 | DRG: 444 | Disposition: A | Discharge status: Skilled Nursing Facility | Payer: No Typology Code available for payment source | Attending: Internal Medicine | Admitting: Internal Medicine

## 2020-12-16 ENCOUNTER — Emergency Department: Payer: No Typology Code available for payment source

## 2020-12-16 ENCOUNTER — Other Ambulatory Visit: Payer: Self-pay

## 2020-12-16 DIAGNOSIS — I447 Left bundle-branch block, unspecified: Secondary | ICD-10-CM | POA: Diagnosis present

## 2020-12-16 DIAGNOSIS — R000 Tachycardia, unspecified: Secondary | ICD-10-CM | POA: Diagnosis present

## 2020-12-16 DIAGNOSIS — I10 Essential (primary) hypertension: Secondary | ICD-10-CM | POA: Diagnosis not present

## 2020-12-16 DIAGNOSIS — D72829 Elevated white blood cell count, unspecified: Secondary | ICD-10-CM | POA: Diagnosis not present

## 2020-12-16 DIAGNOSIS — R112 Nausea with vomiting, unspecified: Secondary | ICD-10-CM

## 2020-12-16 DIAGNOSIS — N189 Chronic kidney disease, unspecified: Secondary | ICD-10-CM

## 2020-12-16 DIAGNOSIS — Z79899 Other long term (current) drug therapy: Secondary | ICD-10-CM

## 2020-12-16 DIAGNOSIS — N1831 Chronic kidney disease, stage 3a: Secondary | ICD-10-CM | POA: Diagnosis present

## 2020-12-16 DIAGNOSIS — Z8249 Family history of ischemic heart disease and other diseases of the circulatory system: Secondary | ICD-10-CM

## 2020-12-16 DIAGNOSIS — N401 Enlarged prostate with lower urinary tract symptoms: Secondary | ICD-10-CM | POA: Diagnosis not present

## 2020-12-16 DIAGNOSIS — B370 Candidal stomatitis: Secondary | ICD-10-CM | POA: Diagnosis present

## 2020-12-16 DIAGNOSIS — Z7189 Other specified counseling: Secondary | ICD-10-CM | POA: Diagnosis not present

## 2020-12-16 DIAGNOSIS — J181 Lobar pneumonia, unspecified organism: Secondary | ICD-10-CM | POA: Diagnosis present

## 2020-12-16 DIAGNOSIS — Z20822 Contact with and (suspected) exposure to covid-19: Secondary | ICD-10-CM | POA: Diagnosis present

## 2020-12-16 DIAGNOSIS — N170 Acute kidney failure with tubular necrosis: Secondary | ICD-10-CM | POA: Diagnosis present

## 2020-12-16 DIAGNOSIS — E860 Dehydration: Secondary | ICD-10-CM | POA: Diagnosis present

## 2020-12-16 DIAGNOSIS — Z882 Allergy status to sulfonamides status: Secondary | ICD-10-CM

## 2020-12-16 DIAGNOSIS — I700 Atherosclerosis of aorta: Secondary | ICD-10-CM | POA: Diagnosis present

## 2020-12-16 DIAGNOSIS — R109 Unspecified abdominal pain: Secondary | ICD-10-CM | POA: Diagnosis not present

## 2020-12-16 DIAGNOSIS — Z515 Encounter for palliative care: Secondary | ICD-10-CM | POA: Diagnosis not present

## 2020-12-16 DIAGNOSIS — N138 Other obstructive and reflux uropathy: Secondary | ICD-10-CM | POA: Diagnosis not present

## 2020-12-16 DIAGNOSIS — I472 Ventricular tachycardia: Secondary | ICD-10-CM

## 2020-12-16 DIAGNOSIS — N179 Acute kidney failure, unspecified: Secondary | ICD-10-CM | POA: Diagnosis not present

## 2020-12-16 DIAGNOSIS — I129 Hypertensive chronic kidney disease with stage 1 through stage 4 chronic kidney disease, or unspecified chronic kidney disease: Secondary | ICD-10-CM | POA: Diagnosis present

## 2020-12-16 DIAGNOSIS — Z85828 Personal history of other malignant neoplasm of skin: Secondary | ICD-10-CM | POA: Diagnosis not present

## 2020-12-16 DIAGNOSIS — Z87891 Personal history of nicotine dependence: Secondary | ICD-10-CM | POA: Diagnosis not present

## 2020-12-16 DIAGNOSIS — K5900 Constipation, unspecified: Secondary | ICD-10-CM | POA: Diagnosis not present

## 2020-12-16 DIAGNOSIS — R339 Retention of urine, unspecified: Secondary | ICD-10-CM | POA: Diagnosis present

## 2020-12-16 DIAGNOSIS — M109 Gout, unspecified: Secondary | ICD-10-CM | POA: Diagnosis present

## 2020-12-16 DIAGNOSIS — I2510 Atherosclerotic heart disease of native coronary artery without angina pectoris: Secondary | ICD-10-CM | POA: Diagnosis present

## 2020-12-16 DIAGNOSIS — Z7982 Long term (current) use of aspirin: Secondary | ICD-10-CM

## 2020-12-16 DIAGNOSIS — Z7951 Long term (current) use of inhaled steroids: Secondary | ICD-10-CM

## 2020-12-16 DIAGNOSIS — J440 Chronic obstructive pulmonary disease with acute lower respiratory infection: Secondary | ICD-10-CM | POA: Diagnosis present

## 2020-12-16 DIAGNOSIS — B952 Enterococcus as the cause of diseases classified elsewhere: Secondary | ICD-10-CM | POA: Diagnosis present

## 2020-12-16 DIAGNOSIS — D631 Anemia in chronic kidney disease: Secondary | ICD-10-CM | POA: Diagnosis present

## 2020-12-16 DIAGNOSIS — Z4659 Encounter for fitting and adjustment of other gastrointestinal appliance and device: Secondary | ICD-10-CM

## 2020-12-16 DIAGNOSIS — R338 Other retention of urine: Secondary | ICD-10-CM | POA: Insufficient documentation

## 2020-12-16 DIAGNOSIS — N183 Chronic kidney disease, stage 3 unspecified: Secondary | ICD-10-CM | POA: Diagnosis present

## 2020-12-16 DIAGNOSIS — I4891 Unspecified atrial fibrillation: Secondary | ICD-10-CM

## 2020-12-16 DIAGNOSIS — K567 Ileus, unspecified: Secondary | ICD-10-CM | POA: Diagnosis not present

## 2020-12-16 DIAGNOSIS — K810 Acute cholecystitis: Principal | ICD-10-CM | POA: Diagnosis present

## 2020-12-16 DIAGNOSIS — R531 Weakness: Secondary | ICD-10-CM | POA: Diagnosis present

## 2020-12-16 DIAGNOSIS — K219 Gastro-esophageal reflux disease without esophagitis: Secondary | ICD-10-CM | POA: Diagnosis present

## 2020-12-16 DIAGNOSIS — N400 Enlarged prostate without lower urinary tract symptoms: Secondary | ICD-10-CM

## 2020-12-16 DIAGNOSIS — R1110 Vomiting, unspecified: Secondary | ICD-10-CM

## 2020-12-16 DIAGNOSIS — R140 Abdominal distension (gaseous): Secondary | ICD-10-CM

## 2020-12-16 DIAGNOSIS — Z880 Allergy status to penicillin: Secondary | ICD-10-CM

## 2020-12-16 DIAGNOSIS — K651 Peritoneal abscess: Secondary | ICD-10-CM | POA: Diagnosis present

## 2020-12-16 DIAGNOSIS — E785 Hyperlipidemia, unspecified: Secondary | ICD-10-CM | POA: Diagnosis present

## 2020-12-16 LAB — URINALYSIS, COMPLETE (UACMP) WITH MICROSCOPIC
Bilirubin Urine: NEGATIVE
Glucose, UA: NEGATIVE mg/dL
Hgb urine dipstick: NEGATIVE
Nitrite: NEGATIVE
Protein, ur: 30 mg/dL — AB
Specific Gravity, Urine: 1.02 (ref 1.005–1.030)
pH: 5 (ref 5.0–8.0)

## 2020-12-16 LAB — HEPATIC FUNCTION PANEL
ALT: 22 U/L (ref 0–44)
AST: 26 U/L (ref 15–41)
Albumin: 3.1 g/dL — ABNORMAL LOW (ref 3.5–5.0)
Alkaline Phosphatase: 62 U/L (ref 38–126)
Bilirubin, Direct: 0.1 mg/dL (ref 0.0–0.2)
Indirect Bilirubin: 0.5 mg/dL (ref 0.3–0.9)
Total Bilirubin: 0.6 mg/dL (ref 0.3–1.2)
Total Protein: 6.3 g/dL — ABNORMAL LOW (ref 6.5–8.1)

## 2020-12-16 LAB — CBC
HCT: 30.9 % — ABNORMAL LOW (ref 39.0–52.0)
Hemoglobin: 10.1 g/dL — ABNORMAL LOW (ref 13.0–17.0)
MCH: 32.9 pg (ref 26.0–34.0)
MCHC: 32.7 g/dL (ref 30.0–36.0)
MCV: 100.7 fL — ABNORMAL HIGH (ref 80.0–100.0)
Platelets: 210 10*3/uL (ref 150–400)
RBC: 3.07 MIL/uL — ABNORMAL LOW (ref 4.22–5.81)
RDW: 13.7 % (ref 11.5–15.5)
WBC: 16.7 10*3/uL — ABNORMAL HIGH (ref 4.0–10.5)
nRBC: 0 % (ref 0.0–0.2)

## 2020-12-16 LAB — BASIC METABOLIC PANEL
Anion gap: 10 (ref 5–15)
Anion gap: 7 (ref 5–15)
BUN: 56 mg/dL — ABNORMAL HIGH (ref 8–23)
BUN: 57 mg/dL — ABNORMAL HIGH (ref 8–23)
CO2: 15 mmol/L — ABNORMAL LOW (ref 22–32)
CO2: 16 mmol/L — ABNORMAL LOW (ref 22–32)
Calcium: 8.4 mg/dL — ABNORMAL LOW (ref 8.9–10.3)
Calcium: 8.1 mg/dL — ABNORMAL LOW (ref 8.9–10.3)
Chloride: 113 mmol/L — ABNORMAL HIGH (ref 98–111)
Chloride: 113 mmol/L — ABNORMAL HIGH (ref 98–111)
Creatinine, Ser: 2.51 mg/dL — ABNORMAL HIGH (ref 0.61–1.24)
Creatinine, Ser: 2.6 mg/dL — ABNORMAL HIGH (ref 0.61–1.24)
GFR, Estimated: 23 mL/min — ABNORMAL LOW (ref >60)
GFR, Estimated: 22 mL/min — ABNORMAL LOW (ref >60)
Glucose, Bld: 170 mg/dL — ABNORMAL HIGH (ref 70–99)
Glucose, Bld: 109 mg/dL — ABNORMAL HIGH (ref 70–99)
Potassium: 4.1 mmol/L (ref 3.5–5.1)
Potassium: 4.3 mmol/L (ref 3.5–5.1)
Sodium: 138 mmol/L (ref 135–145)
Sodium: 136 mmol/L (ref 135–145)

## 2020-12-16 LAB — LACTIC ACID, PLASMA: Lactic Acid, Venous: 1.3 mmol/L (ref 0.5–1.9)

## 2020-12-16 LAB — LIPASE, BLOOD: Lipase: 89 U/L — ABNORMAL HIGH (ref 11–51)

## 2020-12-16 LAB — MAGNESIUM: Magnesium: 3 mg/dL — ABNORMAL HIGH (ref 1.7–2.4)

## 2020-12-16 MED ORDER — MAGNESIUM SULFATE 2 GM/50ML IV SOLN
INTRAVENOUS | Status: AC
  Administered 2020-12-16: 2 g via INTRAVENOUS
  Filled 2020-12-16: qty 50

## 2020-12-16 MED ORDER — NYSTATIN 100000 UNIT/GM EX POWD
Freq: Three times a day (TID) | CUTANEOUS | Status: DC
  Filled 2020-12-16 (×3): qty 15

## 2020-12-16 MED ORDER — NIFEDIPINE ER 60 MG PO TB24
60.0000 mg | ORAL_TABLET | Freq: Every morning | ORAL | Status: DC
  Administered 2020-12-17 – 2020-12-24 (×7): 60 mg via ORAL
  Filled 2020-12-16 (×8): qty 1

## 2020-12-16 MED ORDER — OMEGA-3 1000 MG PO CAPS: 1000.0000 mg | ORAL_CAPSULE | Freq: Every day | ORAL | Status: DC

## 2020-12-16 MED ORDER — IPRATROPIUM-ALBUTEROL 20-100 MCG/ACT IN AERS
1.0000 | INHALATION_SPRAY | Freq: Every day | RESPIRATORY_TRACT | Status: DC
  Administered 2020-12-17 – 2020-12-18 (×2): 1 via RESPIRATORY_TRACT
  Filled 2020-12-16: qty 4

## 2020-12-16 MED ORDER — MORPHINE SULFATE (PF) 2 MG/ML IV SOLN
1.0000 mg | INTRAVENOUS | Status: DC | PRN
  Administered 2020-12-20 (×2): 1 mg via INTRAVENOUS
  Filled 2020-12-16 (×2): qty 1

## 2020-12-16 MED ORDER — PANTOPRAZOLE SODIUM 40 MG PO TBEC
40.0000 mg | DELAYED_RELEASE_TABLET | Freq: Every day | ORAL | Status: DC
  Administered 2020-12-17 – 2020-12-20 (×4): 40 mg via ORAL
  Filled 2020-12-16 (×4): qty 1

## 2020-12-16 MED ORDER — MOMETASONE FURO-FORMOTEROL FUM 200-5 MCG/ACT IN AERO
2.0000 | INHALATION_SPRAY | Freq: Two times a day (BID) | RESPIRATORY_TRACT | Status: DC
  Administered 2020-12-17 – 2020-12-24 (×14): 2 via RESPIRATORY_TRACT
  Filled 2020-12-16 (×2): qty 8.8

## 2020-12-16 MED ORDER — BISACODYL 10 MG RE SUPP
10.0000 mg | Freq: Once | RECTAL | Status: AC
  Administered 2020-12-16: 10 mg via RECTAL
  Filled 2020-12-16: qty 1

## 2020-12-16 MED ORDER — MAGNESIUM SULFATE 2 GM/50ML IV SOLN: 2.0000 g | Freq: Once | INTRAVENOUS | Status: AC

## 2020-12-16 MED ORDER — MELATONIN 5 MG PO TABS
5.0000 mg | ORAL_TABLET | Freq: Every day | ORAL | Status: DC
  Administered 2020-12-16 – 2020-12-23 (×8): 5 mg via ORAL
  Filled 2020-12-16 (×11): qty 1

## 2020-12-16 MED ORDER — ALLOPURINOL 300 MG PO TABS
150.0000 mg | ORAL_TABLET | Freq: Every morning | ORAL | Status: DC
  Administered 2020-12-17 – 2020-12-18 (×2): 150 mg via ORAL
  Filled 2020-12-16 (×2): qty 0.5

## 2020-12-16 MED ORDER — AMIODARONE IV BOLUS ONLY 150 MG/100ML
INTRAVENOUS | Status: AC
  Filled 2020-12-16: qty 100

## 2020-12-16 MED ORDER — SODIUM CHLORIDE 0.9 % IV SOLN
3.0000 g | Freq: Two times a day (BID) | INTRAVENOUS | Status: DC
  Administered 2020-12-16 – 2020-12-22 (×12): 3 g via INTRAVENOUS
  Filled 2020-12-16 (×3): qty 3
  Filled 2020-12-16 (×3): qty 8
  Filled 2020-12-16 (×2): qty 3
  Filled 2020-12-16 (×2): qty 8
  Filled 2020-12-16 (×3): qty 3

## 2020-12-16 MED ORDER — ENOXAPARIN SODIUM 30 MG/0.3ML ~~LOC~~ SOLN
30.0000 mg | SUBCUTANEOUS | Status: DC
  Administered 2020-12-17 – 2020-12-21 (×5): 30 mg via SUBCUTANEOUS
  Filled 2020-12-16 (×6): qty 0.3

## 2020-12-16 MED ORDER — ATORVASTATIN CALCIUM 20 MG PO TABS
40.0000 mg | ORAL_TABLET | Freq: Every morning | ORAL | Status: DC
  Administered 2020-12-17 – 2020-12-24 (×7): 40 mg via ORAL
  Filled 2020-12-16 (×6): qty 2

## 2020-12-16 MED ORDER — ASPIRIN EC 81 MG PO TBEC
81.0000 mg | DELAYED_RELEASE_TABLET | Freq: Every day | ORAL | Status: DC
  Administered 2020-12-17 – 2020-12-24 (×7): 81 mg via ORAL
  Filled 2020-12-16 (×7): qty 1

## 2020-12-16 MED ORDER — SODIUM CHLORIDE 0.9 % IV SOLN: Freq: Once | INTRAVENOUS | Status: AC

## 2020-12-16 MED ORDER — SODIUM CHLORIDE 0.9 % IV BOLUS
500.0000 mL | Freq: Once | INTRAVENOUS | Status: AC
  Administered 2020-12-16: 500 mL via INTRAVENOUS

## 2020-12-16 MED ORDER — AMIODARONE IV BOLUS ONLY 150 MG/100ML
150.0000 mg | Freq: Once | INTRAVENOUS | Status: AC
  Administered 2020-12-16: 150 mg via INTRAVENOUS

## 2020-12-16 MED ORDER — FERROUS SULFATE 325 (65 FE) MG PO TABS
325.0000 mg | ORAL_TABLET | Freq: Two times a day (BID) | ORAL | Status: DC
  Administered 2020-12-17 – 2020-12-20 (×8): 325 mg via ORAL
  Filled 2020-12-16 (×8): qty 1

## 2020-12-16 MED ORDER — ASCORBIC ACID 500 MG PO TABS
250.0000 mg | ORAL_TABLET | Freq: Two times a day (BID) | ORAL | Status: DC
  Administered 2020-12-16 – 2020-12-20 (×9): 250 mg via ORAL
  Filled 2020-12-16 (×9): qty 1

## 2020-12-16 MED ORDER — FINASTERIDE 5 MG PO TABS
5.0000 mg | ORAL_TABLET | ORAL | Status: DC
  Administered 2020-12-17 – 2020-12-24 (×7): 5 mg via ORAL
  Filled 2020-12-16 (×7): qty 1

## 2020-12-16 MED ORDER — ONDANSETRON HCL 4 MG PO TABS
4.0000 mg | ORAL_TABLET | Freq: Four times a day (QID) | ORAL | Status: DC | PRN
  Administered 2020-12-19: 4 mg via ORAL
  Filled 2020-12-16: qty 1

## 2020-12-16 MED ORDER — SENNOSIDES-DOCUSATE SODIUM 8.6-50 MG PO TABS
2.0000 | ORAL_TABLET | Freq: Every day | ORAL | Status: DC
  Administered 2020-12-17 – 2020-12-24 (×7): 2 via ORAL
  Filled 2020-12-16 (×7): qty 2

## 2020-12-16 MED ORDER — ALFUZOSIN HCL ER 10 MG PO TB24
10.0000 mg | ORAL_TABLET | Freq: Every day | ORAL | Status: DC
  Administered 2020-12-17 – 2020-12-24 (×7): 10 mg via ORAL
  Filled 2020-12-16 (×8): qty 1

## 2020-12-16 MED ORDER — SODIUM CHLORIDE 0.9 % IV SOLN: INTRAVENOUS | Status: DC

## 2020-12-16 MED ORDER — BISACODYL 5 MG PO TBEC
10.0000 mg | DELAYED_RELEASE_TABLET | Freq: Two times a day (BID) | ORAL | Status: DC | PRN
  Administered 2020-12-20 – 2020-12-23 (×3): 10 mg via ORAL
  Filled 2020-12-16 (×3): qty 2

## 2020-12-16 MED ORDER — VITAMIN B-12 1000 MCG PO TABS
1000.0000 ug | ORAL_TABLET | Freq: Every day | ORAL | Status: DC
  Administered 2020-12-16 – 2020-12-24 (×8): 1000 ug via ORAL
  Filled 2020-12-16 (×8): qty 1

## 2020-12-16 MED ORDER — GABAPENTIN 300 MG PO CAPS
300.0000 mg | ORAL_CAPSULE | Freq: Two times a day (BID) | ORAL | Status: DC
  Administered 2020-12-16 – 2020-12-18 (×4): 300 mg via ORAL
  Filled 2020-12-16 (×4): qty 1

## 2020-12-16 MED ORDER — MAGNESIUM OXIDE 400 (241.3 MG) MG PO TABS
400.0000 mg | ORAL_TABLET | Freq: Every morning | ORAL | Status: DC
  Administered 2020-12-17 – 2020-12-24 (×7): 400 mg via ORAL
  Filled 2020-12-16 (×6): qty 1

## 2020-12-16 MED ORDER — PIPERACILLIN-TAZOBACTAM 3.375 G IVPB 30 MIN
3.3750 g | Freq: Once | INTRAVENOUS | Status: AC
  Administered 2020-12-16: 3.375 g via INTRAVENOUS
  Filled 2020-12-16: qty 50

## 2020-12-16 MED ORDER — ONDANSETRON HCL 4 MG/2ML IJ SOLN
4.0000 mg | Freq: Four times a day (QID) | INTRAMUSCULAR | Status: DC | PRN
  Administered 2020-12-20: 4 mg via INTRAVENOUS
  Filled 2020-12-16: qty 2

## 2020-12-16 NOTE — ED Notes (Signed)
Report off to christina rn

## 2020-12-16 NOTE — Consult Note (Signed)
Pharmacy Antibiotic Note  Lonnie [name] Lonnie [name]. is a 87 y.o. male admitted on 12/16/2020 with intra-abdominal infection.  Pharmacy has been consulted for Unasyn dosing. Pt was discharged on augmentin 3/14 and returned due to feeling weak, abdomen is distended and complains of productive cough. Abscess culture growing RARE ENTEROCOCCUS FAECIUM.   Plan: Day 5 of total abx from previous admission. Will start Unasyn 3 g q12H due to renal function.   Height: 5\' 9"  (175.3 cm) Weight: 83 kg (183 lb) IBW/kg (Calculated) : 70.7  Temp (24hrs), Avg:98.5 F (36.9 C), Min:98.5 F (36.9 C), Max:98.5 F (36.9 C)  Recent Labs  Lab 12/10/20 0613 12/11/20 0421 12/12/20 0504 12/13/20 0422 12/14/20 0735 12/16/20 1119 12/16/20 1535  WBC 11.2* 11.1* 8.4  --   --  16.7*  --   CREATININE 1.48* 1.65* 1.99* 1.75* 1.64* 2.51*  --   LATICACIDVEN 0.8  --   --   --   --   --  1.3    Estimated Creatinine Clearance: 18.8 mL/min (A) (by C-G formula based on SCr of 2.51 mg/dL (H)).    Allergies  Allergen Reactions  . Sulfa Antibiotics Other (See Comments)    Not sure of reaction. It was too long ago, but he said not to give it to him.   . Sulfasalazine     Other reaction(s): Other (See Comments) Not sure of reaction. It was too long ago, but he said not to give it to him.   Marland Kitchen Penicillin G Rash    Tolerates ampicillin and amoxicillin    Antimicrobials this admission: 3/8 ceftriaxone + flagyl >> 3/11 3/11 linezolid >> 3/13  3/12 Unasyn >> 3/13  3/14 Augmentin x 1   Dose adjustments this admission: None  Microbiology results: 3/16 BCx: pending 3/9 Abscess cx: RARE ENTEROCOCCUS FAECIUM (pan-sensitive)   3/8 MRSA PCR: negative  Thank you for allowing pharmacy to be a part of this patient's care.  Oswald Hillock, PharmD, BCPS 12/16/2020 4:45 PM

## 2020-12-16 NOTE — H&P (Signed)
History and Physical    Lonnie [name]. OVZ:[id number] DOB: 10/11/1926 DOA: 12/16/2020  PCP: Derinda Late, MD   Patient coming from: Home  I have personally briefly reviewed patient's old medical records in [place]  Chief Complaint: Weakness  Most of the history was obtained from his daughter Lonnie [name] over the phone  HPI: Lonnie [name]. is a 87 y.o. male with medical history significant for acute cholecystitis status post percutaneous cholecystostomy, hypertension, gout, basal cell carcinoma status post skin excision on 12/01/20 who presents to the emergency room via EMS for evaluation of weakness.  Patient's daughter states that he has been weak following his last hospitalization but since his discharge his weakness has worsened.  He also complains of pain in his right upper quadrant and his abdomen appeared more distended.  She states that he has not had a fever and per EMS he had a temp of 99.39F in the field.  He has not had any nausea or vomiting but oral intake is poor.  His daughter was concerned cholecystostomy tube was not draining and called EMS. In the ER patient had difficulty voiding and had an in and out cath done, he had 700 cc of urine in his bladder and required insertion of a Foley catheter. He denies having any chest pain, no shortness of breath, no cough, no palpitations, no diaphoresis, no headache, no blurred vision, no focal deficits, no dizziness or lightheadedness. Labs show sodium 138, potassium 4.1, chloride 113, bicarb 15, glucose 170, BUN 56, creatinine 2.51, calcium 8.4, alkaline phosphatase 62, albumin 3.1, lipase 89, AST 26, ALT 22, total protein 6.3, lactic acid 1.3, white count 16.7, hemoglobin 10.1, hematocrit 30.9, MCV 100.7, RDW 13.7, platelet count 210 CT scan of abdomen and pelvis done without contrast showed no evidence of bowel obstruction. Cholecystostomy tube in place with a 3 mm stone in the decompressed gallbladder. No fluid collection in  the gallbladder fossa. Decreased nodular left lower lobe pulmonary opacity favoring resolving pneumonia. Aortic Atherosclerosis. Twelve-lead EKG reviewed by me shows sinus rhythm with a left bundle branch block.   ED Course: Patient is a 87 year old male who was brought into the ER by EMS for evaluation of weakness and abdominal pain.  Patient was recently discharged from hospital following treatment for acute cholecystitis.  He is status post cholecystostomy tube placement and culture yielded Enterococcus faecium.  Patient was discharged home on Augmentin.  Labs show worsening leukocytosis with a left shift and normal lactic acid level.  Imaging shows gas /fluid-filled distended stomach.  He will be admitted to the hospital for further evaluation.   Review of Systems: As per HPI otherwise all other systems reviewed and negative.    Past Medical History:  Diagnosis Date  . Anemia   . Basal cell carcinoma 11/13/2009   Right alar groove  . Basal cell carcinoma 04/28/2010   left preauricular, BCC with focal sclerosis. Exc. 06/04/2010  . Basal cell carcinoma 04/24/2013   right post auricular  . Basal cell carcinoma 10/26/2017   right inferior cheek above mandible, Superficial  . Basal cell carcinoma 03/22/2018   right post. base of neck  . Basal cell carcinoma 05/08/2018   post. neck, spinal, Superficial. EDC 05/08/2018  . Basal cell carcinoma 07/23/2018   left mid supraclavicular, Superficial and nodular pattern. EDC  . Basal cell carcinoma 07/23/2018   right superior chest parasternal. Superficial and nodular pattern. EDC  . Basal cell carcinoma 05/16/2019   left lateral neck. Superficial  and nodular pattern. EDC  . Basal cell carcinoma 11/07/2019   left lateral forehead. Nodular. EDC  . Basal cell carcinoma 12/23/2019   Left ala nasi. BCC with sclerosis.  . Basal cell carcinoma 11/19/2020   L ear post aspect,excised 12/01/20  . Bundle branch block, left   . CKD (chronic kidney  disease) stage 3, GFR 30-59 ml/min (HCC)   . COPD (chronic obstructive pulmonary disease) ([place])   . Coronary artery disease   . Family history of adverse reaction to anesthesia    DAUGHTER-N/V  . GERD (gastroesophageal reflux disease)   . History of asbestosis   . HLD (hyperlipidemia)   . Hypertension   . Pneumonia 2017  . PSA elevation     Past Surgical History:  Procedure Laterality Date  . APPENDECTOMY    . COLONOSCOPY WITH ESOPHAGOGASTRODUODENOSCOPY (EGD)    . CYSTOSCOPY WITH INSERTION OF UROLIFT N/A 10/06/2020   Procedure: CYSTOSCOPY WITH INSERTION OF UROLIFT;  Surgeon: Abbie Sons, MD;  Location: [facility] ORS;  Service: Urology;  Laterality: N/A;  . RECTAL SURGERY     x3 ABSCESS     reports that he quit smoking about 31 years ago. His smoking use included cigarettes. He has a 100.00 pack-year smoking history. He has never used smokeless tobacco. He reports current alcohol use. He reports that he does not use drugs.  Allergies  Allergen Reactions  . Sulfa Antibiotics Other (See Comments)    Not sure of reaction. It was too long ago, but he said not to give it to him.   . Sulfasalazine     Other reaction(s): Other (See Comments) Not sure of reaction. It was too long ago, but he said not to give it to him.   Marland Kitchen Penicillin G Rash    Tolerates ampicillin and amoxicillin    Family History  Problem Relation Age of Onset  . Heart attack Mother   . Heart attack Father       Prior to Admission medications   Medication Sig Start Date End Date Taking? Authorizing Provider  alfuzosin (UROXATRAL) 10 MG 24 hr tablet Take 10 mg by mouth daily with breakfast.    [provider]  allopurinol (ZYLOPRIM) 300 MG tablet Take 300 mg by mouth every morning.    [provider]  amoxicillin-clavulanate (AUGMENTIN) 500-125 MG tablet Take 1 tablet (500 mg total) by mouth 2 (two) times daily. 12/14/20   Geradine Girt, DO  aspirin 81 MG tablet Take 81 mg by mouth daily.     [provider]  atorvastatin (LIPITOR) 40 MG tablet Take 40 mg by mouth every morning.    [provider]  bisacodyl (DULCOLAX) 5 MG EC tablet Take 10 mg by mouth 2 (two) times daily as needed. 09/18/20   [provider]  budesonide-formoterol (SYMBICORT) 160-4.5 MCG/ACT inhaler Inhale 2 puffs into the lungs 2 (two) times daily.  01/31/18   [provider]  ferrous sulfate 325 (65 FE) MG tablet Take 325 mg by mouth 2 (two) times daily with a meal.     [provider]  finasteride (PROSCAR) 5 MG tablet Take 5 mg by mouth every morning.    [provider]  gabapentin (NEURONTIN) 300 MG capsule Take 600 mg by mouth 2 (two) times daily.     [provider]  Ipratropium-Albuterol (COMBIVENT RESPIMAT) 20-100 MCG/ACT AERS respimat Inhale 1 puff into the lungs every morning.    [provider]  Magnesium Oxide 250 MG TABS  Take 500 mg by mouth every morning.    [provider]  Melatonin 5 MG TABS Take 5 mg by mouth at bedtime.     [provider]  NIFEdipine (PROCARDIA-XL/NIFEDICAL-XL) 30 MG 24 hr tablet Take 60 mg by mouth every morning.    [provider]  nystatin (MYCOSTATIN/NYSTOP) powder Apply topically 3 (three) times daily. 12/14/20   Geradine Girt, DO  Omega-3 1000 MG CAPS Take 1,000 mg by mouth daily.    [provider]  omeprazole (PRILOSEC) 20 MG capsule Take 20 mg by mouth 2 (two) times daily.    [provider]  sennosides-docusate sodium (SENOKOT-S) 8.6-50 MG tablet Take 2 tablets by mouth daily.    [provider]  vitamin B-12 (CYANOCOBALAMIN) 1000 MCG tablet Take 1,000 mcg by mouth daily.    [provider]  vitamin C (ASCORBIC ACID) 250 MG tablet Take 250 mg by mouth 2 (two) times daily.    [provider]    Physical Exam: Vitals:   12/16/20 1611 12/16/20 1615 12/16/20 1630 12/16/20 1645  BP:      Pulse:  100 99 99  Resp:  20 19 18   Temp:  98.5 F (36.9 C)     TempSrc: Oral     SpO2:  96% 96% 94%  Weight:      Height:         Vitals:   12/16/20 1611 12/16/20 1615 12/16/20 1630 12/16/20 1645  BP:      Pulse:  100 99 99  Resp:  20 19 18   Temp: 98.5 F (36.9 C)     TempSrc: Oral     SpO2:  96% 96% 94%  Weight:      Height:          Constitutional: Alert and oriented x 2 .  Appears comfortable and not in any apparent distress HEENT:      Head: Normocephalic and atraumatic.         Eyes: PERLA, EOMI, Conjunctivae pallor. Sclera is non-icteric.       Mouth/Throat: Mucous membranes are dry.       Neck: Supple with no signs of meningismus. Cardiovascular: Regular rate and rhythm. No murmurs, gallops, or rubs. 2+ symmetrical distal pulses are present . No JVD. No LE edema Respiratory: Respiratory effort normal .Lungs sounds clear bilaterally. No wheezes, crackles, or rhonchi.  Gastrointestinal: Soft, right upper quadrant tenderness, and distended with positive bowel sounds.  Genitourinary: No CVA tenderness.  Foley catheter in place Musculoskeletal: Nontender with normal range of motion in all extremities. No cyanosis, or erythema of extremities. Neurologic:  Face is symmetric. Moving all extremities. No gross focal neurologic deficits  Skin: Skin is warm, dry.  No rash or ulcers Psychiatric: Mood and affect are normal   Labs on Admission: I have personally reviewed following labs and imaging studies  CBC: Recent Labs  Lab 12/10/20 0613 12/11/20 0421 12/12/20 0504 12/16/20 1119  WBC 11.2* 11.1* 8.4 16.7*  HGB 9.9* 10.7* 10.2* 10.1*  HCT 30.1* 32.1* 31.2* 30.9*  MCV 99.3 98.2 99.0 100.7*  PLT 141* 166 171 096   Basic Metabolic Panel: Recent Labs  Lab 12/11/20 0421 12/12/20 0504 12/13/20 0422 12/14/20 0735 12/16/20 1119  NA 138 136 138 140 138  K 3.8 3.7 3.8 4.2 4.1  CL 109 109 112* 114* 113*  CO2 21* 20* 20* 20* 15*  GLUCOSE 93 113* 109* 100* 170*  BUN 29* 38* 45* 40* 56*  CREATININE 1.65*  1.99* 1.75* 1.64* 2.51*  CALCIUM 8.9 8.7* 8.8* 8.6* 8.4*   GFR: Estimated Creatinine Clearance: 18.8 mL/min (A) (by C-G formula based on SCr of 2.51 mg/dL (H)). Liver Function Tests: Recent Labs  Lab 12/16/20 1119  AST 26  ALT 22  ALKPHOS 62  BILITOT 0.6  PROT 6.3*  ALBUMIN 3.1*   Recent Labs  Lab 12/16/20 1119  LIPASE 89*   No results for input(s): AMMONIA in the last 168 hours. Coagulation Profile: No results for input(s): INR, PROTIME in the last 168 hours. Cardiac Enzymes: No results for input(s): CKTOTAL, CKMB, CKMBINDEX, TROPONINI in the last 168 hours. BNP (last 3 results) No results for input(s): PROBNP in the last 8760 hours. HbA1C: No results for input(s): HGBA1C in the last 72 hours. CBG: No results for input(s): GLUCAP in the last 168 hours. Lipid Profile: No results for input(s): CHOL, HDL, LDLCALC, TRIG, CHOLHDL, LDLDIRECT in the last 72 hours. Thyroid Function Tests: No results for input(s): TSH, T4TOTAL, FREET4, T3FREE, THYROIDAB in the last 72 hours. Anemia Panel: No results for input(s): VITAMINB12, FOLATE, FERRITIN, TIBC, IRON, RETICCTPCT in the last 72 hours. Urine analysis:    Component Value Date/Time   COLORURINE YELLOW 12/16/2020 1535   APPEARANCEUR CLEAR 12/16/2020 1535   APPEARANCEUR Hazy (A) 09/28/2020 1313   LABSPEC 1.020 12/16/2020 1535   LABSPEC 1.017 09/29/2012 2058   PHURINE 5.0 12/16/2020 1535   GLUCOSEU NEGATIVE 12/16/2020 1535   GLUCOSEU Negative 09/29/2012 2058   HGBUR NEGATIVE 12/16/2020 1535   BILIRUBINUR NEGATIVE 12/16/2020 1535   BILIRUBINUR Negative 09/28/2020 1313   BILIRUBINUR Negative 09/29/2012 2058   KETONESUR TRACE (A) 12/16/2020 1535   PROTEINUR 30 (A) 12/16/2020 1535   NITRITE NEGATIVE 12/16/2020 1535   LEUKOCYTESUR TRACE (A) 12/16/2020 1535   LEUKOCYTESUR Negative 09/29/2012 2058    Radiological Exams on Admission: CT ABDOMEN PELVIS WO CONTRAST  Result Date: 12/16/2020 CLINICAL DATA:  Bowel obstruction  suspected. Increasing weakness. Recent acute cholecystitis treated with cholecystostomy tube placement. EXAM: CT ABDOMEN AND PELVIS WITHOUT CONTRAST TECHNIQUE: Multidetector CT imaging of the abdomen and pelvis was performed following the standard protocol without IV contrast. COMPARISON:  12/08/2020 FINDINGS: Lower chest: Partially visualized dependent opacities in the right greater than left lower lobes suggesting atelectasis and scarring with asymmetric bronchiectasis on the right. Decreased size of nodular left lower lobe opacity favoring resolving infection. Calcified pleural plaques bilaterally. No pleural effusion. Coronary atherosclerosis. Hepatobiliary: No focal liver abnormality is seen. A cholecystostomy tube is in place. There is a 3 mm stone within the collapsed gallbladder. No fluid collection is present in the gallbladder fossa. There is no biliary dilatation. Pancreas: Unremarkable. Spleen: Unremarkable. Adrenals/Urinary Tract: Unremarkable adrenal glands. Bilateral renal vascular calcifications. Bilateral renal scarring. No hydronephrosis. Moderately distended and otherwise unremarkable bladder. Stomach/Bowel: The stomach is moderately distended by gas and fluid. There is no evidence of bowel obstruction or inflammation. A moderate amount of stool is noted in the colon and rectum. There is diverticulosis of the sigmoid colon without evidence of acute diverticulitis. Prior appendectomy. Vascular/Lymphatic: Abdominal aortic atherosclerosis without aneurysm. No enlarged lymph nodes. Reproductive: Status post urolift procedure with similar appearance of mild thickening of the adjacent right-sided pelvic musculature. Other: No intraperitoneal free fluid. Musculoskeletal: No acute osseous abnormality or suspicious osseous lesion. Mild lumbar levoscoliosis. Mild disc and advanced facet degeneration in the lumbar spine. Similar appearance of mixed sclerosis and lucency in the femoral heads without collapse,  query AVN. IMPRESSION: 1. No evidence of bowel obstruction. 2. Cholecystostomy  tube in place with a 3 mm stone in the decompressed gallbladder. No fluid collection in the gallbladder fossa. 3. Decreased nodular left lower lobe pulmonary opacity favoring resolving pneumonia. 4. Aortic Atherosclerosis (ICD10-I70.0). Electronically Signed   By: Logan Bores M.D.   On: 12/16/2020 13:56   DG Chest 2 View  Result Date: 12/16/2020 CLINICAL DATA:  87 year old male with history of cough and shortness of breath. EXAM: CHEST - 2 VIEW COMPARISON:  Chest x-ray 12/11/2020. FINDINGS: Calcified pleural plaques are again noted in the thorax bilaterally indicative of asbestos related pleural disease. Lung volumes are low. Chronic elevation of the left hemidiaphragm, similar to prior studies. No consolidative airspace disease. No pleural effusions. No pneumothorax. No pulmonary nodule or mass noted. Pulmonary vasculature and the cardiomediastinal silhouette are within normal limits. Atherosclerotic calcifications in the thoracic aorta. IMPRESSION: 1. Low lung volumes without radiographic evidence of acute cardiopulmonary disease. 2. Aortic atherosclerosis. 3. Asbestos related pleural disease redemonstrated. Electronically Signed   By: Vinnie Langton M.D.   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 BUN is also  elevated at 56 Worsening renal function may be secondary to dehydration from poor oral intake as well as postobstructive from bladder outlet obstruction We will hydrate patient and repeat renal parameters in a.m.    BPH with urinary retention Patient has a history of BPH and noted to have urinary retention in the ER with about 700 cc residual volume Foley catheter was placed in the ER Patient will need voiding trial prior to discharge Continue Uroxatral and finasteride   COPD Stable and not acutely exacerbated Continue inhaled steroids and as needed bronchodilator therapy   Acute cholecystitis Patient was recently hospitalized for acute cholecystitis and is status post percutaneous cholecystostomy He presents for evaluation of abdominal pain mostly in the right upper quadrant and now has worsening leukocytosis (white count was normal upon discharge) Culture yielded Enterococcus faecium We will place patient on IV Unasyn    GERD Continue Protonix   DVT prophylaxis: Lovenox Code Status: full code Family Communication: Greater than 50% of time was spent discussing patient's condition and plan of care with his daughter, Ms Lonnie [name] over the phone.  All questions and concerns have been addressed.  CODE STATUS was discussed and patient is a full code. Disposition Plan: Back to previous home environment Consults called: General surgery Status: At the time of admission, it appears that the appropriate admission status for this patient is inpatient.  This is judged to be reasonable and necessary in order to provide the required intensity of service to ensure the patient's safety given the presenting symptoms, physical exam findings and initial radiographic and laboratory data in the context of their comorbid conditions. Patient requires inpatient status due to high intensity of service, high risk for further deterioration and high frequency of surveillance required.    Collier Bullock MD [facility]     12/16/2020, 5:27 PM

## 2020-12-16 NOTE — ED Notes (Signed)
16 fr foley cath inserted by Su Grand.  Pt tolerated well.  In and out cath done first with 700cc urine in bag.

## 2020-12-16 NOTE — ED Provider Notes (Signed)
[facility] Emergency Department Provider Note    Event Date/Time   First MD Initiated Contact with Patient 12/16/20 1141     (approximate)  I have reviewed the triage vital signs and the nursing notes.   HISTORY  Chief Complaint Weakness    HPI Lonnie [name]. is a 87 y.o. male with recent admission to hospital for cholecystitis status post cholecystostomy tube drain in place presents to the ER now with generalized weakness decreased p.o. intake.  Denies any pain.  Does feel his abdomen is distended and also complaining of productive cough.    Past Medical History:  Diagnosis Date  . Anemia   . Basal cell carcinoma 11/13/2009   Right alar groove  . Basal cell carcinoma 04/28/2010   left preauricular, BCC with focal sclerosis. Exc. 06/04/2010  . Basal cell carcinoma 04/24/2013   right post auricular  . Basal cell carcinoma 10/26/2017   right inferior cheek above mandible, Superficial  . Basal cell carcinoma 03/22/2018   right post. base of neck  . Basal cell carcinoma 05/08/2018   post. neck, spinal, Superficial. EDC 05/08/2018  . Basal cell carcinoma 07/23/2018   left mid supraclavicular, Superficial and nodular pattern. EDC  . Basal cell carcinoma 07/23/2018   right superior chest parasternal. Superficial and nodular pattern. EDC  . Basal cell carcinoma 05/16/2019   left lateral neck. Superficial and nodular pattern. EDC  . Basal cell carcinoma 11/07/2019   left lateral forehead. Nodular. EDC  . Basal cell carcinoma 12/23/2019   Left ala nasi. BCC with sclerosis.  . Basal cell carcinoma 11/19/2020   L ear post aspect,excised 12/01/20  . Bundle branch block, left   . CKD (chronic kidney disease) stage 3, GFR 30-59 ml/min (HCC)   . COPD (chronic obstructive pulmonary disease) ([place])   . Coronary artery disease   . Family history of adverse reaction to anesthesia    DAUGHTER-N/V  . GERD (gastroesophageal reflux disease)   . History of  asbestosis   . HLD (hyperlipidemia)   . Hypertension   . Pneumonia 2017  . PSA elevation    Family History  Problem Relation Age of Onset  . Heart attack Mother   . Heart attack Father    Past Surgical History:  Procedure Laterality Date  . APPENDECTOMY    . COLONOSCOPY WITH ESOPHAGOGASTRODUODENOSCOPY (EGD)    . CYSTOSCOPY WITH INSERTION OF UROLIFT N/A 10/06/2020   Procedure: CYSTOSCOPY WITH INSERTION OF UROLIFT;  Surgeon: Abbie Sons, MD;  Location: [facility] ORS;  Service: Urology;  Laterality: N/A;  . RECTAL SURGERY     x3 ABSCESS   Patient Active Problem List   Diagnosis Date Noted  . Acute cholecystitis 12/09/2020  . Cholecystitis 12/08/2020  . Basal cell carcinoma (BCC) of left ear 12/08/2020  . Hypotonicity of bladder 11/29/2018  . Acute lower UTI 08/11/2018  . UTI (urinary tract infection) 08/11/2018  . Incomplete bladder emptying 08/09/2017  . 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 Stage 3 chronic kidney disease ([place]) 08/09/2017  . Pneumonia 01/10/2016  . Nocturia 01/28/2015  . Bundle branch block, left 06/11/2014  . Dizziness 06/11/2014  . Dyspnea 06/11/2014  . History of vertigo 06/11/2014  . Hx of asbestosis 06/11/2014  . Hyperlipidemia 06/11/2014  . Hypertension 06/11/2014  . Chronic obstructive pulmonary disease, unspecified ([place]) 03/06/2014  . Elevated prostate specific antigen (PSA) 05/13/2013      Prior to Admission medications   Medication Sig Start Date End  Date Taking? Authorizing Provider  alfuzosin (UROXATRAL) 10 MG 24 hr tablet Take 10 mg by mouth daily with breakfast.    [provider]  allopurinol (ZYLOPRIM) 300 MG tablet Take 300 mg by mouth every morning.    [provider]  amoxicillin-clavulanate (AUGMENTIN) 500-125 MG tablet Take 1 tablet (500 mg total) by mouth 2 (two) times daily. 12/14/20   Geradine Girt, DO  aspirin 81 MG tablet Take 81 mg by mouth daily.    [provider]   atorvastatin (LIPITOR) 40 MG tablet Take 40 mg by mouth every morning.    [provider]  bisacodyl (DULCOLAX) 5 MG EC tablet Take 10 mg by mouth 2 (two) times daily as needed. 09/18/20   [provider]  budesonide-formoterol (SYMBICORT) 160-4.5 MCG/ACT inhaler Inhale 2 puffs into the lungs 2 (two) times daily.  01/31/18   [provider]  ferrous sulfate 325 (65 FE) MG tablet Take 325 mg by mouth 2 (two) times daily with a meal.     [provider]  finasteride (PROSCAR) 5 MG tablet Take 5 mg by mouth every morning.    [provider]  gabapentin (NEURONTIN) 300 MG capsule Take 600 mg by mouth 2 (two) times daily.     [provider]  Ipratropium-Albuterol (COMBIVENT RESPIMAT) 20-100 MCG/ACT AERS respimat Inhale 1 puff into the lungs every morning.    [provider]  Magnesium Oxide 250 MG TABS Take 500 mg by mouth every morning.    [provider]  Melatonin 5 MG TABS Take 5 mg by mouth at bedtime.     [provider]  NIFEdipine (PROCARDIA-XL/NIFEDICAL-XL) 30 MG 24 hr tablet Take 60 mg by mouth every morning.    [provider]  nystatin (MYCOSTATIN/NYSTOP) powder Apply topically 3 (three) times daily. 12/14/20   Geradine Girt, DO  Omega-3 1000 MG CAPS Take 1,000 mg by mouth daily.    [provider]  omeprazole (PRILOSEC) 20 MG capsule Take 20 mg by mouth 2 (two) times daily.    [provider]  sennosides-docusate sodium (SENOKOT-S) 8.6-50 MG tablet Take 2 tablets by mouth daily.    [provider]  vitamin B-12 (CYANOCOBALAMIN) 1000 MCG tablet Take 1,000 mcg by mouth daily.    [provider]  vitamin C (ASCORBIC ACID) 250 MG tablet Take 250 mg by mouth 2 (two) times daily.    [provider]    Allergies Sulfa antibiotics, Sulfasalazine, and Penicillin g    Social History Social History   Tobacco Use  . Smoking status: Former Smoker     Packs/day: 2.00    Years: 50.00    Pack years: 100.00    Types: Cigarettes    Quit date: 09/24/1989    Years since quitting: 31.2  . Smokeless tobacco: Never Used  Vaping Use  . Vaping Use: Never used  Substance Use Topics  . Alcohol use: Yes    Alcohol/week: 0.0 standard drinks    Comment: OCC RUM AND COKE  . Drug use: No    Review of Systems Patient denies headaches, rhinorrhea, blurry vision, numbness, shortness of breath, chest pain, edema, cough, abdominal pain, nausea, vomiting, diarrhea, dysuria, fevers, rashes or hallucinations unless otherwise stated above in HPI. ____________________________________________   PHYSICAL EXAM:  VITAL SIGNS: Vitals:   12/16/20 1200 12/16/20 1500  BP: (!) 115/50 (!) 126/58  Pulse: 89 96  Resp: 17 18  Temp:    SpO2: 95% 96%  Constitutional: Alert and oriented.  Eyes: Conjunctivae are normal.  Head: Atraumatic. Nose: No congestion/rhinnorhea. Mouth/Throat: Mucous membranes are moist.   Neck: No stridor. Painless ROM.  Cardiovascular: Normal rate, regular rhythm. Grossly normal heart sounds.  Good peripheral circulation. Respiratory: Normal respiratory effort.  No retractions. Lungs CTAB. Gastrointestinal: Right cholecystostomy drain in place clean dry and intact.  Does have tympanic to percussion distended abdomen not tender however.  No distention. No abdominal bruits. No CVA tenderness. Genitourinary:  Musculoskeletal: No lower extremity tenderness nor edema.  No joint effusions. Neurologic:  Normal speech and language. No gross focal neurologic deficits are appreciated. No facial droop Skin:  Skin is warm, dry and intact. No rash noted. Psychiatric: Mood and affect are normal. Speech and behavior are normal.  ____________________________________________   LABS (all labs ordered are listed, but only abnormal results are displayed)  Results for orders placed or performed during the hospital encounter of 12/16/20 (from the  past 24 hour(s))  Basic metabolic panel     Status: Abnormal   Collection Time: 12/16/20 11:19 AM  Result Value Ref Range   Sodium 138 135 - 145 mmol/L   Potassium 4.1 3.5 - 5.1 mmol/L   Chloride 113 (H) 98 - 111 mmol/L   CO2 15 (L) 22 - 32 mmol/L   Glucose, Bld 170 (H) 70 - 99 mg/dL   BUN 56 (H) 8 - 23 mg/dL   Creatinine, Ser 2.51 (H) 0.61 - 1.24 mg/dL   Calcium 8.4 (L) 8.9 - 10.3 mg/dL   GFR, Estimated 23 (L) >60 mL/min   Anion gap 10 5 - 15  CBC     Status: Abnormal   Collection Time: 12/16/20 11:19 AM  Result Value Ref Range   WBC 16.7 (H) 4.0 - 10.5 K/uL   RBC 3.07 (L) 4.22 - 5.81 MIL/uL   Hemoglobin 10.1 (L) 13.0 - 17.0 g/dL   HCT 30.9 (L) 39.0 - 52.0 %   MCV 100.7 (H) 80.0 - 100.0 fL   MCH 32.9 26.0 - 34.0 pg   MCHC 32.7 30.0 - 36.0 g/dL   RDW 13.7 11.5 - 15.5 %   Platelets 210 150 - 400 K/uL   nRBC 0.0 0.0 - 0.2 %  Hepatic function panel     Status: Abnormal   Collection Time: 12/16/20 11:19 AM  Result Value Ref Range   Total Protein 6.3 (L) 6.5 - 8.1 g/dL   Albumin 3.1 (L) 3.5 - 5.0 g/dL   AST 26 15 - 41 U/L   ALT 22 0 - 44 U/L   Alkaline Phosphatase 62 38 - 126 U/L   Total Bilirubin 0.6 0.3 - 1.2 mg/dL   Bilirubin, Direct 0.1 0.0 - 0.2 mg/dL   Indirect Bilirubin 0.5 0.3 - 0.9 mg/dL  Lipase, blood     Status: Abnormal   Collection Time: 12/16/20 11:19 AM  Result Value Ref Range   Lipase 89 (H) 11 - 51 U/L   ____________________________________________  EKG My review and personal interpretation at Time: 11:18   Indication: weakness  Rate: 95  Rhythm: sinus Axis: normal Other: normal intervals, no stemi ____________________________________________  RADIOLOGY  I personally reviewed all radiographic images ordered to evaluate for the above acute complaints and reviewed radiology reports and findings.  These findings were personally discussed with the patient.  Please see medical record for radiology  report.  ____________________________________________   PROCEDURES  Procedure(s) performed:  Procedures    Critical Care performed: no ____________________________________________   INITIAL IMPRESSION / ASSESSMENT  AND PLAN / ED COURSE  Pertinent labs & imaging results that were available during my care of the patient were reviewed by me and considered in my medical decision making (see chart for details).   DDX: Cholecystitis, abscess, dehydration, electrolyte abnormality, anemia, pneumonia, cystitis, bacteremia  Lonnie [name]. is a 87 y.o. who presents to the ED with presentation as described above.  Patient frail ill-appearing ducting his airway.  Does appear dehydrated will give IV fluids.  Belly does not feel distended therefore CT imaging ordered.  Does have evidence of AKI metabolic acidosis.  Mild like leukocytosis as well.  I have ordered IV antibiotics.  CT imaging ordered but had to be done with out IV contrast due to renal insufficiency.  IV fluids given.  CT imaging without evidence of acute intra-abdominal process.  Lipase is mildly elevated but T bili normal.  Will discuss with hospitalist for admission.  I discussed case with Dr. About who requested reconsult once urinalysis and lactate is resulted.  Patient signed out to oncoming physician to follow-up on those labs.     The patient was evaluated in Emergency Department today for the symptoms described in the history of present illness. He/she was evaluated in the context of the global COVID-19 pandemic, which necessitated consideration that the patient might be at risk for infection with the SARS-CoV-2 virus that causes COVID-19. Institutional protocols and algorithms that pertain to the evaluation of patients at risk for COVID-19 are in a state of rapid change based on information released by regulatory bodies including the CDC and federal and state organizations. These policies and algorithms were followed during the  patient's care in the ED.  As part of my medical decision making, I reviewed the following data within the [facility] [place] notes reviewed and incorporated, Labs reviewed, notes from prior ED visits and [place] Controlled Substance Database   ____________________________________________   FINAL CLINICAL IMPRESSION(S) / ED DIAGNOSES  Final diagnoses:  Generalized weakness  AKI (acute kidney injury) ([place])      NEW MEDICATIONS STARTED DURING THIS VISIT:  New Prescriptions   No medications on file     Note:  This document was prepared using Dragon voice recognition software and may include unintentional dictation errors.    Merlyn Lot, MD 12/16/20 [phone]

## 2020-12-16 NOTE — ED Notes (Signed)
In and out cath done, 700cc urine.  md aware. Foley now placed.  Pt had stool all over, pt cleaned.  Iv meds infusing.  Pt alert.  nsr on monitor  family with pt.

## 2020-12-16 NOTE — Progress Notes (Signed)
Patient ID: Lonnie Sermons., male   DOB: Sep 22, 1927, 87 y.o.   MRN: [id number]       SURGICAL CONSULTATION NOTE   HISTORY OF PRESENT ILLNESS (HPI):  87 y.o. male presented to [facility] ED for evaluation of generalized weakness and decrease appetite.  Patient's daughter at bedside who gave history. Patient was recently discharged from [facility] due to cholecystitis and pneumonia. Patient was doing well at discharge but the daughter reports that he has not been feeling well since yesterday. There has been significant deterioration with weakness, appetite, abdominal pain and difficulty urinating.   At the ED he was found with leukocytosis. Due to abdominal pain and increased WBC count CT scan of the abdomen was done. There was no significant intra abdominal pathology identified. Percutaneous cholecystostomy drain in place, gallbladder decompressed. Drain is working adequately with a significant amount. Due to the difficulty urinating, he was catheterarized and 700 mL of urine drained. No significant UTI seen on urinalysis. Chest xray also non diagnostic for pneumonia. Blood cultures in process. I personally evaluated the CT scan of the abdomen and chest xray.   Surgery is consulted by Dr. Francine Graven in this context for evaluation and management of abdominal pain and gastric distention on CT scan. Marland Kitchen  PAST MEDICAL HISTORY (PMH):  Past Medical History:  Diagnosis Date  . Anemia   . Basal cell carcinoma 11/13/2009   Right alar groove  . Basal cell carcinoma 04/28/2010   left preauricular, BCC with focal sclerosis. Exc. 06/04/2010  . Basal cell carcinoma 04/24/2013   right post auricular  . Basal cell carcinoma 10/26/2017   right inferior cheek above mandible, Superficial  . Basal cell carcinoma 03/22/2018   right post. base of neck  . Basal cell carcinoma 05/08/2018   post. neck, spinal, Superficial. EDC 05/08/2018  . Basal cell carcinoma 07/23/2018   left mid supraclavicular, Superficial and nodular pattern. EDC   . Basal cell carcinoma 07/23/2018   right superior chest parasternal. Superficial and nodular pattern. EDC  . Basal cell carcinoma 05/16/2019   left lateral neck. Superficial and nodular pattern. EDC  . Basal cell carcinoma 11/07/2019   left lateral forehead. Nodular. EDC  . Basal cell carcinoma 12/23/2019   Left ala nasi. BCC with sclerosis.  . Basal cell carcinoma 11/19/2020   L ear post aspect,excised 12/01/20  . Bundle branch block, left   . CKD (chronic kidney disease) stage 3, GFR 30-59 ml/min (HCC)   . COPD (chronic obstructive pulmonary disease) ([place])   . Coronary artery disease   . Family history of adverse reaction to anesthesia    DAUGHTER-N/V  . GERD (gastroesophageal reflux disease)   . History of asbestosis   . HLD (hyperlipidemia)   . Hypertension   . Pneumonia 2017  . PSA elevation      PAST SURGICAL HISTORY ([place]):  Past Surgical History:  Procedure Laterality Date  . APPENDECTOMY    . COLONOSCOPY WITH ESOPHAGOGASTRODUODENOSCOPY (EGD)    . CYSTOSCOPY WITH INSERTION OF UROLIFT N/A 10/06/2020   Procedure: CYSTOSCOPY WITH INSERTION OF UROLIFT;  Surgeon: Abbie Sons, MD;  Location: [facility] ORS;  Service: Urology;  Laterality: N/A;  . RECTAL SURGERY     x3 ABSCESS     MEDICATIONS:  Prior to Admission medications   Medication Sig Start Date End Date Taking? Authorizing Provider  alfuzosin (UROXATRAL) 10 MG 24 hr tablet Take 10 mg by mouth daily with breakfast.   Yes [provider]  allopurinol (ZYLOPRIM) 300 MG  tablet Take 300 mg by mouth every morning.   Yes [provider]  aspirin 81 MG tablet Take 81 mg by mouth daily.   Yes [provider]  atorvastatin (LIPITOR) 40 MG tablet Take 40 mg by mouth every morning.   Yes [provider]  bisacodyl (DULCOLAX) 5 MG EC tablet Take 10 mg by mouth 2 (two) times daily as needed. 09/18/20  Yes [provider]  budesonide-formoterol (SYMBICORT) 160-4.5 MCG/ACT inhaler Inhale  2 puffs into the lungs 2 (two) times daily.  01/31/18  Yes [provider]  ferrous sulfate 325 (65 FE) MG tablet Take 325 mg by mouth 2 (two) times daily with a meal.    Yes [provider]  finasteride (PROSCAR) 5 MG tablet Take 5 mg by mouth every morning.   Yes [provider]  gabapentin (NEURONTIN) 300 MG capsule Take 600 mg by mouth 2 (two) times daily.    Yes [provider]  Ipratropium-Albuterol (COMBIVENT RESPIMAT) 20-100 MCG/ACT AERS respimat Inhale 1 puff into the lungs every morning.   Yes [provider]  Magnesium Oxide 250 MG TABS Take 500 mg by mouth every morning.   Yes [provider]  Melatonin 5 MG TABS Take 5 mg by mouth at bedtime.    Yes [provider]  NIFEdipine (PROCARDIA-XL/NIFEDICAL-XL) 30 MG 24 hr tablet Take 60 mg by mouth every morning.   Yes [provider]  nystatin (MYCOSTATIN/NYSTOP) powder Apply topically 3 (three) times daily. 12/14/20  Yes Vann, Jessica U, DO  Omega-3 1000 MG CAPS Take 1,000 mg by mouth daily.   Yes [provider]  omeprazole (PRILOSEC) 20 MG capsule Take 20 mg by mouth 2 (two) times daily.   Yes [provider]  sennosides-docusate sodium (SENOKOT-S) 8.6-50 MG tablet Take 2 tablets by mouth daily.   Yes [provider]  vitamin B-12 (CYANOCOBALAMIN) 1000 MCG tablet Take 1,000 mcg by mouth daily.   Yes [provider]  vitamin C (ASCORBIC ACID) 250 MG tablet Take 250 mg by mouth 2 (two) times daily.   Yes [provider]  amoxicillin-clavulanate (AUGMENTIN) 500-125 MG tablet Take 1 tablet (500 mg total) by mouth 2 (two) times daily. 12/14/20   Geradine Girt, DO     ALLERGIES:  Allergies  Allergen Reactions  . Sulfa Antibiotics Other (See Comments)    Not sure of reaction. It was too long ago, but he said not to give it to him.   . Sulfasalazine     Other reaction(s): Other (See Comments) Not sure of reaction. It was too  long ago, but he said not to give it to him.   Marland Kitchen Penicillin G Rash    Tolerates ampicillin and amoxicillin     SOCIAL HISTORY:  Social History   Socioeconomic History  . Marital status: Widowed    Spouse name: Not on file  . Number of children: Not on file  . Years of education: Not on file  . Highest education level: Not on file  Occupational History  . Not on file  Tobacco Use  . Smoking status: Former Smoker    Packs/day: 2.00    Years: 50.00    Pack years: 100.00    Types: Cigarettes    Quit date: 09/24/1989    Years since quitting: 31.2  . Smokeless tobacco: Never Used  Vaping Use  . Vaping Use: Never used  Substance and Sexual Activity  . Alcohol use: Yes  Alcohol/week: 0.0 standard drinks    Comment: OCC RUM AND COKE  . Drug use: No  . Sexual activity: Not Currently  Other Topics Concern  . Not on file  Social History Narrative  . Not on file   Social Determinants of Health   Financial Resource Strain: Not on file  Food Insecurity: Not on file  Transportation Needs: Not on file  Physical Activity: Not on file  Stress: Not on file  Social Connections: Not on file  Intimate Partner Violence: Not on file      FAMILY HISTORY:  Family History  Problem Relation Age of Onset  . Heart attack Mother   . Heart attack Father      REVIEW OF SYSTEMS:  Constitutional: denies weight loss, fever, chills, or sweats. Positive for generalized weakness and malaise  Eyes: denies any other vision changes, history of eye injury  ENT: denies sore throat, hearing problems  Respiratory: denies shortness of breath, wheezing  Cardiovascular: denies chest pain, palpitations  Gastrointestinal: Positive abdominal pain Genitourinary: denies burning with urination or urinary frequency. Positive for retention Musculoskeletal: denies any other joint pains or cramps  Skin: denies any other rashes or skin discolorations  Neurological: denies any other headache,  dizziness Psychiatric: denies any other depression, anxiety   All other review of systems were negative   VITAL SIGNS:  Temp:  [98.5 F (36.9 C)] 98.5 F (36.9 C) (03/16 1611) Pulse Rate:  [88-108] 99 (03/16 1930) Resp:  [15-28] 23 (03/16 1930) BP: (102-131)/(46-93) 102/58 (03/16 1930) SpO2:  [93 %-99 %] 93 % (03/16 1930) Weight:  [83 kg] 83 kg (03/16 1116)     Height: 5\' 9"  (175.3 cm) Weight: 83 kg BMI (Calculated): 27.01   INTAKE/OUTPUT:  This shift: No intake/output data recorded.  Last 2 shifts: @IOLAST2SHIFTS @   PHYSICAL EXAM:  Constitutional:  -- Normal body habitus but weak -- Awake, alert, and oriented x2  Eyes:  -- Pupils equally round and reactive to light  -- No scleral icterus  Ear, nose, and throat:  -- No jugular venous distension  Pulmonary:  -- No crackles  -- Equal breath sounds bilaterally -- Breathing non-labored at rest Cardiovascular:  -- S1, S2 present  -- No pericardial rubs Gastrointestinal:  -- Abdomen soft, nontender, non-distended, no guarding or rebound tenderness -- No abdominal masses appreciated, pulsatile or otherwise  Musculoskeletal and Integumentary:  -- Wounds: None appreciated -- Extremities: B/L UE and LE FROM, hands and feet warm, no edema  Neurologic:  -- Motor function: intact and symmetric -- Sensation: intact and symmetric   Labs:  CBC Latest Ref Rng & Units 12/16/2020 12/12/2020 12/11/2020  WBC 4.0 - 10.5 K/uL 16.7(H) 8.4 11.1(H)  Hemoglobin 13.0 - 17.0 g/dL 10.1(L) 10.2(L) 10.7(L)  Hematocrit 39.0 - 52.0 % 30.9(L) 31.2(L) 32.1(L)  Platelets 150 - 400 K/uL 210 171 166   CMP Latest Ref Rng & Units 12/16/2020 12/14/2020 12/13/2020  Glucose 70 - 99 mg/dL 170(H) 100(H) 109(H)  BUN 8 - 23 mg/dL 56(H) 40(H) 45(H)  Creatinine 0.61 - 1.24 mg/dL 2.51(H) 1.64(H) 1.75(H)  Sodium 135 - 145 mmol/L 138 140 138  Potassium 3.5 - 5.1 mmol/L 4.1 4.2 3.8  Chloride 98 - 111 mmol/L 113(H) 114(H) 112(H)  CO2 22 - 32 mmol/L 15(L) 20(L) 20(L)   Calcium 8.9 - 10.3 mg/dL 8.4(L) 8.6(L) 8.8(L)  Total Protein 6.5 - 8.1 g/dL 6.3(L) - -  Total Bilirubin 0.3 - 1.2 mg/dL 0.6 - -  Alkaline Phos 38 - 126 U/L  62 - -  AST 15 - 41 U/L 26 - -  ALT 0 - 44 U/L 22 - -    Imaging studies:  EXAM: CT ABDOMEN AND PELVIS WITHOUT CONTRAST  TECHNIQUE: Multidetector CT imaging of the abdomen and pelvis was performed following the standard protocol without IV contrast.  COMPARISON:  12/08/2020  FINDINGS: Lower chest: Partially visualized dependent opacities in the right greater than left lower lobes suggesting atelectasis and scarring with asymmetric bronchiectasis on the right. Decreased size of nodular left lower lobe opacity favoring resolving infection. Calcified pleural plaques bilaterally. No pleural effusion. Coronary atherosclerosis.  Hepatobiliary: No focal liver abnormality is seen. A cholecystostomy tube is in place. There is a 3 mm stone within the collapsed gallbladder. No fluid collection is present in the gallbladder fossa. There is no biliary dilatation.  Pancreas: Unremarkable.  Spleen: Unremarkable.  Adrenals/Urinary Tract: Unremarkable adrenal glands. Bilateral renal vascular calcifications. Bilateral renal scarring. No hydronephrosis. Moderately distended and otherwise unremarkable bladder.  Stomach/Bowel: The stomach is moderately distended by gas and fluid. There is no evidence of bowel obstruction or inflammation. A moderate amount of stool is noted in the colon and rectum. There is diverticulosis of the sigmoid colon without evidence of acute diverticulitis. Prior appendectomy.  Vascular/Lymphatic: Abdominal aortic atherosclerosis without aneurysm. No enlarged lymph nodes.  Reproductive: Status post urolift procedure with similar appearance of mild thickening of the adjacent right-sided pelvic musculature.  Other: No intraperitoneal free fluid.  Musculoskeletal: No acute osseous abnormality  or suspicious osseous lesion. Mild lumbar levoscoliosis. Mild disc and advanced facet degeneration in the lumbar spine. Similar appearance of mixed sclerosis and lucency in the femoral heads without collapse, query AVN.  IMPRESSION: 1. No evidence of bowel obstruction. 2. Cholecystostomy tube in place with a 3 mm stone in the decompressed gallbladder. No fluid collection in the gallbladder fossa. 3. Decreased nodular left lower lobe pulmonary opacity favoring resolving pneumonia. 4. Aortic Atherosclerosis (ICD10-I70.0).   Electronically Signed   By: Logan Bores M.D.   On: 12/16/2020 13:56   Assessment/Plan:  87 y.o. male with generalized weakness, acute kidney injury, urinary retention, recent cholecystitis resolved with percutaneous cholecystostomy, complicated by pertinent comorbidities including hypertension, chronic kidney disease stage III, COPD, coronary tree disease, hyperlipidemia.  Patient with unspecified abdominal pain. I have low suspicious of recurrence or non resolved cholecystitis. The cholecystostomy tube is in place and working adequately. There is adequate bile drainage. Gallbladder is well decompressed. There is no bowel obstruction and the gastric distention with gas and fluid mentioned by the Radiologist is non specific. At this moment there is no surgical pathology identified that explain patient's deterioration after discharge.   Patient currently dehydrated. The increase in creatine may be a combination of dehydration and the urinary retention.   Patient has demonstrated that his health status is very delicate and with any minimal insult he deteriorates quickly. Patient not a surgical candidate at this moment.   Agree with current management as per Primary team.   Arnold Long, MD

## 2020-12-16 NOTE — ED Notes (Signed)
Heart rate improved with meds .  Pt sleeping  Heart rate in 90's.  Family with pt.

## 2020-12-16 NOTE — ED Notes (Signed)
Family at bedside. 

## 2020-12-16 NOTE — ED Triage Notes (Addendum)
Pt comes EMS from home with weakness starting last night and getting worse today. Pt was d/c Monday from chole with drain placement. Pt has drain in place. Also had cancerous spot removed from ear with bandage in place. Pt denies any pain but is generally weak. Pt has 20G with 550ml fluids going from EMS. Pt was 99.5 with fire but was afebrile here. Aox4. Pt also has productive cough and some SOB

## 2020-12-17 DIAGNOSIS — R531 Weakness: Secondary | ICD-10-CM

## 2020-12-17 DIAGNOSIS — N179 Acute kidney failure, unspecified: Secondary | ICD-10-CM

## 2020-12-17 DIAGNOSIS — I472 Ventricular tachycardia: Secondary | ICD-10-CM

## 2020-12-17 DIAGNOSIS — D72829 Elevated white blood cell count, unspecified: Secondary | ICD-10-CM

## 2020-12-17 DIAGNOSIS — I10 Essential (primary) hypertension: Secondary | ICD-10-CM

## 2020-12-17 DIAGNOSIS — B370 Candidal stomatitis: Secondary | ICD-10-CM

## 2020-12-17 DIAGNOSIS — K810 Acute cholecystitis: Principal | ICD-10-CM

## 2020-12-17 DIAGNOSIS — R000 Tachycardia, unspecified: Secondary | ICD-10-CM

## 2020-12-17 DIAGNOSIS — N189 Chronic kidney disease, unspecified: Secondary | ICD-10-CM

## 2020-12-17 DIAGNOSIS — N400 Enlarged prostate without lower urinary tract symptoms: Secondary | ICD-10-CM

## 2020-12-17 LAB — BASIC METABOLIC PANEL
Anion gap: 8 (ref 5–15)
BUN: 58 mg/dL — ABNORMAL HIGH (ref 8–23)
CO2: 14 mmol/L — ABNORMAL LOW (ref 22–32)
Calcium: 8 mg/dL — ABNORMAL LOW (ref 8.9–10.3)
Chloride: 116 mmol/L — ABNORMAL HIGH (ref 98–111)
Creatinine, Ser: 2.62 mg/dL — ABNORMAL HIGH (ref 0.61–1.24)
GFR, Estimated: 22 mL/min — ABNORMAL LOW (ref >60)
Glucose, Bld: 99 mg/dL (ref 70–99)
Potassium: 3.9 mmol/L (ref 3.5–5.1)
Sodium: 138 mmol/L (ref 135–145)

## 2020-12-17 LAB — CBC
HCT: 28.2 % — ABNORMAL LOW (ref 39.0–52.0)
Hemoglobin: 9.4 g/dL — ABNORMAL LOW (ref 13.0–17.0)
MCH: 33.6 pg (ref 26.0–34.0)
MCHC: 33.3 g/dL (ref 30.0–36.0)
MCV: 100.7 fL — ABNORMAL HIGH (ref 80.0–100.0)
Platelets: 195 10*3/uL (ref 150–400)
RBC: 2.8 MIL/uL — ABNORMAL LOW (ref 4.22–5.81)
RDW: 13.9 % (ref 11.5–15.5)
WBC: 19 10*3/uL — ABNORMAL HIGH (ref 4.0–10.5)
nRBC: 0 % (ref 0.0–0.2)

## 2020-12-17 LAB — SARS CORONAVIRUS 2 (TAT 6-24 HRS): SARS Coronavirus 2: NEGATIVE

## 2020-12-17 MED ORDER — FLUCONAZOLE 100MG IVPB
100.0000 mg | Freq: Once | INTRAVENOUS | Status: AC
  Administered 2020-12-17: 100 mg via INTRAVENOUS
  Filled 2020-12-17 (×2): qty 50

## 2020-12-17 MED ORDER — FLUCONAZOLE 100MG IVPB: 100.0000 mg | INTRAVENOUS | Status: DC

## 2020-12-17 MED ORDER — CHLORHEXIDINE GLUCONATE CLOTH 2 % EX PADS
6.0000 | MEDICATED_PAD | Freq: Every day | CUTANEOUS | Status: DC
  Administered 2020-12-17 – 2020-12-24 (×8): 6 via TOPICAL

## 2020-12-17 MED ORDER — FLUCONAZOLE 100MG IVPB
50.0000 mg | INTRAVENOUS | Status: DC
  Administered 2020-12-18 – 2020-12-21 (×4): 50 mg via INTRAVENOUS
  Filled 2020-12-17 (×5): qty 25

## 2020-12-17 MED ORDER — OMEGA-3-ACID ETHYL ESTERS 1 G PO CAPS
1.0000 g | ORAL_CAPSULE | Freq: Every day | ORAL | Status: DC
  Administered 2020-12-18 – 2020-12-24 (×6): 1 g via ORAL
  Filled 2020-12-17 (×6): qty 1

## 2020-12-17 NOTE — Progress Notes (Signed)
Patient ID: Lonnie Sermons., male   DOB: 1926-11-07, 87 y.o.   MRN: [id number] [facility] PROGRESS NOTE  Lonnie [name]. ERD:[id number] DOB: Oct 08, 1926 DOA: 12/16/2020 PCP: Derinda Late, MD  HPI/Subjective: Patient felt horrible coming into the hospital.  He was recently discharged after being treated for acute cholecystitis and a gallbladder drain was placed.  He is feeling a little bit better today.  No nausea or vomiting.  No abdominal pain.  Some cough.  No diarrhea.  Objective: Vitals:   12/17/20 1000 12/17/20 1202  BP: (!) 138/52 (!) 126/51  Pulse: 95 81  Resp: 20 20  Temp:  98.3 F (36.8 C)  SpO2: 95% 98%    Intake/Output Summary (Last 24 hours) at 12/17/2020 1352 Last data filed at 12/17/2020 1002 Gross per 24 hour  Intake 240 ml  Output 600 ml  Net -360 ml   Filed Weights   12/16/20 1116 12/17/20 1206  Weight: 83 kg 83.4 kg    ROS: Review of Systems  Respiratory: Positive for cough. Negative for shortness of breath.   Cardiovascular: Negative for chest pain.  Gastrointestinal: Negative for abdominal pain, nausea and vomiting.   Exam: Physical Exam HENT:     Head: Normocephalic.     Mouth/Throat:     Pharynx: No oropharyngeal exudate.     Comments: Thrush on the tongue. Eyes:     General: Lids are normal.     Conjunctiva/sclera: Conjunctivae normal.  Cardiovascular:     Rate and Rhythm: Normal rate and regular rhythm.     Heart sounds: Normal heart sounds, S1 normal and S2 normal.  Pulmonary:     Breath sounds: Examination of the right-lower field reveals decreased breath sounds. Decreased breath sounds present. No wheezing, rhonchi or rales.  Abdominal:     Palpations: Abdomen is soft.     Tenderness: There is no abdominal tenderness.  Musculoskeletal:     Right lower leg: Swelling present.     Left lower leg: Swelling present.  Skin:    General: Skin is warm.     Comments: Slight redness in the groin  Neurological:     Mental Status: He  is alert and oriented to person, place, and time.       Data Reviewed: Basic Metabolic Panel: Recent Labs  Lab 12/13/20 0422 12/14/20 0735 12/16/20 1119 12/16/20 2030 12/17/20 0504  NA 138 140 138 136 138  K 3.8 4.2 4.1 4.3 3.9  CL 112* 114* 113* 113* 116*  CO2 20* 20* 15* 16* 14*  GLUCOSE 109* 100* 170* 109* 99  BUN 45* 40* 56* 57* 58*  CREATININE 1.75* 1.64* 2.51* 2.60* 2.62*  CALCIUM 8.8* 8.6* 8.4* 8.1* 8.0*  MG  --   --   --  3.0*  --    Liver Function Tests: Recent Labs  Lab 12/16/20 1119  AST 26  ALT 22  ALKPHOS 62  BILITOT 0.6  PROT 6.3*  ALBUMIN 3.1*   Recent Labs  Lab 12/16/20 1119  LIPASE 89*   CBC: Recent Labs  Lab 12/11/20 0421 12/12/20 0504 12/16/20 1119 12/17/20 0504  WBC 11.1* 8.4 16.7* 19.0*  HGB 10.7* 10.2* 10.1* 9.4*  HCT 32.1* 31.2* 30.9* 28.2*  MCV 98.2 99.0 100.7* 100.7*  PLT 166 171 210 195     Recent Results (from the past 240 hour(s))  SARS CORONAVIRUS 2 (TAT 6-24 HRS) Nasopharyngeal Nasopharyngeal Swab     Status: None   Collection Time: 12/08/20  1:00 PM  Specimen: Nasopharyngeal Swab  Result Value Ref Range Status   SARS Coronavirus 2 NEGATIVE NEGATIVE Final    Comment: (NOTE) SARS-CoV-2 target nucleic acids are NOT DETECTED.  The SARS-CoV-2 RNA is generally detectable in upper and lower respiratory specimens during the acute phase of infection. Negative results do not preclude SARS-CoV-2 infection, do not rule out co-infections with other pathogens, and should not be used as the sole basis for treatment or other patient management decisions. Negative results must be combined with clinical observations, patient history, and epidemiological information. The expected result is Negative.  Fact Sheet for Patients: SugarRoll.be  Fact Sheet for Healthcare Providers: https://www.woods-mathews.com/  This test is not yet approved or cleared by the [place] FDA and  has been  authorized for detection and/or diagnosis of SARS-CoV-2 by FDA under an Emergency Use Authorization (EUA). This EUA will remain  in effect (meaning this test can be used) for the duration of the COVID-19 declaration under Se ction 564(b)(1) of the Act, 21 U.S.C. section 360bbb-3(b)(1), unless the authorization is terminated or revoked sooner.  Performed at [facility], [place] [address], [place], [place] [address]   Culture, blood (Routine X 2) w Reflex to ID Panel     Status: None   Collection Time: 12/08/20  1:00 PM   Specimen: BLOOD  Result Value Ref Range Status   Specimen Description BLOOD BLOOD LEFT WRIST  Final   Special Requests   Final    BOTTLES DRAWN AEROBIC AND ANAEROBIC Blood Culture adequate volume   Culture   Final    NO GROWTH 5 DAYS Performed at [facility], [address] [place], [place] [address]    Report Status 12/13/2020 FINAL  Final  Culture, blood (Routine X 2) w Reflex to ID Panel     Status: None   Collection Time: 12/08/20  1:12 PM   Specimen: BLOOD  Result Value Ref Range Status   Specimen Description BLOOD LEFT ANTECUBITAL  Final   Special Requests   Final    BOTTLES DRAWN AEROBIC AND ANAEROBIC Blood Culture results may not be optimal due to an excessive volume of blood received in culture bottles   Culture   Final    NO GROWTH 5 DAYS Performed at [facility], [address] [place], [place] [address]    Report Status 12/13/2020 FINAL  Final  MRSA PCR Screening     Status: None   Collection Time: 12/08/20 11:03 PM   Specimen: Nasopharyngeal  Result Value Ref Range Status   MRSA by PCR NEGATIVE NEGATIVE Final    Comment:        The GeneXpert MRSA Assay (FDA approved for NASAL specimens only), is one component of a comprehensive MRSA colonization surveillance program. It is not intended to diagnose MRSA infection nor to guide or monitor treatment for MRSA infections. Performed at [facility], [address], [place],  [address]   Aerobic/Anaerobic Culture (surgical/deep wound)     Status: None   Collection Time: 12/09/20 12:20 PM   Specimen: Abscess  Result Value Ref Range Status   Specimen Description   Final    ABSCESS Performed at [facility], [address], [place],  [address]    Special Requests POST IMAGE GUIDED CHOLE TUBE PLACEMENT  Final   Gram Stain NO WBC SEEN NO ORGANISMS SEEN   Final   Culture   Final    RARE ENTEROCOCCUS FAECIUM NO ANAEROBES ISOLATED Performed at [facility], [place] [address], [place], [place] [address]    Report Status 12/14/2020 FINAL  Final   Organism ID, Bacteria ENTEROCOCCUS FAECIUM  Final      Susceptibility   Enterococcus faecium - MIC*    AMPICILLIN <=2 SENSITIVE Sensitive     VANCOMYCIN <=0.5 SENSITIVE Sensitive     GENTAMICIN SYNERGY SENSITIVE Sensitive     * RARE ENTEROCOCCUS FAECIUM  Blood culture (routine x 2)     Status: None (Preliminary result)   Collection Time: 12/16/20  3:36 PM   Specimen: BLOOD  Result Value Ref Range Status   Specimen Description BLOOD LEFT ANTECUBITAL  Final   Special Requests   Final    BOTTLES DRAWN AEROBIC AND ANAEROBIC Blood Culture adequate volume   Culture   Final    NO GROWTH < 24 HOURS Performed at [facility], [address], [place], [place] [address]    Report Status PENDING  Incomplete  Blood culture (routine x 2)     Status: None (Preliminary result)   Collection Time: 12/16/20  3:36 PM   Specimen: BLOOD  Result Value Ref Range Status   Specimen Description BLOOD BLOOD LEFT FOREARM  Final   Special Requests   Final    BOTTLES DRAWN AEROBIC AND ANAEROBIC Blood Culture adequate volume   Culture   Final    NO GROWTH < 24 HOURS Performed at [facility], [address], [place], [place] [address]    Report Status PENDING  Incomplete     Studies: CT ABDOMEN PELVIS WO CONTRAST  Result Date: 12/16/2020 CLINICAL DATA:  Bowel  obstruction suspected. Increasing weakness. Recent acute cholecystitis treated with cholecystostomy tube placement. EXAM: CT ABDOMEN AND PELVIS WITHOUT CONTRAST TECHNIQUE: Multidetector CT imaging of the abdomen and pelvis was performed following the standard protocol without IV contrast. COMPARISON:  12/08/2020 FINDINGS: Lower chest: Partially visualized dependent opacities in the right greater than left lower lobes suggesting atelectasis and scarring with asymmetric bronchiectasis on the right. Decreased size of nodular left lower lobe opacity favoring resolving infection. Calcified pleural plaques bilaterally. No pleural effusion. Coronary atherosclerosis. Hepatobiliary: No focal liver abnormality is seen. A cholecystostomy tube is in place. There is a 3 mm stone within the collapsed gallbladder. No fluid collection is present in the gallbladder fossa. There is no biliary dilatation. Pancreas: Unremarkable. Spleen: Unremarkable. Adrenals/Urinary Tract: Unremarkable adrenal glands. Bilateral renal vascular calcifications. Bilateral renal scarring. No hydronephrosis. Moderately distended and otherwise unremarkable bladder. Stomach/Bowel: The stomach is moderately distended by gas and fluid. There is no evidence of bowel obstruction or inflammation. A moderate amount of stool is noted in the colon and rectum. There is diverticulosis of the sigmoid colon without evidence of acute diverticulitis. Prior appendectomy. Vascular/Lymphatic: Abdominal aortic atherosclerosis without aneurysm. No enlarged lymph nodes. Reproductive: Status post urolift procedure with similar appearance of mild thickening of the adjacent right-sided pelvic musculature. Other: No intraperitoneal free fluid. Musculoskeletal: No acute osseous abnormality or suspicious osseous lesion. Mild lumbar levoscoliosis. Mild disc and advanced facet degeneration in the lumbar spine. Similar appearance of mixed sclerosis and lucency in the femoral heads  without collapse, query AVN. IMPRESSION: 1. No evidence of bowel obstruction. 2. Cholecystostomy tube in place with a 3 mm stone in the decompressed gallbladder. No fluid collection in the gallbladder fossa. 3. Decreased nodular left lower lobe pulmonary opacity favoring resolving pneumonia. 4. Aortic Atherosclerosis (ICD10-I70.0). Electronically Signed   By: Logan Bores M.D.   On: 12/16/2020 13:56   DG Chest  2 View  Result Date: 12/16/2020 CLINICAL DATA:  87 year old male with history of cough and shortness of breath. EXAM: CHEST - 2 VIEW COMPARISON:  Chest x-ray 12/11/2020. FINDINGS: Calcified pleural plaques are again noted in the thorax bilaterally indicative of asbestos related pleural disease. Lung volumes are low. Chronic elevation of the left hemidiaphragm, similar to prior studies. No consolidative airspace disease. No pleural effusions. No pneumothorax. No pulmonary nodule or mass noted. Pulmonary vasculature and the cardiomediastinal silhouette are within normal limits. Atherosclerotic calcifications in the thoracic aorta. IMPRESSION: 1. Low lung volumes without radiographic evidence of acute cardiopulmonary disease. 2. Aortic atherosclerosis. 3. Asbestos related pleural disease redemonstrated. Electronically Signed   By: Vinnie Langton M.D.   On: 12/16/2020 13:54    Scheduled Meds: . alfuzosin  10 mg Oral Q breakfast  . allopurinol  150 mg Oral q morning  . vitamin C  250 mg Oral BID  . aspirin EC  81 mg Oral Daily  . atorvastatin  40 mg Oral q morning  . Chlorhexidine Gluconate Cloth  6 each Topical Daily  . enoxaparin (LOVENOX) injection  30 mg Subcutaneous Q24H  . ferrous sulfate  325 mg Oral BID WC  . finasteride  5 mg Oral BH-q7a  . gabapentin  300 mg Oral BID  . Ipratropium-Albuterol  1 puff Inhalation Daily  . magnesium oxide  400 mg Oral q morning  . melatonin  5 mg Oral QHS  . mometasone-formoterol  2 puff Inhalation BID  . NIFEdipine  60 mg Oral q morning  . nystatin    Topical TID  . [START ON 12/18/2020] omega-3 acid ethyl esters  1 g Oral Daily  . pantoprazole  40 mg Oral Daily  . senna-docusate  2 tablet Oral Daily  . vitamin B-12  1,000 mcg Oral Daily   Continuous Infusions: . sodium chloride 75 mL/hr at 12/17/20 0514  . ampicillin-sulbactam (UNASYN) IV Stopped (12/17/20 0645)  . fluconazole (DIFLUCAN) IV     Followed by  . [START ON 12/18/2020] fluconazole (DIFLUCAN) IV      Assessment/Plan:  1. Acute kidney injury on chronic kidney disease stage IIIa.  Today's creatinine 2.62.  Continue IV fluid hydration.  Baseline creatinine around 1.64 on previous disposition. 2. Acute cholecystitis status post percutaneous gallbladder drain.  Patient with leukocytosis.  Previous culture growing Enterococcus.  Chest x-ray shows resolving lobar pneumonia on IV Unasyn. 3. Wide-complex tachycardia.  Received 1 dose of amiodarone.  Currently on Adalat CC. 4. Thrush.  We will give IV Diflucan 5. BPH on alfuzosin and Proscar 6. Essential hypertension on nifedipine 7. Weakness.  Physical therapy evaluation        Code Status:     Code Status Orders  (From admission, onward)         Start     Ordered   12/16/20 1709  Full code  Continuous        12/16/20 1710        Code Status History    Date Active Date Inactive Code Status Order ID Comments User Context   12/08/2020 1621 12/14/2020 2348 Full Code [id number]  CoxBriant Cedar, DO ED   08/11/2018 2104 08/15/2018 1748 Full Code [id number]  Vaughan Basta, MD Inpatient   01/10/2016 2016 01/18/2016 1943 Full Code [id number]  Henreitta Leber, MD Inpatient   Advance Care Planning Activity    Advance Directive Documentation   Flowsheet Row Most Recent Value  Type of Advance Directive Healthcare Power of [place], Living will  Pre-existing out of facility DNR order (yellow form or pink MOST form) -  "MOST" Form in Place? -     Family Communication: Spoke with daughter at the bedside Disposition Plan:  Status is: Inpatient  Dispo: The patient is from: Home              Anticipated d/c is to: Home versus rehab              Patient currently on IV antibiotics and IV fluids to try to get creatinine better treat infection   Difficult to place patient.  Hopefully not  Consultants:  General surgery  Antibiotics:  Unasyn  Time spent: 28 minutes  [place]

## 2020-12-17 NOTE — ED Notes (Signed)
Patient is resting comfortably. 

## 2020-12-17 NOTE — ED Notes (Signed)
Pt repositioned. Patient is resting comfortably.

## 2020-12-18 DIAGNOSIS — I472 Ventricular tachycardia: Secondary | ICD-10-CM | POA: Diagnosis not present

## 2020-12-18 DIAGNOSIS — N401 Enlarged prostate with lower urinary tract symptoms: Secondary | ICD-10-CM

## 2020-12-18 DIAGNOSIS — N138 Other obstructive and reflux uropathy: Secondary | ICD-10-CM

## 2020-12-18 DIAGNOSIS — R531 Weakness: Secondary | ICD-10-CM | POA: Diagnosis not present

## 2020-12-18 DIAGNOSIS — N179 Acute kidney failure, unspecified: Secondary | ICD-10-CM | POA: Diagnosis not present

## 2020-12-18 DIAGNOSIS — K810 Acute cholecystitis: Secondary | ICD-10-CM | POA: Diagnosis not present

## 2020-12-18 LAB — CBC
HCT: 27 % — ABNORMAL LOW (ref 39.0–52.0)
Hemoglobin: 8.9 g/dL — ABNORMAL LOW (ref 13.0–17.0)
MCH: 32.8 pg (ref 26.0–34.0)
MCHC: 33 g/dL (ref 30.0–36.0)
MCV: 99.6 fL (ref 80.0–100.0)
Platelets: 187 10*3/uL (ref 150–400)
RBC: 2.71 MIL/uL — ABNORMAL LOW (ref 4.22–5.81)
RDW: 14 % (ref 11.5–15.5)
WBC: 16 10*3/uL — ABNORMAL HIGH (ref 4.0–10.5)
nRBC: 0 % (ref 0.0–0.2)

## 2020-12-18 LAB — COMPREHENSIVE METABOLIC PANEL
ALT: 20 U/L (ref 0–44)
AST: 20 U/L (ref 15–41)
Albumin: 2.5 g/dL — ABNORMAL LOW (ref 3.5–5.0)
Alkaline Phosphatase: 60 U/L (ref 38–126)
Anion gap: 7 (ref 5–15)
BUN: 58 mg/dL — ABNORMAL HIGH (ref 8–23)
CO2: 14 mmol/L — ABNORMAL LOW (ref 22–32)
Calcium: 8.2 mg/dL — ABNORMAL LOW (ref 8.9–10.3)
Chloride: 117 mmol/L — ABNORMAL HIGH (ref 98–111)
Creatinine, Ser: 2.59 mg/dL — ABNORMAL HIGH (ref 0.61–1.24)
GFR, Estimated: 23 mL/min — ABNORMAL LOW (ref >60)
Glucose, Bld: 101 mg/dL — ABNORMAL HIGH (ref 70–99)
Potassium: 4.1 mmol/L (ref 3.5–5.1)
Sodium: 138 mmol/L (ref 135–145)
Total Bilirubin: 0.7 mg/dL (ref 0.3–1.2)
Total Protein: 5.5 g/dL — ABNORMAL LOW (ref 6.5–8.1)

## 2020-12-18 LAB — LIPASE, BLOOD: Lipase: 74 U/L — ABNORMAL HIGH (ref 11–51)

## 2020-12-18 MED ORDER — GABAPENTIN 100 MG PO CAPS
100.0000 mg | ORAL_CAPSULE | Freq: Two times a day (BID) | ORAL | Status: DC
  Administered 2020-12-18 – 2020-12-24 (×11): 100 mg via ORAL
  Filled 2020-12-18 (×11): qty 1

## 2020-12-18 MED ORDER — IPRATROPIUM-ALBUTEROL 20-100 MCG/ACT IN AERS
2.0000 | INHALATION_SPRAY | Freq: Four times a day (QID) | RESPIRATORY_TRACT | Status: DC
  Administered 2020-12-18 – 2020-12-24 (×22): 2 via RESPIRATORY_TRACT
  Filled 2020-12-18 (×2): qty 4

## 2020-12-18 MED ORDER — ENSURE ENLIVE PO LIQD
237.0000 mL | Freq: Two times a day (BID) | ORAL | Status: DC
  Administered 2020-12-18 – 2020-12-22 (×6): 237 mL via ORAL

## 2020-12-18 MED ORDER — ALLOPURINOL 100 MG PO TABS
50.0000 mg | ORAL_TABLET | Freq: Every morning | ORAL | Status: DC
  Administered 2020-12-19 – 2020-12-24 (×5): 50 mg via ORAL
  Filled 2020-12-18 (×5): qty 1

## 2020-12-18 NOTE — TOC Progression Note (Signed)
Transition of Care (TOC) - Progression Note    Patient Details  Name: Kashton Mcartor. MRN: [id number] Date of Birth: 08/25/1927  Transition of Care [facility]) CM/SW Contact  Shelbie Hutching, RN Phone Number: 12/18/2020, 4:24 PM  Clinical Narrative:    Clinicals sent via secure email to the [place] social worker Steva Ready.  [facility] will be able to assist with placement and they can cover long term care.  [employer] is in network with the [place].  TOC will follow up on Monday, there are no bed offers at this time.    Expected Discharge Plan: [place] Barriers to Discharge: Continued Medical Work up  Expected Discharge Plan and Services Expected Discharge Plan: [place]   Discharge Planning Services: CM Consult Post Acute Care Choice: [place] Living arrangements for the past 2 months: Single Family Home                 DME Arranged: N/A DME Agency: NA       HH Arranged: NA HH Agency: NA         Social Determinants of Health (SDOH) Interventions    Readmission Risk Interventions Readmission Risk Prevention Plan 12/18/2020  Transportation Screening Complete  PCP or Specialist Appt within 3-5 Days Complete  HRI or [place] Complete  Social Work Consult for [place] Planning/Counseling Complete  Palliative Care Screening Not Applicable  Medication Review Press photographer) Complete  Some recent data might be hidden

## 2020-12-18 NOTE — Plan of Care (Signed)
°  Problem: Education: Goal: Knowledge of General Education information will improve Description: Including pain rating scale, medication(s)/side effects and non-pharmacologic comfort measures Outcome: Progressing   Problem: Clinical Measurements: Goal: Ability to maintain clinical measurements within normal limits will improve Outcome: Progressing Goal: Diagnostic test results will improve Outcome: Progressing   Problem: Activity: Goal: Risk for activity intolerance will decrease Outcome: Progressing   Problem: Elimination: Goal: Will not experience complications related to bowel motility Outcome: Progressing Goal: Will not experience complications related to urinary retention Outcome: Progressing   Problem: Pain Managment: Goal: General experience of comfort will improve Outcome: Progressing   Problem: Safety: Goal: Ability to remain free from injury will improve Outcome: Progressing

## 2020-12-18 NOTE — Evaluation (Signed)
Physical Therapy Evaluation Patient Details Name: Lonnie [name]. MRN: [id number] DOB: 26-Mar-1927 Today's Date: 12/18/2020   History of Present Illness  Patient is a 87 year old male who was brought into the ER by EMS for evaluation of weakness and abdominal pain.  Patient was recently discharged from hospital following treatment for acute cholecystitis.  He is status post cholecystostomy tube placement and culture yielded Enterococcus faecium.  Patient was discharged home on Augmentin.  Labs show worsening leukocytosis with a left shift and normal lactic acid level.  Imaging shows gas /fluid-filled distended stomach.  He will be admitted to the hospital for further evaluation. PMH: anemia, L BBB, CKD stage 3, COPD, CAD, GERD, hx of asbestosis, HLD, HTN, penumonia, PSA elevation  Clinical Impression  Pt seen for PT evaluation with pt reporting he went home & sat in his recliner most of the day following d/c from most recent hospitalization -- PT educated pt on importance of ambulating & maintaining CLOF. Pt requires extra time but no physical assist, only cuing for technique, for bed mobility but requires heavy MIN assist & MAX cuing for safety awareness & proper use of AD (but poor return demo from pt) to ambulate around bed to recliner. Pt coughing throughout mobility & endorses SOB despite WNL SpO2 & HR. PT educates pt on need to use incentive spirometer & encourages OOB mobility to recliner. Pt visibly significantly fatigued following minimal activity. Pt would benefit from STR upon d/c to maximize independence with functional mobility & reduce fall risk prior to return home. Will continue to follow pt acutely to focus on strength & endurance training, safety with mobility & progress gait with LRAD.      Follow Up Recommendations SNF;Supervision/Assistance - 24 hour;Supervision for mobility/OOB    Equipment Recommendations  None recommended by PT    Recommendations for Other Services OT  consult     Precautions / Restrictions Precautions Precautions: Fall Precaution Comments: R side drain Restrictions Weight Bearing Restrictions: No      Mobility  Bed Mobility Overal bed mobility: Needs Assistance Bed Mobility: Supine to Sit     Supine to sit: Supervision;HOB elevated     General bed mobility comments: significantly extra time, cuing for hand placement & to use bed rails to pull to sitting, CGA when uprighting trunk    Transfers Overall transfer level: Needs assistance Equipment used: Rolling walker (2 wheeled) Transfers: Sit to/from Stand Sit to Stand: Min assist         General transfer comment: cuing for hand placement  Ambulation/Gait Ambulation/Gait assistance: Min assist Gait Distance (Feet): 10 Feet Assistive device: Rolling walker (2 wheeled) Gait Pattern/deviations: Decreased step length - right;Decreased step length - left;Decreased stride length;Trunk flexed Gait velocity: decreased   General Gait Details: MAX cuing & poor return demo to ambulate within base of AD, even worse when turning, pt with decreased safety awareness overall, using RW to push IV pole away despite PT education re: PT managing IV pole  Stairs            Wheelchair Mobility    Modified Rankin (Stroke Patients Only)       Balance Overall balance assessment: Needs assistance Sitting-balance support: Feet supported Sitting balance-Leahy Scale: Fair Sitting balance - Comments: close supervision sitting EOB   Standing balance support: Bilateral upper extremity supported;During functional activity Standing balance-Leahy Scale: Poor Standing balance comment: BUE support & min assist during gait  Pertinent Vitals/Pain Pain Assessment: No/denies pain    Home Living Family/patient expects to be discharged to:: Private residence Living Arrangements: Alone Available Help at Discharge: Family;Neighbor;Available  PRN/intermittently;Other (Comment) (daughter was staying with him following d/c from most recent hospitalization) Type of Home: House Home Access: Ramped entrance     Home Layout: One [place]: [place] - 4 wheels;Cane - single point;Electric scooter Additional Comments: Pt reports he went home & sat in his recliner most of the day after d/c following most recent hospital stay    Prior Function Level of Independence: Independent with assistive device(s)         Comments: Pt uses SPC in home, electric scooter for community ambulation. has meals on wheels, able to prepare own meals.     Hand Dominance   Dominant Hand: Right    Extremity/Trunk Assessment   Upper Extremity Assessment Upper Extremity Assessment: Generalized weakness    Lower Extremity Assessment Lower Extremity Assessment: Generalized weakness (2/5 LLE knee extension in sitting, 3-/5 R knee extension in sitting)       Communication   Communication: No difficulties  Cognition Arousal/Alertness: Awake/alert Behavior During Therapy: WFL for tasks assessed/performed Overall Cognitive Status: Within Functional Limits for tasks assessed                                 General Comments: AxO x 4, very poor safety awareness with mobility while using RW      General Comments General comments (skin integrity, edema, etc.): SpO2 94%, HR 85 bpm after ambulating to recliner despite pt coughing throughout mobility & appearing SOB; PT educated on how & how frequently to use incentive spirometer    Exercises     Assessment/Plan    PT Assessment Patient needs continued PT services  PT Problem List Decreased mobility;Decreased activity tolerance;Decreased balance;Decreased knowledge of use of DME;Pain;Decreased strength;Decreased safety awareness;Cardiopulmonary status limiting activity       PT Treatment Interventions DME instruction;Therapeutic exercise;Gait training;Balance  training;Neuromuscular re-education;Functional mobility training;Therapeutic activities;Patient/family education    PT Goals (Current goals can be found in the Care Plan section)  Acute Rehab PT Goals Patient Stated Goal: none stated PT Goal Formulation: With patient Time For Goal Achievement: 01/01/21 Potential to Achieve Goals: Fair    Frequency Min 2X/week   Barriers to discharge        Co-evaluation               AM-PAC PT "6 Clicks" Mobility  Outcome Measure Help needed turning from your back to your side while in a flat bed without using bedrails?: A Little Help needed moving from lying on your back to sitting on the side of a flat bed without using bedrails?: A Lot Help needed moving to and from a bed to a chair (including a wheelchair)?: A Lot Help needed standing up from a chair using your arms (e.g., wheelchair or bedside chair)?: A Little Help needed to walk in hospital room?: A Lot Help needed climbing 3-5 steps with a railing? : Total 6 Click Score: 13    End of Session Equipment Utilized During Treatment: Gait belt Activity Tolerance: Patient limited by fatigue Patient left: in chair;with call bell/phone within reach;with chair alarm set Nurse Communication: Mobility status (coughing/SOB) PT Visit Diagnosis: Unsteadiness on feet (R26.81);Difficulty in walking, not elsewhere classified (R26.2);Muscle weakness (generalized) (M62.81)    Time: 3790-2409 PT Time Calculation (min) (ACUTE ONLY): 16 min  Charges:   PT Evaluation $PT Eval Low Complexity: [place], PT, DPT 12/18/20, 11:25 AM   Waunita Schooner 12/18/2020, 11:22 AM

## 2020-12-18 NOTE — Consult Note (Signed)
Pharmacy Antibiotic Note  Lonnie [name]. is a 87 y.o. male admitted on 12/16/2020 with intra-abdominal infection.  Pharmacy has been consulted for Unasyn dosing. Pt was discharged on augmentin 3/14 and returned due to feeling weak, abdomen is distended and complains of productive cough. Abscess culture growing RARE ENTEROCOCCUS FAECIUM.   Plan: Day 7 of total abx from previous admission.   Will continue Unasyn 3 g q12H per current renal function.   Height: 6' (182.9 cm) Weight: 83.4 kg (183 lb 13.8 oz) IBW/kg (Calculated) : 77.6  Temp (24hrs), Avg:98.7 F (37.1 C), Min:97.9 F (36.6 C), Max:99.8 F (37.7 C)  Recent Labs  Lab 12/12/20 0504 12/13/20 0422 12/14/20 0735 12/16/20 1119 12/16/20 1535 12/16/20 2030 12/17/20 0504 12/18/20 0732  WBC 8.4  --   --  16.7*  --   --  19.0* 16.0*  CREATININE 1.99*   < > 1.64* 2.51*  --  2.60* 2.62* 2.59*  LATICACIDVEN  --   --   --   --  1.3  --   --   --    < > = values in this interval not displayed.    Estimated Creatinine Clearance: 20 mL/min (A) (by C-G formula based on SCr of 2.59 mg/dL (H)).    Allergies  Allergen Reactions  . Sulfa Antibiotics Other (See Comments)    Not sure of reaction. It was too long ago, but he said not to give it to him.   . Sulfasalazine     Other reaction(s): Other (See Comments) Not sure of reaction. It was too long ago, but he said not to give it to him.   Marland Kitchen Penicillin G Rash    Tolerates ampicillin and amoxicillin    Antimicrobials this admission: 3/8 ceftriaxone + flagyl >> 3/11 3/11 linezolid >> 3/13  3/12 Unasyn >> 3/13  3/14 Augmentin x 1  3/16 Unasyn >  Dose adjustments this admission: None  Microbiology results: 3/16 BCx: NG2D 3/9 Abscess cx: RARE ENTEROCOCCUS FAECIUM (pan-sensitive)   3/8 MRSA PCR: negative  Thank you for allowing pharmacy to be a part of this patient's care.  Lu Duffel, PharmD, BCPS Clinical Pharmacist 12/18/2020 2:13 PM

## 2020-12-18 NOTE — Progress Notes (Signed)
Patient ID: Lonnie Sermons., male   DOB: 11/12/1926, 87 y.o.   MRN: [id number] [facility] PROGRESS NOTE  Shizuo A [name]. GGY:[id number] DOB: 09/17/1927 DOA: 12/16/2020 PCP: Derinda Late, MD  HPI/Subjective: Patient feeling a little bit better.  Still has some cough.  No abdominal pain.  No nausea or vomiting.  Ate a little bit for dinner last night.  Admitted with feeling weak and started on antibiotics.    Objective: Vitals:   12/18/20 0754 12/18/20 1208  BP: (!) 140/54 (!) 123/51  Pulse: 79 82  Resp: 16 (!) 22  Temp: 98.5 F (36.9 C) 97.9 F (36.6 C)  SpO2:  100%    Intake/Output Summary (Last 24 hours) at 12/18/2020 1409 Last data filed at 12/18/2020 1358 Gross per 24 hour  Intake 3098.57 ml  Output 1120 ml  Net 1978.57 ml   Filed Weights   12/16/20 1116 12/17/20 1206  Weight: 83 kg 83.4 kg    ROS: Review of Systems  Respiratory: Positive for cough. Negative for shortness of breath.   Cardiovascular: Negative for chest pain.  Gastrointestinal: Negative for abdominal pain, nausea and vomiting.   Exam: Physical Exam HENT:     Head: Normocephalic.     Mouth/Throat:     Pharynx: No oropharyngeal exudate.  Eyes:     General: Lids are normal.     Conjunctiva/sclera: Conjunctivae normal.     Pupils: Pupils are equal, round, and reactive to light.  Cardiovascular:     Rate and Rhythm: Normal rate and regular rhythm.     Heart sounds: Normal heart sounds, S1 normal and S2 normal.  Pulmonary:     Breath sounds: Examination of the right-lower field reveals decreased breath sounds. Examination of the left-lower field reveals decreased breath sounds. Decreased breath sounds present. No wheezing, rhonchi or rales.  Abdominal:     Palpations: Abdomen is soft.     Tenderness: There is no abdominal tenderness.  Musculoskeletal:     Right lower leg: Swelling present.     Left lower leg: Swelling present.  Skin:    General: Skin is warm.     Findings: No rash.   Neurological:     Mental Status: He is alert and oriented to person, place, and time.       Data Reviewed: Basic Metabolic Panel: Recent Labs  Lab 12/14/20 0735 12/16/20 1119 12/16/20 2030 12/17/20 0504 12/18/20 0732  NA 140 138 136 138 138  K 4.2 4.1 4.3 3.9 4.1  CL 114* 113* 113* 116* 117*  CO2 20* 15* 16* 14* 14*  GLUCOSE 100* 170* 109* 99 101*  BUN 40* 56* 57* 58* 58*  CREATININE 1.64* 2.51* 2.60* 2.62* 2.59*  CALCIUM 8.6* 8.4* 8.1* 8.0* 8.2*  MG  --   --  3.0*  --   --    Liver Function Tests: Recent Labs  Lab 12/16/20 1119 12/18/20 0732  AST 26 20  ALT 22 20  ALKPHOS 62 60  BILITOT 0.6 0.7  PROT 6.3* 5.5*  ALBUMIN 3.1* 2.5*   Recent Labs  Lab 12/16/20 1119 12/18/20 0732  LIPASE 89* 74*   CBC: Recent Labs  Lab 12/12/20 0504 12/16/20 1119 12/17/20 0504 12/18/20 0732  WBC 8.4 16.7* 19.0* 16.0*  HGB 10.2* 10.1* 9.4* 8.9*  HCT 31.2* 30.9* 28.2* 27.0*  MCV 99.0 100.7* 100.7* 99.6  PLT 171 210 195 187     Recent Results (from the past 240 hour(s))  Culture, blood (Routine X 2) w  Reflex to ID Panel     Status: None   Collection Time: 12/08/20  1:12 PM   Specimen: BLOOD  Result Value Ref Range Status   Specimen Description BLOOD LEFT ANTECUBITAL  Final   Special Requests   Final    BOTTLES DRAWN AEROBIC AND ANAEROBIC Blood Culture results may not be optimal due to an excessive volume of blood received in culture bottles   Culture   Final    NO GROWTH 5 DAYS Performed at [facility] [address] [place], [place] [address]    Report Status 12/13/2020 FINAL  Final  MRSA PCR Screening     Status: None   Collection Time: 12/08/20 11:03 PM   Specimen: Nasopharyngeal  Result Value Ref Range Status   MRSA by PCR NEGATIVE NEGATIVE Final    Comment:        The GeneXpert MRSA Assay (FDA approved for NASAL specimens only), is one component of a comprehensive MRSA colonization surveillance program. It is not intended to diagnose  MRSA infection nor to guide or monitor treatment for MRSA infections. Performed at [facility], [address], [place], [place] [address]   Aerobic/Anaerobic Culture (surgical/deep wound)     Status: None   Collection Time: 12/09/20 12:20 PM   Specimen: Abscess  Result Value Ref Range Status   Specimen Description   Final    ABSCESS Performed at [facility], [address], [place], [place] [address]    Special Requests POST IMAGE GUIDED CHOLE TUBE PLACEMENT  Final   Gram Stain NO WBC SEEN NO ORGANISMS SEEN   Final   Culture   Final    RARE ENTEROCOCCUS FAECIUM NO ANAEROBES ISOLATED Performed at [facility], [place] [address] [place], [place] [address]    Report Status 12/14/2020 FINAL  Final   Organism ID, Bacteria ENTEROCOCCUS FAECIUM  Final      Susceptibility   Enterococcus faecium - MIC*    AMPICILLIN <=2 SENSITIVE Sensitive     VANCOMYCIN <=0.5 SENSITIVE Sensitive     GENTAMICIN SYNERGY SENSITIVE Sensitive     * RARE ENTEROCOCCUS FAECIUM  Blood culture (routine x 2)     Status: None (Preliminary result)   Collection Time: 12/16/20  3:36 PM   Specimen: BLOOD  Result Value Ref Range Status   Specimen Description BLOOD LEFT ANTECUBITAL  Final   Special Requests   Final    BOTTLES DRAWN AEROBIC AND ANAEROBIC Blood Culture adequate volume   Culture   Final    NO GROWTH 2 DAYS Performed at [facility], [address], [place], [place] [address]    Report Status PENDING  Incomplete  Blood culture (routine x 2)     Status: None (Preliminary result)   Collection Time: 12/16/20  3:36 PM   Specimen: BLOOD  Result Value Ref Range Status   Specimen Description BLOOD BLOOD LEFT FOREARM  Final   Special Requests   Final    BOTTLES DRAWN AEROBIC AND ANAEROBIC Blood Culture adequate volume   Culture   Final    NO GROWTH 2 DAYS Performed at [facility], [place],  [address]    Report Status PENDING   Incomplete  SARS CORONAVIRUS 2 (TAT 6-24 HRS) Nasopharyngeal Nasopharyngeal Swab     Status: None   Collection Time: 12/17/20 10:23 AM   Specimen: Nasopharyngeal Swab  Result Value Ref Range Status   SARS Coronavirus 2 NEGATIVE NEGATIVE Final  Comment: (NOTE) SARS-CoV-2 target nucleic acids are NOT DETECTED.  The SARS-CoV-2 RNA is generally detectable in upper and lower respiratory specimens during the acute phase of infection. Negative results do not preclude SARS-CoV-2 infection, do not rule out co-infections with other pathogens, and should not be used as the sole basis for treatment or other patient management decisions. Negative results must be combined with clinical observations, patient history, and epidemiological information. The expected result is Negative.  Fact Sheet for Patients: SugarRoll.be  Fact Sheet for Healthcare Providers: https://www.woods-mathews.com/  This test is not yet approved or cleared by the [place] FDA and  has been authorized for detection and/or diagnosis of SARS-CoV-2 by FDA under an Emergency Use Authorization (EUA). This EUA will remain  in effect (meaning this test can be used) for the duration of the COVID-19 declaration under Se ction 564(b)(1) of the Act, 21 U.S.C. section 360bbb-3(b)(1), unless the authorization is terminated or revoked sooner.  Performed at [facility], [place] [address] , [place] [address]       Scheduled Meds: . alfuzosin  10 mg Oral Q breakfast  . [START ON 12/19/2020] allopurinol  50 mg Oral q morning  . vitamin C  250 mg Oral BID  . aspirin EC  81 mg Oral Daily  . atorvastatin  40 mg Oral q morning  . Chlorhexidine Gluconate Cloth  6 each Topical Daily  . enoxaparin (LOVENOX) injection  30 mg Subcutaneous Q24H  . ferrous sulfate  325 mg Oral BID WC  . finasteride  5 mg Oral BH-q7a  . gabapentin  100 mg Oral BID  . Ipratropium-Albuterol  2 puff  Inhalation QID  . magnesium oxide  400 mg Oral q morning  . melatonin  5 mg Oral QHS  . mometasone-formoterol  2 puff Inhalation BID  . NIFEdipine  60 mg Oral q morning  . nystatin   Topical TID  . omega-3 acid ethyl esters  1 g Oral Daily  . pantoprazole  40 mg Oral Daily  . senna-docusate  2 tablet Oral Daily  . vitamin B-12  1,000 mcg Oral Daily   Continuous Infusions: . sodium chloride 40 mL/hr at 12/18/20 0821  . ampicillin-sulbactam (UNASYN) IV Stopped (12/18/20 0536)  . fluconazole (DIFLUCAN) IV 50 mg (12/18/20 1249)    Assessment/Plan:  1. Acute kidney injury on chronic disease stage IIIa.  Creatinine has not improved much since coming into the hospital despite Foley catheter placement and IV fluids.  We will get nephrology consultation.  Today's creatinine 2.59.  Baseline creatinine 1.64. 2. Acute cholecystitis status post percutaneous gallbladder drain.  With leukocytosis and previous pneumonia.  Patient was placed on IV Unasyn.  Previous culture growing Enterococcus.  Lipase slightly elevated.  Not having any abdominal pain.  CT scan did not show any inflammation of the pancreas.  General surgery also following. 3. Wide-complex tachycardia on presentation received 1 dose of amiodarone currently on Adalat CC.  We will get an echocardiogram 4. Previous pneumonia.  Continue inhalers.  Chest PT. 5. Thrush on IV Diflucan 6. BPH on alfuzosin and Proscar 7. Essential hypertension on nifedipine 8. Weakness.  Physical therapy recommends rehab 9. Hyperlipidemia unspecified on atorvastatin    Code Status:     Code Status Orders  (From admission, onward)         Start     Ordered   12/16/20 1709  Full code  Continuous        12/16/20 1710  Code Status History    Date Active Date Inactive Code Status Order ID Comments User Context   12/08/2020 1621 12/14/2020 2348 Full Code [id number]  Criss Alvine, DO ED   08/11/2018 2104 08/15/2018 1748 Full Code [id number]  Vaughan Basta, MD Inpatient   01/10/2016 2016 01/18/2016 1943 Full Code [id number]  Henreitta Leber, MD Inpatient   Advance Care Planning Activity    Advance Directive Documentation   Flowsheet Row Most Recent Value  Type of Advance Directive Healthcare Power of Attorney, Living will  Pre-existing out of facility DNR order (yellow form or pink MOST form) --  "MOST" Form in Place? --     Family Communication: Spoke with patient's daughter on the phone Disposition Plan: Status is: Inpatient  Dispo: The patient is from: Home              Anticipated d/c is to: Rehab              Patient currently on IV antibiotics and IV fluids to improve kidney function and treat infection   Difficult to place patient.  Hopefully not  Consultants:  General surgery  Antibiotics:  Unasyn  Diflucan  Time spent: 28 minutes  [place]

## 2020-12-18 NOTE — TOC Initial Note (Signed)
Transition of Care (TOC) - Initial/Assessment Note    Patient Details  Name: Lonnie [name]. MRN: [id number] Date of Birth: 05-27-1927  Transition of Care [facility]) CM/SW Contact:    Shelbie Hutching, RN Phone Number: 12/18/2020, 1:00 PM  Clinical Narrative:                 Patient admitted to the hospital with weakness and AKI.  Recently admitted for cholecystitis.  RNCM met with patient at the bedside.  Patient reports that he lives alone in [place] with his [name], [name].  Patient has a scooter, ramp, and walker at home.  Patient reports that he drives, but his daughter takes him to appointments.  [facility] and gets his prescriptions there.   PT is recommending SNF and patient agrees to go for short term rehab.  RNCM spoke with daughter, [name] via phone, [name] agrees with rehab.  She is worried that the patient may need long term care and if that is the case would like for the [place] to be able to pay for it.  Patient does have [facility] Medicare so that can be used for the short term rehab.  Daughter prefers [name].   TOC has started SNF workup and bed search.      Expected Discharge Plan: Skilled Nursing Facility Barriers to Discharge: Continued Medical Work up   Patient Goals and CMS Choice Patient states their goals for this hospitalization and ongoing recovery are:: agrees to go to SNFfor rehab CMS Medicare.gov Compare Post Acute Care list provided to:: Patient Choice offered to / list presented to : [name]  Expected Discharge Plan and Services Expected Discharge Plan: [place]   Discharge Planning Services: CM Consult Post Acute Care Choice: [place] Living arrangements for the past 2 months: Single Family Home                 DME Arranged: N/A DME Agency: NA       HH Arranged: NA HH Agency: NA        Prior Living Arrangements/Services Living arrangements for the past 2 months: Single  Family Home Lives with:: Self Patient language and need for interpreter reviewed:: Yes Do you feel safe going back to the place where you live?: Yes      Need for Family Participation in Patient Care: Yes (Comment) (weakness) Care giver support system in place?: Yes (comment) (daughter) Current home services: DME (mobility scotter, walker, ramp) Criminal Activity/Legal Involvement Pertinent to Current Situation/Hospitalization: No - Comment as needed  Activities of Daily Living Home Assistive Devices/Equipment: Dentures (specify type),Walker (specify type) ADL Screening (condition at time of admission) Patient's cognitive ability adequate to safely complete daily activities?: Yes Is the patient deaf or have difficulty hearing?: No Does the patient have difficulty seeing, even when wearing glasses/contacts?: No Does the patient have difficulty concentrating, remembering, or making decisions?: No Patient able to express need for assistance with ADLs?: Yes Does the patient have difficulty dressing or bathing?: No Independently performs ADLs?: Yes (appropriate for developmental age) Does the patient have difficulty walking or climbing stairs?: Yes Weakness of Legs: Both Weakness of Arms/Hands: None  Permission Sought/Granted Permission sought to share information with : Case Manager,Family Chief Financial Officer Permission granted to share information with : Yes, Verbal Permission Granted  Share Information with NAME: [name]  Permission granted to share info w AGENCY: SNF's  Permission granted to share info w  Relationship: daughter  Permission granted to share info w Contact Information: [phone]  Emotional Assessment Appearance:: Appears stated age Attitude/Demeanor/Rapport: Engaged Affect (typically observed): Accepting Orientation: : Oriented to Self,Oriented to Place,Oriented to  Time,Oriented to Situation Alcohol / Substance Use: Not  Applicable Psych Involvement: No (comment)  Admission diagnosis:  Generalized weakness [R53.1] AKI (acute kidney injury) ([place]) [N17.9] Patient Active Problem List   Diagnosis Date Noted  . Leukocytosis   . Wide-complex tachycardia ([place])   . Thrush   . Weakness 12/16/2020  . Acute urinary retention 12/16/2020  . Dehydration 12/16/2020  . Acute kidney injury superimposed on CKD ([place]) 12/16/2020  . Acute cholecystitis 12/09/2020  . Cholecystitis 12/08/2020  . Basal cell carcinoma (BCC) of left ear 12/08/2020  . Hypotonicity of bladder 11/29/2018  . Acute lower UTI 08/11/2018  . UTI (urinary tract infection) 08/11/2018  . Incomplete bladder emptying 08/09/2017  . Benign prostatic hyperplasia 08/09/2017  . Stage 3 chronic kidney disease ([place]) 08/09/2017  . Pneumonia 01/10/2016  . Nocturia 01/28/2015  . Bundle branch block, left 06/11/2014  . Dizziness 06/11/2014  . Dyspnea 06/11/2014  . History of vertigo 06/11/2014  . Hx of asbestosis 06/11/2014  . Hyperlipidemia 06/11/2014  . Essential hypertension 06/11/2014  . Chronic obstructive pulmonary disease, unspecified ([place]) 03/06/2014  . Elevated prostate specific antigen (PSA) 05/13/2013   PCP:  Derinda Late, MD Pharmacy:   [facility], [place], [place] [address] [place] [address] Phone: [phone] Fax: [place], [place] - [address] [place] [address] Phone: [phone] Fax: [place], [place] [address] Phone: [phone] Fax: [phone]     Social Determinants of Health (SDOH) Interventions    Readmission Risk Interventions Readmission Risk Prevention Plan 12/18/2020  Transportation Screening Complete  PCP or Specialist Appt within 3-5 Days Complete  HRI or [place] Complete  Social Work Consult for [place]  Planning/Counseling Complete  Palliative Care Screening Not Applicable  Medication Review (RN Care Manager) Complete  Some recent data might be hidden

## 2020-12-18 NOTE — Evaluation (Signed)
Occupational Therapy Evaluation Patient Details Name: Lonnie [name]. MRN: [id number] DOB: 03/30/1927 Today's Date: 12/18/2020    History of Present Illness Patient is a 87 year old male who was brought into the ER by EMS for evaluation of weakness and abdominal pain.  Patient was recently discharged from hospital following treatment for acute cholecystitis.  He is status post cholecystostomy tube placement and culture yielded Enterococcus faecium.  Patient was discharged home on Augmentin.  Labs show worsening leukocytosis with a left shift and normal lactic acid level.  Imaging shows gas /fluid-filled distended stomach.  He will be admitted to the hospital for further evaluation. PMH: anemia, L BBB, CKD stage 3, COPD, CAD, GERD, hx of asbestosis, HLD, HTN, penumonia, PSA elevation   Clinical Impression   Patient presenting with decreased I in self care, balance, functional mobility/transfers, endurance, and safety awareness. Patient reports living alone and being independent at baseline PTA. Pt used SPC for mobility or use of power scooter to drive to "shop" next to his home. He has a small dog he cares for at home and reports an aide from the [place] comes 3x/wk to help "with things around the house". Patient currently functioning at min - mod A overall and fatigues quickly. Patient will benefit from acute OT to increase overall independence in the areas of ADLs, functional mobility, and safety awareness in order to safely discharge to next venue of care.    Follow Up Recommendations  SNF;Supervision/Assistance - 24 hour    Equipment Recommendations  Other (comment) (defer to next venue of care)       Precautions / Restrictions Precautions Precautions: Fall Precaution Comments: R side drain Restrictions Weight Bearing Restrictions: No      Mobility Bed Mobility Overal bed mobility: Needs Assistance Bed Mobility: Supine to Sit;Sit to Supine     Supine to sit: Min assist Sit to supine:  Mod assist   General bed mobility comments: Pt needing increased time and assistance secondary to fatigue    Transfers Overall transfer level: Needs assistance Equipment used: Rolling walker (2 wheeled);None Transfers: Sit to/from Stand Sit to Stand: Min assist;Mod assist         General transfer comment: mod A without use of RW and min A with RW and min cuing for hand technique    Balance Overall balance assessment: Needs assistance Sitting-balance support: Feet supported Sitting balance-Leahy Scale: Fair Sitting balance - Comments: close supervision sitting EOB   Standing balance support: Bilateral upper extremity supported;During functional activity Standing balance-Leahy Scale: Poor Standing balance comment: BUE support & min assist during gait                           ADL either performed or assessed with clinical judgement   ADL Overall ADL's : Needs assistance/impaired                                       General ADL Comments: Pt standing with mod A needing total A for hygiene after episode of incontinence     Vision Wears Glasses: At all times              Pertinent Vitals/Pain Pain Assessment: No/denies pain     Hand Dominance Right   Extremity/Trunk Assessment Upper Extremity Assessment Upper Extremity Assessment: Generalized weakness   Lower Extremity Assessment Lower Extremity Assessment: Generalized weakness  Cervical / Trunk Assessment Cervical / Trunk Assessment: Other exceptions Cervical / Trunk Exceptions: R percutaneous  cholecystostomy   Communication Communication Communication: No difficulties   Cognition Arousal/Alertness: Awake/alert Behavior During Therapy: WFL for tasks assessed/performed Overall Cognitive Status: Within Functional Limits for tasks assessed                                 General Comments: AxO x 4, very poor safety awareness with mobility while using [place] expects to be discharged to:: Private residence Living Arrangements: Alone Available Help at Discharge: Family;Neighbor;Available PRN/intermittently;Other (Comment) Type of Home: House Home Access: Ramped entrance     Home Layout: One level     Bathroom Shower/Tub: Occupational psychologist: Handicapped height     Home Equipment: Environmental consultant - 4 wheels;Cane - single point;Electric scooter   Additional Comments: Pt reports he went home & sat in his recliner most of the day after d/c following most recent hospital stay      Prior Functioning/Environment Level of Independence: Independent with assistive device(s)        Comments: Pt uses SPC in home, electric scooter for community ambulation. has meals on wheels, able to prepare own meals.        OT Problem List: Decreased strength;Impaired balance (sitting and/or standing);Decreased cognition;Decreased activity tolerance;Decreased knowledge of use of DME or AE;Decreased safety awareness      OT Treatment/Interventions: Self-care/ADL training;Therapeutic exercise;Energy conservation;DME and/or AE instruction;Therapeutic activities;Balance training;Patient/family education;Manual therapy    OT Goals(Current goals can be found in the care plan section) Acute Rehab OT Goals Patient Stated Goal: none stated OT Goal Formulation: With patient Time For Goal Achievement: 01/01/21 Potential to Achieve Goals: Good ADL Goals Pt Will Perform Grooming: with supervision;standing Pt Will Perform Lower Body Dressing: with supervision;sit to/from stand Pt Will Transfer to Toilet: with supervision;ambulating Pt Will Perform Toileting - Clothing Manipulation and hygiene: with supervision;sit to/from stand  OT Frequency: Min 2X/week   Barriers to D/C: Decreased caregiver support  lives alone          AM-PAC OT "6 Clicks" Daily Activity     Outcome Measure Help from another person eating meals?:  None Help from another person taking care of personal grooming?: A Little Help from another person toileting, which includes using toliet, bedpan, or urinal?: A Lot Help from another person bathing (including washing, rinsing, drying)?: A Lot Help from another person to put on and taking off regular upper body clothing?: A Little Help from another person to put on and taking off regular lower body clothing?: A Lot 6 Click Score: 16   End of Session Equipment Utilized During Treatment: Rolling walker Nurse Communication: Mobility status  Activity Tolerance: Patient tolerated treatment well Patient left: in bed;with call bell/phone within reach;with bed alarm set  OT Visit Diagnosis: Unsteadiness on feet (R26.81);Muscle weakness (generalized) (M62.81)                Time: 3976-7341 OT Time Calculation (min): 24 min Charges:  OT General Charges $OT Visit: 1 Visit OT Evaluation $OT Eval Low Complexity: 1 Low OT Treatments $Self Care/Home Management : 8-22 mins  Darleen Crocker, MS, OTR/L , CBIS ascom [phone]  12/18/20, 3:47 PM

## 2020-12-18 NOTE — Consult Note (Signed)
[facility] Consult Note:    Date of Admission:  12/16/2020           Reason for Consult:  Acute kidney injury   Referring Provider: Loletha Grayer, MD Primary Care Provider: Derinda Late, MD   History of Presenting Illness:  Lonnie Shy Climmie Buelow. is a 87 y.o. male  Patient presented to the ER from home by EMS for weakness.  He recently had a cholecystostomy drain placed He required In-N-Out cath and 700 cc of urine was obtained Foley catheter was then inserted He also has intra-abdominal infection/abscess with enterococcal PCM.  Treated with Unasyn.  He was evaluated by general surgeon-gallbladders was thought to be well decompressed.  No bowel obstruction or gaseous distention.  No surgical intervention recommended. Baseline creatinine appears to be 1.43/GFR 46 from December 08, 2020 Since then creatinine has been steadily increasing.  It has stabilized at 2.6 for the last 3 days  Patient did have IV contrast exposure for CT abdomen on March 8    Review of Systems: ROS  Gen: Denies any fevers or chills HEENT: No vision or hearing problems CV: No chest pain or shortness of breath Resp: + cough   GI: No nausea, vomiting or diarrhea.  No blood in the stool GU : takes meds for prostate.  No hematuria.  No previous history of kidney problems MS: Ambulatory at home.  Denies any acute joint pain or swelling Derm:   No complaints Psych: No complaints Heme: No complaints Neuro: No complaints Endocrine: No complaints  Past Medical History:  Diagnosis Date  . Anemia   . Basal cell carcinoma 11/13/2009   Right alar groove  . Basal cell carcinoma 04/28/2010   left preauricular, BCC with focal sclerosis. Exc. 06/04/2010  . Basal cell carcinoma 04/24/2013   right post auricular  . Basal cell carcinoma 10/26/2017   right inferior cheek above mandible, Superficial  . Basal cell carcinoma 03/22/2018   right post. base of neck  . Basal cell carcinoma 05/08/2018    post. neck, spinal, Superficial. EDC 05/08/2018  . Basal cell carcinoma 07/23/2018   left mid supraclavicular, Superficial and nodular pattern. EDC  . Basal cell carcinoma 07/23/2018   right superior chest parasternal. Superficial and nodular pattern. EDC  . Basal cell carcinoma 05/16/2019   left lateral neck. Superficial and nodular pattern. EDC  . Basal cell carcinoma 11/07/2019   left lateral forehead. Nodular. EDC  . Basal cell carcinoma 12/23/2019   Left ala nasi. BCC with sclerosis.  . Basal cell carcinoma 11/19/2020   L ear post aspect,excised 12/01/20  . Bundle branch block, left   . CKD (chronic kidney disease) stage 3, GFR 30-59 ml/min (HCC)   . COPD (chronic obstructive pulmonary disease) ([place])   . Coronary artery disease   . Family history of adverse reaction to anesthesia    DAUGHTER-N/V  . GERD (gastroesophageal reflux disease)   . History of asbestosis   . HLD (hyperlipidemia)   . Hypertension   . Pneumonia 2017  . PSA elevation     Social History   Tobacco Use  . Smoking status: Former Smoker    Packs/day: 2.00    Years: 50.00    Pack years: 100.00    Types: Cigarettes    Quit date: 09/24/1989    Years since quitting: 31.2  . Smokeless tobacco: Never Used  Vaping Use  . Vaping Use: Never used  Substance Use Topics  . Alcohol use: Yes  Alcohol/week: 0.0 standard drinks    Comment: OCC RUM AND COKE  . Drug use: No    Family History  Problem Relation Age of Onset  . Heart attack Mother   . Heart attack Father      OBJECTIVE: Blood pressure (!) 123/51, pulse 82, temperature 97.9 F (36.6 C), resp. rate (!) 22, height 6' (1.829 m), weight 83.4 kg, SpO2 100 %.  Physical Exam Physical Exam: General:  No acute distress, laying in the bed  HEENT  anicteric, moist oral mucous membrane  Pulm/lungs  normal breathing effort, mild basilar crackles  CVS/Heart  regular rhythm, no rub or gallop, + murmur  Abdomen:   Soft, nontender  Extremities:  No  peripheral edema  Neurologic:  Alert, oriented, able to follow commands  Skin:  No acute rashes  Foley in place   Lab Results Lab Results  Component Value Date   WBC 16.0 (H) 12/18/2020   HGB 8.9 (L) 12/18/2020   HCT 27.0 (L) 12/18/2020   MCV 99.6 12/18/2020   PLT 187 12/18/2020    Lab Results  Component Value Date   CREATININE 2.59 (H) 12/18/2020   BUN 58 (H) 12/18/2020   NA 138 12/18/2020   K 4.1 12/18/2020   CL 117 (H) 12/18/2020   CO2 14 (L) 12/18/2020    Lab Results  Component Value Date   ALT 20 12/18/2020   AST 20 12/18/2020   ALKPHOS 60 12/18/2020   BILITOT 0.7 12/18/2020     Microbiology: Recent Results (from the past 240 hour(s))  SARS CORONAVIRUS 2 (TAT 6-24 HRS) Nasopharyngeal Nasopharyngeal Swab     Status: None   Collection Time: 12/08/20  1:00 PM   Specimen: Nasopharyngeal Swab  Result Value Ref Range Status   SARS Coronavirus 2 NEGATIVE NEGATIVE Final    Comment: (NOTE) SARS-CoV-2 target nucleic acids are NOT DETECTED.  The SARS-CoV-2 RNA is generally detectable in upper and lower respiratory specimens during the acute phase of infection. Negative results do not preclude SARS-CoV-2 infection, do not rule out co-infections with other pathogens, and should not be used as the sole basis for treatment or other patient management decisions. Negative results must be combined with clinical observations, patient history, and epidemiological information. The expected result is Negative.  Fact Sheet for Patients: SugarRoll.be  Fact Sheet for Healthcare Providers: https://www.woods-mathews.com/  This test is not yet approved or cleared by the [place] FDA and  has been authorized for detection and/or diagnosis of SARS-CoV-2 by FDA under an Emergency Use Authorization (EUA). This EUA will remain  in effect (meaning this test can be used) for the duration of the COVID-19 declaration under Se ction 564(b)(1)  of the Act, 21 U.S.C. section 360bbb-3(b)(1), unless the authorization is terminated or revoked sooner.  Performed at [facility], [place] [address], [place], [place] [address]   Culture, blood (Routine X 2) w Reflex to ID Panel     Status: None   Collection Time: 12/08/20  1:00 PM   Specimen: BLOOD  Result Value Ref Range Status   Specimen Description BLOOD BLOOD LEFT WRIST  Final   Special Requests   Final    BOTTLES DRAWN AEROBIC AND ANAEROBIC Blood Culture adequate volume   Culture   Final    NO GROWTH 5 DAYS Performed at [facility], [address], [place], [place] [address]    Report Status 12/13/2020 FINAL  Final  Culture, blood (Routine X 2) w Reflex to ID Panel  Status: None   Collection Time: 12/08/20  1:12 PM   Specimen: BLOOD  Result Value Ref Range Status   Specimen Description BLOOD LEFT ANTECUBITAL  Final   Special Requests   Final    BOTTLES DRAWN AEROBIC AND ANAEROBIC Blood Culture results may not be optimal due to an excessive volume of blood received in culture bottles   Culture   Final    NO GROWTH 5 DAYS Performed at [facility], [place] [place], [place] [address]    Report Status 12/13/2020 FINAL  Final  MRSA PCR Screening     Status: None   Collection Time: 12/08/20 11:03 PM   Specimen: Nasopharyngeal  Result Value Ref Range Status   MRSA by PCR NEGATIVE NEGATIVE Final    Comment:        The GeneXpert MRSA Assay (FDA approved for NASAL specimens only), is one component of a comprehensive MRSA colonization surveillance program. It is not intended to diagnose MRSA infection nor to guide or monitor treatment for MRSA infections. Performed at [facility], [address] [place], [place] [address]   Aerobic/Anaerobic Culture (surgical/deep wound)     Status: None   Collection Time: 12/09/20 12:20 PM   Specimen: Abscess  Result Value Ref Range Status   Specimen Description   Final     ABSCESS Performed at [facility], [address] [place], [place] [address]    Special Requests POST IMAGE GUIDED CHOLE TUBE PLACEMENT  Final   Gram Stain NO WBC SEEN NO ORGANISMS SEEN   Final   Culture   Final    RARE ENTEROCOCCUS FAECIUM NO ANAEROBES ISOLATED Performed at [facility], [place] [address], [place], [place] [address]    Report Status 12/14/2020 FINAL  Final   Organism ID, Bacteria ENTEROCOCCUS FAECIUM  Final      Susceptibility   Enterococcus faecium - MIC*    AMPICILLIN <=2 SENSITIVE Sensitive     VANCOMYCIN <=0.5 SENSITIVE Sensitive     GENTAMICIN SYNERGY SENSITIVE Sensitive     * RARE ENTEROCOCCUS FAECIUM  Blood culture (routine x 2)     Status: None (Preliminary result)   Collection Time: 12/16/20  3:36 PM   Specimen: BLOOD  Result Value Ref Range Status   Specimen Description BLOOD LEFT ANTECUBITAL  Final   Special Requests   Final    BOTTLES DRAWN AEROBIC AND ANAEROBIC Blood Culture adequate volume   Culture   Final    NO GROWTH 2 DAYS Performed at [facility], [address] [place], [place] [address]    Report Status PENDING  Incomplete  Blood culture (routine x 2)     Status: None (Preliminary result)   Collection Time: 12/16/20  3:36 PM   Specimen: BLOOD  Result Value Ref Range Status   Specimen Description BLOOD BLOOD LEFT FOREARM  Final   Special Requests   Final    BOTTLES DRAWN AEROBIC AND ANAEROBIC Blood Culture adequate volume   Culture   Final    NO GROWTH 2 DAYS Performed at [facility], [place], [place] [address]    Report Status PENDING  Incomplete  SARS CORONAVIRUS 2 (TAT 6-24 HRS) Nasopharyngeal Nasopharyngeal Swab     Status: None   Collection Time: 12/17/20 10:23 AM   Specimen: Nasopharyngeal Swab  Result Value Ref Range Status   SARS Coronavirus 2 NEGATIVE NEGATIVE Final    Comment: (NOTE) SARS-CoV-2 target nucleic acids are NOT DETECTED.  The SARS-CoV-2 RNA is generally  detectable in upper and lower respiratory specimens during the acute phase of infection. Negative results do not preclude SARS-CoV-2 infection, do not rule out co-infections with other pathogens, and should not be used as the sole basis for treatment or other patient management decisions. Negative results must be combined with clinical observations, patient history, and epidemiological information. The expected result is Negative.  Fact Sheet for Patients: SugarRoll.be  Fact Sheet for Healthcare Providers: https://www.woods-mathews.com/  This test is not yet approved or cleared by the [place] FDA and  has been authorized for detection and/or diagnosis of SARS-CoV-2 by FDA under an Emergency Use Authorization (EUA). This EUA will remain  in effect (meaning this test can be used) for the duration of the COVID-19 declaration under Se ction 564(b)(1) of the Act, 21 U.S.C. section 360bbb-3(b)(1), unless the authorization is terminated or revoked sooner.  Performed at [facility], [place] [address] [place], [place] [address]     Medications: Scheduled Meds: . alfuzosin  10 mg Oral Q breakfast  . [START ON 12/19/2020] allopurinol  50 mg Oral q morning  . vitamin C  250 mg Oral BID  . aspirin EC  81 mg Oral Daily  . atorvastatin  40 mg Oral q morning  . Chlorhexidine Gluconate Cloth  6 each Topical Daily  . enoxaparin (LOVENOX) injection  30 mg Subcutaneous Q24H  . ferrous sulfate  325 mg Oral BID WC  . finasteride  5 mg Oral BH-q7a  . gabapentin  100 mg Oral BID  . Ipratropium-Albuterol  1 puff Inhalation Daily  . magnesium oxide  400 mg Oral q morning  . melatonin  5 mg Oral QHS  . mometasone-formoterol  2 puff Inhalation BID  . NIFEdipine  60 mg Oral q morning  . nystatin   Topical TID  . omega-3 acid ethyl esters  1 g Oral Daily  . pantoprazole  40 mg Oral Daily  . senna-docusate  2 tablet Oral Daily  . vitamin B-12  1,000  mcg Oral Daily   Continuous Infusions: . sodium chloride 40 mL/hr at 12/18/20 0821  . ampicillin-sulbactam (UNASYN) IV Stopped (12/18/20 0536)  . fluconazole (DIFLUCAN) IV 50 mg (12/18/20 1249)   PRN Meds:.bisacodyl, morphine injection, ondansetron **OR** ondansetron (ZOFRAN) IV  Allergies  Allergen Reactions  . Sulfa Antibiotics Other (See Comments)    Not sure of reaction. It was too long ago, but he said not to give it to him.   . Sulfasalazine     Other reaction(s): Other (See Comments) Not sure of reaction. It was too long ago, but he said not to give it to him.   [place] Kitchen Penicillin G Rash    Tolerates ampicillin and amoxicillin    Urinalysis: Recent Labs    12/16/20 1535  COLORURINE YELLOW  LABSPEC 1.020  PHURINE 5.0  GLUCOSEU NEGATIVE  HGBUR NEGATIVE  BILIRUBINUR NEGATIVE  KETONESUR TRACE*  PROTEINUR 30*  NITRITE NEGATIVE  LEUKOCYTESUR TRACE*      Imaging: No results found.    Assessment/Plan:  Lonnie Dolinsky. is a 87 y.o. male with medical problems of      was admitted on 12/16/2020 for :  Generalized weakness [R53.1] AKI (acute kidney injury) ([place]) [N17.9]  #Acute kidney injury Likely multifactorial Urinalysis March 16: Protein 30 mg/dL, trace ketone, negative blood Imaging: Noncontrast CT abdomen December 16, 2020: No evidence of bowel obstruction, cholecystectomy tube in place, resolving pneumonia, aortic atherosclerosis.  Bilateral renal vascular  calcifications or renal scarring.  No hydronephrosis. Patient did have urinary retention upon admission and required Foley catheter placement  IV contrast exposure December 08, 2020 for CT abdomen and pelvis  Lab Results  Component Value Date   CREATININE 2.59 (H) 12/18/2020   CREATININE 2.62 (H) 12/17/2020   CREATININE 2.60 (H) 12/16/2020     AKI is likely multifactorial from urinary retention, IV contrast exposure on March 8 and concurrent illness likely causing ATN -Continue supportive care -Avoid  hypotension -Avoid nephrotoxins such as nonsteroidals and IV contrast  We will follow  Lonnie [name] 12/18/20

## 2020-12-18 NOTE — NC FL2 (Signed)
[place] LEVEL OF CARE SCREENING TOOL     IDENTIFICATION  Patient Name: Lonnie [name]. Birthdate: 12-15-1926 Sex: male Admission Date (Current Location): 12/16/2020  [place] and [place] Number:  Engineering geologist and Address:  [facility], [address] [place], [place] [address]      Provider Number: [id number]  Attending Physician Name and Address:  Loletha Grayer, MD  Relative Name and Phone Number:       Current Level of Care: Hospital Recommended Level of Care: [place] Prior Approval Number:    Date Approved/Denied:   PASRR Number: [id number]  Discharge Plan: SNF    Current Diagnoses: Patient Active Problem List   Diagnosis Date Noted  . Leukocytosis   . Wide-complex tachycardia ([place])   . Thrush   . Weakness 12/16/2020  . Acute urinary retention 12/16/2020  . Dehydration 12/16/2020  . Acute kidney injury superimposed on CKD ([place]) 12/16/2020  . Acute cholecystitis 12/09/2020  . Cholecystitis 12/08/2020  . Basal cell carcinoma (BCC) of left ear 12/08/2020  . Hypotonicity of bladder 11/29/2018  . Acute lower UTI 08/11/2018  . UTI (urinary tract infection) 08/11/2018  . Incomplete bladder emptying 08/09/2017  . Benign prostatic hyperplasia 08/09/2017  . Stage 3 chronic kidney disease ([place]) 08/09/2017  . Pneumonia 01/10/2016  . Nocturia 01/28/2015  . Bundle branch block, left 06/11/2014  . Dizziness 06/11/2014  . Dyspnea 06/11/2014  . History of vertigo 06/11/2014  . Hx of asbestosis 06/11/2014  . Hyperlipidemia 06/11/2014  . Essential hypertension 06/11/2014  . Chronic obstructive pulmonary disease, unspecified ([place]) 03/06/2014  . 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 Non-pressure wound on left posterior ear (gauze BID).)                       Personal Care Assistance Level of Assistance  Bathing,Feeding,Dressing Bathing Assistance: Limited assistance Feeding assistance: Limited assistance Dressing Assistance: Limited assistance     Functional Limitations Info  Sight,Hearing,Speech Sight Info: Adequate Hearing Info: Adequate Speech Info: Adequate    SPECIAL CARE FACTORS FREQUENCY  PT (By licensed PT),OT (By licensed OT)     PT Frequency: 5 x week OT Frequency: 5 x week            Contractures Contractures Info: Not present    Additional Factors Info  Code Status,Allergies Code Status Info: Full code Allergies Info: Sulfa Antibiotics, Sulfasalazine, Penicillin G           Current Medications (12/18/2020):  This is the current hospital active medication list Current Facility-Administered Medications  Medication Dose Route Frequency Provider Last Rate Last Admin  . 0.9 %  sodium chloride infusion   Intravenous Continuous Loletha Grayer, MD 40 mL/hr at 12/18/20 0821 Rate Change at 12/18/20 0821  . alfuzosin (UROXATRAL) 24 hr tablet 10 mg  10 mg Oral Q breakfast Agbata, Tochukwu, MD   10 mg at 12/18/20 0827  . [START ON 12/19/2020] allopurinol (ZYLOPRIM) tablet 50 mg  50 mg Oral q morning Loletha Grayer, MD      . Ampicillin-Sulbactam Loralyn [place])  3 g in sodium chloride 0.9 % 100 mL IVPB  3 g Intravenous Q12H Oswald Hillock, RPH 200 mL/hr at 12/18/20 0506 3 g at 12/18/20 0506  . ascorbic acid (VITAMIN C) tablet 250 mg  250 mg Oral BID Agbata, Tochukwu, MD   250 mg at 12/18/20 0824  . aspirin EC tablet 81 mg  81 mg Oral Daily Agbata, Tochukwu, MD   81 mg at 12/18/20 9323  . atorvastatin (LIPITOR) tablet 40 mg   40 mg Oral q morning Agbata, Tochukwu, MD   40 mg at 12/18/20 0833  . bisacodyl (DULCOLAX) EC tablet 10 mg  10 mg Oral BID PRN Agbata, Tochukwu, MD      . Chlorhexidine Gluconate Cloth 2 % PADS 6 each  6 each Topical Daily Loletha Grayer, MD   6 each at 12/18/20 0831  . enoxaparin (LOVENOX) injection 30 mg  30 mg Subcutaneous Q24H Agbata, Tochukwu, MD   30 mg at 12/17/20 2216  . ferrous sulfate tablet 325 mg  325 mg Oral BID WC Agbata, Tochukwu, MD   325 mg at 12/18/20 5573  . finasteride (PROSCAR) tablet 5 mg  5 mg Oral Butch Penny, Tochukwu, MD   5 mg at 12/18/20 0823  . fluconazole (DIFLUCAN) IVPB 50 mg  50 mg Intravenous Q24H Dorothe Pea, RPH      . gabapentin (NEURONTIN) capsule 100 mg  100 mg Oral BID Wieting, Richard, MD      . Ipratropium-Albuterol (COMBIVENT) respimat 1 puff  1 puff Inhalation Daily Agbata, Tochukwu, MD   1 puff at 12/18/20 1031  . magnesium oxide (MAG-OX) tablet 400 mg  400 mg Oral q morning Agbata, Tochukwu, MD   400 mg at 12/18/20 0833  . melatonin tablet 5 mg  5 mg Oral QHS Agbata, Tochukwu, MD   5 mg at 12/17/20 2213  . mometasone-formoterol (DULERA) 200-5 MCG/ACT inhaler 2 puff  2 puff Inhalation BID Agbata, Tochukwu, MD   2 puff at 12/18/20 0833  . morphine 2 MG/ML injection 1 mg  1 mg Intravenous Q4H PRN Agbata, Tochukwu, MD      . NIFEdipine (ADALAT CC) 24 hr tablet 60 mg  60 mg Oral q morning Agbata, Tochukwu, MD   60 mg at 12/18/20 0828  . nystatin (MYCOSTATIN/NYSTOP) topical powder   Topical TID Collier Bullock, MD   Given at 12/18/20 1032  . omega-3 acid ethyl esters (LOVAZA) capsule 1 g  1 g Oral Daily Dorothe Pea, RPH   1 g at 12/18/20 0830  . ondansetron (ZOFRAN) tablet 4 mg  4 mg Oral Q6H PRN Agbata, Tochukwu, MD       Or  . ondansetron (ZOFRAN) injection 4 mg  4 mg Intravenous Q6H PRN Agbata, Tochukwu, MD      . pantoprazole (PROTONIX) EC tablet 40 mg  40 mg Oral Daily Agbata, Tochukwu, MD   40 mg at 12/18/20 0829  . senna-docusate  (Senokot-S) tablet 2 tablet  2 tablet Oral Daily Agbata, Tochukwu, MD   2 tablet at 12/18/20 0823  . vitamin B-12 (CYANOCOBALAMIN) tablet 1,000 mcg  1,000 mcg Oral Daily Agbata, Tochukwu, MD   1,000 mcg at 12/18/20 2202     Discharge Medications: Please see discharge summary for a list of discharge medications.  Relevant Imaging Results:  Relevant Lab Results:   Additional Information SS#: [id number]. Has a biliary drain to RUQ.  Candie Chroman, LCSW

## 2020-12-19 ENCOUNTER — Inpatient Hospital Stay
Admit: 2020-12-19 | Discharge: 2020-12-19 | Disposition: A | Discharge status: Home / Self Care | Payer: No Typology Code available for payment source | Attending: Internal Medicine | Admitting: Internal Medicine

## 2020-12-19 DIAGNOSIS — E785 Hyperlipidemia, unspecified: Secondary | ICD-10-CM

## 2020-12-19 DIAGNOSIS — K810 Acute cholecystitis: Secondary | ICD-10-CM | POA: Diagnosis not present

## 2020-12-19 DIAGNOSIS — N179 Acute kidney failure, unspecified: Secondary | ICD-10-CM | POA: Diagnosis not present

## 2020-12-19 DIAGNOSIS — B370 Candidal stomatitis: Secondary | ICD-10-CM | POA: Diagnosis not present

## 2020-12-19 DIAGNOSIS — I472 Ventricular tachycardia: Secondary | ICD-10-CM | POA: Diagnosis not present

## 2020-12-19 LAB — CBC
HCT: 26.4 % — ABNORMAL LOW (ref 39.0–52.0)
Hemoglobin: 9.1 g/dL — ABNORMAL LOW (ref 13.0–17.0)
MCH: 33.8 pg (ref 26.0–34.0)
MCHC: 34.5 g/dL (ref 30.0–36.0)
MCV: 98.1 fL (ref 80.0–100.0)
Platelets: 212 10*3/uL (ref 150–400)
RBC: 2.69 MIL/uL — ABNORMAL LOW (ref 4.22–5.81)
RDW: 13.9 % (ref 11.5–15.5)
WBC: 14.8 10*3/uL — ABNORMAL HIGH (ref 4.0–10.5)
nRBC: 0 % (ref 0.0–0.2)

## 2020-12-19 LAB — BASIC METABOLIC PANEL
Anion gap: 6 (ref 5–15)
BUN: 54 mg/dL — ABNORMAL HIGH (ref 8–23)
CO2: 14 mmol/L — ABNORMAL LOW (ref 22–32)
Calcium: 8.3 mg/dL — ABNORMAL LOW (ref 8.9–10.3)
Chloride: 117 mmol/L — ABNORMAL HIGH (ref 98–111)
Creatinine, Ser: 2.15 mg/dL — ABNORMAL HIGH (ref 0.61–1.24)
GFR, Estimated: 28 mL/min — ABNORMAL LOW (ref >60)
Glucose, Bld: 111 mg/dL — ABNORMAL HIGH (ref 70–99)
Potassium: 4 mmol/L (ref 3.5–5.1)
Sodium: 137 mmol/L (ref 135–145)

## 2020-12-19 LAB — LIPASE, BLOOD: Lipase: 81 U/L — ABNORMAL HIGH (ref 11–51)

## 2020-12-19 NOTE — Progress Notes (Addendum)
[facility]  ROUNDING NOTE   Subjective:   Patient found resting in bed today, in no acute distress.His kidney function is improving gradually. He is making adequate urine output.  Objective:  Vital signs in last 24 hours:  Temp:  [97.7 F (36.5 C)-98.7 F (37.1 C)] 98.1 F (36.7 C) (03/19 1123) Pulse Rate:  [79-84] 79 (03/19 1123) Resp:  [16-18] 18 (03/19 1123) BP: (129-136)/(50-54) 129/50 (03/19 1123) SpO2:  [94 %-96 %] 96 % (03/19 1123)  Weight change:  Filed Weights   12/16/20 1116 12/17/20 1206  Weight: 83 kg 83.4 kg    Intake/Output: I/O last 3 completed shifts: In: 2532.3 [P.O.:960; I.V.:1329.2; Other:20; IV Piggyback:223.1] Out: 2190 [Urine:1650; Drains:540]   Intake/Output this shift:  Total I/O In: 240 [P.O.:240] Out: 250 [Drains:250]  Physical Exam: General: Resting in bed, in no acute distress  Head: Normocephalic, atraumatic. Moist oral mucosal membranes  Lungs:  Crackles bilaterally,normal effort  Heart: Regular rate and rhythm,+murmur  Abdomen:  Soft, nontender, non distended  Extremities:  No peripheral edema.  Neurologic: Able to answer simple questions,  moving all four extremities  Skin: No acute  Lesions or rashes    Basic Metabolic Panel: Recent Labs  Lab 12/16/20 1119 12/16/20 2030 12/17/20 0504 12/18/20 0732 12/19/20 0723  NA 138 136 138 138 137  K 4.1 4.3 3.9 4.1 4.0  CL 113* 113* 116* 117* 117*  CO2 15* 16* 14* 14* 14*  GLUCOSE 170* 109* 99 101* 111*  BUN 56* 57* 58* 58* 54*  CREATININE 2.51* 2.60* 2.62* 2.59* 2.15*  CALCIUM 8.4* 8.1* 8.0* 8.2* 8.3*  MG  --  3.0*  --   --   --     Liver Function Tests: Recent Labs  Lab 12/16/20 1119 12/18/20 0732  AST 26 20  ALT 22 20  ALKPHOS 62 60  BILITOT 0.6 0.7  PROT 6.3* 5.5*  ALBUMIN 3.1* 2.5*   Recent Labs  Lab 12/16/20 1119 12/18/20 0732 12/19/20 0723  LIPASE 89* 74* 81*   No results for input(s): AMMONIA in the last 168 hours.  CBC: Recent Labs  Lab  12/16/20 1119 12/17/20 0504 12/18/20 0732 12/19/20 0723  WBC 16.7* 19.0* 16.0* 14.8*  HGB 10.1* 9.4* 8.9* 9.1*  HCT 30.9* 28.2* 27.0* 26.4*  MCV 100.7* 100.7* 99.6 98.1  PLT 210 195 187 212    Cardiac Enzymes: No results for input(s): CKTOTAL, CKMB, CKMBINDEX, TROPONINI in the last 168 hours.  BNP: Invalid input(s): POCBNP  CBG: No results for input(s): GLUCAP in the last 168 hours.  Microbiology: Results for orders placed or performed during the hospital encounter of 12/16/20  Blood culture (routine x 2)     Status: None (Preliminary result)   Collection Time: 12/16/20  3:36 PM   Specimen: BLOOD  Result Value Ref Range Status   Specimen Description BLOOD LEFT ANTECUBITAL  Final   Special Requests   Final    BOTTLES DRAWN AEROBIC AND ANAEROBIC Blood Culture adequate volume   Culture   Final    NO GROWTH 3 DAYS Performed at [facility], [address], [place], [place] [address]    Report Status PENDING  Incomplete  Blood culture (routine x 2)     Status: None (Preliminary result)   Collection Time: 12/16/20  3:36 PM   Specimen: BLOOD  Result Value Ref Range Status   Specimen Description BLOOD BLOOD LEFT FOREARM  Final   Special Requests   Final    BOTTLES DRAWN AEROBIC  AND ANAEROBIC Blood Culture adequate volume   Culture   Final    NO GROWTH 3 DAYS Performed at [facility] [place], [place] [address]    Report Status PENDING  Incomplete  SARS CORONAVIRUS 2 (TAT 6-24 HRS) Nasopharyngeal Nasopharyngeal Swab     Status: None   Collection Time: 12/17/20 10:23 AM   Specimen: Nasopharyngeal Swab  Result Value Ref Range Status   SARS Coronavirus 2 NEGATIVE NEGATIVE Final    Comment: (NOTE) SARS-CoV-2 target nucleic acids are NOT DETECTED.  The SARS-CoV-2 RNA is generally detectable in upper and lower respiratory specimens during the acute phase of infection. Negative results do not preclude SARS-CoV-2 infection, do not rule  out co-infections with other pathogens, and should not be used as the sole basis for treatment or other patient management decisions. Negative results must be combined with clinical observations, patient history, and epidemiological information. The expected result is Negative.  Fact Sheet for Patients: SugarRoll.be  Fact Sheet for Healthcare Providers: https://www.woods-mathews.com/  This test is not yet approved or cleared by the [place] FDA and  has been authorized for detection and/or diagnosis of SARS-CoV-2 by FDA under an Emergency Use Authorization (EUA). This EUA will remain  in effect (meaning this test can be used) for the duration of the COVID-19 declaration under Se ction 564(b)(1) of the Act, 21 U.S.C. section 360bbb-3(b)(1), unless the authorization is terminated or revoked sooner.  Performed at [facility], [place] [address] [place], [place] [address]     Coagulation Studies: No results for input(s): LABPROT, INR in the last 72 hours.  Urinalysis: Recent Labs    12/16/20 1535  COLORURINE YELLOW  LABSPEC 1.020  PHURINE 5.0  GLUCOSEU NEGATIVE  HGBUR NEGATIVE  BILIRUBINUR NEGATIVE  KETONESUR TRACE*  PROTEINUR 30*  NITRITE NEGATIVE  LEUKOCYTESUR TRACE*      Imaging: No results found.   Medications:   . sodium chloride 40 mL/hr at 12/19/20 0424  . ampicillin-sulbactam (UNASYN) IV 3 g (12/19/20 0556)  . fluconazole (DIFLUCAN) IV Stopped (12/18/20 1349)   . alfuzosin  10 mg Oral Q breakfast  . allopurinol  50 mg Oral q morning  . vitamin C  250 mg Oral BID  . aspirin EC  81 mg Oral Daily  . atorvastatin  40 mg Oral q morning  . Chlorhexidine Gluconate Cloth  6 each Topical Daily  . enoxaparin (LOVENOX) injection  30 mg Subcutaneous Q24H  . feeding supplement  237 mL Oral BID BM  . ferrous sulfate  325 mg Oral BID WC  . finasteride  5 mg Oral BH-q7a  . gabapentin  100 mg Oral BID  .  Ipratropium-Albuterol  2 puff Inhalation QID  . magnesium oxide  400 mg Oral q morning  . melatonin  5 mg Oral QHS  . mometasone-formoterol  2 puff Inhalation BID  . NIFEdipine  60 mg Oral q morning  . nystatin   Topical TID  . omega-3 acid ethyl esters  1 g Oral Daily  . pantoprazole  40 mg Oral Daily  . senna-docusate  2 tablet Oral Daily  . vitamin B-12  1,000 mcg Oral Daily   bisacodyl, morphine injection, ondansetron **OR** ondansetron (ZOFRAN) IV  Assessment/ Plan:  Mr. Lonnie [name]. is a 87 y.o.  male   #Acute Kidney Injury  Acute kidney injury secondary to urinary retention, IV contrast exposure on March 8 and concurrent illness likely causing ATN Lab Results  Component Value  Date   CREATININE 2.15 (H) 12/19/2020   CREATININE 2.59 (H) 12/18/2020   CREATININE 2.62 (H) 12/17/2020  `No acute indication for Dialysis. UOP for the preceding 24 hours is adequate amount Continue supportive care, avoiding hypotension  Avoid nephrotoxins    Intake/Output Summary (Last 24 hours) at 12/19/2020 1230 Last data filed at 12/19/2020 1029 Gross per 24 hour  Intake 1633.02 ml  Output 1900 ml  Net -266.98 ml   # Acute Urinary retention  I&O catheter on admission with 700 ml of urine output Required F/C placement CT Abdomen 12/16/20-No hydronephrosis   I saw and evaluated the patient and discussed the care with Crosby Oyster, DNP.  I agree with the findings and plan as documented in the note.    Zaraya Delauder Holley Raring , [facility] 3/19/20225:13 PM     LOS: 3 Crosby Oyster 3/19/202212:30 PM

## 2020-12-19 NOTE — Progress Notes (Signed)
Patient ID: Lonnie Sermons., male   DOB: September 27, 1927, 87 y.o.   MRN: [id number] [facility] PROGRESS NOTE  Lonnie [name]. HKV:[id number] DOB: 1926/12/26 DOA: 12/16/2020 PCP: Derinda Late, MD  HPI/Subjective: Patient feeling a little bit better.  Offers no complaints.  Still not having a great appetite.  No abdominal pain, diarrhea or constipation.  Objective: Vitals:   12/19/20 1123 12/19/20 1533  BP: (!) 129/50 (!) 147/56  Pulse: 79 82  Resp: 18 16  Temp: 98.1 F (36.7 C) 97.9 F (36.6 C)  SpO2: 96% 99%    Intake/Output Summary (Last 24 hours) at 12/19/2020 1541 Last data filed at 12/19/2020 1029 Gross per 24 hour  Intake 1393.02 ml  Output 1700 ml  Net -306.98 ml   Filed Weights   12/16/20 1116 12/17/20 1206  Weight: 83 kg 83.4 kg    ROS: Review of Systems  Respiratory: Negative for shortness of breath.   Cardiovascular: Negative for chest pain.  Gastrointestinal: Negative for abdominal pain, nausea and vomiting.   Exam: Physical Exam HENT:     Head: Normocephalic.     Mouth/Throat:     Pharynx: No oropharyngeal exudate.  Eyes:     General: Lids are normal.     Conjunctiva/sclera: Conjunctivae normal.  Cardiovascular:     Rate and Rhythm: Normal rate and regular rhythm.     Heart sounds: Normal heart sounds, S1 normal and S2 normal.  Pulmonary:     Breath sounds: Normal breath sounds. No decreased breath sounds, wheezing, rhonchi or rales.  Abdominal:     Palpations: Abdomen is soft.     Tenderness: There is no abdominal tenderness.  Musculoskeletal:     Right lower leg: Swelling present.     Left lower leg: Swelling present.  Skin:    General: Skin is warm.     Findings: No rash.  Neurological:     Mental Status: He is alert and oriented to person, place, and time.       Data Reviewed: Basic Metabolic Panel: Recent Labs  Lab 12/16/20 1119 12/16/20 2030 12/17/20 0504 12/18/20 0732 12/19/20 0723  NA 138 136 138 138 137  K 4.1 4.3  3.9 4.1 4.0  CL 113* 113* 116* 117* 117*  CO2 15* 16* 14* 14* 14*  GLUCOSE 170* 109* 99 101* 111*  BUN 56* 57* 58* 58* 54*  CREATININE 2.51* 2.60* 2.62* 2.59* 2.15*  CALCIUM 8.4* 8.1* 8.0* 8.2* 8.3*  MG  --  3.0*  --   --   --    Liver Function Tests: Recent Labs  Lab 12/16/20 1119 12/18/20 0732  AST 26 20  ALT 22 20  ALKPHOS 62 60  BILITOT 0.6 0.7  PROT 6.3* 5.5*  ALBUMIN 3.1* 2.5*   Recent Labs  Lab 12/16/20 1119 12/18/20 0732 12/19/20 0723  LIPASE 89* 74* 81*   CBC: Recent Labs  Lab 12/16/20 1119 12/17/20 0504 12/18/20 0732 12/19/20 0723  WBC 16.7* 19.0* 16.0* 14.8*  HGB 10.1* 9.4* 8.9* 9.1*  HCT 30.9* 28.2* 27.0* 26.4*  MCV 100.7* 100.7* 99.6 98.1  PLT 210 195 187 212     Recent Results (from the past 240 hour(s))  Blood culture (routine x 2)     Status: None (Preliminary result)   Collection Time: 12/16/20  3:36 PM   Specimen: BLOOD  Result Value Ref Range Status   Specimen Description BLOOD LEFT ANTECUBITAL  Final   Special Requests   Final    BOTTLES DRAWN  AEROBIC AND ANAEROBIC Blood Culture adequate volume   Culture   Final    NO GROWTH 3 DAYS Performed at [facility], [address] [place], [place] [address]    Report Status PENDING  Incomplete  Blood culture (routine x 2)     Status: None (Preliminary result)   Collection Time: 12/16/20  3:36 PM   Specimen: BLOOD  Result Value Ref Range Status   Specimen Description BLOOD BLOOD LEFT FOREARM  Final   Special Requests   Final    BOTTLES DRAWN AEROBIC AND ANAEROBIC Blood Culture adequate volume   Culture   Final    NO GROWTH 3 DAYS Performed at [facility], [address], [place], [place] [address]    Report Status PENDING  Incomplete  SARS CORONAVIRUS 2 (TAT 6-24 HRS) Nasopharyngeal Nasopharyngeal Swab     Status: None   Collection Time: 12/17/20 10:23 AM   Specimen: Nasopharyngeal Swab  Result Value Ref Range Status   SARS Coronavirus 2 NEGATIVE NEGATIVE Final     Comment: (NOTE) SARS-CoV-2 target nucleic acids are NOT DETECTED.  The SARS-CoV-2 RNA is generally detectable in upper and lower respiratory specimens during the acute phase of infection. Negative results do not preclude SARS-CoV-2 infection, do not rule out co-infections with other pathogens, and should not be used as the sole basis for treatment or other patient management decisions. Negative results must be combined with clinical observations, patient history, and epidemiological information. The expected result is Negative.  Fact Sheet for Patients: SugarRoll.be  Fact Sheet for Healthcare Providers: https://www.woods-mathews.com/  This test is not yet approved or cleared by the [place] FDA and  has been authorized for detection and/or diagnosis of SARS-CoV-2 by FDA under an Emergency Use Authorization (EUA). This EUA will remain  in effect (meaning this test can be used) for the duration of the COVID-19 declaration under Se ction 564(b)(1) of the Act, 21 U.S.C. section 360bbb-3(b)(1), unless the authorization is terminated or revoked sooner.  Performed at [facility], [place] [address] [place], [place] [address]      Scheduled Meds: . alfuzosin  10 mg Oral Q breakfast  . allopurinol  50 mg Oral q morning  . vitamin C  250 mg Oral BID  . aspirin EC  81 mg Oral Daily  . atorvastatin  40 mg Oral q morning  . Chlorhexidine Gluconate Cloth  6 each Topical Daily  . enoxaparin (LOVENOX) injection  30 mg Subcutaneous Q24H  . feeding supplement  237 mL Oral BID BM  . ferrous sulfate  325 mg Oral BID WC  . finasteride  5 mg Oral BH-q7a  . gabapentin  100 mg Oral BID  . Ipratropium-Albuterol  2 puff Inhalation QID  . magnesium oxide  400 mg Oral q morning  . melatonin  5 mg Oral QHS  . mometasone-formoterol  2 puff Inhalation BID  . NIFEdipine  60 mg Oral q morning  . nystatin   Topical TID  . omega-3 acid ethyl esters   1 g Oral Daily  . pantoprazole  40 mg Oral Daily  . senna-docusate  2 tablet Oral Daily  . vitamin B-12  1,000 mcg Oral Daily   Continuous Infusions: . sodium chloride 40 mL/hr at 12/19/20 0424  . ampicillin-sulbactam (UNASYN) IV 3 g (12/19/20 0556)  . fluconazole (DIFLUCAN) IV 50 mg (12/19/20 1259)    Assessment/Plan:  1. Acute kidney injury on chronic kidney disease stage IIIa.  Creatinine finally starting to  improve down to 2.15 today.  Was as high as 2.62 on 12/17/2020.  Baseline creatinine around 1.64. 2. Acute cholecystitis status post percutaneous gallbladder drain.  Patient had leukocytosis and previous pneumonia.  Continue IV Unasyn.  Previous gallbladder culture growing Enterococcus.  Pancreas enzymes still elevated.  Patient having no abdominal pain and CAT scan commented that the pancreas was normal. 3. Wide-complex tachycardia on presentation received 1 dose of amiodarone.  Currently on Adalat CC.  Echocardiogram done but not read yet. 4. Previous pneumonia.  Continue inhalers and chest PT 5. Thrush on IV Diflucan 6. BPH on alfuzosin and Proscar 7. Essential hypertension on nifedipine 8. Hyperlipidemia unspecified on atorvastatin 9. Weakness.  Physical therapy recommend rehab     Code Status:     Code Status Orders  (From admission, onward)         Start     Ordered   12/16/20 1709  Full code  Continuous        12/16/20 1710        Code Status History    Date Active Date Inactive Code Status Order ID Comments User Context   12/08/2020 1621 12/14/2020 2348 Full Code [id number]  CoxBriant Cedar, DO ED   08/11/2018 2104 08/15/2018 1748 Full Code [id number]  Vaughan Basta, MD Inpatient   01/10/2016 2016 01/18/2016 1943 Full Code [id number]  Henreitta Leber, MD Inpatient   Advance Care Planning Activity    Advance Directive Documentation   Flowsheet Row Most Recent Value  Type of Advance Directive Healthcare Power of Attorney, Living will  Pre-existing out of  facility DNR order (yellow form or pink MOST form) --  "MOST" Form in Place? --     Family Communication: Left message for daughter Disposition Plan: Status is: Inpatient  Dispo: The patient is from: Home              Anticipated d/c is to: Rehab              Patient currently on iv antibiotics and fluids   Difficult to place patient. Hopefully not  Time spent: 27 minutes  [place]

## 2020-12-19 NOTE — Progress Notes (Signed)
Patient s daughter stated to NT about mislocation of right hearing aid. This RN went to pt's  room and she stated about mislocation of right hearing aid . Writer looked everywhere under pt, under pt's bed, in drawers but could not locate right hearing aid. Notified charge nurse about incident. Daughter stated she had left hearing aid in her purse and she gave it to his father.   So pt's has his belonging left hearing aid, glassess, mobile ph .

## 2020-12-19 NOTE — Progress Notes (Signed)
*  PRELIMINARY RESULTS* Echocardiogram 2D Echocardiogram has been performed.  Lavell Luster Zeev Deakins 12/19/2020, 2:47 PM

## 2020-12-20 ENCOUNTER — Inpatient Hospital Stay: Payer: No Typology Code available for payment source

## 2020-12-20 DIAGNOSIS — R109 Unspecified abdominal pain: Secondary | ICD-10-CM

## 2020-12-20 DIAGNOSIS — R112 Nausea with vomiting, unspecified: Secondary | ICD-10-CM

## 2020-12-20 DIAGNOSIS — N179 Acute kidney failure, unspecified: Secondary | ICD-10-CM | POA: Diagnosis not present

## 2020-12-20 DIAGNOSIS — R1110 Vomiting, unspecified: Secondary | ICD-10-CM

## 2020-12-20 DIAGNOSIS — K810 Acute cholecystitis: Secondary | ICD-10-CM | POA: Diagnosis not present

## 2020-12-20 LAB — BASIC METABOLIC PANEL
Anion gap: 7 (ref 5–15)
BUN: 54 mg/dL — ABNORMAL HIGH (ref 8–23)
CO2: 15 mmol/L — ABNORMAL LOW (ref 22–32)
Calcium: 8.6 mg/dL — ABNORMAL LOW (ref 8.9–10.3)
Chloride: 118 mmol/L — ABNORMAL HIGH (ref 98–111)
Creatinine, Ser: 2.01 mg/dL — ABNORMAL HIGH (ref 0.61–1.24)
GFR, Estimated: 31 mL/min — ABNORMAL LOW (ref >60)
Glucose, Bld: 120 mg/dL — ABNORMAL HIGH (ref 70–99)
Potassium: 4.6 mmol/L (ref 3.5–5.1)
Sodium: 140 mmol/L (ref 135–145)

## 2020-12-20 LAB — ECHOCARDIOGRAM COMPLETE
AR max vel: 2.26 cm2
AV Peak grad: 16.3 mmHg
Ao pk vel: 2.02 m/s
Area-P 1/2: 2.76 cm2
Height: 72 in
S' Lateral: 2.21 cm
Weight: 2941.82 oz

## 2020-12-20 LAB — LIPASE, BLOOD: Lipase: 95 U/L — ABNORMAL HIGH (ref 11–51)

## 2020-12-20 MED ORDER — FLEET ENEMA 7-19 GM/118ML RE ENEM
1.0000 | ENEMA | Freq: Once | RECTAL | Status: AC
  Administered 2020-12-20: 16:00:00 1 via RECTAL

## 2020-12-20 MED ORDER — LACTULOSE 10 GM/15ML PO SOLN
30.0000 g | Freq: Every day | ORAL | Status: DC | PRN
  Administered 2020-12-21: 04:00:00 30 g via ORAL
  Filled 2020-12-20: qty 60

## 2020-12-20 NOTE — Progress Notes (Signed)
[facility]  ROUNDING NOTE   Subjective:   Patient awake,daughter at bedside. Renal function improving gradually. F/C draining with dark amber urine output.   Objective:  Vital signs in last 24 hours:  Temp:  [97.8 F (36.6 C)-98.7 F (37.1 C)] 98.4 F (36.9 C) (03/20 1230) Pulse Rate:  [74-89] 80 (03/20 1230) Resp:  [16-20] 20 (03/20 1230) BP: (126-147)/(53-65) 126/63 (03/20 1230) SpO2:  [95 %-100 %] 97 % (03/20 1230)  Weight change:  Filed Weights   12/16/20 1116 12/17/20 1206  Weight: 83 kg 83.4 kg    Intake/Output: I/O last 3 completed shifts: In: 827.3 [P.O.:240; I.V.:401.6; Other:10; IV Piggyback:175.7] Out: 3335 [Urine:3950; Emesis/NG output:2; Drains:400]   Intake/Output this shift:  Total I/O In: 10 [Other:10] Out: 250 [Drains:250]  Physical Exam: General: In no acute distress, pleasant and appears comfortable  Head: Normocephalic, atraumatic  Lungs:  Respirations even, slightly labored with exertion, lungs clear  Heart: S1S2,+murmur  Abdomen:  Soft, nontender, non distended  Extremities:  No peripheral edema.  Neurologic: Able to answer simple questions  Skin: No acute  Lesions or rashes    Basic Metabolic Panel: Recent Labs  Lab 12/16/20 2030 12/17/20 0504 12/18/20 0732 12/19/20 0723 12/20/20 0813  NA 136 138 138 137 140  K 4.3 3.9 4.1 4.0 4.6  CL 113* 116* 117* 117* 118*  CO2 16* 14* 14* 14* 15*  GLUCOSE 109* 99 101* 111* 120*  BUN 57* 58* 58* 54* 54*  CREATININE 2.60* 2.62* 2.59* 2.15* 2.01*  CALCIUM 8.1* 8.0* 8.2* 8.3* 8.6*  MG 3.0*  --   --   --   --     Liver Function Tests: Recent Labs  Lab 12/16/20 1119 12/18/20 0732  AST 26 20  ALT 22 20  ALKPHOS 62 60  BILITOT 0.6 0.7  PROT 6.3* 5.5*  ALBUMIN 3.1* 2.5*   Recent Labs  Lab 12/16/20 1119 12/18/20 0732 12/19/20 0723 12/20/20 0813  LIPASE 89* 74* 81* 95*   No results for input(s): AMMONIA in the last 168 hours.  CBC: Recent Labs  Lab 12/16/20 1119  12/17/20 0504 12/18/20 0732 12/19/20 0723  WBC 16.7* 19.0* 16.0* 14.8*  HGB 10.1* 9.4* 8.9* 9.1*  HCT 30.9* 28.2* 27.0* 26.4*  MCV 100.7* 100.7* 99.6 98.1  PLT 210 195 187 212    Cardiac Enzymes: No results for input(s): CKTOTAL, CKMB, CKMBINDEX, TROPONINI in the last 168 hours.  BNP: Invalid input(s): POCBNP  CBG: No results for input(s): GLUCAP in the last 168 hours.  Microbiology: Results for orders placed or performed during the hospital encounter of 12/16/20  Blood culture (routine x 2)     Status: None (Preliminary result)   Collection Time: 12/16/20  3:36 PM   Specimen: BLOOD  Result Value Ref Range Status   Specimen Description BLOOD LEFT ANTECUBITAL  Final   Special Requests   Final    BOTTLES DRAWN AEROBIC AND ANAEROBIC Blood Culture adequate volume   Culture   Final    NO GROWTH 4 DAYS Performed at [facility], [address] [place], [place] [address]    Report Status PENDING  Incomplete  Blood culture (routine x 2)     Status: None (Preliminary result)   Collection Time: 12/16/20  3:36 PM   Specimen: BLOOD  Result Value Ref Range Status   Specimen Description BLOOD BLOOD LEFT FOREARM  Final   Special Requests   Final    BOTTLES DRAWN AEROBIC AND ANAEROBIC Blood Culture adequate volume  Culture   Final    NO GROWTH 4 DAYS Performed at [facility], [place], [place] [address]    Report Status PENDING  Incomplete  SARS CORONAVIRUS 2 (TAT 6-24 HRS) Nasopharyngeal Nasopharyngeal Swab     Status: None   Collection Time: 12/17/20 10:23 AM   Specimen: Nasopharyngeal Swab  Result Value Ref Range Status   SARS Coronavirus 2 NEGATIVE NEGATIVE Final    Comment: (NOTE) SARS-CoV-2 target nucleic acids are NOT DETECTED.  The SARS-CoV-2 RNA is generally detectable in upper and lower respiratory specimens during the acute phase of infection. Negative results do not preclude SARS-CoV-2 infection, do not rule out co-infections  with other pathogens, and should not be used as the sole basis for treatment or other patient management decisions. Negative results must be combined with clinical observations, patient history, and epidemiological information. The expected result is Negative.  Fact Sheet for Patients: SugarRoll.be  Fact Sheet for Healthcare Providers: https://www.woods-mathews.com/  This test is not yet approved or cleared by the [place] FDA and  has been authorized for detection and/or diagnosis of SARS-CoV-2 by FDA under an Emergency Use Authorization (EUA). This EUA will remain  in effect (meaning this test can be used) for the duration of the COVID-19 declaration under Se ction 564(b)(1) of the Act, 21 U.S.C. section 360bbb-3(b)(1), unless the authorization is terminated or revoked sooner.  Performed at [facility], [place] [address], [place],  [address]     Coagulation Studies: No results for input(s): LABPROT, INR in the last 72 hours.  Urinalysis: No results for input(s): COLORURINE, LABSPEC, PHURINE, GLUCOSEU, HGBUR, BILIRUBINUR, KETONESUR, PROTEINUR, UROBILINOGEN, NITRITE, LEUKOCYTESUR in the last 72 hours.  Invalid input(s): APPERANCEUR    Imaging: ECHOCARDIOGRAM COMPLETE  Result Date: 12/20/2020    ECHOCARDIOGRAM REPORT   Patient Name:   Lonnie [name]. Date of Exam: 12/19/2020 Medical Rec #:  [id number]          Height:       72.0 in Accession #:    [id number]         Weight:       183.9 lb Date of Birth:  [birth year]           BSA:          2.056 m Patient Age:    87 years           BP:           129/50 mmHg Patient Gender: M                  HR:           84 bpm. Exam Location:  [facility] Procedure: 2D Echo Indications:     Dyspnea R06.00  History:         Patient has prior history of Echocardiogram examinations, most                  recent 07/16/2018.  Sonographer:     Arville Go RDCS Referring Phys:  [id number] Loletha Grayer  Diagnosing Phys: Serafina Royals MD  Sonographer Comments: Suboptimal parasternal window. Image acquisition challenging due to respiratory motion. IMPRESSIONS  1. Left ventricular ejection fraction, by estimation, is 60 to 65%. The left ventricle has normal function. The left ventricle has no regional wall motion abnormalities. Left ventricular diastolic function could not be evaluated.  2. Right ventricular systolic function is normal. The right ventricular size is normal.  3. The mitral  valve is normal in structure. Mild mitral valve regurgitation.  4. The aortic valve is normal in structure. Aortic valve regurgitation is not visualized. FINDINGS  Left Ventricle: Left ventricular ejection fraction, by estimation, is 60 to 65%. The left ventricle has normal function. The left ventricle has no regional wall motion abnormalities. The left ventricular internal cavity size was small. There is no left ventricular hypertrophy. Left ventricular diastolic function could not be evaluated. Right Ventricle: The right ventricular size is normal. No increase in right ventricular wall thickness. Right ventricular systolic function is normal. Left Atrium: Left atrial size was normal in size. Right Atrium: Right atrial size was normal in size. Pericardium: There is no evidence of pericardial effusion. Mitral Valve: The mitral valve is normal in structure. Mild mitral valve regurgitation. Tricuspid Valve: The tricuspid valve is normal in structure. Tricuspid valve regurgitation is mild. Aortic Valve: The aortic valve is normal in structure. Aortic valve regurgitation is not visualized. Aortic valve peak gradient measures 16.3 mmHg. Pulmonic Valve: The pulmonic valve was normal in structure. Pulmonic valve regurgitation is not visualized. Aorta: The aortic root and ascending aorta are structurally normal, with no evidence of dilitation. IAS/Shunts: No atrial level shunt detected by color flow Doppler.  LEFT VENTRICLE PLAX 2D LVIDd:          3.24 cm  Diastology LVIDs:         2.21 cm  LV e' medial:    6.96 cm/s LV PW:         1.55 cm  LV E/e' medial:  16.8 LV IVS:        1.31 cm  LV e' lateral:   9.57 cm/s LVOT diam:     2.20 cm  LV E/e' lateral: 12.2 LV SV:         84 LV SV Index:   41 LVOT Area:     3.80 cm  RIGHT VENTRICLE RV Basal diam:  2.84 cm RV S prime:     9.57 cm/s TAPSE (M-mode): 2.4 cm LEFT ATRIUM             Index       RIGHT ATRIUM           Index LA diam:        3.30 cm 1.61 cm/m  RA Area:     15.70 cm LA Vol (A2C):   27.1 ml 13.18 ml/m RA Volume:   47.20 ml  22.96 ml/m LA Vol (A4C):   31.0 ml 15.08 ml/m LA Biplane Vol: 31.4 ml 15.27 ml/m  AORTIC VALVE AV Area (Vmax): 2.26 cm AV Vmax:        202.00 cm/s AV Peak Grad:   16.3 mmHg LVOT Vmax:      120.00 cm/s LVOT Vmean:     72.800 cm/s LVOT VTI:       0.222 m  AORTA Ao Root diam: 3.00 cm MITRAL VALVE                TRICUSPID VALVE MV Area (PHT): 2.76 cm     TV Peak grad:   30.9 mmHg MV Decel Time: 275 msec     TV Vmax:        2.78 m/s MV E velocity: 117.00 cm/s MV A velocity: 141.00 cm/s  SHUNTS MV E/A ratio:  0.83         Systemic VTI:  0.22 m  Systemic Diam: 2.20 cm Serafina Royals MD Electronically signed by Serafina Royals MD Signature Date/Time: 12/20/2020/6:38:28 AM    Final      Medications:   . sodium chloride 40 mL/hr at 12/19/20 1558  . ampicillin-sulbactam (UNASYN) IV 3 g (12/20/20 0624)  . fluconazole (DIFLUCAN) IV Stopped (12/19/20 1402)   . alfuzosin  10 mg Oral Q breakfast  . allopurinol  50 mg Oral q morning  . vitamin C  250 mg Oral BID  . aspirin EC  81 mg Oral Daily  . atorvastatin  40 mg Oral q morning  . Chlorhexidine Gluconate Cloth  6 each Topical Daily  . enoxaparin (LOVENOX) injection  30 mg Subcutaneous Q24H  . feeding supplement  237 mL Oral BID BM  . ferrous sulfate  325 mg Oral BID WC  . finasteride  5 mg Oral BH-q7a  . gabapentin  100 mg Oral BID  . Ipratropium-Albuterol  2 puff Inhalation QID  .  magnesium oxide  400 mg Oral q morning  . melatonin  5 mg Oral QHS  . mometasone-formoterol  2 puff Inhalation BID  . NIFEdipine  60 mg Oral q morning  . nystatin   Topical TID  . omega-3 acid ethyl esters  1 g Oral Daily  . pantoprazole  40 mg Oral Daily  . senna-docusate  2 tablet Oral Daily  . sodium phosphate  1 enema Rectal Once  . vitamin B-12  1,000 mcg Oral Daily   bisacodyl, lactulose, morphine injection, ondansetron **OR** ondansetron (ZOFRAN) IV  Assessment/ Plan:  Mr. [place] Kuang. is a 87 y.o.  male presented to the ER from home by EMS for weakness.  He recently had a cholecystostomy drain placed He required In-N-Out cath and 700 cc of urine was obtained Foley catheter was then inserted He also has intra-abdominal infection/abscess with enterococcal PCM.  Treated with Unasyn.  He was evaluated by general surgeon-gallbladders was thought to be well decompressed.  No bowel obstruction or gaseous distention.  No surgical intervention recommended.  #Acute Kidney Injury  Baseline creatinine appears to be 1.43/GFR 46 from December 08, 2020 Acute kidney injury secondary to urinary retention, IV contrast exposure on March 8 and concurrent illness likely causing ATN Lab Results  Component Value Date   CREATININE 2.01 (H) 12/20/2020   CREATININE 2.15 (H) 12/19/2020   CREATININE 2.59 (H) 12/18/2020  ` UOP for the preceding 24 hours is 2650 ml Creatinine gradually and steadily improving   # Acute Urinary retention  Continues to have F/C to relieve retention Urine output adequate He is on Alfuzosin 10mg  and Finasteride 5 mg  daily for BPH  Crosby Oyster , MD [facility] 3/20/20221:26 PM     LOS: 4 Keyonna [place]un[place] 3/20/20221:26 PM

## 2020-12-20 NOTE — Progress Notes (Signed)
Patient ID: Lonnie [name]., male   DOB: 17-Nov-1926, 87 y.o.   MRN: [id number] [facility] PROGRESS NOTE  [place] A Lonnie [name]. VXB:[id number] DOB: 1926/10/26 DOA: 12/16/2020 PCP: Derinda Late, MD  HPI/Subjective: Patient developed some abdominal pain yesterday.  Vomited this a.m. and felt better after vomiting.  Positive for constipation.  Abdominal pain gone after vomiting.  Admitted with weakness.  Objective: Vitals:   12/20/20 0825 12/20/20 1230  BP: (!) 145/55 126/63  Pulse: 85 80  Resp: 18 20  Temp: 98.4 F (36.9 C) 98.4 F (36.9 C)  SpO2: 97% 97%    Intake/Output Summary (Last 24 hours) at 12/20/2020 1248 Last data filed at 12/20/2020 0916 Gross per 24 hour  Intake 536.56 ml  Output 2902 ml  Net -2365.44 ml   Filed Weights   12/16/20 1116 12/17/20 1206  Weight: 83 kg 83.4 kg    ROS: Review of Systems  Respiratory: Negative for shortness of breath.   Cardiovascular: Negative for chest pain.  Gastrointestinal: Positive for abdominal pain, constipation and vomiting.   Exam: Physical Exam HENT:     Head: Normocephalic.     Mouth/Throat:     Pharynx: No oropharyngeal exudate.  Eyes:     General: Lids are normal.     Conjunctiva/sclera: Conjunctivae normal.  Cardiovascular:     Rate and Rhythm: Normal rate and regular rhythm.     Heart sounds: Normal heart sounds, S1 normal and S2 normal.  Pulmonary:     Breath sounds: Normal breath sounds. No decreased breath sounds, wheezing, rhonchi or rales.  Abdominal:     Palpations: Abdomen is soft.     Tenderness: There is no abdominal tenderness.  Musculoskeletal:     Right lower leg: Swelling present.     Left lower leg: Swelling present.  Skin:    General: Skin is warm.     Findings: No rash.  Neurological:     Mental Status: He is alert and oriented to person, place, and time.       Data Reviewed: Basic Metabolic Panel: Recent Labs  Lab 12/16/20 1119 12/16/20 2030 12/17/20 0504 12/18/20 0732  12/19/20 0723  NA 138 136 138 138 137  K 4.1 4.3 3.9 4.1 4.0  CL 113* 113* 116* 117* 117*  CO2 15* 16* 14* 14* 14*  GLUCOSE 170* 109* 99 101* 111*  BUN 56* 57* 58* 58* 54*  CREATININE 2.51* 2.60* 2.62* 2.59* 2.15*  CALCIUM 8.4* 8.1* 8.0* 8.2* 8.3*  MG  --  3.0*  --   --   --    Liver Function Tests: Recent Labs  Lab 12/16/20 1119 12/18/20 0732  AST 26 20  ALT 22 20  ALKPHOS 62 60  BILITOT 0.6 0.7  PROT 6.3* 5.5*  ALBUMIN 3.1* 2.5*   Recent Labs  Lab 12/16/20 1119 12/18/20 0732 12/19/20 0723 12/20/20 0813  LIPASE 89* 74* 81* 95*   CBC: Recent Labs  Lab 12/16/20 1119 12/17/20 0504 12/18/20 0732 12/19/20 0723  WBC 16.7* 19.0* 16.0* 14.8*  HGB 10.1* 9.4* 8.9* 9.1*  HCT 30.9* 28.2* 27.0* 26.4*  MCV 100.7* 100.7* 99.6 98.1  PLT 210 195 187 212     Recent Results (from the past 240 hour(s))  Blood culture (routine x 2)     Status: None (Preliminary result)   Collection Time: 12/16/20  3:36 PM   Specimen: BLOOD  Result Value Ref Range Status   Specimen Description BLOOD LEFT ANTECUBITAL  Final   Special Requests  Final    BOTTLES DRAWN AEROBIC AND ANAEROBIC Blood Culture adequate volume   Culture   Final    NO GROWTH 4 DAYS Performed at [facility], [address] [place], [place] [address]    Report Status PENDING  Incomplete  Blood culture (routine x 2)     Status: None (Preliminary result)   Collection Time: 12/16/20  3:36 PM   Specimen: BLOOD  Result Value Ref Range Status   Specimen Description BLOOD BLOOD LEFT FOREARM  Final   Special Requests   Final    BOTTLES DRAWN AEROBIC AND ANAEROBIC Blood Culture adequate volume   Culture   Final    NO GROWTH 4 DAYS Performed at [facility], [address] [place], [place] [address]    Report Status PENDING  Incomplete  SARS CORONAVIRUS 2 (TAT 6-24 HRS) Nasopharyngeal Nasopharyngeal Swab     Status: None   Collection Time: 12/17/20 10:23 AM   Specimen: Nasopharyngeal Swab  Result  Value Ref Range Status   SARS Coronavirus 2 NEGATIVE NEGATIVE Final    Comment: (NOTE) SARS-CoV-2 target nucleic acids are NOT DETECTED.  The SARS-CoV-2 RNA is generally detectable in upper and lower respiratory specimens during the acute phase of infection. Negative results do not preclude SARS-CoV-2 infection, do not rule out co-infections with other pathogens, and should not be used as the sole basis for treatment or other patient management decisions. Negative results must be combined with clinical observations, patient history, and epidemiological information. The expected result is Negative.  Fact Sheet for Patients: SugarRoll.be  Fact Sheet for Healthcare Providers: https://www.woods-mathews.com/  This test is not yet approved or cleared by the [place] FDA and  has been authorized for detection and/or diagnosis of SARS-CoV-2 by FDA under an Emergency Use Authorization (EUA). This EUA will remain  in effect (meaning this test can be used) for the duration of the COVID-19 declaration under Se ction 564(b)(1) of the Act, 21 U.S.C. section 360bbb-3(b)(1), unless the authorization is terminated or revoked sooner.  Performed at [facility], [place] [address] [place],  [address]      Studies: ECHOCARDIOGRAM COMPLETE  Result Date: 12/20/2020    ECHOCARDIOGRAM REPORT   Patient Name:   Lonnie [name]. Date of Exam: 12/19/2020 Medical Rec #:  [id number]          Height:       72.0 in Accession #:    [id number]         Weight:       183.9 lb Date of Birth:  05-13-1927           BSA:          2.056 m Patient Age:    69 years           BP:           129/50 mmHg Patient Gender: M                  HR:           84 bpm. Exam Location:  [facility] Procedure: 2D Echo Indications:     Dyspnea R06.00  History:         Patient has prior history of Echocardiogram examinations, most                  recent 07/16/2018.  Sonographer:     Arville Go RDCS Referring Phys:  [id number] Loletha Grayer Diagnosing Phys: Serafina Royals  MD  Sonographer Comments: Suboptimal parasternal window. Image acquisition challenging due to respiratory motion. IMPRESSIONS  1. Left ventricular ejection fraction, by estimation, is 60 to 65%. The left ventricle has normal function. The left ventricle has no regional wall motion abnormalities. Left ventricular diastolic function could not be evaluated.  2. Right ventricular systolic function is normal. The right ventricular size is normal.  3. The mitral valve is normal in structure. Mild mitral valve regurgitation.  4. The aortic valve is normal in structure. Aortic valve regurgitation is not visualized. FINDINGS  Left Ventricle: Left ventricular ejection fraction, by estimation, is 60 to 65%. The left ventricle has normal function. The left ventricle has no regional wall motion abnormalities. The left ventricular internal cavity size was small. There is no left ventricular hypertrophy. Left ventricular diastolic function could not be evaluated. Right Ventricle: The right ventricular size is normal. No increase in right ventricular wall thickness. Right ventricular systolic function is normal. Left Atrium: Left atrial size was normal in size. Right Atrium: Right atrial size was normal in size. Pericardium: There is no evidence of pericardial effusion. Mitral Valve: The mitral valve is normal in structure. Mild mitral valve regurgitation. Tricuspid Valve: The tricuspid valve is normal in structure. Tricuspid valve regurgitation is mild. Aortic Valve: The aortic valve is normal in structure. Aortic valve regurgitation is not visualized. Aortic valve peak gradient measures 16.3 mmHg. Pulmonic Valve: The pulmonic valve was normal in structure. Pulmonic valve regurgitation is not visualized. Aorta: The aortic root and ascending aorta are structurally normal, with no evidence of dilitation. IAS/Shunts: No atrial level shunt detected by  color flow Doppler.  LEFT VENTRICLE PLAX 2D LVIDd:         3.24 cm  Diastology LVIDs:         2.21 cm  LV e' medial:    6.96 cm/s LV PW:         1.55 cm  LV E/e' medial:  16.8 LV IVS:        1.31 cm  LV e' lateral:   9.57 cm/s LVOT diam:     2.20 cm  LV E/e' lateral: 12.2 LV SV:         84 LV SV Index:   41 LVOT Area:     3.80 cm  RIGHT VENTRICLE RV Basal diam:  2.84 cm RV S prime:     9.57 cm/s TAPSE (M-mode): 2.4 cm LEFT ATRIUM             Index       RIGHT ATRIUM           Index LA diam:        3.30 cm 1.61 cm/m  RA Area:     15.70 cm LA Vol (A2C):   27.1 ml 13.18 ml/m RA Volume:   47.20 ml  22.96 ml/m LA Vol (A4C):   31.0 ml 15.08 ml/m LA Biplane Vol: 31.4 ml 15.27 ml/m  AORTIC VALVE AV Area (Vmax): 2.26 cm AV Vmax:        202.00 cm/s AV Peak Grad:   16.3 mmHg LVOT Vmax:      120.00 cm/s LVOT Vmean:     72.800 cm/s LVOT VTI:       0.222 m  AORTA Ao Root diam: 3.00 cm MITRAL VALVE                TRICUSPID VALVE MV Area (PHT): 2.76 cm     TV Peak grad:   30.9 mmHg  MV Decel Time: 275 msec     TV Vmax:        2.78 m/s MV E velocity: 117.00 cm/s MV A velocity: 141.00 cm/s  SHUNTS MV E/A ratio:  0.83         Systemic VTI:  0.22 m                             Systemic Diam: 2.20 cm Serafina Royals MD Electronically signed by Serafina Royals MD Signature Date/Time: 12/20/2020/6:38:28 AM    Final     Scheduled Meds: . alfuzosin  10 mg Oral Q breakfast  . allopurinol  50 mg Oral q morning  . vitamin C  250 mg Oral BID  . aspirin EC  81 mg Oral Daily  . atorvastatin  40 mg Oral q morning  . Chlorhexidine Gluconate Cloth  6 each Topical Daily  . enoxaparin (LOVENOX) injection  30 mg Subcutaneous Q24H  . feeding supplement  237 mL Oral BID BM  . ferrous sulfate  325 mg Oral BID WC  . finasteride  5 mg Oral BH-q7a  . gabapentin  100 mg Oral BID  . Ipratropium-Albuterol  2 puff Inhalation QID  . magnesium oxide  400 mg Oral q morning  . melatonin  5 mg Oral QHS  . mometasone-formoterol  2 puff Inhalation  BID  . NIFEdipine  60 mg Oral q morning  . nystatin   Topical TID  . omega-3 acid ethyl esters  1 g Oral Daily  . pantoprazole  40 mg Oral Daily  . senna-docusate  2 tablet Oral Daily  . sodium phosphate  1 enema Rectal Once  . vitamin B-12  1,000 mcg Oral Daily   Continuous Infusions: . sodium chloride 40 mL/hr at 12/19/20 1558  . ampicillin-sulbactam (UNASYN) IV 3 g (12/20/20 0624)  . fluconazole (DIFLUCAN) IV Stopped (12/19/20 1402)    Assessment/Plan:  1. Abdominal pain with vomiting.  Patient also has constipation.  Will give an enema and lactulose as needed.  Obtain abdominal x-ray.  Patient felt better after vomiting.  Came back to clear liquid diet.  As needed Zosyn 2. Acute kidney injury on chronic kidney disease stage IIIa.  Creatinine improved to 2.15 yesterday and was as high as 2.61 12/07/2020.  Creatinine today pending.  Baseline creatinine 1.64. 3. Acute cholecystitis status post percutaneous gallbladder drain.  Pancreatic enzymes still elevated clear liquid diet.  Empiric Unasyn.  Patient still has leukocytosis.  Abdominal CT scan commented on the patient's pancreas being normal. 4. Wide-complex tachycardia on presentation received 1 dose of amiodarone.  Patient on Adalat CC.  Echocardiogram shows a normal ejection fraction. 5. Thrush.  This has improved.  Continue IV Diflucan 6. BPH with urinary retention.  Has Foley catheter.  Continue alfuzosin and Proscar 7. Essential hypertension on nifedipine 8. Hyperlipidemia unspecified on atorvastatin 9. Weakness.  Physical therapy recommends rehab 10. We will get palliative care consultation   Code Status:     Code Status Orders  (From admission, onward)         Start     Ordered   12/16/20 1709  Full code  Continuous        12/16/20 1710        Code Status History    Date Active Date Inactive Code Status Order ID Comments User Context   12/08/2020 1621 12/14/2020 2348 Full Code [id number]  [facility], Briant Cedar, DO ED  08/11/2018 2104 08/15/2018 1748 Full Code [id number]  Vaughan Basta, MD Inpatient   01/10/2016 2016 01/18/2016 1943 Full Code [id number]  Henreitta Leber, MD Inpatient   Advance Care Planning Activity    Advance Directive Documentation   Flowsheet Row Most Recent Value  Type of Advance Directive Healthcare Power of Attorney, Living will  Pre-existing out of facility DNR order (yellow form or pink MOST form) --  "MOST" Form in Place? --     Family Communication: Spoke with patient's daughter [name] Disposition Plan: Status is: Inpatient  Dispo: The patient is from: Home              Anticipated d/c is to: Rehab              Patient currently not eating much and had vomiting.  Patient has acute cholecystitis on IV Unasyn.   Difficult to place patient.  Hopefully not.  Antibiotics:  Unasyn  Diflucan  Time spent: 29 minutes  [place]

## 2020-12-21 ENCOUNTER — Encounter: Payer: Self-pay | Admitting: Internal Medicine

## 2020-12-21 ENCOUNTER — Inpatient Hospital Stay: Payer: No Typology Code available for payment source

## 2020-12-21 DIAGNOSIS — R531 Weakness: Secondary | ICD-10-CM | POA: Diagnosis not present

## 2020-12-21 DIAGNOSIS — Z7189 Other specified counseling: Secondary | ICD-10-CM

## 2020-12-21 DIAGNOSIS — N1831 Chronic kidney disease, stage 3a: Secondary | ICD-10-CM | POA: Diagnosis not present

## 2020-12-21 DIAGNOSIS — K810 Acute cholecystitis: Secondary | ICD-10-CM | POA: Diagnosis not present

## 2020-12-21 DIAGNOSIS — K567 Ileus, unspecified: Secondary | ICD-10-CM | POA: Diagnosis not present

## 2020-12-21 DIAGNOSIS — K5900 Constipation, unspecified: Secondary | ICD-10-CM | POA: Diagnosis not present

## 2020-12-21 DIAGNOSIS — Z515 Encounter for palliative care: Secondary | ICD-10-CM

## 2020-12-21 DIAGNOSIS — N179 Acute kidney failure, unspecified: Secondary | ICD-10-CM | POA: Diagnosis not present

## 2020-12-21 LAB — CULTURE, BLOOD (ROUTINE X 2)
Culture: NO GROWTH
Culture: NO GROWTH
Special Requests: ADEQUATE
Special Requests: ADEQUATE

## 2020-12-21 LAB — CBC
HCT: 25.2 % — ABNORMAL LOW (ref 39.0–52.0)
Hemoglobin: 8.2 g/dL — ABNORMAL LOW (ref 13.0–17.0)
MCH: 32.5 pg (ref 26.0–34.0)
MCHC: 32.5 g/dL (ref 30.0–36.0)
MCV: 100 fL (ref 80.0–100.0)
Platelets: 238 10*3/uL (ref 150–400)
RBC: 2.52 MIL/uL — ABNORMAL LOW (ref 4.22–5.81)
RDW: 13.7 % (ref 11.5–15.5)
WBC: 10.8 10*3/uL — ABNORMAL HIGH (ref 4.0–10.5)
nRBC: 0 % (ref 0.0–0.2)

## 2020-12-21 LAB — BASIC METABOLIC PANEL
Anion gap: 6 (ref 5–15)
BUN: 55 mg/dL — ABNORMAL HIGH (ref 8–23)
CO2: 16 mmol/L — ABNORMAL LOW (ref 22–32)
Calcium: 8.6 mg/dL — ABNORMAL LOW (ref 8.9–10.3)
Chloride: 120 mmol/L — ABNORMAL HIGH (ref 98–111)
Creatinine, Ser: 1.85 mg/dL — ABNORMAL HIGH (ref 0.61–1.24)
GFR, Estimated: 34 mL/min — ABNORMAL LOW (ref >60)
Glucose, Bld: 104 mg/dL — ABNORMAL HIGH (ref 70–99)
Potassium: 4.1 mmol/L (ref 3.5–5.1)
Sodium: 142 mmol/L (ref 135–145)

## 2020-12-21 LAB — LIPASE, BLOOD: Lipase: 76 U/L — ABNORMAL HIGH (ref 11–51)

## 2020-12-21 MED ORDER — FLEET ENEMA 7-19 GM/118ML RE ENEM
1.0000 | ENEMA | Freq: Two times a day (BID) | RECTAL | Status: DC
  Administered 2020-12-21: 1 via RECTAL

## 2020-12-21 MED ORDER — PANTOPRAZOLE SODIUM 40 MG IV SOLR
40.0000 mg | INTRAVENOUS | Status: DC
  Administered 2020-12-21: 12:00:00 40 mg via INTRAVENOUS
  Filled 2020-12-21 (×2): qty 40

## 2020-12-21 MED ORDER — LORAZEPAM 2 MG/ML IJ SOLN: 1.0000 mg | Freq: Once | INTRAMUSCULAR | Status: DC

## 2020-12-21 NOTE — Progress Notes (Signed)
Patient ID: Lonnie [name]., male   DOB: [birth year], 87 y.o.   MRN: [id number]     [facility] Day(s): 5.   Post op day(s):  Marland Kitchen   Interval History: Patient seen and examined.  He reports not feeling well.  He reported nauseous.  He report he had couple of episode of vomiting last night.  Reports passing gas.  Denies pain.  There is some radiation.  There is no alleviating or aggravating factors.  Vital signs in last 24 hours: [min-max] current  Temp:  [98 F (36.7 C)-98.4 F (36.9 C)] 98 F (36.7 C) (03/21 1548) Pulse Rate:  [73-88] 73 (03/21 1548) Resp:  [16-18] 16 (03/21 1548) BP: (120-149)/(47-58) 149/53 (03/21 1548) SpO2:  [95 %-100 %] 97 % (03/21 1548)     Height: 6' (182.9 cm) Weight: 83.4 kg BMI (Calculated): 24.93   Physical Exam:  Constitutional: alert, cooperative and no distress  Respiratory: breathing non-labored at rest  Cardiovascular: regular rate and sinus rhythm  Gastrointestinal: soft, non-tender, and distended  Labs:  CBC Latest Ref Rng & Units 12/21/2020 12/19/2020 12/18/2020  WBC 4.0 - 10.5 K/uL 10.8(H) 14.8(H) 16.0(H)  Hemoglobin 13.0 - 17.0 g/dL 8.2(L) 9.1(L) 8.9(L)  Hematocrit 39.0 - 52.0 % 25.2(L) 26.4(L) 27.0(L)  Platelets 150 - 400 K/uL 238 212 187   CMP Latest Ref Rng & Units 12/21/2020 12/20/2020 12/19/2020  Glucose 70 - 99 mg/dL 104(H) 120(H) 111(H)  BUN 8 - 23 mg/dL 55(H) 54(H) 54(H)  Creatinine 0.61 - 1.24 mg/dL 1.85(H) 2.01(H) 2.15(H)  Sodium 135 - 145 mmol/L 142 140 137  Potassium 3.5 - 5.1 mmol/L 4.1 4.6 4.0  Chloride 98 - 111 mmol/L 120(H) 118(H) 117(H)  CO2 22 - 32 mmol/L 16(L) 15(L) 14(L)  Calcium 8.9 - 10.3 mg/dL 8.6(L) 8.6(L) 8.3(L)  Total Protein 6.5 - 8.1 g/dL - - -  Total Bilirubin 0.3 - 1.2 mg/dL - - -  Alkaline Phos 38 - 126 U/L - - -  AST 15 - 41 U/L - - -  ALT 0 - 44 U/L - - -    Imaging studies: I personally evaluated the x-ray that shows significant small bowel dilation with abundant amount of stool in the  large intestine.  This is consistent with ileus.  There is no free air.  I personally evaluated the images.   Assessment/Plan:  87 y.o. male with generalized weakness, acute kidney injury, urinary retention, recent cholecystitis resolved with percutaneous cholecystostomy, complicated by pertinent comorbidities including hypertension, chronic kidney disease stage III, COPD, coronary tree disease, hyperlipidemia.  I was contacted by for evaluation of ileus.  Patient with multiple vomit episode yesterday.  NGT was placed after x-ray shows small bowel dilation.  X-rays show significant amount of stool in the large intestine.  This might be the cause of the small bowel dilation.  I would recommend to continue enemas, Monitor electrolytes, encourage mobilization as tolerated and continue conservative management.  There is no indication for surgical management at this moment.  I will continue to follow.  Arnold Long, MD

## 2020-12-21 NOTE — Progress Notes (Signed)
Physical Therapy Treatment Patient Details Name: Lonnie [name]. MRN: [id number] DOB: 08/13/1927 Today's Date: 12/21/2020    History of Present Illness Patient is a 87 year old male who was brought into the ER by EMS for evaluation of weakness and abdominal pain.  Patient was recently discharged from hospital following treatment for acute cholecystitis.  He is status post cholecystostomy tube placement and culture yielded Enterococcus faecium.  Patient was discharged home on Augmentin.  Labs show worsening leukocytosis with a left shift and normal lactic acid level.  Imaging shows gas /fluid-filled distended stomach.  He will be admitted to the hospital for further evaluation. PMH: anemia, L BBB, CKD stage 3, COPD, CAD, GERD, hx of asbestosis, HLD, HTN, penumonia, PSA elevation    PT Comments    Pt alert with daughter at bedside and agreeable to PT. Supine, LE strength exercises performed requiring multimodal cuing for improving motion and targeting correct musculature. ModA+2 with HOB elevated and use of BUE's on bed rail by pt to sit EOB. ModA+2 for STS transfer with bed elevated and RW use. Pt able to take side steps towards head of bed and RW with minguard and returned to sitting. ModA+1 for LE management to returning supine in bed. MaxA+2 for scooting pt up in bed. Pt placed in quarter turned to the L for pressure relief. Per previous PT note, pt appears more fatigued and weak from previous treatment session but remains motivated and would benefit from further skilled PT services to maximize return to PLOF. At this time, PT recommendations remain appropriate.      Follow Up Recommendations  SNF;Supervision/Assistance - 24 hour;Supervision for mobility/OOB     Equipment Recommendations  None recommended by PT    Recommendations for Other Services       Precautions / Restrictions Precautions Precautions: Fall Precaution Comments: R side drain    Mobility  Bed Mobility Overal bed  mobility: Needs Assistance Bed Mobility: Supine to Sit;Sit to Supine     Supine to sit: Mod assist Sit to supine: Mod assist   General bed mobility comments: Extra time due to fatigue    Transfers Overall transfer level: Needs assistance Equipment used: Rolling walker (2 wheeled) Transfers: Sit to/from Stand Sit to Stand: Mod assist;+2 physical assistance         General transfer comment: ModA with RW  Ambulation/Gait Ambulation/Gait assistance: +2 physical assistance               Stairs             Wheelchair Mobility    Modified Rankin (Stroke Patients Only)       Balance Overall balance assessment: Needs assistance Sitting-balance support: Feet supported Sitting balance-Leahy Scale: Fair Sitting balance - Comments: close supervision sitting EOB   Standing balance support: Bilateral upper extremity supported Standing balance-Leahy Scale: Poor Standing balance comment: Required RW for standing                            Cognition Arousal/Alertness: Awake/alert Behavior During Therapy: WFL for tasks assessed/performed Overall Cognitive Status: Within Functional Limits for tasks assessed                                        Exercises General Exercises - Lower Extremity Ankle Circles/Pumps: AROM;Both;20 reps Quad Sets: Strengthening;Both;10 reps Heel Slides: AROM;Both;10 reps Hip ABduction/ADduction: AROM;Both;10  reps Hip Flexion/Marching: Strengthening;Both;10 reps    General Comments        Pertinent Vitals/Pain Pain Assessment:  (Pain from NPO tube with bed mobility and standing) Pain Location: with coughing Pain Descriptors / Indicators: Grimacing;Guarding;Moaning Pain Intervention(s): Limited activity within patient's tolerance;Monitored during session;Repositioned    Home Living                      Prior Function            PT Goals (current goals can now be found in the care plan  section) Acute Rehab PT Goals PT Goal Formulation: With patient Time For Goal Achievement: 01/01/21 Potential to Achieve Goals: Fair Progress towards PT goals: Progressing toward goals    Frequency    Min 2X/week      PT Plan Current plan remains appropriate    Co-evaluation              AM-PAC PT "6 Clicks" Mobility   Outcome Measure  Help needed turning from your back to your side while in a flat bed without using bedrails?: A Lot Help needed moving from lying on your back to sitting on the side of a flat bed without using bedrails?: A Lot Help needed moving to and from a bed to a chair (including a wheelchair)?: A Lot Help needed standing up from a chair using your arms (e.g., wheelchair or bedside chair)?: A Lot Help needed to walk in hospital room?: A Lot Help needed climbing 3-5 steps with a railing? : Total 6 Click Score: 11    End of Session Equipment Utilized During Treatment: Gait belt Activity Tolerance: Patient limited by fatigue Patient left: in bed;with call bell/phone within reach;with family/visitor present;with bed alarm set (Quarter turned to left for pressure relief) Nurse Communication: Mobility status PT Visit Diagnosis: Unsteadiness on feet (R26.81);Difficulty in walking, not elsewhere classified (R26.2);Muscle weakness (generalized) (M62.81)     Time: 7824-2353 PT Time Calculation (min) (ACUTE ONLY): 22 min  Charges:  $Therapeutic Exercise: 8-22 mins                    Lonnie [name]. Fairly IV, PT, DPT Physical Therapist- [facility]  12/21/2020, 4:34 PM

## 2020-12-21 NOTE — Progress Notes (Signed)
Patient ID: Lonnie [name]., male   DOB: 1927/05/13, 87 y.o.   MRN: [id number] [facility] PROGRESS NOTE  Lonnie [name]. Lonnie [name] DOB: 06/07/1927 Lonnie [name] PCP: Derinda Late, MD  HPI/Subjective: Patient had an NG tube placed last night secondary to abdominal pain and nausea or vomiting.  Repeat x-ray showing constipation distended stomach.  Patient was feeling a little bit better this morning with the NG tube in.  He was asking for some ice chips.  Did not have any abdominal pain this morning.  Admitted with weakness.  Objective: Vitals:   12/21/20 1217 12/21/20 1548  BP: (!) 138/52 (!) 149/53  Pulse: 88 73  Resp: 18 16  Temp: 98.2 F (36.8 C) 98 F (36.7 C)  SpO2: 95% 97%    Intake/Output Summary (Last 24 hours) at 12/21/2020 1808 Last data filed at 12/21/2020 1712 Gross per 24 hour  Intake 3095.48 ml  Output 2150 ml  Net 945.48 ml   Filed Weights   12/16/20 1116 12/17/20 1206  Weight: 83 kg 83.4 kg    ROS: Review of Systems  Respiratory: Negative for shortness of breath.   Cardiovascular: Negative for chest pain.  Gastrointestinal: Positive for constipation. Negative for abdominal pain, nausea and vomiting.   Exam: Physical Exam HENT:     Head: Normocephalic.     Mouth/Throat:     Pharynx: No oropharyngeal exudate.  Eyes:     General: Lids are normal.     Conjunctiva/sclera: Conjunctivae normal.  Cardiovascular:     Rate and Rhythm: Normal rate and regular rhythm.     Heart sounds: Normal heart sounds, S1 normal and S2 normal.  Pulmonary:     Breath sounds: Normal breath sounds. No decreased breath sounds, wheezing, rhonchi or rales.  Abdominal:     Palpations: Abdomen is soft.     Tenderness: There is no abdominal tenderness.  Musculoskeletal:     Right lower leg: Swelling present.     Left lower leg: Swelling present.  Skin:    General: Skin is warm.     Findings: No rash.  Neurological:     Mental Status: He is alert.        Data Reviewed: Basic Metabolic Panel: Recent Labs  Lab 12/16/20 2030 12/17/20 0504 12/18/20 0732 12/19/20 0723 12/20/20 0813 12/21/20 0615  NA 136 138 138 137 140 142  K 4.3 3.9 4.1 4.0 4.6 4.1  CL 113* 116* 117* 117* 118* 120*  CO2 16* 14* 14* 14* 15* 16*  GLUCOSE 109* 99 101* 111* 120* 104*  BUN 57* 58* 58* 54* 54* 55*  CREATININE 2.60* 2.62* 2.59* 2.15* 2.01* 1.85*  CALCIUM 8.1* 8.0* 8.2* 8.3* 8.6* 8.6*  MG 3.0*  --   --   --   --   --    Liver Function Tests: Recent Labs  Lab 12/16/20 1119 12/18/20 0732  AST 26 20  ALT 22 20  ALKPHOS 62 60  BILITOT 0.6 0.7  PROT 6.3* 5.5*  ALBUMIN 3.1* 2.5*   Recent Labs  Lab 12/16/20 1119 12/18/20 0732 12/19/20 0723 12/20/20 0813 12/21/20 0615  LIPASE 89* 74* 81* 95* 76*   CBC: Recent Labs  Lab 12/16/20 1119 12/17/20 0504 12/18/20 0732 12/19/20 0723 12/21/20 0615  WBC 16.7* 19.0* 16.0* 14.8* 10.8*  HGB 10.1* 9.4* 8.9* 9.1* 8.2*  HCT 30.9* 28.2* 27.0* 26.4* 25.2*  MCV 100.7* 100.7* 99.6 98.1 100.0  PLT 210 195 187 212 238     Recent Results (from  the past 240 hour(s))  Blood culture (routine x 2)     Status: None   Collection Time: 12/16/20  3:36 PM   Specimen: BLOOD  Result Value Ref Range Status   Specimen Description BLOOD LEFT ANTECUBITAL  Final   Special Requests   Final    BOTTLES DRAWN AEROBIC AND ANAEROBIC Blood Culture adequate volume   Culture   Final    NO GROWTH 5 DAYS Performed at [facility], [address], [place], [place] [address]    Report Status 12/21/2020 FINAL  Final  Blood culture (routine x 2)     Status: None   Collection Time: 12/16/20  3:36 PM   Specimen: BLOOD  Result Value Ref Range Status   Specimen Description BLOOD BLOOD LEFT FOREARM  Final   Special Requests   Final    BOTTLES DRAWN AEROBIC AND ANAEROBIC Blood Culture adequate volume   Culture   Final    NO GROWTH 5 DAYS Performed at [facility], [address] [place], [place]  [address]    Report Status 12/21/2020 FINAL  Final  SARS CORONAVIRUS 2 (TAT 6-24 HRS) Nasopharyngeal Nasopharyngeal Swab     Status: None   Collection Time: 12/17/20 10:23 AM   Specimen: Nasopharyngeal Swab  Result Value Ref Range Status   SARS Coronavirus 2 NEGATIVE NEGATIVE Final    Comment: (NOTE) SARS-CoV-2 target nucleic acids are NOT DETECTED.  The SARS-CoV-2 RNA is generally detectable in upper and lower respiratory specimens during the acute phase of infection. Negative results do not preclude SARS-CoV-2 infection, do not rule out co-infections with other pathogens, and should not be used as the sole basis for treatment or other patient management decisions. Negative results must be combined with clinical observations, patient history, and epidemiological information. The expected result is Negative.  Fact Sheet for Patients: SugarRoll.be  Fact Sheet for Healthcare Providers: https://www.woods-mathews.com/  This test is not yet approved or cleared by the [place] FDA and  has been authorized for detection and/or diagnosis of SARS-CoV-2 by FDA under an Emergency Use Authorization (EUA). This EUA will remain  in effect (meaning this test can be used) for the duration of the COVID-19 declaration under Se ction 564(b)(1) of the Act, 21 U.S.C. section 360bbb-3(b)(1), unless the authorization is terminated or revoked sooner.  Performed at [facility], [place] [address], [place], [place] [address]      Studies: DG Abd 1 View  Result Date: 12/21/2020 CLINICAL DATA:  NG tube placement EXAM: ABDOMEN - 1 VIEW COMPARISON:  Radiograph 12/21/2020 FINDINGS: Interval placement of a transesophageal tube with the tip and side port distal to the GE junction, terminating near the level of the gastric antrum. Decompression of the previously seen gastric distension. Few persistently air filled loops of small bowel are seen in the upper abdomen.  Redemonstration of a pigtail pleural drain terminating in the right upper quadrant. Scarring and/or atelectasis in the bilateral lung bases. Calcified pleural plaque along the left base. Stable cardiomediastinal contours multilevel degenerative changes are present in the imaged portions of the spine. No acute osseous abnormality or suspicious osseous lesion. IMPRESSION: Interval placement of a transesophageal tube with the tip and side port distal to the GE junction, terminating near the level of the gastric antrum. Decompression of the previously seen gastric distension. Electronically Signed   By: Lovena Le M.D.   On: 12/21/2020 02:43   DG Abd 1 View  Result Date: 12/21/2020 CLINICAL DATA:  Abdominal distension  EXAM: ABDOMEN - 1 VIEW COMPARISON:  12/20/2020 FINDINGS: Increasing gaseous distention of the stomach when compared to recent radiographs. Additional loops of air-filled small bowel are seen throughout the mid abdomen. Large colonic stool burden is noted as well with fecal material throughout the colon and rectal vault. Vascular calcifications project over the abdomen and pelvis. Prostate brachytherapy implants are noted. The osseous structures appear diffusely demineralized which may limit detection of small or nondisplaced fractures. No acute osseous abnormality or suspicious osseous lesion. Atelectatic changes in the lung bases. Pigtail catheter terminates in the right upper quadrant. IMPRESSION: 1. Increasing gaseous distention of the stomach when compared to recent radiographs, consider nasogastric decompression. 2. Additional air-filled loops of small bowel are clustered in the mid abdomen, favor ileus given lack of distal decompression with a large colonic stool burden throughout the colon and rectal vault. 3. Pigtail catheter remains in the right upper quadrant. Electronically Signed   By: Lovena Le M.D.   On: 12/21/2020 01:36   DG Abd 2 Views  Result Date: 12/20/2020 CLINICAL DATA:   Weakness, nausea, vomiting. EXAM: ABDOMEN - 2 VIEW COMPARISON:  CT abdomen pelvis dated 12/16/2020 and abdomen radiograph dated 12/08/2020. FINDINGS: Multiple gas-filled loops of small bowel are seen in the mid abdomen without air-fluid levels. The amount of gas-filled loops as increased since 12/08/2020. Stool is seen in the colon and rectum. There is no evidence of free air. No radio-opaque calculi or other significant radiographic abnormality is seen. A pigtail catheter overlies the right upper quadrant. IMPRESSION: Mild gaseous distention of small bowel without air-fluid levels. This may reflect ileus or early small bowel obstruction. Electronically Signed   By: Zerita Boers M.D.   On: 12/20/2020 15:06    Scheduled Meds: . alfuzosin  10 mg Oral Q breakfast  . allopurinol  50 mg Oral q morning  . aspirin EC  81 mg Oral Daily  . atorvastatin  40 mg Oral q morning  . Chlorhexidine Gluconate Cloth  6 each Topical Daily  . enoxaparin (LOVENOX) injection  30 mg Subcutaneous Q24H  . feeding supplement  237 mL Oral BID BM  . finasteride  5 mg Oral BH-q7a  . gabapentin  100 mg Oral BID  . Ipratropium-Albuterol  2 puff Inhalation QID  . LORazepam  1 mg Intravenous Once  . magnesium oxide  400 mg Oral q morning  . melatonin  5 mg Oral QHS  . mometasone-formoterol  2 puff Inhalation BID  . NIFEdipine  60 mg Oral q morning  . nystatin   Topical TID  . omega-3 acid ethyl esters  1 g Oral Daily  . pantoprazole (PROTONIX) IV  40 mg Intravenous Q24H  . senna-docusate  2 tablet Oral Daily  . sodium phosphate  1 enema Rectal BID  . vitamin B-12  1,000 mcg Oral Daily   Continuous Infusions: . sodium chloride 40 mL/hr at 12/21/20 1709  . ampicillin-sulbactam (UNASYN) IV 200 mL/hr at 12/21/20 1709  . fluconazole (DIFLUCAN) IV 25 mL/hr at 12/21/20 1709    Assessment/Plan:  1. Ileus with distended stomach and constipation.  Fleet enemas x2.  NG tube.  Reconsulted Dr. Windell Moment General surgery.   Continue IV fluid hydration.  As needed Zofran.  Fleet enemas ordered. 2. Acute kidney injury on chronic kidney disease stage IIIa.  Creatinine peaked at 2.61 on 12/07/2020.  Baseline creatinine 1.64.  Today's creatinine down to 1.85. 3. Acute cholecystitis status post percutaneous gallbladder drain.  Patient still has elevated pancreatic enzyme.  On empiric Unasyn.  White blood cell count trending better and down to 10.8 today. 4. Wide-complex tachycardia on presentation.  Better after 1 dose of amiodarone and starting on Adalat CC.  Echocardiogram shows normal ejection fraction.  5. Ritta Slot has improved.  On IV Diflucan. 6. Essential hypertension on Adalat CC 7. Hyperlipidemia unspecified on atorvastatin 8. Generalized weakness.  Repeat PT evaluation 9. Appreciate palliative care consultation     Code Status:     Code Status Orders  (From admission, onward)         Start     Ordered   12/16/20 1709  Full code  Continuous        12/16/20 1710        Code Status History    Date Active Date Inactive Code Status Order ID Comments User Context   12/08/2020 1621 12/14/2020 2348 Full Code [id number]  [facility] Cedar, DO ED   08/11/2018 2104 08/15/2018 1748 Full Code [id number]  Vaughan Basta, MD Inpatient   01/10/2016 2016 01/18/2016 1943 Full Code [id number]  Henreitta Leber, MD Inpatient   Advance Care Planning Activity    Advance Directive Documentation   Flowsheet Row Most Recent Value  Type of Advance Directive Healthcare Power of Attorney, Living will  Pre-existing out of facility DNR order (yellow form or pink MOST form) -  "MOST" Form in Place? -     Family Communication: The patient's daughter [name] called while I was on the floor and I spoke with her. Disposition Plan: Status is: Inpatient  Dispo: The patient is from: Home              Anticipated d/c is to: Rehab              Patient currently now with NG tube.  This will have to come out and patient will have to tolerate  solid food prior to any disposition   Difficult to place patient.  Hopefully not.  Time spent: 27 minutes  [place]

## 2020-12-21 NOTE — Progress Notes (Signed)
[facility]  ROUNDING NOTE   Subjective:   Patient resting in bed requesting ice chips Currently NPO for NGT placed NGT to LIWS Denies nausea and shortness of breath  UOP 1.75L NGT-262ml  Objective:  Vital signs in last 24 hours:  Temp:  [97.7 F (36.5 C)-98.4 F (36.9 C)] 98.1 F (36.7 C) (03/21 0801) Pulse Rate:  [75-88] 75 (03/21 0801) Resp:  [16-20] 16 (03/21 0801) BP: (120-142)/(47-63) 133/50 (03/21 0801) SpO2:  [96 %-100 %] 100 % (03/21 0801)  Weight change:  Filed Weights   12/16/20 1116 12/17/20 1206  Weight: 83 kg 83.4 kg    Intake/Output: I/O last 3 completed shifts: In: 2372.7 [P.O.:420; I.V.:1424.4; Other:20; IV Piggyback:508.3] Out: 7253 [Urine:3750; Emesis/NG output:202; Drains:575]   Intake/Output this shift:  No intake/output data recorded.  Physical Exam: General: In no acute distress, pleasant and appears comfortable  Head: Normocephalic, atraumatic  Lungs:  Respirations even, slightly labored with exertion, lungs clear  Heart: S1S2,+murmur  Abdomen:  Soft, nontender, non distended  Extremities:  No peripheral edema.  Neurologic: Able to answer simple questions  Skin: No acute  Lesions or rashes    Basic Metabolic Panel: Recent Labs  Lab 12/16/20 2030 12/17/20 0504 12/18/20 0732 12/19/20 0723 12/20/20 0813 12/21/20 0615  NA 136 138 138 137 140 142  K 4.3 3.9 4.1 4.0 4.6 4.1  CL 113* 116* 117* 117* 118* 120*  CO2 16* 14* 14* 14* 15* 16*  GLUCOSE 109* 99 101* 111* 120* 104*  BUN 57* 58* 58* 54* 54* 55*  CREATININE 2.60* 2.62* 2.59* 2.15* 2.01* 1.85*  CALCIUM 8.1* 8.0* 8.2* 8.3* 8.6* 8.6*  MG 3.0*  --   --   --   --   --     Liver Function Tests: Recent Labs  Lab 12/16/20 1119 12/18/20 0732  AST 26 20  ALT 22 20  ALKPHOS 62 60  BILITOT 0.6 0.7  PROT 6.3* 5.5*  ALBUMIN 3.1* 2.5*   Recent Labs  Lab 12/16/20 1119 12/18/20 0732 12/19/20 0723 12/20/20 0813 12/21/20 0615  LIPASE 89* 74* 81* 95* 76*   No  results for input(s): AMMONIA in the last 168 hours.  CBC: Recent Labs  Lab 12/16/20 1119 12/17/20 0504 12/18/20 0732 12/19/20 0723 12/21/20 0615  WBC 16.7* 19.0* 16.0* 14.8* 10.8*  HGB 10.1* 9.4* 8.9* 9.1* 8.2*  HCT 30.9* 28.2* 27.0* 26.4* 25.2*  MCV 100.7* 100.7* 99.6 98.1 100.0  PLT 210 195 187 212 238    Cardiac Enzymes: No results for input(s): CKTOTAL, CKMB, CKMBINDEX, TROPONINI in the last 168 hours.  BNP: Invalid input(s): POCBNP  CBG: No results for input(s): GLUCAP in the last 168 hours.  Microbiology: Results for orders placed or performed during the hospital encounter of 12/16/20  Blood culture (routine x 2)     Status: None   Collection Time: 12/16/20  3:36 PM   Specimen: BLOOD  Result Value Ref Range Status   Specimen Description BLOOD LEFT ANTECUBITAL  Final   Special Requests   Final    BOTTLES DRAWN AEROBIC AND ANAEROBIC Blood Culture adequate volume   Culture   Final    NO GROWTH 5 DAYS Performed at [facility], [address] [place], [place] [address]    Report Status 12/21/2020 FINAL  Final  Blood culture (routine x 2)     Status: None   Collection Time: 12/16/20  3:36 PM   Specimen: BLOOD  Result Value Ref Range Status   Specimen Description  BLOOD BLOOD LEFT FOREARM  Final   Special Requests   Final    BOTTLES DRAWN AEROBIC AND ANAEROBIC Blood Culture adequate volume   Culture   Final    NO GROWTH 5 DAYS Performed at [facility], [address] [place], [place] [address]    Report Status 12/21/2020 FINAL  Final  SARS CORONAVIRUS 2 (TAT 6-24 HRS) Nasopharyngeal Nasopharyngeal Swab     Status: None   Collection Time: 12/17/20 10:23 AM   Specimen: Nasopharyngeal Swab  Result Value Ref Range Status   SARS Coronavirus 2 NEGATIVE NEGATIVE Final    Comment: (NOTE) SARS-CoV-2 target nucleic acids are NOT DETECTED.  The SARS-CoV-2 RNA is generally detectable in upper and lower respiratory specimens during the acute  phase of infection. Negative results do not preclude SARS-CoV-2 infection, do not rule out co-infections with other pathogens, and should not be used as the sole basis for treatment or other patient management decisions. Negative results must be combined with clinical observations, patient history, and epidemiological information. The expected result is Negative.  Fact Sheet for Patients: SugarRoll.be  Fact Sheet for Healthcare Providers: https://www.woods-mathews.com/  This test is not yet approved or cleared by the [place] FDA and  has been authorized for detection and/or diagnosis of SARS-CoV-2 by FDA under an Emergency Use Authorization (EUA). This EUA will remain  in effect (meaning this test can be used) for the duration of the COVID-19 declaration under Se ction 564(b)(1) of the Act, 21 U.S.C. section 360bbb-3(b)(1), unless the authorization is terminated or revoked sooner.  Performed at [facility], [place] [address], [place], [place] [address]     Coagulation Studies: No results for input(s): LABPROT, INR in the last 72 hours.  Urinalysis: No results for input(s): COLORURINE, LABSPEC, PHURINE, GLUCOSEU, HGBUR, BILIRUBINUR, KETONESUR, PROTEINUR, UROBILINOGEN, NITRITE, LEUKOCYTESUR in the last 72 hours.  Invalid input(s): APPERANCEUR    Imaging: DG Abd 1 View  Result Date: 12/21/2020 CLINICAL DATA:  NG tube placement EXAM: ABDOMEN - 1 VIEW COMPARISON:  Radiograph 12/21/2020 FINDINGS: Interval placement of a transesophageal tube with the tip and side port distal to the GE junction, terminating near the level of the gastric antrum. Decompression of the previously seen gastric distension. Few persistently air filled loops of small bowel are seen in the upper abdomen. Redemonstration of a pigtail pleural drain terminating in the right upper quadrant. Scarring and/or atelectasis in the bilateral lung bases. Calcified pleural  plaque along the left base. Stable cardiomediastinal contours multilevel degenerative changes are present in the imaged portions of the spine. No acute osseous abnormality or suspicious osseous lesion. IMPRESSION: Interval placement of a transesophageal tube with the tip and side port distal to the GE junction, terminating near the level of the gastric antrum. Decompression of the previously seen gastric distension. Electronically Signed   By: Lovena Le M.D.   On: 12/21/2020 02:43   DG Abd 1 View  Result Date: 12/21/2020 CLINICAL DATA:  Abdominal distension EXAM: ABDOMEN - 1 VIEW COMPARISON:  12/20/2020 FINDINGS: Increasing gaseous distention of the stomach when compared to recent radiographs. Additional loops of air-filled small bowel are seen throughout the mid abdomen. Large colonic stool burden is noted as well with fecal material throughout the colon and rectal vault. Vascular calcifications project over the abdomen and pelvis. Prostate brachytherapy implants are noted. The osseous structures appear diffusely demineralized which may limit detection of small or nondisplaced fractures. No acute osseous abnormality or suspicious osseous lesion. Atelectatic changes in the  lung bases. Pigtail catheter terminates in the right upper quadrant. IMPRESSION: 1. Increasing gaseous distention of the stomach when compared to recent radiographs, consider nasogastric decompression. 2. Additional air-filled loops of small bowel are clustered in the mid abdomen, favor ileus given lack of distal decompression with a large colonic stool burden throughout the colon and rectal vault. 3. Pigtail catheter remains in the right upper quadrant. Electronically Signed   By: Lovena Le M.D.   On: 12/21/2020 01:36   DG Abd 2 Views  Result Date: 12/20/2020 CLINICAL DATA:  Weakness, nausea, vomiting. EXAM: ABDOMEN - 2 VIEW COMPARISON:  CT abdomen pelvis dated 12/16/2020 and abdomen radiograph dated 12/08/2020. FINDINGS: Multiple  gas-filled loops of small bowel are seen in the mid abdomen without air-fluid levels. The amount of gas-filled loops as increased since 12/08/2020. Stool is seen in the colon and rectum. There is no evidence of free air. No radio-opaque calculi or other significant radiographic abnormality is seen. A pigtail catheter overlies the right upper quadrant. IMPRESSION: Mild gaseous distention of small bowel without air-fluid levels. This may reflect ileus or early small bowel obstruction. Electronically Signed   By: Zerita Boers M.D.   On: 12/20/2020 15:06   ECHOCARDIOGRAM COMPLETE  Result Date: 12/20/2020    ECHOCARDIOGRAM REPORT   Patient Name:   Lonnie [name]. Date of Exam: 12/19/2020 Medical Rec #:  [id number]          Height:       72.0 in Accession #:    [id number]         Weight:       183.9 lb Date of Birth:  [birth year]           BSA:          2.056 m Patient Age:    28 years           BP:           129/50 mmHg Patient Gender: M                  HR:           84 bpm. Exam Location:  [facility] Procedure: 2D Echo Indications:     Dyspnea R06.00  History:         Patient has prior history of Echocardiogram examinations, most                  recent 07/16/2018.  Sonographer:     Arville Go RDCS Referring Phys:  [id number] Loletha Grayer Diagnosing Phys: Serafina Royals MD  Sonographer Comments: Suboptimal parasternal window. Image acquisition challenging due to respiratory motion. IMPRESSIONS  1. Left ventricular ejection fraction, by estimation, is 60 to 65%. The left ventricle has normal function. The left ventricle has no regional wall motion abnormalities. Left ventricular diastolic function could not be evaluated.  2. Right ventricular systolic function is normal. The right ventricular size is normal.  3. The mitral valve is normal in structure. Mild mitral valve regurgitation.  4. The aortic valve is normal in structure. Aortic valve regurgitation is not visualized. FINDINGS  Left Ventricle: Left ventricular  ejection fraction, by estimation, is 60 to 65%. The left ventricle has normal function. The left ventricle has no regional wall motion abnormalities. The left ventricular internal cavity size was small. There is no left ventricular hypertrophy. Left ventricular diastolic function could not be evaluated. Right Ventricle: The right ventricular size is normal. No increase in right ventricular wall thickness. Right ventricular  systolic function is normal. Left Atrium: Left atrial size was normal in size. Right Atrium: Right atrial size was normal in size. Pericardium: There is no evidence of pericardial effusion. Mitral Valve: The mitral valve is normal in structure. Mild mitral valve regurgitation. Tricuspid Valve: The tricuspid valve is normal in structure. Tricuspid valve regurgitation is mild. Aortic Valve: The aortic valve is normal in structure. Aortic valve regurgitation is not visualized. Aortic valve peak gradient measures 16.3 mmHg. Pulmonic Valve: The pulmonic valve was normal in structure. Pulmonic valve regurgitation is not visualized. Aorta: The aortic root and ascending aorta are structurally normal, with no evidence of dilitation. IAS/Shunts: No atrial level shunt detected by color flow Doppler.  LEFT VENTRICLE PLAX 2D LVIDd:         3.24 cm  Diastology LVIDs:         2.21 cm  LV e' medial:    6.96 cm/s LV PW:         1.55 cm  LV E/e' medial:  16.8 LV IVS:        1.31 cm  LV e' lateral:   9.57 cm/s LVOT diam:     2.20 cm  LV E/e' lateral: 12.2 LV SV:         84 LV SV Index:   41 LVOT Area:     3.80 cm  RIGHT VENTRICLE RV Basal diam:  2.84 cm RV S prime:     9.57 cm/s TAPSE (M-mode): 2.4 cm LEFT ATRIUM             Index       RIGHT ATRIUM           Index LA diam:        3.30 cm 1.61 cm/m  RA Area:     15.70 cm LA Vol (A2C):   27.1 ml 13.18 ml/m RA Volume:   47.20 ml  22.96 ml/m LA Vol (A4C):   31.0 ml 15.08 ml/m LA Biplane Vol: 31.4 ml 15.27 ml/m  AORTIC VALVE AV Area (Vmax): 2.26 cm AV Vmax:         202.00 cm/s AV Peak Grad:   16.3 mmHg LVOT Vmax:      120.00 cm/s LVOT Vmean:     72.800 cm/s LVOT VTI:       0.222 m  AORTA Ao Root diam: 3.00 cm MITRAL VALVE                TRICUSPID VALVE MV Area (PHT): 2.76 cm     TV Peak grad:   30.9 mmHg MV Decel Time: 275 msec     TV Vmax:        2.78 m/s MV E velocity: 117.00 cm/s MV A velocity: 141.00 cm/s  SHUNTS MV E/A ratio:  0.83         Systemic VTI:  0.22 m                             Systemic Diam: 2.20 cm Serafina Royals MD Electronically signed by Serafina Royals MD Signature Date/Time: 12/20/2020/6:38:28 AM    Final      Medications:   . sodium chloride 40 mL/hr at 12/21/20 1856  . ampicillin-sulbactam (UNASYN) IV Stopped (12/21/20 0557)  . fluconazole (DIFLUCAN) IV 25 mL/hr at 12/21/20 0651   . alfuzosin  10 mg Oral Q breakfast  . allopurinol  50 mg Oral q morning  . vitamin C  250 mg Oral  BID  . aspirin EC  81 mg Oral Daily  . atorvastatin  40 mg Oral q morning  . Chlorhexidine Gluconate Cloth  6 each Topical Daily  . enoxaparin (LOVENOX) injection  30 mg Subcutaneous Q24H  . feeding supplement  237 mL Oral BID BM  . ferrous sulfate  325 mg Oral BID WC  . finasteride  5 mg Oral BH-q7a  . gabapentin  100 mg Oral BID  . Ipratropium-Albuterol  2 puff Inhalation QID  . LORazepam  1 mg Intravenous Once  . magnesium oxide  400 mg Oral q morning  . melatonin  5 mg Oral QHS  . mometasone-formoterol  2 puff Inhalation BID  . NIFEdipine  60 mg Oral q morning  . nystatin   Topical TID  . omega-3 acid ethyl esters  1 g Oral Daily  . pantoprazole  40 mg Oral Daily  . senna-docusate  2 tablet Oral Daily  . sodium phosphate  1 enema Rectal BID  . vitamin B-12  1,000 mcg Oral Daily   bisacodyl, lactulose, morphine injection, ondansetron **OR** ondansetron (ZOFRAN) IV  Assessment/ Plan:  Mr. Owais Pruett. is a 87 y.o.  male presented to the ER from home by EMS for weakness.  He recently had a cholecystostomy drain placed He required  In-N-Out cath and 700 cc of urine was obtained Foley catheter was then inserted He also has intra-abdominal infection/abscess with enterococcal PCM.  Treated with Unasyn.  He was evaluated by general surgeon-gallbladders was thought to be well decompressed.  No bowel obstruction or gaseous distention.  No surgical intervention recommended.  #Acute Kidney Injury  Baseline creatinine appears to be 1.43/GFR 46 from December 08, 2020 Acute kidney injury secondary to urinary retention, IV contrast exposure on March 8 and concurrent illness likely causing ATN Lab Results  Component Value Date   CREATININE 1.85 (H) 12/21/2020   CREATININE 2.01 (H) 12/20/2020   CREATININE 2.15 (H) 12/19/2020  ` Creatinine continues to improve 1.85 Urine output adequate Will continue to monitor  # Acute Urinary retention  Foley catheter in place  Alfuzosin 10mg  and Finasteride 5 mg  daily for BPH  Colon Flattery , MD [facility] 3/21/202210:31 AM     LOS: 5 Shantelle Breeze 3/21/202210:31 AM

## 2020-12-21 NOTE — Plan of Care (Signed)
  Problem: Clinical Measurements: Goal: Diagnostic test results will improve Outcome: Not Progressing   Problem: Nutrition: Goal: Adequate nutrition will be maintained Outcome: Not Progressing   Pt abdomen was distended and KUB showing need for decompression. NGT to right nares placed and abd x-ray confirmed placement, showed  Decompression of the previously seen gastric distension. Pt stated feeling much better. Resting well now.

## 2020-12-21 NOTE — Progress Notes (Addendum)
    BRIEF OVERNIGHT PROGRESS REPORT   SUBJECTIVE: Patient noted with increasing abdominal distension associated with nausea and vomiting  OBJECTIVE: he was afebrile with blood pressure 132/58 mm Hg and pulse rate 88 beats/min. Abdomen distended with decreased bowel sounds.  ASSESSMENT & PLAN:  Abdominal Distension/ Obstipation - Concerns for ileus versus small bowel obstruction. Repeat KUB shows increasing gaseous distension and additional air-filled loops of small bowel with large colonic stool burden concerning for ileus. - Continue aggressive IVFs - Keep NPO - NGT for decompression. Will monitor if bloating improves, starts to pass gas, and for  bowel movement, - Close monitoring for severe pain or signs of infection indicating ischemia - PRN antiemetics - Consider General Surgery Consult if symptoms suggest obstruction     Rufina Falco DNP, FNP-BC, AGACNP-BC Acute Care Nurse Practitioner [facility]

## 2020-12-21 NOTE — TOC Progression Note (Signed)
Transition of Care (TOC) - Progression Note    Patient Details  Name: Lonnie [name]. MRN: [id number] Date of Birth: 1927-05-21  Transition of Care [facility] CM/SW Contact  Shelbie Hutching, RN Phone Number: 12/21/2020, 10:44 AM  Clinical Narrative:    Patient is not medically stable at this time, currently has NGT to suction, concern for SBO.   [facility] is able to offer a short term rehab bed when patient is medically stable.  Patient and daughter confirm that he has had all 3 COVID vaccinations.   RNCM updated daughter that [facility] can accept patient.  RNCM will keep [facility] updated on when patient may be medically stable for discharge.    Expected Discharge Plan: [place] Barriers to Discharge: Continued Medical Work up  Expected Discharge Plan and Services Expected Discharge Plan: [place]   Discharge Planning Services: CM Consult Post Acute Care Choice: [place] Living arrangements for the past 2 months: Single Family Home                 DME Arranged: N/A DME Agency: NA       HH Arranged: NA HH Agency: NA         Social Determinants of Health (SDOH) Interventions    Readmission Risk Interventions Readmission Risk Prevention Plan 12/18/2020  Transportation Screening Complete  PCP or Specialist Appt within 3-5 Days Complete  HRI or [place] Complete  Social Work Consult for [place] Planning/Counseling Complete  Palliative Care Screening Not Applicable  Medication Review [employer]) Complete  Some recent data might be hidden

## 2020-12-21 NOTE — Consult Note (Signed)
Consultation Note Date: 12/21/2020   Patient Name: Lonnie [name].  DOB: 1927-02-06  MRN: [id number]  Age / Sex: 87 y.o., male  PCP: Derinda Late, MD Referring Physician: Loletha Grayer, MD  Reason for Consultation: Establishing goals of care and Psychosocial/spiritual support  HPI/Patient Profile: 87 y.o. male  with past medical history of acute cholecystitis sp perc cholecystostomy, HTN, gout, basal cell carcinoma sp skin excision 12/01/20 admitted on 12/16/2020 with acute cholecystitis status post [facility] cholecystostomy.   Clinical Assessment and Goals of Care: I have reviewed medical records including EPIC notes, labs and imaging, received report from transition of care team, examined the patient and met at bedside with Lonnie [name] to discuss diagnosis prognosis, Ashley, EOL wishes, disposition and options.  Lonnie [name] is lying quietly in bed with nephrology NP at bedside.  He greets me making and mostly keeping eye contact.  He is alert and oriented, able to make his basic needs known.  He keeps stating, "ice".  He is able to make his basic needs known.  There is no family at bedside at this time.  I introduced Palliative Medicine as specialized medical care for people living with serious illness. It focuses on providing relief from the symptoms and stress of a serious illness.   We discussed current illness and what it means in the larger context of on-going co-morbidities.  Natural disease trajectory and expectations at EOL were discussed.  We talked about his abdomen.  He denies pain or tenderness at this time.  We talked about small bowel obstruction and NG tube.  I encourage Lonnie [name] to keep moving his legs.  He is able to easily do heel slides in the bed.  He is agreeable to get up to the bedside chair today if possible.  I attempted to elicit values and goals of care important to the patient.     Advanced directives, concepts specific to code status, were considered and discussed.  Lonnie [name] continues to state that he would want full scope/full code.  Questions and concerns were addressed.  The family was encouraged to call with questions or concerns.   Conference with attending, transition of care team related to patient condition, needs, goals of care.  HCPOA   NEXT OF KIN -Lonnie [name] names his daughter, Lonnie [name], as his healthcare surrogate.    SUMMARY OF RECOMMENDATIONS   Continue full scope/full code Increase mobility as possible Short-term rehab when medically stable  Code Status/Advance Care Planning:  Full code -we discussed "treat the treatable, but allowing natural passing".  Lonnie [name] elects full scope/full code  Symptom Management:   Per hospitalist, no additional needs at this time.  Palliative Prophylaxis:   Frequent Pain Assessment and Oral Care  Additional Recommendations (Limitations, Scope, Preferences):  Full Scope Treatment  Psycho-social/Spiritual:   Desire for further Chaplaincy support:no  Additional Recommendations: Caregiving  Support/Resources  Prognosis:   Unable to determine, based on outcomes.  6 months or more would not be surprising based on functional status, chronic illness burden  Discharge Planning: To be determined, anticipate short-term rehab      Primary Diagnoses: Present on Admission: . Dehydration . Stage 3 chronic kidney disease ([place]) . Acute cholecystitis   I have reviewed the medical record, interviewed the patient and family, and examined the patient. The following aspects are pertinent.  Past Medical History:  Diagnosis Date  . Anemia   . Basal cell carcinoma 11/13/2009   Right alar groove  . Basal cell carcinoma 04/28/2010   left preauricular, BCC with focal sclerosis. Exc. 06/04/2010  . Basal cell carcinoma 04/24/2013   right post auricular  . Basal cell carcinoma 10/26/2017   right inferior  cheek above mandible, Superficial  . Basal cell carcinoma 03/22/2018   right post. base of neck  . Basal cell carcinoma 05/08/2018   post. neck, spinal, Superficial. EDC 05/08/2018  . Basal cell carcinoma 07/23/2018   left mid supraclavicular, Superficial and nodular pattern. EDC  . Basal cell carcinoma 07/23/2018   right superior chest parasternal. Superficial and nodular pattern. EDC  . Basal cell carcinoma 05/16/2019   left lateral neck. Superficial and nodular pattern. EDC  . Basal cell carcinoma 11/07/2019   left lateral forehead. Nodular. EDC  . Basal cell carcinoma 12/23/2019   Left ala nasi. BCC with sclerosis.  . Basal cell carcinoma 11/19/2020   L ear post aspect,excised 12/01/20  . Bundle branch block, left   . CKD (chronic kidney disease) stage 3, GFR 30-59 ml/min (HCC)   . COPD (chronic obstructive pulmonary disease) ([place])   . Coronary artery disease   . Family history of adverse reaction to anesthesia    DAUGHTER-N/V  . GERD (gastroesophageal reflux disease)   . History of asbestosis   . HLD (hyperlipidemia)   . Hypertension   . Pneumonia 2017  . PSA elevation    Social History   Socioeconomic History  . Marital status: Widowed    Spouse name: Not on file  . Number of children: Not on file  . Years of education: Not on file  . Highest education level: Not on file  Occupational History  . Not on file  Tobacco Use  . Smoking status: Former Smoker    Packs/day: 2.00    Years: 50.00    Pack years: 100.00    Types: Cigarettes    Quit date: 09/24/1989    Years since quitting: 31.2  . Smokeless tobacco: Never Used  Vaping Use  . Vaping Use: Never used  Substance and Sexual Activity  . Alcohol use: Yes    Alcohol/week: 0.0 standard drinks    Comment: OCC RUM AND COKE  . Drug use: No  . Sexual activity: Not Currently  Other Topics Concern  . Not on file  Social History Narrative  . Not on file   Social Determinants of Health   Financial Resource  Strain: Not on file  Food Insecurity: Not on file  Transportation Needs: Not on file  Physical Activity: Not on file  Stress: Not on file  Social Connections: Not on file   Family History  Problem Relation Age of Onset  . Heart attack Mother   . Heart attack Father    Scheduled Meds: . alfuzosin  10 mg Oral Q breakfast  . allopurinol  50 mg Oral q morning  . vitamin C  250 mg Oral BID  . aspirin EC  81 mg Oral Daily  . atorvastatin  40 mg Oral q morning  . Chlorhexidine Gluconate Cloth  6 each Topical Daily  .  enoxaparin (LOVENOX) injection  30 mg Subcutaneous Q24H  . feeding supplement  237 mL Oral BID BM  . ferrous sulfate  325 mg Oral BID WC  . finasteride  5 mg Oral BH-q7a  . gabapentin  100 mg Oral BID  . Ipratropium-Albuterol  2 puff Inhalation QID  . LORazepam  1 mg Intravenous Once  . magnesium oxide  400 mg Oral q morning  . melatonin  5 mg Oral QHS  . mometasone-formoterol  2 puff Inhalation BID  . NIFEdipine  60 mg Oral q morning  . nystatin   Topical TID  . omega-3 acid ethyl esters  1 g Oral Daily  . pantoprazole (PROTONIX) IV  40 mg Intravenous Q24H  . senna-docusate  2 tablet Oral Daily  . sodium phosphate  1 enema Rectal BID  . vitamin B-12  1,000 mcg Oral Daily   Continuous Infusions: . sodium chloride 40 mL/hr at 12/21/20 0651  . ampicillin-sulbactam (UNASYN) IV Stopped (12/21/20 0557)  . fluconazole (DIFLUCAN) IV 25 mL/hr at 12/21/20 0651   PRN Meds:.bisacodyl, lactulose, morphine injection, ondansetron **OR** ondansetron (ZOFRAN) IV Medications Prior to Admission:  Prior to Admission medications   Medication Sig Start Date End Date Taking? Authorizing Provider  alfuzosin (UROXATRAL) 10 MG 24 hr tablet Take 10 mg by mouth daily with breakfast.   Yes [provider]  allopurinol (ZYLOPRIM) 300 MG tablet Take 300 mg by mouth every morning.   Yes [provider]  amoxicillin-clavulanate (AUGMENTIN) 500-125 MG tablet Take 1 tablet (500  mg total) by mouth 2 (two) times daily. 12/14/20  Yes Eulogio Bear U, DO  aspirin 81 MG tablet Take 81 mg by mouth daily.   Yes [provider]  atorvastatin (LIPITOR) 40 MG tablet Take 40 mg by mouth every morning.   Yes [provider]  bisacodyl (DULCOLAX) 5 MG EC tablet Take 10 mg by mouth 2 (two) times daily as needed. 09/18/20  Yes [provider]  budesonide-formoterol (SYMBICORT) 160-4.5 MCG/ACT inhaler Inhale 2 puffs into the lungs 2 (two) times daily.  01/31/18  Yes [provider]  ferrous sulfate 325 (65 FE) MG tablet Take 325 mg by mouth 2 (two) times daily with a meal.    Yes [provider]  finasteride (PROSCAR) 5 MG tablet Take 5 mg by mouth every morning.   Yes [provider]  gabapentin (NEURONTIN) 300 MG capsule Take 600 mg by mouth 2 (two) times daily.    Yes [provider]  Ipratropium-Albuterol (COMBIVENT RESPIMAT) 20-100 MCG/ACT AERS respimat Inhale 1 puff into the lungs every morning.   Yes [provider]  Magnesium Oxide 250 MG TABS Take 500 mg by mouth every morning.   Yes [provider]  Melatonin 5 MG TABS Take 5 mg by mouth at bedtime.    Yes [provider]  NIFEdipine (PROCARDIA-XL/NIFEDICAL-XL) 30 MG 24 hr tablet Take 60 mg by mouth every morning.   Yes [provider]  nystatin (MYCOSTATIN/NYSTOP) powder Apply topically 3 (three) times daily. 12/14/20  Yes Vann, Jessica U, DO  Omega-3 1000 MG CAPS Take 1,000 mg by mouth daily.   Yes [provider]  omeprazole (PRILOSEC) 20 MG capsule Take 20 mg by mouth 2 (two) times daily.   Yes [provider]  sennosides-docusate sodium (SENOKOT-S) 8.6-50 MG tablet Take 2 tablets by mouth daily.   Yes [provider]  vitamin B-12 (CYANOCOBALAMIN) 1000 MCG tablet Take 1,000 mcg by mouth daily.   Yes  [provider]  vitamin C (ASCORBIC ACID) 250 MG tablet Take 250 mg by mouth 2 (two) times  daily.   Yes [provider]   Allergies  Allergen Reactions  . Sulfa Antibiotics Other (See Comments)    Not sure of reaction. It was too long ago, but he said not to give it to him.   . Sulfasalazine     Other reaction(s): Other (See Comments) Not sure of reaction. It was too long ago, but he said not to give it to him.   Marland Kitchen Penicillin G Rash    Tolerates ampicillin and amoxicillin   Review of Systems  Unable to perform ROS: Age    Physical Exam Vitals and nursing note reviewed.  Constitutional:      General: He is not in acute distress.    Appearance: He is ill-appearing.  HENT:     Head: Atraumatic.     Mouth/Throat:     Mouth: Mucous membranes are dry.  Cardiovascular:     Rate and Rhythm: Normal rate.  Pulmonary:     Effort: Pulmonary effort is normal. No respiratory distress.  Abdominal:     Tenderness: There is no abdominal tenderness. There is no guarding.  Musculoskeletal:        General: No swelling.  Skin:    General: Skin is warm and dry.  Neurological:     Mental Status: He is alert and oriented to person, place, and time.  Psychiatric:        Mood and Affect: Mood normal.        Behavior: Behavior normal.     Vital Signs: BP (!) 133/50 (BP Location: Right Arm)   Pulse 75   Temp 98.1 F (36.7 C)   Resp 16   Ht 6' (1.829 m)   Wt 83.4 kg   SpO2 100%   BMI 24.94 kg/m  Pain Scale: 0-10 POSS *See Group Information*: 1-Acceptable,Awake and alert Pain Score: 0-No pain   SpO2: SpO2: 100 % O2 Device:SpO2: 100 % O2 Flow Rate: .   IO: Intake/output summary:   Intake/Output Summary (Last 24 hours) at 12/21/2020 1156 Last data filed at 12/21/2020 1308 Gross per 24 hour  Intake 2362.69 ml  Output 2275 ml  Net 87.69 ml    LBM: Last BM Date: 12/20/20 Baseline Weight: Weight: 83 kg Most recent weight: Weight: 83.4 kg     Palliative Assessment/Data:   Flowsheet Rows   Flowsheet Row Most Recent Value  Intake Tab   Referral Department  Hospitalist  Unit at Time of Referral Med/Surg Unit  Palliative Care Primary Diagnosis Other (Comment)  [Weakness]  Date Notified 12/20/20  Palliative Care Type New Palliative care  Reason for referral Clarify Goals of Care  Date of Admission 12/16/20  Date first seen by Palliative Care 12/21/20  # of days Palliative referral response time 1 Day(s)  # of days IP prior to Palliative referral 4  Clinical Assessment   Palliative Performance Scale Score 40%  Pain Max last 24 hours Not able to report  Pain Min Last 24 hours Not able to report  Dyspnea Max Last 24 Hours Not able to report  Dyspnea Min Last 24 hours Not able to report  Psychosocial & Spiritual Assessment   Palliative Care Outcomes       Time In: 0940 Time Out: 1030 Time Total: 50 minutes Greater than 50%  of this time was spent counseling and coordinating care related to the above assessment and plan.  Signed by: Drue Novel, NP   Please contact Palliative Medicine Team phone at [phone] for questions and concerns.  For individual provider: See Shea Evans

## 2020-12-21 NOTE — Progress Notes (Signed)
OT Cancellation Note  Patient Details Name: Lonnie [name]. MRN: [id number] DOB: 1927/09/29   Cancelled Treatment:    Reason Eval/Treat Not Completed: Other (comment). OT attempted treatment but pt currently working with PT at this time. OT to follow up and re-attempt at next available time.   Darleen Crocker, [place], OTR/L , CBIS ascom [phone]  12/21/20, 4:28 PM   12/21/2020, 4:27 PM

## 2020-12-22 ENCOUNTER — Inpatient Hospital Stay: Payer: No Typology Code available for payment source

## 2020-12-22 DIAGNOSIS — Z7189 Other specified counseling: Secondary | ICD-10-CM | POA: Diagnosis not present

## 2020-12-22 DIAGNOSIS — R531 Weakness: Secondary | ICD-10-CM | POA: Diagnosis not present

## 2020-12-22 DIAGNOSIS — N179 Acute kidney failure, unspecified: Secondary | ICD-10-CM | POA: Diagnosis not present

## 2020-12-22 DIAGNOSIS — K567 Ileus, unspecified: Secondary | ICD-10-CM | POA: Diagnosis not present

## 2020-12-22 DIAGNOSIS — K5900 Constipation, unspecified: Secondary | ICD-10-CM | POA: Diagnosis not present

## 2020-12-22 DIAGNOSIS — K810 Acute cholecystitis: Secondary | ICD-10-CM | POA: Diagnosis not present

## 2020-12-22 LAB — CBC
HCT: 26.7 % — ABNORMAL LOW (ref 39.0–52.0)
Hemoglobin: 9.1 g/dL — ABNORMAL LOW (ref 13.0–17.0)
MCH: 33.1 pg (ref 26.0–34.0)
MCHC: 34.1 g/dL (ref 30.0–36.0)
MCV: 97.1 fL (ref 80.0–100.0)
Platelets: 272 10*3/uL (ref 150–400)
RBC: 2.75 MIL/uL — ABNORMAL LOW (ref 4.22–5.81)
RDW: 13.6 % (ref 11.5–15.5)
WBC: 10.4 10*3/uL (ref 4.0–10.5)
nRBC: 0 % (ref 0.0–0.2)

## 2020-12-22 LAB — COMPREHENSIVE METABOLIC PANEL
ALT: 27 U/L (ref 0–44)
AST: 23 U/L (ref 15–41)
Albumin: 2.5 g/dL — ABNORMAL LOW (ref 3.5–5.0)
Alkaline Phosphatase: 81 U/L (ref 38–126)
Anion gap: 5 (ref 5–15)
BUN: 49 mg/dL — ABNORMAL HIGH (ref 8–23)
CO2: 17 mmol/L — ABNORMAL LOW (ref 22–32)
Calcium: 8.6 mg/dL — ABNORMAL LOW (ref 8.9–10.3)
Chloride: 121 mmol/L — ABNORMAL HIGH (ref 98–111)
Creatinine, Ser: 1.59 mg/dL — ABNORMAL HIGH (ref 0.61–1.24)
GFR, Estimated: 40 mL/min — ABNORMAL LOW (ref >60)
Glucose, Bld: 74 mg/dL (ref 70–99)
Potassium: 3.9 mmol/L (ref 3.5–5.1)
Sodium: 143 mmol/L (ref 135–145)
Total Bilirubin: 0.7 mg/dL (ref 0.3–1.2)
Total Protein: 5.9 g/dL — ABNORMAL LOW (ref 6.5–8.1)

## 2020-12-22 LAB — LIPASE, BLOOD: Lipase: 88 U/L — ABNORMAL HIGH (ref 11–51)

## 2020-12-22 MED ORDER — ACETAMINOPHEN 325 MG PO TABS
650.0000 mg | ORAL_TABLET | Freq: Four times a day (QID) | ORAL | Status: DC | PRN
  Administered 2020-12-22 – 2020-12-23 (×3): 650 mg via ORAL
  Filled 2020-12-22 (×3): qty 2

## 2020-12-22 MED ORDER — POLYETHYLENE GLYCOL 3350 17 G PO PACK
17.0000 g | PACK | Freq: Every day | ORAL | Status: DC
  Administered 2020-12-22 – 2020-12-24 (×3): 17 g via ORAL
  Filled 2020-12-22 (×2): qty 1

## 2020-12-22 MED ORDER — ADULT MULTIVITAMIN W/MINERALS CH
1.0000 | ORAL_TABLET | Freq: Every day | ORAL | Status: DC
  Administered 2020-12-23 – 2020-12-24 (×2): 1 via ORAL
  Filled 2020-12-22 (×2): qty 1

## 2020-12-22 MED ORDER — SODIUM CHLORIDE 0.9 % IV SOLN
3.0000 g | Freq: Three times a day (TID) | INTRAVENOUS | Status: AC
  Administered 2020-12-22 – 2020-12-23 (×5): 3 g via INTRAVENOUS
  Filled 2020-12-22 (×5): qty 3

## 2020-12-22 MED ORDER — ENOXAPARIN SODIUM 40 MG/0.4ML ~~LOC~~ SOLN
40.0000 mg | SUBCUTANEOUS | Status: DC
  Administered 2020-12-22 – 2020-12-23 (×2): 40 mg via SUBCUTANEOUS
  Filled 2020-12-22 (×2): qty 0.4

## 2020-12-22 MED ORDER — ENSURE MAX PROTEIN PO LIQD
11.0000 [oz_av] | Freq: Two times a day (BID) | ORAL | Status: DC
  Administered 2020-12-22 – 2020-12-24 (×4): 11 [oz_av] via ORAL
  Filled 2020-12-22: qty 330

## 2020-12-22 MED ORDER — FLUCONAZOLE 50 MG PO TABS
50.0000 mg | ORAL_TABLET | Freq: Every day | ORAL | Status: DC
  Administered 2020-12-22 – 2020-12-23 (×2): 50 mg via ORAL
  Filled 2020-12-22 (×3): qty 1

## 2020-12-22 MED ORDER — PANTOPRAZOLE SODIUM 40 MG PO TBEC
40.0000 mg | DELAYED_RELEASE_TABLET | Freq: Every day | ORAL | Status: DC
  Administered 2020-12-22 – 2020-12-24 (×3): 40 mg via ORAL
  Filled 2020-12-22 (×2): qty 1

## 2020-12-22 MED ORDER — MENTHOL 3 MG MT LOZG
1.0000 | LOZENGE | OROMUCOSAL | Status: DC | PRN
  Filled 2020-12-22: qty 9

## 2020-12-22 NOTE — Consult Note (Addendum)
Pharmacy Antibiotic Note  Lonnie [name] Lonnie [name]. is a 87 y.o. male admitted on 12/16/2020 with intra-abdominal infection.  Pharmacy has been consulted for Unasyn dosing. Pt was discharged on augmentin 3/14 and returned due to feeling weak, abdomen is distended and complains of productive cough. Abscess culture growing RARE ENTEROCOCCUS FAECIUM.   Plan: Day 11 of total abx from previous admission.  Renal function continuing to improve -  Will change to Unasyn 3 g q8H per current renal function.   Height: 6' (182.9 cm) Weight: 83.4 kg (183 lb 13.8 oz) IBW/kg (Calculated) : 77.6  Temp (24hrs), Avg:98.2 F (36.8 C), Min:97.7 F (36.5 C), Max:98.5 F (36.9 C)  Recent Labs  Lab 12/16/20 1535 12/16/20 2030 12/17/20 0504 12/18/20 0732 12/19/20 0723 12/20/20 0813 12/21/20 0615 12/22/20 0444  WBC  --   --  19.0* 16.0* 14.8*  --  10.8* 10.4  CREATININE  --    < > 2.62* 2.59* 2.15* 2.01* 1.85* 1.59*  LATICACIDVEN 1.3  --   --   --   --   --   --   --    < > = values in this interval not displayed.    Estimated Creatinine Clearance: 32.5 mL/min (A) (by C-G formula based on SCr of 1.59 mg/dL (H)).    Allergies  Allergen Reactions  . Sulfa Antibiotics Other (See Comments)    Not sure of reaction. It was too long ago, but he said not to give it to him.   . Sulfasalazine     Other reaction(s): Other (See Comments) Not sure of reaction. It was too long ago, but he said not to give it to him.   Marland Kitchen Penicillin G Rash    Tolerates ampicillin and amoxicillin    Antimicrobials this admission: 3/8 ceftriaxone + flagyl >> 3/11 3/11 linezolid >> 3/13  3/12 Unasyn >> 3/13  3/14 Augmentin x 1  3/16 Unasyn >  Dose adjustments this admission: None  Microbiology results: 3/16 BCx: NG2D 3/9 Abscess cx: RARE ENTEROCOCCUS FAECIUM (pan-sensitive)   3/8 MRSA PCR: negative  Thank you for allowing pharmacy to be a part of this patient's care.  Lu Duffel, PharmD, BCPS Clinical  Pharmacist 12/22/2020 8:17 AM

## 2020-12-22 NOTE — Progress Notes (Signed)
PHARMACIST - PHYSICIAN COMMUNICATION  DR:   Leslye Peer  CONCERNING: IV to Oral Route Change Policy  RECOMMENDATION: This patient is receiving pantoprazole by the intravenous route.  Based on criteria approved by the Pharmacy and Therapeutics Committee, the intravenous medication(s) is/are being converted to the equivalent oral dose form(s).   DESCRIPTION: These criteria include:  The patient is eating (either orally or via tube) and/or has been taking other orally administered medications for a least 24 hours  The patient has no evidence of active gastrointestinal bleeding or impaired GI absorption (gastrectomy, short bowel, patient on TNA or NPO).  If you have questions about this conversion, please contact the Pharmacy Department  []   [phone] )  [facility] [x]   [phone] )  [facility] []   [phone] )  [facility] []   [phone] )  [facility] []   [phone] )  [place], PharmD, BCPS Clinical Pharmacist 12/22/2020 9:12 AM

## 2020-12-22 NOTE — Progress Notes (Signed)
Patient ID: Lonnie [name]., male   DOB: 29-Apr-1927, 87 y.o.   MRN: [id number]     [place] [facility] Day(s): 6.   Post op day(s):  Marland Kitchen   Interval History: Patient seen and examined.  Patient was asleep but easily arousable.  He did not complain of nausea or vomiting.  He report he had a bowel movement yesterday.  He does seems uncomfortable mostly because of cough.  Denies abdominal pain.  There is no pain radiation.  There is no alleviating or aggravating factor.  Patient tried clear liquids last night.  Vital signs in last 24 hours: [min-max] current  Temp:  [97.7 F (36.5 C)-98.5 F (36.9 C)] 97.7 F (36.5 C) (03/22 0605) Pulse Rate:  [73-88] 83 (03/22 0605) Resp:  [16-20] 20 (03/22 0605) BP: (133-155)/(50-63) 155/63 (03/22 0605) SpO2:  [95 %-100 %] 97 % (03/22 0605)     Height: 6' (182.9 cm) Weight: 83.4 kg BMI (Calculated): 24.93   Physical Exam:  Constitutional: alert, cooperative and no distress  Respiratory: breathing non-labored at rest  Cardiovascular: regular rate and sinus rhythm  Gastrointestinal: soft, non-tender, and mild distended  Labs:  CBC Latest Ref Rng & Units 12/22/2020 12/21/2020 12/19/2020  WBC 4.0 - 10.5 K/uL 10.4 10.8(H) 14.8(H)  Hemoglobin 13.0 - 17.0 g/dL 9.1(L) 8.2(L) 9.1(L)  Hematocrit 39.0 - 52.0 % 26.7(L) 25.2(L) 26.4(L)  Platelets 150 - 400 K/uL 272 238 212   CMP Latest Ref Rng & Units 12/22/2020 12/21/2020 12/20/2020  Glucose 70 - 99 mg/dL 74 104(H) 120(H)  BUN 8 - 23 mg/dL 49(H) 55(H) 54(H)  Creatinine 0.61 - 1.24 mg/dL 1.59(H) 1.85(H) 2.01(H)  Sodium 135 - 145 mmol/L 143 142 140  Potassium 3.5 - 5.1 mmol/L 3.9 4.1 4.6  Chloride 98 - 111 mmol/L 121(H) 120(H) 118(H)  CO2 22 - 32 mmol/L 17(L) 16(L) 15(L)  Calcium 8.9 - 10.3 mg/dL 8.6(L) 8.6(L) 8.6(L)  Total Protein 6.5 - 8.1 g/dL 5.9(L) - -  Total Bilirubin 0.3 - 1.2 mg/dL 0.7 - -  Alkaline Phos 38 - 126 U/L 81 - -  AST 15 - 41 U/L 23 - -  ALT 0 - 44 U/L 27 - -    Imaging  studies: I personally evaluated the images.  There is no small bowel dilation.  There is no stomach dilation.  There is no obstructive bowel pattern.  There is still stool on his large intestine.   Assessment/Plan:  87 y.o.malewith ileus, generalized weakness, acute kidney injury (improving), recent cholecystitis resolved with percutaneous cholecystostomy, complicated by pertinent comorbidities includinghypertension, chronic kidney disease stage III, COPD, coronary tree disease, hyperlipidemia.  Patient today without sign of nausea or vomiting.  Patient had bowel movement after enema.  Patient tried clear liquids without nausea or vomiting.  Abdomen still mildly distended.  Since I just woke up the patient was difficult to assess how he was feeling regarding the abdomen distention or nausea.  He denies nausea or vomiting.  I will order abdominal x-ray for follow-up of his small bowel dilation.  We will also continue following clinically to make decision regarding removing the NG.  Continue aggressive bowel regimen for severe constipation.  Encourage patient to do physical therapy to improve his mobilization.  Continue medical management as per primary team.  I will continue to follow.  Arnold Long, MD

## 2020-12-22 NOTE — Progress Notes (Signed)
[facility]  ROUNDING NOTE   Subjective:   Patient resting in bed  Currently eating full liquid breakfast Denies nausea and shortness of breath NGT tube removed  UOP 2.9L  Objective:  Vital signs in last 24 hours:  Temp:  [97.7 F (36.5 C)-98.5 F (36.9 C)] 98.2 F (36.8 C) (03/22 0838) Pulse Rate:  [73-88] 84 (03/22 0838) Resp:  [16-20] 17 (03/22 0838) BP: (138-160)/(52-63) 160/60 (03/22 0838) SpO2:  [95 %-97 %] 96 % (03/22 0838)  Weight change:  Filed Weights   12/16/20 1116 12/17/20 1206  Weight: 83 kg 83.4 kg    Intake/Output: I/O last 3 completed shifts: In: 2915.5 [P.O.:1460; I.V.:905.3; Other:20; IV Piggyback:530.2] Out: 4132 [GMWNU:2725; Emesis/NG output:575; DGUYQI:347]   Intake/Output this shift:  No intake/output data recorded.  Physical Exam: General: In no acute distress, pleasant and appears comfortable  Head: Normocephalic, atraumatic  Lungs:  Respirations even, slightly labored with exertion, lungs clear  Heart: S1S2,+murmur  Abdomen:  Soft, nontender, non distended  Extremities:  No peripheral edema.  Neurologic: Able to answer simple questions  Skin: No acute  Lesions or rashes    Basic Metabolic Panel: Recent Labs  Lab 12/16/20 2030 12/17/20 0504 12/18/20 0732 12/19/20 0723 12/20/20 0813 12/21/20 0615 12/22/20 0444  NA 136   < > 138 137 140 142 143  K 4.3   < > 4.1 4.0 4.6 4.1 3.9  CL 113*   < > 117* 117* 118* 120* 121*  CO2 16*   < > 14* 14* 15* 16* 17*  GLUCOSE 109*   < > 101* 111* 120* 104* 74  BUN 57*   < > 58* 54* 54* 55* 49*  CREATININE 2.60*   < > 2.59* 2.15* 2.01* 1.85* 1.59*  CALCIUM 8.1*   < > 8.2* 8.3* 8.6* 8.6* 8.6*  MG 3.0*  --   --   --   --   --   --    < > = values in this interval not displayed.    Liver Function Tests: Recent Labs  Lab 12/16/20 1119 12/18/20 0732 12/22/20 0444  AST 26 20 23   ALT 22 20 27   ALKPHOS 62 60 81  BILITOT 0.6 0.7 0.7  PROT 6.3* 5.5* 5.9*  ALBUMIN 3.1* 2.5* 2.5*    Recent Labs  Lab 12/18/20 0732 12/19/20 0723 12/20/20 0813 12/21/20 0615 12/22/20 0444  LIPASE 74* 81* 95* 76* 88*   No results for input(s): AMMONIA in the last 168 hours.  CBC: Recent Labs  Lab 12/17/20 0504 12/18/20 0732 12/19/20 0723 12/21/20 0615 12/22/20 0444  WBC 19.0* 16.0* 14.8* 10.8* 10.4  HGB 9.4* 8.9* 9.1* 8.2* 9.1*  HCT 28.2* 27.0* 26.4* 25.2* 26.7*  MCV 100.7* 99.6 98.1 100.0 97.1  PLT 195 187 212 238 272    Cardiac Enzymes: No results for input(s): CKTOTAL, CKMB, CKMBINDEX, TROPONINI in the last 168 hours.  BNP: Invalid input(s): POCBNP  CBG: No results for input(s): GLUCAP in the last 168 hours.  Microbiology: Results for orders placed or performed during the hospital encounter of 12/16/20  Blood culture (routine x 2)     Status: None   Collection Time: 12/16/20  3:36 PM   Specimen: BLOOD  Result Value Ref Range Status   Specimen Description BLOOD LEFT ANTECUBITAL  Final   Special Requests   Final    BOTTLES DRAWN AEROBIC AND ANAEROBIC Blood Culture adequate volume   Culture   Final    NO GROWTH 5 DAYS Performed at  [facility], [address], [place], [place] [address]    Report Status 12/21/2020 FINAL  Final  Blood culture (routine x 2)     Status: None   Collection Time: 12/16/20  3:36 PM   Specimen: BLOOD  Result Value Ref Range Status   Specimen Description BLOOD BLOOD LEFT FOREARM  Final   Special Requests   Final    BOTTLES DRAWN AEROBIC AND ANAEROBIC Blood Culture adequate volume   Culture   Final    NO GROWTH 5 DAYS Performed at [facility], [address], [place], [place] [address]    Report Status 12/21/2020 FINAL  Final  SARS CORONAVIRUS 2 (TAT 6-24 HRS) Nasopharyngeal Nasopharyngeal Swab     Status: None   Collection Time: 12/17/20 10:23 AM   Specimen: Nasopharyngeal Swab  Result Value Ref Range Status   SARS Coronavirus 2 NEGATIVE NEGATIVE Final    Comment: (NOTE) SARS-CoV-2 target nucleic  acids are NOT DETECTED.  The SARS-CoV-2 RNA is generally detectable in upper and lower respiratory specimens during the acute phase of infection. Negative results do not preclude SARS-CoV-2 infection, do not rule out co-infections with other pathogens, and should not be used as the sole basis for treatment or other patient management decisions. Negative results must be combined with clinical observations, patient history, and epidemiological information. The expected result is Negative.  Fact Sheet for Patients: SugarRoll.be  Fact Sheet for Healthcare Providers: https://www.woods-mathews.com/  This test is not yet approved or cleared by the [place] FDA and  has been authorized for detection and/or diagnosis of SARS-CoV-2 by FDA under an Emergency Use Authorization (EUA). This EUA will remain  in effect (meaning this test can be used) for the duration of the COVID-19 declaration under Se ction 564(b)(1) of the Act, 21 U.S.C. section 360bbb-3(b)(1), unless the authorization is terminated or revoked sooner.  Performed at [facility], [place] [address], [place], [place] [address]     Coagulation Studies: No results for input(s): LABPROT, INR in the last 72 hours.  Urinalysis: No results for input(s): COLORURINE, LABSPEC, PHURINE, GLUCOSEU, HGBUR, BILIRUBINUR, KETONESUR, PROTEINUR, UROBILINOGEN, NITRITE, LEUKOCYTESUR in the last 72 hours.  Invalid input(s): APPERANCEUR    Imaging: DG Abd 1 View  Result Date: 12/21/2020 CLINICAL DATA:  NG tube placement EXAM: ABDOMEN - 1 VIEW COMPARISON:  Radiograph 12/21/2020 FINDINGS: Interval placement of a transesophageal tube with the tip and side port distal to the GE junction, terminating near the level of the gastric antrum. Decompression of the previously seen gastric distension. Few persistently air filled loops of small bowel are seen in the upper abdomen. Redemonstration of a pigtail  pleural drain terminating in the right upper quadrant. Scarring and/or atelectasis in the bilateral lung bases. Calcified pleural plaque along the left base. Stable cardiomediastinal contours multilevel degenerative changes are present in the imaged portions of the spine. No acute osseous abnormality or suspicious osseous lesion. IMPRESSION: Interval placement of a transesophageal tube with the tip and side port distal to the GE junction, terminating near the level of the gastric antrum. Decompression of the previously seen gastric distension. Electronically Signed   By: Lovena Le M.D.   On: 12/21/2020 02:43   DG Abd 1 View  Result Date: 12/21/2020 CLINICAL DATA:  Abdominal distension EXAM: ABDOMEN - 1 VIEW COMPARISON:  12/20/2020 FINDINGS: Increasing gaseous distention of the stomach when compared to recent radiographs. Additional loops of air-filled small bowel are seen throughout the mid abdomen. Large colonic stool burden  is noted as well with fecal material throughout the colon and rectal vault. Vascular calcifications project over the abdomen and pelvis. Prostate brachytherapy implants are noted. The osseous structures appear diffusely demineralized which may limit detection of small or nondisplaced fractures. No acute osseous abnormality or suspicious osseous lesion. Atelectatic changes in the lung bases. Pigtail catheter terminates in the right upper quadrant. IMPRESSION: 1. Increasing gaseous distention of the stomach when compared to recent radiographs, consider nasogastric decompression. 2. Additional air-filled loops of small bowel are clustered in the mid abdomen, favor ileus given lack of distal decompression with a large colonic stool burden throughout the colon and rectal vault. 3. Pigtail catheter remains in the right upper quadrant. Electronically Signed   By: Lovena Le M.D.   On: 12/21/2020 01:36   DG Abd 2 Views  Result Date: 12/22/2020 CLINICAL DATA:  Weakness.  Ileus. EXAM:  ABDOMEN - 2 VIEW COMPARISON:  12/21/2020. FINDINGS: NG tube noted with tip over the stomach. Right upper quadrant drainage catheter stable position. No bowel distention. Stool noted throughout the colon. Mild left base subsegmental atelectasis. Calcified pleural plaques again noted. Degenerative changes scoliosis lumbar spine. Degenerative changes both hips. Surgical clips in the pelvis. Aortoiliac and visceral atherosclerotic vascular calcification. IMPRESSION: 1. NG tube and right upper quadrant drainage catheter in stable position. 2. No bowel distention. Stool noted throughout the colon. Electronically Signed   By: Marcello Moores  Register   On: 12/22/2020 08:19   DG Abd 2 Views  Result Date: 12/20/2020 CLINICAL DATA:  Weakness, nausea, vomiting. EXAM: ABDOMEN - 2 VIEW COMPARISON:  CT abdomen pelvis dated 12/16/2020 and abdomen radiograph dated 12/08/2020. FINDINGS: Multiple gas-filled loops of small bowel are seen in the mid abdomen without air-fluid levels. The amount of gas-filled loops as increased since 12/08/2020. Stool is seen in the colon and rectum. There is no evidence of free air. No radio-opaque calculi or other significant radiographic abnormality is seen. A pigtail catheter overlies the right upper quadrant. IMPRESSION: Mild gaseous distention of small bowel without air-fluid levels. This may reflect ileus or early small bowel obstruction. Electronically Signed   By: Zerita Boers M.D.   On: 12/20/2020 15:06     Medications:   . sodium chloride 40 mL/hr at 12/21/20 1709  . ampicillin-sulbactam (UNASYN) IV    . fluconazole (DIFLUCAN) IV 25 mL/hr at 12/21/20 1709   . alfuzosin  10 mg Oral Q breakfast  . allopurinol  50 mg Oral q morning  . aspirin EC  81 mg Oral Daily  . atorvastatin  40 mg Oral q morning  . Chlorhexidine Gluconate Cloth  6 each Topical Daily  . enoxaparin (LOVENOX) injection  40 mg Subcutaneous Q24H  . feeding supplement  237 mL Oral BID BM  . finasteride  5 mg Oral  BH-q7a  . gabapentin  100 mg Oral BID  . Ipratropium-Albuterol  2 puff Inhalation QID  . LORazepam  1 mg Intravenous Once  . magnesium oxide  400 mg Oral q morning  . melatonin  5 mg Oral QHS  . mometasone-formoterol  2 puff Inhalation BID  . NIFEdipine  60 mg Oral q morning  . nystatin   Topical TID  . omega-3 acid ethyl esters  1 g Oral Daily  . pantoprazole (PROTONIX) IV  40 mg Intravenous Q24H  . senna-docusate  2 tablet Oral Daily  . sodium phosphate  1 enema Rectal BID  . vitamin B-12  1,000 mcg Oral Daily   bisacodyl, lactulose, morphine injection, ondansetron **  OR** ondansetron (ZOFRAN) IV  Assessment/ Plan:  Mr. Raidyn Wassink. is a 87 y.o.  male presented to the ER from home by EMS for weakness.  He recently had a cholecystostomy drain placed He required In-N-Out cath and 700 cc of urine was obtained Foley catheter was then inserted He also has intra-abdominal infection/abscess with enterococcal PCM.  Treated with Unasyn.  He was evaluated by general surgeon-gallbladders was thought to be well decompressed.  No bowel obstruction or gaseous distention.  No surgical intervention recommended.  #Acute Kidney Injury  Baseline creatinine appears to be 1.43/GFR 46 from December 08, 2020 Acute kidney injury secondary to urinary retention, IV contrast exposure on March 8 and concurrent illness likely causing ATN Lab Results  Component Value Date   CREATININE 1.59 (H) 12/22/2020   CREATININE 1.85 (H) 12/21/2020   CREATININE 2.01 (H) 12/20/2020  ` Creatinine today 1.59 Will continue to monitoring  # Acute Urinary retention  Foley catheter in place  Alfuzosin 10mg  and Finasteride 5 mg  daily  Colon Flattery , MD [facility] 3/22/20228:43 AM     LOS: 6 [place] 3/22/20228:43 AM

## 2020-12-22 NOTE — Progress Notes (Signed)
PHARMACIST - PHYSICIAN COMMUNICATION  CONCERNING:  Enoxaparin (Lovenox) for DVT Prophylaxis    RECOMMENDATION: Patient was prescribed enoxaprin 30mg  q24 hours for VTE prophylaxis.   Filed Weights   12/16/20 1116 12/17/20 1206  Weight: 83 kg (183 lb) 83.4 kg (183 lb 13.8 oz)    Body mass index is 24.94 kg/m.  Estimated Creatinine Clearance: 32.5 mL/min (A) (by C-G formula based on SCr of 1.59 mg/dL (H)).  Patient is candidate for enoxaparin 40mg  every 24 hours based on CrCl >80ml/min DESCRIPTION: Pharmacy has adjusted enoxaparin dose per [facility] policy.  Patient is now receiving enoxaparin 40 mg every 24 hours    Lu Duffel, PharmD, BCPS Clinical Pharmacist 12/22/2020 8:21 AM

## 2020-12-22 NOTE — Progress Notes (Signed)
PT Cancellation Note  Patient Details Name: Lonnie [name]. MRN: [id number] DOB: 07-28-1927   Cancelled Treatment:    Reason Eval/Treat Not Completed: Other (comment). Pt sleeping upon PT arrival. Able to wake to voice. Pt declined PT session this AM despite education and encouragement due to wanting to rest. Assisted with sip of water upon pt request. PT to re-attempt as able.   Lieutenant Diego PT, DPT 11:56 AM,12/22/20

## 2020-12-22 NOTE — Progress Notes (Signed)
Occupational Therapy Treatment Patient Details Name: Lonnie [name]. MRN: [id number] DOB: 12-Apr-1927 Today's Date: 12/22/2020    History of present illness Patient is a 87 year old male who was brought into the ER by EMS for evaluation of weakness and abdominal pain.  Patient was recently discharged from hospital following treatment for acute cholecystitis.  He is status post cholecystostomy tube placement and culture yielded Enterococcus faecium.  Patient was discharged home on Augmentin.  Labs show worsening leukocytosis with a left shift and normal lactic acid level.  Imaging shows gas /fluid-filled distended stomach.  He will be admitted to the hospital for further evaluation. PMH: anemia, L BBB, CKD stage 3, COPD, CAD, GERD, hx of asbestosis, HLD, HTN, penumonia, PSA elevation   OT comments  Upon entering the room, pt supine in bed and very softly spoken this session. Pt is agreeable to OT intervention but requesting bed level therapies this session. Pt washing face with set up A. Pt agreeable to B UE strengthening exercises. OT demonstrates use of level 1 theraband with pt returning demonstrations with min cuing for proper technique. Pt performing 2 sets of 10 chest pulls, bicep curls, and alternating punches. Pt needing multiple rest breaks secondary to fatigue this session. Pt very tired at this point and looks at therapist and says, " Life is a bitch and then you die". OT discussed what makes pt happy and he loves his dog named peaches. Pt discussed with pt hospital policy that family could bring dog with records from vet for visit. OT discussed with RN further. Pt remained supine at end of session with all needed items within reach. Bed alarm activated.    Follow Up Recommendations  SNF;Supervision/Assistance - 24 hour    Equipment Recommendations  Other (comment) (defer to next venue of care)       Precautions / Restrictions Precautions Precautions: Fall Precaution Comments: R side  drain Restrictions Weight Bearing Restrictions: No              ADL either performed or assessed with clinical judgement        Vision Wears Glasses: At all times            Cognition Arousal/Alertness: Awake/alert Behavior During Therapy: WFL for tasks assessed/performed Overall Cognitive Status: Within Functional Limits for tasks assessed                                                     Pertinent Vitals/ Pain       Pain Assessment: Faces Faces Pain Scale: Hurts a little bit Pain Location: with coughing Pain Descriptors / Indicators: Grimacing;Moaning Pain Intervention(s): Limited activity within patient's tolerance;Monitored during session;Repositioned         Frequency  Min 2X/week        Progress Toward Goals  OT Goals(current goals can now be found in the care plan section)  Progress towards OT goals: Progressing toward goals  Acute Rehab OT Goals Patient Stated Goal: none stated Time For Goal Achievement: 01/01/21 Potential to Achieve Goals: Good  Plan Discharge plan remains appropriate       AM-PAC OT "6 Clicks" Daily Activity     Outcome Measure   Help from another person eating meals?: A Little Help from another person taking care of personal grooming?: A Little Help from another person toileting, which includes  using toliet, bedpan, or urinal?: A Lot Help from another person bathing (including washing, rinsing, drying)?: A Lot Help from another person to put on and taking off regular upper body clothing?: A Little Help from another person to put on and taking off regular lower body clothing?: A Lot 6 Click Score: 15    End of Session    OT Visit Diagnosis: Unsteadiness on feet (R26.81);Muscle weakness (generalized) (M62.81)   Activity Tolerance Patient limited by fatigue   Patient Left in bed;with call bell/phone within reach;with bed alarm set   Nurse Communication Mobility status        Time:  8727-6184 OT Time Calculation (min): 26 min  Charges: OT General Charges $OT Visit: 1 Visit OT Treatments $Therapeutic Activity: 8-22 mins $Therapeutic Exercise: 8-22 mins  Darleen Crocker, MS, OTR/L , CBIS ascom [phone]  12/22/20, 2:28 PM

## 2020-12-22 NOTE — Progress Notes (Signed)
Palliative:  Mr. Hingle is lying quietly in bed with nursing staff at bedside attending to personal needs.  He will make and mostly keep eye contact.  He is oriented and able to make his needs known.  There is no family at bedside at this time.  He still does have a weak, quiet voice.  As needed medications adjusted.  We talked about his acute health problems.  In particular what is going on in his gut.  I encouraged him to accept a bowel regimen even though he does not feel that he needs to defecate.  I encouraged him to continue to increase his mobility.  Conference with attending, bedside nursing staff, transition of care team related to patient condition, needs, goals of care, disposition.  Plan:   Continue to treat the treatable.  Full scope/full code, unable to set time limits for life support.  Anticipate short-term rehab.  25 minutes  Quinn Axe, NP Palliative medicine team Team phone [phone] Greater than 50% of this time was spent counseling and coordinating care related to the above assessment and plan.

## 2020-12-22 NOTE — Progress Notes (Signed)
PHARMACIST - PHYSICIAN COMMUNICATION DR:  Leslye Peer CONCERNING: Antibiotic IV to Oral Route Change Policy  RECOMMENDATION: This patient is receiving fluconazole by the intravenous route.  Based on criteria approved by the Pharmacy and Therapeutics Committee, the antibiotic(s) is/are being converted to the equivalent oral dose form(s).   DESCRIPTION: These criteria include:  Patient being treated for a respiratory tract infection, urinary tract infection, cellulitis or clostridium difficile associated diarrhea if on metronidazole  The patient is not neutropenic and does not exhibit a GI malabsorption state  The patient is eating (either orally or via tube) and/or has been taking other orally administered medications for a least 24 hours  The patient is improving clinically and has a Tmax < 100.5  If you have questions about this conversion, please contact the [place], PharmD, BCPS Clinical Pharmacist 12/22/2020 9:05 AM

## 2020-12-22 NOTE — Progress Notes (Signed)
Patient ID: Lonnie Sermons., male   DOB: 03/23/1927, 87 y.o.   MRN: [id number] [facility] PROGRESS NOTE  Lonnie [name]. XLK:[id number] DOB: 1927/01/23 DOA: 12/16/2020 PCP: Derinda Late, MD  HPI/Subjective: This morning patient had NG tube out and was eating some vanilla ice cream.  Patient had no abdominal pain.  He stated he had bowel movements.  Declined further enemas.  Feels a little bit better.  Initially admitted with weakness.  Objective: Vitals:   12/22/20 0838 12/22/20 1141  BP: (!) 160/60 (!) 152/72  Pulse: 84 81  Resp: 17 17  Temp: 98.2 F (36.8 C) 98.5 F (36.9 C)  SpO2: 96% (!) 89%    Intake/Output Summary (Last 24 hours) at 12/22/2020 1319 Last data filed at 12/22/2020 0916 Gross per 24 hour  Intake 1600.51 ml  Output 3625 ml  Net -2024.49 ml   Filed Weights   12/16/20 1116 12/17/20 1206  Weight: 83 kg 83.4 kg    ROS: Review of Systems  Respiratory: Negative for shortness of breath.   Cardiovascular: Negative for chest pain.  Gastrointestinal: Negative for abdominal pain, nausea and vomiting.   Exam: Physical Exam HENT:     Head: Normocephalic.     Mouth/Throat:     Pharynx: No oropharyngeal exudate.  Eyes:     General: Lids are normal.     Conjunctiva/sclera: Conjunctivae normal.  Cardiovascular:     Rate and Rhythm: Normal rate and regular rhythm.     Heart sounds: Normal heart sounds, S1 normal and S2 normal.  Pulmonary:     Breath sounds: Examination of the right-lower field reveals decreased breath sounds. Examination of the left-lower field reveals decreased breath sounds. Decreased breath sounds present. No wheezing, rhonchi or rales.  Abdominal:     Palpations: Abdomen is soft.     Tenderness: There is no abdominal tenderness.  Musculoskeletal:     Right lower leg: Swelling present.     Left lower leg: Swelling present.  Skin:    General: Skin is warm.     Findings: No rash.  Neurological:     Mental Status: He is alert and  oriented to person, place, and time.     Comments: Able to straight leg raise bilaterally       Data Reviewed: Basic Metabolic Panel: Recent Labs  Lab 12/16/20 2030 12/17/20 0504 12/18/20 0732 12/19/20 0723 12/20/20 0813 12/21/20 0615 12/22/20 0444  NA 136   < > 138 137 140 142 143  K 4.3   < > 4.1 4.0 4.6 4.1 3.9  CL 113*   < > 117* 117* 118* 120* 121*  CO2 16*   < > 14* 14* 15* 16* 17*  GLUCOSE 109*   < > 101* 111* 120* 104* 74  BUN 57*   < > 58* 54* 54* 55* 49*  CREATININE 2.60*   < > 2.59* 2.15* 2.01* 1.85* 1.59*  CALCIUM 8.1*   < > 8.2* 8.3* 8.6* 8.6* 8.6*  MG 3.0*  --   --   --   --   --   --    < > = values in this interval not displayed.   Liver Function Tests: Recent Labs  Lab 12/16/20 1119 12/18/20 0732 12/22/20 0444  AST _0 ALT _1 ALKPHOS 62 60 81  BILITOT 0.6 0.7 0.7  PROT 6.3* 5.5* 5.9*  ALBUMIN 3.1* 2.5* 2.5*   Recent Labs  Lab 12/18/20 0732 12/19/20 0723 12/20/20  0813 12/21/20 0615 12/22/20 0444  LIPASE 74* 81* 95* 76* 88*   CBC: Recent Labs  Lab 12/17/20 0504 12/18/20 0732 12/19/20 0723 12/21/20 0615 12/22/20 0444  WBC 19.0* 16.0* 14.8* 10.8* 10.4  HGB 9.4* 8.9* 9.1* 8.2* 9.1*  HCT 28.2* 27.0* 26.4* 25.2* 26.7*  MCV 100.7* 99.6 98.1 100.0 97.1  PLT 195 187 212 238 272     Recent Results (from the past 240 hour(s))  Blood culture (routine x 2)     Status: None   Collection Time: 12/16/20  3:36 PM   Specimen: BLOOD  Result Value Ref Range Status   Specimen Description BLOOD LEFT ANTECUBITAL  Final   Special Requests   Final    BOTTLES DRAWN AEROBIC AND ANAEROBIC Blood Culture adequate volume   Culture   Final    NO GROWTH 5 DAYS Performed at [facility], [address] [place], [place] [address]    Report Status 12/21/2020 FINAL  Final  Blood culture (routine x 2)     Status: None   Collection Time: 12/16/20  3:36 PM   Specimen: BLOOD  Result Value Ref Range Status   Specimen Description BLOOD  BLOOD LEFT FOREARM  Final   Special Requests   Final    BOTTLES DRAWN AEROBIC AND ANAEROBIC Blood Culture adequate volume   Culture   Final    NO GROWTH 5 DAYS Performed at [facility], [address] [place], [place] [address]    Report Status 12/21/2020 FINAL  Final  SARS CORONAVIRUS 2 (TAT 6-24 HRS) Nasopharyngeal Nasopharyngeal Swab     Status: None   Collection Time: 12/17/20 10:23 AM   Specimen: Nasopharyngeal Swab  Result Value Ref Range Status   SARS Coronavirus 2 NEGATIVE NEGATIVE Final    Comment: (NOTE) SARS-CoV-2 target nucleic acids are NOT DETECTED.  The SARS-CoV-2 RNA is generally detectable in upper and lower respiratory specimens during the acute phase of infection. Negative results do not preclude SARS-CoV-2 infection, do not rule out co-infections with other pathogens, and should not be used as the sole basis for treatment or other patient management decisions. Negative results must be combined with clinical observations, patient history, and epidemiological information. The expected result is Negative.  Fact Sheet for Patients: https://www.fda.gov/media/138098/download  Fact Sheet for Healthcare Providers: https://www.fda.gov/media/138095/download  This test is not yet approved or cleared by the [place] FDA and  has been authorized for detection and/or diagnosis of SARS-CoV-2 by FDA under an Emergency Use Authorization (EUA). This EUA will remain  in effect (meaning this test can be used) for the duration of the COVID-19 declaration under Se ction 564(b)(1) of the Act, 21 U.S.C. section 360bbb-3(b)(1), unless the authorization is terminated or revoked sooner.  Performed at [facility], [address] [place], [place] [address]      Studies: DG Abd 1 View  Result Date: 12/21/2020 CLINICAL DATA:  NG tube placement EXAM: ABDOMEN - 1 VIEW COMPARISON:  Radiograph 12/21/2020 FINDINGS: Interval placement of a transesophageal tube  with the tip and side port distal to the GE junction, terminating near the level of the gastric antrum. Decompression of the previously seen gastric distension. Few persistently air filled loops of small bowel are seen in the upper abdomen. Redemonstration of a pigtail pleural drain terminating in the right upper quadrant. Scarring and/or atelectasis in the bilateral lung bases. Calcified pleural plaque along the left base. Stable cardiomediastinal contours multilevel degenerative changes are present in the imaged portions of the   spine. No acute osseous abnormality or suspicious osseous lesion. IMPRESSION: Interval placement of a transesophageal tube with the tip and side port distal to the GE junction, terminating near the level of the gastric antrum. Decompression of the previously seen gastric distension. Electronically Signed   By: Price  DeHay M.D.   On: 12/21/2020 02:43   DG Abd 1 View  Result Date: 12/21/2020 CLINICAL DATA:  Abdominal distension EXAM: ABDOMEN - 1 VIEW COMPARISON:  12/20/2020 FINDINGS: Increasing gaseous distention of the stomach when compared to recent radiographs. Additional loops of air-filled small bowel are seen throughout the mid abdomen. Large colonic stool burden is noted as well with fecal material throughout the colon and rectal vault. Vascular calcifications project over the abdomen and pelvis. Prostate brachytherapy implants are noted. The osseous structures appear diffusely demineralized which may limit detection of small or nondisplaced fractures. No acute osseous abnormality or suspicious osseous lesion. Atelectatic changes in the lung bases. Pigtail catheter terminates in the right upper quadrant. IMPRESSION: 1. Increasing gaseous distention of the stomach when compared to recent radiographs, consider nasogastric decompression. 2. Additional air-filled loops of small bowel are clustered in the mid abdomen, favor ileus given lack of distal decompression with a large colonic  stool burden throughout the colon and rectal vault. 3. Pigtail catheter remains in the right upper quadrant. Electronically Signed   By: Price  DeHay M.D.   On: 12/21/2020 01:36   DG Abd 2 Views  Result Date: 12/22/2020 CLINICAL DATA:  Weakness.  Ileus. EXAM: ABDOMEN - 2 VIEW COMPARISON:  12/21/2020. FINDINGS: NG tube noted with tip over the stomach. Right upper quadrant drainage catheter stable position. No bowel distention. Stool noted throughout the colon. Mild left base subsegmental atelectasis. Calcified pleural plaques again noted. Degenerative changes scoliosis lumbar spine. Degenerative changes both hips. Surgical clips in the pelvis. Aortoiliac and visceral atherosclerotic vascular calcification. IMPRESSION: 1. NG tube and right upper quadrant drainage catheter in stable position. 2. No bowel distention. Stool noted throughout the colon. Electronically Signed   By: Thomas  Register   On: 12/22/2020 08:19    Scheduled Meds: . alfuzosin  10 mg Oral Q breakfast  . allopurinol  50 mg Oral q morning  . aspirin EC  81 mg Oral Daily  . atorvastatin  40 mg Oral q morning  . Chlorhexidine Gluconate Cloth  6 each Topical Daily  . enoxaparin (LOVENOX) injection  40 mg Subcutaneous Q24H  . feeding supplement  237 mL Oral BID BM  . finasteride  5 mg Oral BH-q7a  . fluconazole  50 mg Oral Daily  . gabapentin  100 mg Oral BID  . Ipratropium-Albuterol  2 puff Inhalation QID  . LORazepam  1 mg Intravenous Once  . magnesium oxide  400 mg Oral q morning  . melatonin  5 mg Oral QHS  . mometasone-formoterol  2 puff Inhalation BID  . NIFEdipine  60 mg Oral q morning  . nystatin   Topical TID  . omega-3 acid ethyl esters  1 g Oral Daily  . pantoprazole  40 mg Oral Daily  . polyethylene glycol  17 g Oral Daily  . senna-docusate  2 tablet Oral Daily  . vitamin B-12  1,000 mcg Oral Daily   Continuous Infusions: . sodium chloride 40 mL/hr at 12/21/20 1709  . ampicillin-sulbactam (UNASYN) IV      Brief history.  Patient had a recent hospitalization from 12/08/2020 and was discharged home on 12/14/2020.  Was admitted with acute cholecystitis and met sepsis criteria   at that time.  The patient had a percutaneous IR gallbladder drain placed which grew out Enterococcus faecium.  Patient was admitted back to the hospital on 12/16/2020 with weakness, acute kidney injury on chronic kidney disease, BPH with urinary retention requiring Foley catheter.  Patient was started on empiric antibiotics.  During the hospital course patient had an ileus and constipation and required NG tube placement.  NG tube was removed and is now on full liquid diet.  Assessment/Plan:  1. Ileus, distended stomach and constipation.  NG tube is removed and patient on full liquid diet.  Dr. Windell Moment surgery following.  Continue IV fluid hydration as needed Zofran.  We will add MiraLAX daily. 2. Acute kidney injury on chronic kidney disease stage IIIa.  Creatinine peaked at 2.61 on 12/07/2020.  Today's creatinine down to 1.59.  On gentle IV fluid hydration. 3. Acute cholecystitis status post percutaneous gallbladder drain.  The patient still has an elevated pancreatic enzyme but no abdominal pain and CT scan did not show any inflammation of the pancreas.  Continue empiric Unasyn day 6.  White blood cell count now in the normal range and was elevated on presentation. 4. Wide-complex tachycardia on presentation.  Patient received 1 dose of amiodarone IV and now is on oral Adalat.  Echocardiogram shows a normal ejection fraction. 5. Essential hypertension on Adalat CC 6. Thrush.  This has improved.  Continue IV Diflucan 7. Hyperlipidemia unspecified on atorvastatin 8. Generalized weakness.  Physical therapy initially recommends rehab will have to have a reevaluation. 9. Appreciate palliative care consultation. 10. History of basal cell carcinoma 11. Anemia.  Last hemoglobin 9.1.  Check ferritin tomorrow morning.     Code Status:      Code Status Orders  (From admission, onward)         Start     Ordered   12/16/20 1709  Full code  Continuous        12/16/20 1710        Code Status History    Date Active Date Inactive Code Status Order ID Comments User Context   12/08/2020 1621 12/14/2020 2348 Full Code [id number]  Criss Alvine, DO ED   08/11/2018 2104 08/15/2018 1748 Full Code [id number]  Vaughan Basta, MD Inpatient   01/10/2016 2016 01/18/2016 1943 Full Code [id number]  Henreitta Leber, MD Inpatient   Advance Care Planning Activity    Advance Directive Documentation   Flowsheet Row Most Recent Value  Type of Advance Directive Healthcare Power of Attorney, Living will  Pre-existing out of facility DNR order (yellow form or pink MOST form) -  "MOST" Form in Place? -     Family Communication: Spoke with [name] on the phone Disposition Plan: Status is: Inpatient  Dispo: The patient is from: Home              Anticipated d/c is to: Rehab              Patient currently not medically stable.  Will have to tolerate solid food prior to disposition   Difficult to place patient.  No.  Time spent: 28 minutes  [place]

## 2020-12-23 DIAGNOSIS — K567 Ileus, unspecified: Secondary | ICD-10-CM | POA: Diagnosis not present

## 2020-12-23 DIAGNOSIS — N179 Acute kidney failure, unspecified: Secondary | ICD-10-CM | POA: Diagnosis not present

## 2020-12-23 DIAGNOSIS — K810 Acute cholecystitis: Secondary | ICD-10-CM | POA: Diagnosis not present

## 2020-12-23 DIAGNOSIS — N189 Chronic kidney disease, unspecified: Secondary | ICD-10-CM | POA: Diagnosis not present

## 2020-12-23 LAB — BASIC METABOLIC PANEL
Anion gap: 5 (ref 5–15)
BUN: 39 mg/dL — ABNORMAL HIGH (ref 8–23)
CO2: 17 mmol/L — ABNORMAL LOW (ref 22–32)
Calcium: 8.5 mg/dL — ABNORMAL LOW (ref 8.9–10.3)
Chloride: 120 mmol/L — ABNORMAL HIGH (ref 98–111)
Creatinine, Ser: 1.36 mg/dL — ABNORMAL HIGH (ref 0.61–1.24)
GFR, Estimated: 49 mL/min — ABNORMAL LOW (ref >60)
Glucose, Bld: 94 mg/dL (ref 70–99)
Potassium: 3.6 mmol/L (ref 3.5–5.1)
Sodium: 142 mmol/L (ref 135–145)

## 2020-12-23 LAB — SARS CORONAVIRUS 2 (TAT 6-24 HRS): SARS Coronavirus 2: NEGATIVE

## 2020-12-23 LAB — CBC
HCT: 26.7 % — ABNORMAL LOW (ref 39.0–52.0)
Hemoglobin: 9.1 g/dL — ABNORMAL LOW (ref 13.0–17.0)
MCH: 32.9 pg (ref 26.0–34.0)
MCHC: 34.1 g/dL (ref 30.0–36.0)
MCV: 96.4 fL (ref 80.0–100.0)
Platelets: 262 10*3/uL (ref 150–400)
RBC: 2.77 MIL/uL — ABNORMAL LOW (ref 4.22–5.81)
RDW: 13.5 % (ref 11.5–15.5)
WBC: 10.8 10*3/uL — ABNORMAL HIGH (ref 4.0–10.5)
nRBC: 0 % (ref 0.0–0.2)

## 2020-12-23 LAB — LIPASE, BLOOD: Lipase: 105 U/L — ABNORMAL HIGH (ref 11–51)

## 2020-12-23 LAB — FERRITIN: Ferritin: 706 ng/mL — ABNORMAL HIGH (ref 24–336)

## 2020-12-23 MED ORDER — IPRATROPIUM-ALBUTEROL 0.5-2.5 (3) MG/3ML IN SOLN: 3.0000 mL | RESPIRATORY_TRACT | Status: DC | PRN

## 2020-12-23 NOTE — Progress Notes (Signed)
Patient ID: Lonnie Sermons., male   DOB: Feb 10, 1927, 87 y.o.   MRN: [id number]     [facility] Day(s): 7.   Post op day(s):  Marland Kitchen   Interval History: Patient seen and examined, no acute events or new complaints overnight. Patient reports feeling better today.  He denies any nausea or vomiting.  He report passing a having bowel movement.  He denies abdominal pain.  There is some radiation.  There is no alleviating or aggravating factors.  Vital signs in last 24 hours: [min-max] current  Temp:  [97.6 F (36.4 C)-99 F (37.2 C)] 99 F (37.2 C) (03/23 1211) Pulse Rate:  [72-79] 79 (03/23 1211) Resp:  [16-20] 17 (03/23 1211) BP: (143-163)/(55-68) 163/61 (03/23 1211) SpO2:  [96 %-98 %] 98 % (03/23 1211)     Height: 6' (182.9 cm) Weight: 83.4 kg BMI (Calculated): 24.93   Physical Exam:  Constitutional: alert, cooperative and no distress  Respiratory: breathing non-labored at rest  Cardiovascular: regular rate and sinus rhythm  Gastrointestinal: soft, non-tender, and non-distended  Labs:  CBC Latest Ref Rng & Units 12/23/2020 12/22/2020 12/21/2020  WBC 4.0 - 10.5 K/uL 10.8(H) 10.4 10.8(H)  Hemoglobin 13.0 - 17.0 g/dL 9.1(L) 9.1(L) 8.2(L)  Hematocrit 39.0 - 52.0 % 26.7(L) 26.7(L) 25.2(L)  Platelets 150 - 400 K/uL 262 272 238   CMP Latest Ref Rng & Units 12/23/2020 12/22/2020 12/21/2020  Glucose 70 - 99 mg/dL 94 74 104(H)  BUN 8 - 23 mg/dL 39(H) 49(H) 55(H)  Creatinine 0.61 - 1.24 mg/dL 1.36(H) 1.59(H) 1.85(H)  Sodium 135 - 145 mmol/L 142 143 142  Potassium 3.5 - 5.1 mmol/L 3.6 3.9 4.1  Chloride 98 - 111 mmol/L 120(H) 121(H) 120(H)  CO2 22 - 32 mmol/L 17(L) 17(L) 16(L)  Calcium 8.9 - 10.3 mg/dL 8.5(L) 8.6(L) 8.6(L)  Total Protein 6.5 - 8.1 g/dL - 5.9(L) -  Total Bilirubin 0.3 - 1.2 mg/dL - 0.7 -  Alkaline Phos 38 - 126 U/L - 81 -  AST 15 - 41 U/L - 23 -  ALT 0 - 44 U/L - 27 -    Imaging studies: No new pertinent imaging studies   Assessment/Plan:  87  y.o.malewith ileus, generalized weakness, acute kidney injury (improving), recent cholecystitis resolved with percutaneous cholecystostomy, complicated by pertinent comorbidities includinghypertension, chronic kidney disease stage III, COPD, coronary tree disease, hyperlipidemia.  Patient is resolved.  Patient tolerated full liquids.  I will advance to soft diet.  No surgical management will be indicated at this moment.  Percutaneous cholecystostomy working adequately.  Continue current management as per primary team.  Arnold Long, MD

## 2020-12-23 NOTE — Progress Notes (Signed)
[facility]  ROUNDING NOTE   Subjective:   Patient resting in bed  Says he is tired today Denies nausea and discomfort Denies shortness of breath  UOP 3.6L  Objective:  Vital signs in last 24 hours:  Temp:  [97.6 F (36.4 C)-98.8 F (37.1 C)] 97.6 F (36.4 C) (03/23 0730) Pulse Rate:  [72-81] 72 (03/23 0730) Resp:  [16-20] 18 (03/23 0730) BP: (143-160)/(55-72) 158/63 (03/23 0730) SpO2:  [89 %-98 %] 98 % (03/23 0730)  Weight change:  Filed Weights   12/16/20 1116 12/17/20 1206  Weight: 83 kg 83.4 kg    Intake/Output: I/O last 3 completed shifts: In: 1110.2 [I.V.:974.7; Other:20; IV Piggyback:115.6] Out: 2703 [Urine:5600; Drains:895]   Intake/Output this shift:  Total I/O In: 360 [P.O.:360] Out: -   Physical Exam: General: In no acute distress,   Head: Normocephalic, atraumatic, moist oral mucosa  Lungs:  Respirations even, slightly labored with exertion, lungs clear  Heart: S1S2,+murmur  Abdomen:  Soft, nontender, non distended  Extremities:  No peripheral edema.  Neurologic: Able to answer simple questions  Skin: No acute  Lesions or rashes    Basic Metabolic Panel: Recent Labs  Lab 12/16/20 2030 12/17/20 0504 12/19/20 0723 12/20/20 0813 12/21/20 0615 12/22/20 0444 12/23/20 0522  NA 136   < > 137 140 142 143 142  K 4.3   < > 4.0 4.6 4.1 3.9 3.6  CL 113*   < > 117* 118* 120* 121* 120*  CO2 16*   < > 14* 15* 16* 17* 17*  GLUCOSE 109*   < > 111* 120* 104* 74 94  BUN 57*   < > 54* 54* 55* 49* 39*  CREATININE 2.60*   < > 2.15* 2.01* 1.85* 1.59* 1.36*  CALCIUM 8.1*   < > 8.3* 8.6* 8.6* 8.6* 8.5*  MG 3.0*  --   --   --   --   --   --    < > = values in this interval not displayed.    Liver Function Tests: Recent Labs  Lab 12/16/20 1119 12/18/20 0732 12/22/20 0444  AST 26 20 23   ALT 22 20 27   ALKPHOS 62 60 81  BILITOT 0.6 0.7 0.7  PROT 6.3* 5.5* 5.9*  ALBUMIN 3.1* 2.5* 2.5*   Recent Labs  Lab 12/19/20 0723 12/20/20 0813  12/21/20 0615 12/22/20 0444 12/23/20 0522  LIPASE 81* 95* 76* 88* 105*   No results for input(s): AMMONIA in the last 168 hours.  CBC: Recent Labs  Lab 12/18/20 0732 12/19/20 0723 12/21/20 0615 12/22/20 0444 12/23/20 0522  WBC 16.0* 14.8* 10.8* 10.4 10.8*  HGB 8.9* 9.1* 8.2* 9.1* 9.1*  HCT 27.0* 26.4* 25.2* 26.7* 26.7*  MCV 99.6 98.1 100.0 97.1 96.4  PLT 187 212 238 272 262    Cardiac Enzymes: No results for input(s): CKTOTAL, CKMB, CKMBINDEX, TROPONINI in the last 168 hours.  BNP: Invalid input(s): POCBNP  CBG: No results for input(s): GLUCAP in the last 168 hours.  Microbiology: Results for orders placed or performed during the hospital encounter of 12/16/20  Blood culture (routine x 2)     Status: None   Collection Time: 12/16/20  3:36 PM   Specimen: BLOOD  Result Value Ref Range Status   Specimen Description BLOOD LEFT ANTECUBITAL  Final   Special Requests   Final    BOTTLES DRAWN AEROBIC AND ANAEROBIC Blood Culture adequate volume   Culture   Final    NO GROWTH 5 DAYS Performed at  [facility], [address], [place], [place] [address]    Report Status 12/21/2020 FINAL  Final  Blood culture (routine x 2)     Status: None   Collection Time: 12/16/20  3:36 PM   Specimen: BLOOD  Result Value Ref Range Status   Specimen Description BLOOD BLOOD LEFT FOREARM  Final   Special Requests   Final    BOTTLES DRAWN AEROBIC AND ANAEROBIC Blood Culture adequate volume   Culture   Final    NO GROWTH 5 DAYS Performed at [facility], [address], [place], [place] [address]    Report Status 12/21/2020 FINAL  Final  SARS CORONAVIRUS 2 (TAT 6-24 HRS) Nasopharyngeal Nasopharyngeal Swab     Status: None   Collection Time: 12/17/20 10:23 AM   Specimen: Nasopharyngeal Swab  Result Value Ref Range Status   SARS Coronavirus 2 NEGATIVE NEGATIVE Final    Comment: (NOTE) SARS-CoV-2 target nucleic acids are NOT DETECTED.  The SARS-CoV-2 RNA is  generally detectable in upper and lower respiratory specimens during the acute phase of infection. Negative results do not preclude SARS-CoV-2 infection, do not rule out co-infections with other pathogens, and should not be used as the sole basis for treatment or other patient management decisions. Negative results must be combined with clinical observations, patient history, and epidemiological information. The expected result is Negative.  Fact Sheet for Patients: SugarRoll.be  Fact Sheet for Healthcare Providers: https://www.woods-mathews.com/  This test is not yet approved or cleared by the [place] FDA and  has been authorized for detection and/or diagnosis of SARS-CoV-2 by FDA under an Emergency Use Authorization (EUA). This EUA will remain  in effect (meaning this test can be used) for the duration of the COVID-19 declaration under Se ction 564(b)(1) of the Act, 21 U.S.C. section 360bbb-3(b)(1), unless the authorization is terminated or revoked sooner.  Performed at [facility], [place] [address], [place], [place] [address]     Coagulation Studies: No results for input(s): LABPROT, INR in the last 72 hours.  Urinalysis: No results for input(s): COLORURINE, LABSPEC, PHURINE, GLUCOSEU, HGBUR, BILIRUBINUR, KETONESUR, PROTEINUR, UROBILINOGEN, NITRITE, LEUKOCYTESUR in the last 72 hours.  Invalid input(s): APPERANCEUR    Imaging: DG Abd 2 Views  Result Date: 12/22/2020 CLINICAL DATA:  Weakness.  Ileus. EXAM: ABDOMEN - 2 VIEW COMPARISON:  12/21/2020. FINDINGS: NG tube noted with tip over the stomach. Right upper quadrant drainage catheter stable position. No bowel distention. Stool noted throughout the colon. Mild left base subsegmental atelectasis. Calcified pleural plaques again noted. Degenerative changes scoliosis lumbar spine. Degenerative changes both hips. Surgical clips in the pelvis. Aortoiliac and visceral  atherosclerotic vascular calcification. IMPRESSION: 1. NG tube and right upper quadrant drainage catheter in stable position. 2. No bowel distention. Stool noted throughout the colon. Electronically Signed   By: Marcello Moores  Register   On: 12/22/2020 08:19     Medications:   . sodium chloride 40 mL/hr at 12/23/20 0857  . ampicillin-sulbactam (UNASYN) IV 3 g (12/23/20 0502)   . alfuzosin  10 mg Oral Q breakfast  . allopurinol  50 mg Oral q morning  . aspirin EC  81 mg Oral Daily  . atorvastatin  40 mg Oral q morning  . Chlorhexidine Gluconate Cloth  6 each Topical Daily  . enoxaparin (LOVENOX) injection  40 mg Subcutaneous Q24H  . finasteride  5 mg Oral BH-q7a  . fluconazole  50 mg Oral Daily  . gabapentin  100 mg Oral BID  .  Ipratropium-Albuterol  2 puff Inhalation QID  . LORazepam  1 mg Intravenous Once  . magnesium oxide  400 mg Oral q morning  . melatonin  5 mg Oral QHS  . mometasone-formoterol  2 puff Inhalation BID  . multivitamin with minerals  1 tablet Oral Daily  . NIFEdipine  60 mg Oral q morning  . nystatin   Topical TID  . omega-3 acid ethyl esters  1 g Oral Daily  . pantoprazole  40 mg Oral Daily  . polyethylene glycol  17 g Oral Daily  . Ensure Max Protein  11 oz Oral BID  . senna-docusate  2 tablet Oral Daily  . vitamin B-12  1,000 mcg Oral Daily   acetaminophen, bisacodyl, ipratropium-albuterol, lactulose, menthol-cetylpyridinium, morphine injection, ondansetron **OR** ondansetron (ZOFRAN) IV  Assessment/ Plan:  Mr. Lonnie [name]. is a 87 y.o.  male presented to the ER from home by EMS for weakness.  He recently had a cholecystostomy drain placed He required In-N-Out cath and 700 cc of urine was obtained Foley catheter was then inserted He also has intra-abdominal infection/abscess with enterococcal PCM.  Treated with Unasyn.  He was evaluated by general surgeon-gallbladders was thought to be well decompressed.  No bowel obstruction or gaseous distention.  No  surgical intervention recommended.  #Acute Kidney Injury  Baseline creatinine appears to be 1.43/GFR 46 from December 08, 2020 Acute kidney injury secondary to urinary retention, IV contrast exposure on March 8 and concurrent illness likely causing ATN Lab Results  Component Value Date   CREATININE 1.36 (H) 12/23/2020   CREATININE 1.59 (H) 12/22/2020   CREATININE 1.85 (H) 12/21/2020  ` Creatinine back to baseline Will continue to monitoring  # Acute Urinary retention  Foley catheter remains in place  Alfuzosin 10mg  and Finasteride 5 mg  daily  Colon Flattery , MD [facility] 3/23/202210:23 AM     LOS: 7 Lonnie [name] 3/23/202210:23 AM

## 2020-12-23 NOTE — TOC Progression Note (Signed)
Transition of Care (TOC) - Progression Note    Patient Details  Name: Lonnie [name]. MRN: [id number] Date of Birth: [birth year]  Transition of Care [facility]) CM/SW Contact  Shelbie Hutching, RN Phone Number: 12/23/2020, 4:24 PM  Clinical Narrative:    Potential for discharge tomorrow.  [place] started. COVID test ordered.    Expected Discharge Plan: [place] Barriers to Discharge: Continued Medical Work up  Expected Discharge Plan and Services Expected Discharge Plan: [place]   Discharge Planning Services: CM Consult Post Acute Care Choice: [place] Living arrangements for the past 2 months: Single Family Home                 DME Arranged: N/A DME Agency: NA       HH Arranged: NA HH Agency: NA         Social Determinants of Health (SDOH) Interventions    Readmission Risk Interventions Readmission Risk Prevention Plan 12/18/2020  Transportation Screening Complete  PCP or Specialist Appt within 3-5 Days Complete  HRI or [place] Complete  Social Work Consult for [place] Planning/Counseling Complete  Palliative Care Screening Not Applicable  Medication Review Press photographer) Complete  Some recent data might be hidden

## 2020-12-23 NOTE — Progress Notes (Signed)
PROGRESS NOTE    Lonnie [name].  CBJ:[id number] DOB: 1927/09/24 DOA: 12/16/2020 PCP: Derinda Late, MD  Assessment & Plan:   Principal Problem:   Generalized weakness Active Problems:   Benign prostatic hyperplasia   Stage 3 chronic kidney disease ([place])   Essential hypertension   Acute cholecystitis   Dehydration   Acute kidney injury superimposed on CKD (HCC)   Leukocytosis   Wide-complex tachycardia (HCC)   Thrush   Abdominal pain   Non-intractable vomiting   Ileus (HCC)   Constipation  Ileus: s/p NG tube removed. Continue on full liquid diet.Zofran prn. General surg following and recs apprec    AKI on CKDIIIa: Cr is trending is down from day prior. Avoid nephrotoxic meds. Nephro following and recs apprec   Acute cholecystitis: s/p percutaneous gallbladder drain. Continue on IV unasyn  Wide-complex tachycardia: on presentation. Continue on nifedipine. Echo shows EF of 16-07%, diastolic function could not be evaluated, no regional wall motion abnormalities   HTN: continue on nifedipine.   Thrush: continue on fluconazole  HLD: continue on statin  Generalized weakness: OT recs SNF  Normocytic anemia: H&H are stable. No need for a transfusion currently   Leukocytosis: likely secondary to infection. Continue on IV abxs     DVT prophylaxis: lovenox Code Status: full  Family Communication: Disposition Plan: will d/c to SNF  Level of care: Med-Surg   Status is: Inpatient  Remains inpatient appropriate because:IV treatments appropriate due to intensity of illness or inability to take PO and Inpatient level of care appropriate due to severity of illness   Dispo: The patient is from: Home              Anticipated d/c is to: SNF              Patient currently is not medically stable to d/c.   Difficult to place patient No     Consultants:   General surg   Nephro   Procedures:    Antimicrobials:    Subjective: Pt c/o  weakness  Objective: Vitals:   12/22/20 1628 12/22/20 2112 12/22/20 2352 12/23/20 0356  BP: (!) 160/68 (!) 143/56 (!) 145/55 (!) 143/56  Pulse: 79 75 72 75  Resp: 16 18 18 20   Temp: 98.8 F (37.1 C) 97.7 F (36.5 C) 98.4 F (36.9 C) 98.2 F (36.8 C)  TempSrc:  Oral Oral Oral  SpO2: 98% 97% 97% 96%  Weight:      Height:        Intake/Output Summary (Last 24 hours) at 12/23/2020 0717 Last data filed at 12/23/2020 3710 Gross per 24 hour  Intake 1110.23 ml  Output 4495 ml  Net -3384.77 ml   Filed Weights   12/16/20 1116 12/17/20 1206  Weight: 83 kg 83.4 kg    Examination:  General exam: Appears calm and comfortable  Respiratory system: Clear to auscultation. Respiratory effort normal. Cardiovascular system: S1 & S2 +. No rubs, gallops or clicks. Gastrointestinal system: Abdomen is nondistended, soft and nontender. Normal bowel sounds heard. Central nervous system: Alert and oriented. Moves all extremities  Psychiatry: Judgement and insight appear normal. Flat mood and affect.     Data Reviewed: I have personally reviewed following labs and imaging studies  CBC: Recent Labs  Lab 12/18/20 0732 12/19/20 0723 12/21/20 0615 12/22/20 0444 12/23/20 0522  WBC 16.0* 14.8* 10.8* 10.4 10.8*  HGB 8.9* 9.1* 8.2* 9.1* 9.1*  HCT 27.0* 26.4* 25.2* 26.7* 26.7*  MCV 99.6 98.1 100.0 97.1 96.4  PLT 187 212 238 272 540   Basic Metabolic Panel: Recent Labs  Lab 12/16/20 2030 12/17/20 0504 12/19/20 0723 12/20/20 0813 12/21/20 0615 12/22/20 0444 12/23/20 0522  NA 136   < > 137 140 142 143 142  K 4.3   < > 4.0 4.6 4.1 3.9 3.6  CL 113*   < > 117* 118* 120* 121* 120*  CO2 16*   < > 14* 15* 16* 17* 17*  GLUCOSE 109*   < > 111* 120* 104* 74 94  BUN 57*   < > 54* 54* 55* 49* 39*  CREATININE 2.60*   < > 2.15* 2.01* 1.85* 1.59* 1.36*  CALCIUM 8.1*   < > 8.3* 8.6* 8.6* 8.6* 8.5*  MG 3.0*  --   --   --   --   --   --    < > = values in this interval not displayed.    GFR: Estimated Creatinine Clearance: 38 mL/min (A) (by C-G formula based on SCr of 1.36 mg/dL (H)). Liver Function Tests: Recent Labs  Lab 12/16/20 1119 12/18/20 0732 12/22/20 0444  AST 26 20 23   ALT 22 20 27   ALKPHOS 62 60 81  BILITOT 0.6 0.7 0.7  PROT 6.3* 5.5* 5.9*  ALBUMIN 3.1* 2.5* 2.5*   Recent Labs  Lab 12/19/20 0723 12/20/20 0813 12/21/20 0615 12/22/20 0444 12/23/20 0522  LIPASE 81* 95* 76* 88* 105*   No results for input(s): AMMONIA in the last 168 hours. Coagulation Profile: No results for input(s): INR, PROTIME in the last 168 hours. Cardiac Enzymes: No results for input(s): CKTOTAL, CKMB, CKMBINDEX, TROPONINI in the last 168 hours. BNP (last 3 results) No results for input(s): PROBNP in the last 8760 hours. HbA1C: No results for input(s): HGBA1C in the last 72 hours. CBG: No results for input(s): GLUCAP in the last 168 hours. Lipid Profile: No results for input(s): CHOL, HDL, LDLCALC, TRIG, CHOLHDL, LDLDIRECT in the last 72 hours. Thyroid Function Tests: No results for input(s): TSH, T4TOTAL, FREET4, T3FREE, THYROIDAB in the last 72 hours. Anemia Panel: Recent Labs    12/23/20 0522  FERRITIN 706*   Sepsis Labs: Recent Labs  Lab 12/16/20 1535  LATICACIDVEN 1.3    Recent Results (from the past 240 hour(s))  Blood culture (routine x 2)     Status: None   Collection Time: 12/16/20  3:36 PM   Specimen: BLOOD  Result Value Ref Range Status   Specimen Description BLOOD LEFT ANTECUBITAL  Final   Special Requests   Final    BOTTLES DRAWN AEROBIC AND ANAEROBIC Blood Culture adequate volume   Culture   Final    NO GROWTH 5 DAYS Performed at [facility], [address] [place], [place] [address]    Report Status 12/21/2020 FINAL  Final  Blood culture (routine x 2)     Status: None   Collection Time: 12/16/20  3:36 PM   Specimen: BLOOD  Result Value Ref Range Status   Specimen Description BLOOD BLOOD LEFT FOREARM  Final   Special  Requests   Final    BOTTLES DRAWN AEROBIC AND ANAEROBIC Blood Culture adequate volume   Culture   Final    NO GROWTH 5 DAYS Performed at [facility], [address], [place], [place] [address]    Report Status 12/21/2020 FINAL  Final  SARS CORONAVIRUS 2 (TAT 6-24 HRS) Nasopharyngeal Nasopharyngeal Swab     Status: None   Collection Time: 12/17/20 10:23 AM   Specimen:  Nasopharyngeal Swab  Result Value Ref Range Status   SARS Coronavirus 2 NEGATIVE NEGATIVE Final    Comment: (NOTE) SARS-CoV-2 target nucleic acids are NOT DETECTED.  The SARS-CoV-2 RNA is generally detectable in upper and lower respiratory specimens during the acute phase of infection. Negative results do not preclude SARS-CoV-2 infection, do not rule out co-infections with other pathogens, and should not be used as the sole basis for treatment or other patient management decisions. Negative results must be combined with clinical observations, patient history, and epidemiological information. The expected result is Negative.  Fact Sheet for Patients: SugarRoll.be  Fact Sheet for Healthcare Providers: https://www.woods-mathews.com/  This test is not yet approved or cleared by the [place] FDA and  has been authorized for detection and/or diagnosis of SARS-CoV-2 by FDA under an Emergency Use Authorization (EUA). This EUA will remain  in effect (meaning this test can be used) for the duration of the COVID-19 declaration under Se ction 564(b)(1) of the Act, 21 U.S.C. section 360bbb-3(b)(1), unless the authorization is terminated or revoked sooner.  Performed at [facility], [place] [address], [place], [place] [address]          Radiology Studies: DG Abd 2 Views  Result Date: 12/22/2020 CLINICAL DATA:  Weakness.  Ileus. EXAM: ABDOMEN - 2 VIEW COMPARISON:  12/21/2020. FINDINGS: NG tube noted with tip over the stomach. Right upper quadrant drainage  catheter stable position. No bowel distention. Stool noted throughout the colon. Mild left base subsegmental atelectasis. Calcified pleural plaques again noted. Degenerative changes scoliosis lumbar spine. Degenerative changes both hips. Surgical clips in the pelvis. Aortoiliac and visceral atherosclerotic vascular calcification. IMPRESSION: 1. NG tube and right upper quadrant drainage catheter in stable position. 2. No bowel distention. Stool noted throughout the colon. Electronically Signed   By: Marcello Moores  Register   On: 12/22/2020 08:19        Scheduled Meds: . alfuzosin  10 mg Oral Q breakfast  . allopurinol  50 mg Oral q morning  . aspirin EC  81 mg Oral Daily  . atorvastatin  40 mg Oral q morning  . Chlorhexidine Gluconate Cloth  6 each Topical Daily  . enoxaparin (LOVENOX) injection  40 mg Subcutaneous Q24H  . finasteride  5 mg Oral BH-q7a  . fluconazole  50 mg Oral Daily  . gabapentin  100 mg Oral BID  . Ipratropium-Albuterol  2 puff Inhalation QID  . LORazepam  1 mg Intravenous Once  . magnesium oxide  400 mg Oral q morning  . melatonin  5 mg Oral QHS  . mometasone-formoterol  2 puff Inhalation BID  . multivitamin with minerals  1 tablet Oral Daily  . NIFEdipine  60 mg Oral q morning  . nystatin   Topical TID  . omega-3 acid ethyl esters  1 g Oral Daily  . pantoprazole  40 mg Oral Daily  . polyethylene glycol  17 g Oral Daily  . Ensure Max Protein  11 oz Oral BID  . senna-docusate  2 tablet Oral Daily  . vitamin B-12  1,000 mcg Oral Daily   Continuous Infusions: . sodium chloride 40 mL/hr at 12/22/20 1731  . ampicillin-sulbactam (UNASYN) IV 3 g (12/23/20 0502)     LOS: 7 days    Time spent: 32 mins     Wyvonnia Dusky, MD [facility] Pager [phone]  If 7PM-7AM, please contact night-coverage 12/23/2020, 7:17 AM

## 2020-12-24 DIAGNOSIS — N189 Chronic kidney disease, unspecified: Secondary | ICD-10-CM | POA: Diagnosis not present

## 2020-12-24 DIAGNOSIS — K810 Acute cholecystitis: Secondary | ICD-10-CM | POA: Diagnosis not present

## 2020-12-24 DIAGNOSIS — N179 Acute kidney failure, unspecified: Secondary | ICD-10-CM | POA: Diagnosis not present

## 2020-12-24 DIAGNOSIS — R531 Weakness: Secondary | ICD-10-CM | POA: Diagnosis not present

## 2020-12-24 LAB — BASIC METABOLIC PANEL
Anion gap: 6 (ref 5–15)
BUN: 31 mg/dL — ABNORMAL HIGH (ref 8–23)
CO2: 17 mmol/L — ABNORMAL LOW (ref 22–32)
Calcium: 8.7 mg/dL — ABNORMAL LOW (ref 8.9–10.3)
Chloride: 118 mmol/L — ABNORMAL HIGH (ref 98–111)
Creatinine, Ser: 1.35 mg/dL — ABNORMAL HIGH (ref 0.61–1.24)
GFR, Estimated: 49 mL/min — ABNORMAL LOW (ref >60)
Glucose, Bld: 93 mg/dL (ref 70–99)
Potassium: 3.8 mmol/L (ref 3.5–5.1)
Sodium: 141 mmol/L (ref 135–145)

## 2020-12-24 LAB — CBC
HCT: 27.7 % — ABNORMAL LOW (ref 39.0–52.0)
Hemoglobin: 9.4 g/dL — ABNORMAL LOW (ref 13.0–17.0)
MCH: 33.1 pg (ref 26.0–34.0)
MCHC: 33.9 g/dL (ref 30.0–36.0)
MCV: 97.5 fL (ref 80.0–100.0)
Platelets: 266 10*3/uL (ref 150–400)
RBC: 2.84 MIL/uL — ABNORMAL LOW (ref 4.22–5.81)
RDW: 13.8 % (ref 11.5–15.5)
WBC: 11.1 10*3/uL — ABNORMAL HIGH (ref 4.0–10.5)
nRBC: 0 % (ref 0.0–0.2)

## 2020-12-24 MED ORDER — GABAPENTIN 100 MG PO CAPS: 100.0000 mg | ORAL_CAPSULE | Freq: Two times a day (BID) | ORAL | Status: AC

## 2020-12-24 NOTE — TOC Transition Note (Signed)
Transition of Care [facility]) - CM/SW Discharge Note   Patient Details  Name: Lonnie [name]. MRN: [id number] Date of Birth: 09-23-1927  Transition of Care [facility]) CM/SW Contact:  Shelbie Hutching, RN Phone Number: 12/24/2020, 12:23 PM   Clinical Narrative:    Patient medically cleared for discharge to [facility] for SNF.  Patien twill be going to room 102, bedside RN will call report to [phone].  RNCM has arranged EMS transport and they are on their way to pick up patient. Daughter [name] is aware of discharge today.    Final next level of care: Skilled Nursing Facility Barriers to Discharge: Barriers Resolved   Patient Goals and CMS Choice Patient states their goals for this hospitalization and ongoing recovery are:: agrees to go to SNFfor rehab CMS Medicare.gov Compare Post Acute Care list provided to:: Patient Choice offered to / list presented to : [place]  Discharge Placement   Existing PASRR number confirmed : 12/18/20          Patient [facility] bed [facility] Patient to be transferred to facility by: [facility] Name of family member notified: [name] ( daughter) Patient and family notified of of transfer: 12/24/20  Discharge Plan and Services   Discharge Planning Services: CM Consult Post Acute Care Choice: [place]          DME Arranged: N/A DME Agency: NA       HH Arranged: NA [place] Agency: NA        Social Determinants of Health ([place]) Interventions     Readmission Risk Interventions Readmission Risk Prevention Plan 12/18/2020  Transportation Screening Complete  PCP or Specialist Appt within 3-5 Days Complete  HRI or [place] Complete  Social Work Consult for [place] Planning/Counseling Complete  Palliative Care Screening Not Applicable  Medication Review Press photographer) Complete  Some recent data might be hidden

## 2020-12-24 NOTE — Progress Notes (Signed)
Patient ID: [name]., male   DOB: [birth year], 87 y.o.   MRN: [id number]     [place] [facility] Day(s): 8.   Post op day(s):  Marland Kitchen   Interval History: Patient seen and examined, no acute events or new complaints overnight. Patient reports feeling better today.  She reports that he is going to rehab.  He reported that he ate roast beef yesterday and tolerated well.  He has much better appetite.  Denies any abdominal pain.  There is no pain radiation.  There is no alleviating or aggravating factors.  Vital signs in last 24 hours: [min-max] current  Temp:  [97.5 F (36.4 C)-99 F (37.2 C)] 98.6 F (37 C) (03/24 0747) Pulse Rate:  [73-81] 81 (03/24 0747) Resp:  [17-20] 20 (03/24 0342) BP: (150-166)/(57-74) 151/62 (03/24 0747) SpO2:  [97 %-100 %] 97 % (03/24 0747)     Height: 6' (182.9 cm) Weight: 83.4 kg BMI (Calculated): 24.93   Physical Exam:  Constitutional: alert, cooperative and no distress  Respiratory: breathing non-labored at rest  Cardiovascular: regular rate and sinus rhythm  Gastrointestinal: soft, non-tender, and non-distended.  Cholecystostomy tube in place working adequately.  Labs:  CBC Latest Ref Rng & Units 12/24/2020 12/23/2020 12/22/2020  WBC 4.0 - 10.5 K/uL 11.1(H) 10.8(H) 10.4  Hemoglobin 13.0 - 17.0 g/dL 9.4(L) 9.1(L) 9.1(L)  Hematocrit 39.0 - 52.0 % 27.7(L) 26.7(L) 26.7(L)  Platelets 150 - 400 K/uL 266 262 272   CMP Latest Ref Rng & Units 12/24/2020 12/23/2020 12/22/2020  Glucose 70 - 99 mg/dL 93 94 74  BUN 8 - 23 mg/dL 31(H) 39(H) 49(H)  Creatinine 0.61 - 1.24 mg/dL 1.35(H) 1.36(H) 1.59(H)  Sodium 135 - 145 mmol/L 141 142 143  Potassium 3.5 - 5.1 mmol/L 3.8 3.6 3.9  Chloride 98 - 111 mmol/L 118(H) 120(H) 121(H)  CO2 22 - 32 mmol/L 17(L) 17(L) 17(L)  Calcium 8.9 - 10.3 mg/dL 8.7(L) 8.5(L) 8.6(L)  Total Protein 6.5 - 8.1 g/dL - - 5.9(L)  Total Bilirubin 0.3 - 1.2 mg/dL - - 0.7  Alkaline Phos 38 - 126 U/L - - 81  AST 15 - 41 U/L - - 23  ALT 0  - 44 U/L - - 27    Imaging studies: No new pertinent imaging studies   Assessment/Plan:  87 y.o.malewithileus,generalized weakness, acute kidney injury(improving), recent cholecystitis resolved with percutaneous cholecystostomy, complicated by pertinent comorbidities includinghypertension, chronic kidney disease stage III, COPD, coronary tree disease, hyperlipidemia.  Patient today doing well from ileus standpoint.  He tolerated diet yesterday.  He has good appetite.  He continue passing gas to have a bowel movement.  The abdomen is soft and depressible.  The percutaneous cholecystostomy tube is working adequately.  From the surgical standpoint there will be no surgical management during this admission.  I will coordinate percutaneous cholecystostomy follow-up with IR as soon as patient is discharge.  Agreed to continue current management by primary team.  Arnold Long, MD

## 2020-12-24 NOTE — Discharge Summary (Signed)
Physician Discharge Summary  Lonnie [name]. QIO:[id number] DOB: [birth year] DOA: 12/16/2020  PCP: Derinda Late, MD  Admit date: 12/16/2020 Discharge date: 12/24/2020  Admitted From: home  Disposition:  SNF   Recommendations for Outpatient Follow-up:  1. Follow up with PCP in 1-2 weeks 2. F/u w/ general surg, Dr. Windell Moment, in 1-2 weeks. Dr. Windell Moment will coordinate f/u w/ IR drain clinic   Home Health: no  Equipment/Devices: percutaneous gallbladder drain   Discharge Condition: stable  CODE STATUS: full  Diet recommendation: Heart Healthy  Brief/Interim Summary: HPI was taken from Dr. Francine Graven: Lonnie [name]. is a 87 y.o. male with medical history significant for acute cholecystitis status post percutaneous cholecystostomy, hypertension, gout, basal cell carcinoma status post skin excision on 12/01/20 who presents to the emergency room via EMS for evaluation of weakness.  Patient's daughter states that he has been weak following his last hospitalization but since his discharge his weakness has worsened.  He also complains of pain in his right upper quadrant and his abdomen appeared more distended.  She states that he has not had a fever and per EMS he had a temp of 99.11F in the field.  He has not had any nausea or vomiting but oral intake is poor.  His daughter was concerned cholecystostomy tube was not draining and called EMS. In the ER patient had difficulty voiding and had an in and out cath done, he had 700 cc of urine in his bladder and required insertion of a Foley catheter. He denies having any chest pain, no shortness of breath, no cough, no palpitations, no diaphoresis, no headache, no blurred vision, no focal deficits, no dizziness or lightheadedness. Labs show sodium 138, potassium 4.1, chloride 113, bicarb 15, glucose 170, BUN 56, creatinine 2.51, calcium 8.4, alkaline phosphatase 62, albumin 3.1, lipase 89, AST 26, ALT 22, total protein 6.3, lactic acid 1.3, white  count 16.7, hemoglobin 10.1, hematocrit 30.9, MCV 100.7, RDW 13.7, platelet count 210 CT scan of abdomen and pelvis done without contrast showed no evidence of bowel obstruction. Cholecystostomy tube in place with a 3 mm stone in the decompressed gallbladder. No fluid collection in the gallbladder fossa. Decreased nodular left lower lobe pulmonary opacity favoring resolving pneumonia. Aortic Atherosclerosis. Twelve-lead EKG reviewed by me shows sinus rhythm with a left bundle branch block.   ED Course: Patient is a 87 year old male who was brought into the ER by EMS for evaluation of weakness and abdominal pain.  Patient was recently discharged from hospital following treatment for acute cholecystitis.  He is status post cholecystostomy tube placement and culture yielded Enterococcus faecium.  Patient was discharged home on Augmentin.  Labs show worsening leukocytosis with a left shift and normal lactic acid level.  Imaging shows gas /fluid-filled distended stomach.  He will be admitted to the hospital for further evaluation  Hospital course from Dr. Jimmye Norman 3/23-3/24/22: Pt was found to have an ileus that was treated conservatively w/ NPO and NG tube placement. By the time I saw the pt the NG tube had been removed and pt was tolerating a liquid diet. Pt's diet has been advanced and tolerating solids as well as liquids. Pt has also had several bowel movements. Of note, pt was found to have cholecystitis and was treated w/ abxs as well as percutaneous gallbladder drain. Pt has completed the abx course. Pt will be d/c w/ gallbladder drain and general surg, Dr. Windell Moment, will coordinate f/u at IR drain clinic. For more information, please see  previous progress/consult notes.    Discharge Diagnoses:  Principal Problem:   Generalized weakness Active Problems:   Benign prostatic hyperplasia   Stage 3 chronic kidney disease (HCC)   Essential hypertension   Acute cholecystitis   Dehydration    Acute kidney injury superimposed on CKD (HCC)   Leukocytosis   Wide-complex tachycardia (HCC)   Thrush   Abdominal pain   Non-intractable vomiting   Ileus (HCC)   Constipation   Ileus: s/p NG tube removed. Tolerating liquids and solids.Zofran prn. General surg following and recs apprec. Resolved   AKI on CKDIIIa: Cr continues to trend down. Nephro following and recs apprec  Acute cholecystitis: s/p percutaneous gallbladder drain. Completed abx course. Will f/u outpatient w/ general surg & IR drain clinic    Wide-complex tachycardia: on presentation. Continue on nifedipine. Echo shows EF of 82-99%, diastolic function could not be evaluated, no regional wall motion abnormalities   HTN: continue on nifedipine.   Thrush: completed anti-fungal course   HLD: continue on statin  Generalized weakness: OT recs SNF  Normocytic anemia: H&H are trending up. No need for a transfusion currently   Leukocytosis: labile, likely secondary to infection. Completed abx course.    Discharge Instructions  Discharge Instructions    Diet - low sodium heart healthy   Complete by: As directed    Discharge instructions   Complete by: As directed    F/u w/ PCP in 1-2 weeks. F/u w/ general surg, Dr. Windell Moment, in 1-2 weeks. Dr. Windell Moment will coordinate f/u w/ IR drain clinic.   Increase activity slowly   Complete by: As directed    No wound care   Complete by: As directed      Allergies as of 12/24/2020      Reactions   Sulfa Antibiotics Other (See Comments)   Not sure of reaction. It was too long ago, but he said not to give it to him.    Sulfasalazine    Other reaction(s): Other (See Comments) Not sure of reaction. It was too long ago, but he said not to give it to him.    Penicillin G Rash   Tolerates ampicillin and amoxicillin      Medication List    STOP taking these medications   amoxicillin-clavulanate 500-125 MG tablet Commonly known as: AUGMENTIN     TAKE these  medications   alfuzosin 10 MG 24 hr tablet Commonly known as: UROXATRAL Take 10 mg by mouth daily with breakfast.   allopurinol 300 MG tablet Commonly known as: ZYLOPRIM Take 300 mg by mouth every morning.   aspirin 81 MG tablet Take 81 mg by mouth daily.   atorvastatin 40 MG tablet Commonly known as: LIPITOR Take 40 mg by mouth every morning.   bisacodyl 5 MG EC tablet Commonly known as: DULCOLAX Take 10 mg by mouth 2 (two) times daily as needed.   budesonide-formoterol 160-4.5 MCG/ACT inhaler Commonly known as: SYMBICORT Inhale 2 puffs into the lungs 2 (two) times daily.   Combivent Respimat 20-100 MCG/ACT Aers respimat Generic drug: Ipratropium-Albuterol Inhale 1 puff into the lungs every morning.   ferrous sulfate 325 (65 FE) MG tablet Take 325 mg by mouth 2 (two) times daily with a meal.   finasteride 5 MG tablet Commonly known as: PROSCAR Take 5 mg by mouth every morning.   gabapentin 100 MG capsule Commonly known as: NEURONTIN Take 1 capsule (100 mg total) by mouth 2 (two) times daily. What changed:   medication strength  how  much to take   Magnesium Oxide 250 MG Tabs Take 500 mg by mouth every morning.   melatonin 5 MG Tabs Take 5 mg by mouth at bedtime.   NIFEdipine 30 MG 24 hr tablet Commonly known as: PROCARDIA-XL/NIFEDICAL-XL Take 60 mg by mouth every morning.   nystatin powder Commonly known as: MYCOSTATIN/NYSTOP Apply topically 3 (three) times daily.   Omega-3 1000 MG Caps Take 1,000 mg by mouth daily.   omeprazole 20 MG capsule Commonly known as: PRILOSEC Take 20 mg by mouth 2 (two) times daily.   sennosides-docusate sodium 8.6-50 MG tablet Commonly known as: SENOKOT-S Take 2 tablets by mouth daily.   vitamin B-12 1000 MCG tablet Commonly known as: CYANOCOBALAMIN Take 1,000 mcg by mouth daily.   vitamin C 250 MG tablet Commonly known as: ASCORBIC ACID Take 250 mg by mouth 2 (two) times daily.       Contact information for  follow-up providers    Derinda Late, MD.   Specialty: Family Medicine Contact information: [address] [facility] [place] [address] [phone]            Contact information for after-discharge care    Destination    [facility] .   Service: Skilled Nursing Contact information: [place] [address] [phone]                 Allergies  Allergen Reactions  . Sulfa Antibiotics Other (See Comments)    Not sure of reaction. It was too long ago, but he said not to give it to him.   . Sulfasalazine     Other reaction(s): Other (See Comments) Not sure of reaction. It was too long ago, but he said not to give it to him.   Marland Kitchen Penicillin G Rash    Tolerates ampicillin and amoxicillin    Consultations:  General surg, Dr. Johnney Ou, Dr. Driscilla Moats   IR   Procedures/Studies: CT ABDOMEN PELVIS WO CONTRAST  Result Date: 12/16/2020 CLINICAL DATA:  Bowel obstruction suspected. Increasing weakness. Recent acute cholecystitis treated with cholecystostomy tube placement. EXAM: CT ABDOMEN AND PELVIS WITHOUT CONTRAST TECHNIQUE: Multidetector CT imaging of the abdomen and pelvis was performed following the standard protocol without IV contrast. COMPARISON:  12/08/2020 FINDINGS: Lower chest: Partially visualized dependent opacities in the right greater than left lower lobes suggesting atelectasis and scarring with asymmetric bronchiectasis on the right. Decreased size of nodular left lower lobe opacity favoring resolving infection. Calcified pleural plaques bilaterally. No pleural effusion. Coronary atherosclerosis. Hepatobiliary: No focal liver abnormality is seen. A cholecystostomy tube is in place. There is a 3 mm stone within the collapsed gallbladder. No fluid collection is present in the gallbladder fossa. There is no biliary dilatation. Pancreas: Unremarkable. Spleen:  Unremarkable. Adrenals/Urinary Tract: Unremarkable adrenal glands. Bilateral renal vascular calcifications. Bilateral renal scarring. No hydronephrosis. Moderately distended and otherwise unremarkable bladder. Stomach/Bowel: The stomach is moderately distended by gas and fluid. There is no evidence of bowel obstruction or inflammation. A moderate amount of stool is noted in the colon and rectum. There is diverticulosis of the sigmoid colon without evidence of acute diverticulitis. Prior appendectomy. Vascular/Lymphatic: Abdominal aortic atherosclerosis without aneurysm. No enlarged lymph nodes. Reproductive: Status post urolift procedure with similar appearance of mild thickening of the adjacent right-sided pelvic musculature. Other: No intraperitoneal free fluid. Musculoskeletal: No acute osseous abnormality or suspicious osseous lesion. Mild lumbar levoscoliosis. Mild disc and advanced facet  degeneration in the lumbar spine. Similar appearance of mixed sclerosis and lucency in the femoral heads without collapse, query AVN. IMPRESSION: 1. No evidence of bowel obstruction. 2. Cholecystostomy tube in place with a 3 mm stone in the decompressed gallbladder. No fluid collection in the gallbladder fossa. 3. Decreased nodular left lower lobe pulmonary opacity favoring resolving pneumonia. 4. Aortic Atherosclerosis (ICD10-I70.0). Electronically Signed   By: Logan Bores M.D.   On: 12/16/2020 13:56   DG Chest 2 View  Result Date: 12/16/2020 CLINICAL DATA:  87 year old male with history of cough and shortness of breath. EXAM: CHEST - 2 VIEW COMPARISON:  Chest x-ray 12/11/2020. FINDINGS: Calcified pleural plaques are again noted in the thorax bilaterally indicative of asbestos related pleural disease. Lung volumes are low. Chronic elevation of the left hemidiaphragm, similar to prior studies. No consolidative airspace disease. No pleural effusions. No pneumothorax. No pulmonary nodule or mass noted. Pulmonary vasculature  and the cardiomediastinal silhouette are within normal limits. Atherosclerotic calcifications in the thoracic aorta. IMPRESSION: 1. Low lung volumes without radiographic evidence of acute cardiopulmonary disease. 2. Aortic atherosclerosis. 3. Asbestos related pleural disease redemonstrated. Electronically Signed   By: Vinnie Langton M.D.   On: 12/16/2020 13:54   DG Abd 1 View  Result Date: 12/21/2020 CLINICAL DATA:  NG tube placement EXAM: ABDOMEN - 1 VIEW COMPARISON:  Radiograph 12/21/2020 FINDINGS: Interval placement of a transesophageal tube with the tip and side port distal to the GE junction, terminating near the level of the gastric antrum. Decompression of the previously seen gastric distension. Few persistently air filled loops of small bowel are seen in the upper abdomen. Redemonstration of a pigtail pleural drain terminating in the right upper quadrant. Scarring and/or atelectasis in the bilateral lung bases. Calcified pleural plaque along the left base. Stable cardiomediastinal contours multilevel degenerative changes are present in the imaged portions of the spine. No acute osseous abnormality or suspicious osseous lesion. IMPRESSION: Interval placement of a transesophageal tube with the tip and side port distal to the GE junction, terminating near the level of the gastric antrum. Decompression of the previously seen gastric distension. Electronically Signed   By: Lovena Le M.D.   On: 12/21/2020 02:43   DG Abd 1 View  Result Date: 12/21/2020 CLINICAL DATA:  Abdominal distension EXAM: ABDOMEN - 1 VIEW COMPARISON:  12/20/2020 FINDINGS: Increasing gaseous distention of the stomach when compared to recent radiographs. Additional loops of air-filled small bowel are seen throughout the mid abdomen. Large colonic stool burden is noted as well with fecal material throughout the colon and rectal vault. Vascular calcifications project over the abdomen and pelvis. Prostate brachytherapy implants are  noted. The osseous structures appear diffusely demineralized which may limit detection of small or nondisplaced fractures. No acute osseous abnormality or suspicious osseous lesion. Atelectatic changes in the lung bases. Pigtail catheter terminates in the right upper quadrant. IMPRESSION: 1. Increasing gaseous distention of the stomach when compared to recent radiographs, consider nasogastric decompression. 2. Additional air-filled loops of small bowel are clustered in the mid abdomen, favor ileus given lack of distal decompression with a large colonic stool burden throughout the colon and rectal vault. 3. Pigtail catheter remains in the right upper quadrant. Electronically Signed   By: Lovena Le M.D.   On: 12/21/2020 01:36   [place] Intraoperative  Result Date: 12/09/2020 CLINICAL DATA:  Ultrasound was provided for use by the ordering physician.  No provider Interpretation or professional fees incurred.    CT ABDOMEN PELVIS W CONTRAST  Result Date: 12/08/2020 CLINICAL DATA:  Abdominal pain with distension constipation, bowel obstruction suspected. EXAM: CT ABDOMEN AND PELVIS WITH CONTRAST TECHNIQUE: Multidetector CT imaging of the abdomen and pelvis was performed using the standard protocol following bolus administration of intravenous contrast. CONTRAST:  174mL OMNIPAQUE IOHEXOL 300 MG/ML  SOLN COMPARISON:  Chest CT March 06, 2019 FINDINGS: Lower chest: New nodular left lower lobe ground-glass opacity. Bibasilar bronchiectasis with areas of mucoid impaction. Bilateral calcified pleural plaques. Normal size heart. Coronary artery calcifications. Mitral annulus calcifications. No significant pericardial effusion or thickening. Hepatobiliary: No suspicious hepatic lesion. Gallbladder is distended with gallbladder wall thickening and pericholecystic fluid. No biliary ductal dilation. Pancreas: Unremarkable. Spleen: Unremarkable Adrenals/Urinary Tract: Bilateral adrenal glands are unremarkable. No hydronephrosis.  Bilateral cortical renal scarring. Bilateral tiny subcentimeter hypodense renal lesions which are technically too small to accurately characterize but favored represent cysts. Postsurgical changes of urolift procedure. Mild trabecular thickening of the urinary bladder. Right posterior bladder diverticulum versus pseudo ureterocele on image 82/2. Stomach/Bowel: Stomach is decompressed limiting evaluation. Normal positioning of the duodenum/ligament of [place]. No evidence of bowel obstruction. The appendix is surgically absent per report. Moderate volume of formed stool throughout the colon. Colonic diverticulosis without findings of acute diverticulitis. Vascular/Lymphatic: Aortic atherosclerosis. No enlarged abdominal or pelvic lymph nodes. Reproductive: Prostate is present with changes of recent urolift procedure. Other: No abdominopelvic ascites. Musculoskeletal: Multilevel degenerative changes spine. Degenerative change of the bilateral hips. No acute osseous abnormality. IMPRESSION: 1. Distended gallbladder with gallbladder wall thickening and pericholecystic fluid. Findings are concerning for acute cholecystitis. 2. New nodular left lower lobe ground-glass opacity may represent pneumonia recommend follow-up dedicated chest CT in 2-3 months to ensure resolution. 3. Postsurgical changes urolift insertion with nodular thickening of the adjacent right side pelvic musculature, which may be sequela of the procedure. Recommend attention on follow-up imaging. 4. Colonic diverticulosis without findings of acute diverticulitis. 5. Moderate volume of formed stool throughout the colon. 6. Aortic atherosclerosis.  Aortic Atherosclerosis (ICD10-I70.0). These results were called by telephone at the time of interpretation on 12/08/2020 at 11:04 am to provider MARK QUALE , who verbally acknowledged these results. Electronically Signed   By: Dahlia Bailiff MD   On: 12/08/2020 11:06   DG Chest Port 1 View  Result Date:  12/11/2020 CLINICAL DATA:  Hypoxia EXAM: PORTABLE CHEST 1 VIEW COMPARISON:  08/11/2018 FINDINGS: The heart size and mediastinal contours are within normal limits. Probable small bilateral pleural effusions. Mild, diffuse interstitial pulmonary opacity. Calcified pleural plaques project bilaterally. The visualized skeletal structures are unremarkable. IMPRESSION: 1. Mild, diffuse interstitial pulmonary opacity, most consistent with edema. Probable small bilateral pleural effusions. No focal airspace opacity. 2. Calcified pleural plaques project bilaterally, consistent with prior asbestos exposure. Electronically Signed   By: Eddie Candle M.D.   On: 12/11/2020 13:55   DG Abd 2 Views  Result Date: 12/22/2020 CLINICAL DATA:  Weakness.  Ileus. EXAM: ABDOMEN - 2 VIEW COMPARISON:  12/21/2020. FINDINGS: NG tube noted with tip over the stomach. Right upper quadrant drainage catheter stable position. No bowel distention. Stool noted throughout the colon. Mild left base subsegmental atelectasis. Calcified pleural plaques again noted. Degenerative changes scoliosis lumbar spine. Degenerative changes both hips. Surgical clips in the pelvis. Aortoiliac and visceral atherosclerotic vascular calcification. IMPRESSION: 1. NG tube and right upper quadrant drainage catheter in stable position. 2. No bowel distention. Stool noted throughout the colon. Electronically Signed   By: Marcello Moores  Register   On: 12/22/2020 08:19   DG Abd 2 Views  Result Date: 12/20/2020 CLINICAL DATA:  Weakness, nausea, vomiting. EXAM: ABDOMEN - 2 VIEW COMPARISON:  CT abdomen pelvis dated 12/16/2020 and abdomen radiograph dated 12/08/2020. FINDINGS: Multiple gas-filled loops of small bowel are seen in the mid abdomen without air-fluid levels. The amount of gas-filled loops as increased since 12/08/2020. Stool is seen in the colon and rectum. There is no evidence of free air. No radio-opaque calculi or other significant radiographic abnormality is seen. A  pigtail catheter overlies the right upper quadrant. IMPRESSION: Mild gaseous distention of small bowel without air-fluid levels. This may reflect ileus or early small bowel obstruction. Electronically Signed   By: Zerita Boers M.D.   On: 12/20/2020 15:06   DG Abdomen Acute W/Chest  Result Date: 12/08/2020 CLINICAL DATA:  Distended abdomen EXAM: DG ABDOMEN ACUTE WITH 1 VIEW CHEST COMPARISON:  Chest x-ray 08/11/2018 FINDINGS: Heart size and vascularity normal. Negative for heart failure. Negative for pneumonia. Large calcified plaque overlying the left chest. Calcified pleural plaque also noted on the right. Mild bibasilar atelectasis. Normal bowel gas pattern. Negative for bowel obstruction or ileus. No free air. No urinary tract calculi. Surgical clips in the region of the prostate. Mild degenerative change lumbar spine. IMPRESSION: Mild bibasilar atelectasis. Bilateral calcified pleural plaques, chronic Normal bowel gas pattern. Electronically Signed   By: Franchot Gallo M.D.   On: 12/08/2020 10:00   ECHOCARDIOGRAM COMPLETE  Result Date: 12/20/2020    ECHOCARDIOGRAM REPORT   Patient Name:   Lonnie [name]. Date of Exam: 12/19/2020 Medical Rec #:  [id number]          Height:       72.0 in Accession #:    [id number]         Weight:       183.9 lb Date of Birth:  03/04/1927           BSA:          2.056 m Patient Age:    38 years           BP:           129/50 mmHg Patient Gender: M                  HR:           84 bpm. Exam Location:  [facility] Procedure: 2D Echo Indications:     Dyspnea R06.00  History:         Patient has prior history of Echocardiogram examinations, most                  recent 07/16/2018.  Sonographer:     Arville Go RDCS Referring Phys:  [id number] Loletha Grayer Diagnosing Phys: Serafina Royals MD  Sonographer Comments: Suboptimal parasternal window. Image acquisition challenging due to respiratory motion. IMPRESSIONS  1. Left ventricular ejection fraction, by estimation, is 60 to 65%. The  left ventricle has normal function. The left ventricle has no regional wall motion abnormalities. Left ventricular diastolic function could not be evaluated.  2. Right ventricular systolic function is normal. The right ventricular size is normal.  3. The mitral valve is normal in structure. Mild mitral valve regurgitation.  4. The aortic valve is normal in structure. Aortic valve regurgitation is not visualized. FINDINGS  Left Ventricle: Left ventricular ejection fraction, by estimation, is 60 to 65%. The left ventricle has normal function. The left ventricle has no regional wall motion abnormalities. The left ventricular internal cavity size was small. There is no  left ventricular hypertrophy. Left ventricular diastolic function could not be evaluated. Right Ventricle: The right ventricular size is normal. No increase in right ventricular wall thickness. Right ventricular systolic function is normal. Left Atrium: Left atrial size was normal in size. Right Atrium: Right atrial size was normal in size. Pericardium: There is no evidence of pericardial effusion. Mitral Valve: The mitral valve is normal in structure. Mild mitral valve regurgitation. Tricuspid Valve: The tricuspid valve is normal in structure. Tricuspid valve regurgitation is mild. Aortic Valve: The aortic valve is normal in structure. Aortic valve regurgitation is not visualized. Aortic valve peak gradient measures 16.3 mmHg. Pulmonic Valve: The pulmonic valve was normal in structure. Pulmonic valve regurgitation is not visualized. Aorta: The aortic root and ascending aorta are structurally normal, with no evidence of dilitation. IAS/Shunts: No atrial level shunt detected by color flow Doppler.  LEFT VENTRICLE PLAX 2D LVIDd:         3.24 cm  Diastology LVIDs:         2.21 cm  LV e' medial:    6.96 cm/s LV PW:         1.55 cm  LV E/e' medial:  16.8 LV IVS:        1.31 cm  LV e' lateral:   9.57 cm/s LVOT diam:     2.20 cm  LV E/e' lateral: 12.2 LV SV:          84 LV SV Index:   41 LVOT Area:     3.80 cm  RIGHT VENTRICLE RV Basal diam:  2.84 cm RV S prime:     9.57 cm/s TAPSE (M-mode): 2.4 cm LEFT ATRIUM             Index       RIGHT ATRIUM           Index LA diam:        3.30 cm 1.61 cm/m  RA Area:     15.70 cm LA Vol (A2C):   27.1 ml 13.18 ml/m RA Volume:   47.20 ml  22.96 ml/m LA Vol (A4C):   31.0 ml 15.08 ml/m LA Biplane Vol: 31.4 ml 15.27 ml/m  AORTIC VALVE AV Area (Vmax): 2.26 cm AV Vmax:        202.00 cm/s AV Peak Grad:   16.3 mmHg LVOT Vmax:      120.00 cm/s LVOT Vmean:     72.800 cm/s LVOT VTI:       0.222 m  AORTA Ao Root diam: 3.00 cm MITRAL VALVE                TRICUSPID VALVE MV Area (PHT): 2.76 cm     TV Peak grad:   30.9 mmHg MV Decel Time: 275 msec     TV Vmax:        2.78 m/s MV E velocity: 117.00 cm/s MV A velocity: 141.00 cm/s  SHUNTS MV E/A ratio:  0.83         Systemic VTI:  0.22 m                             Systemic Diam: 2.20 cm Serafina Royals MD Electronically signed by Serafina Royals MD Signature Date/Time: 12/20/2020/6:38:28 AM    Final    CT IMAGE GUIDED DRAINAGE BY PERCUTANEOUS CATHETER  Result Date: 12/09/2020 INDICATION: Acute cholecystitis. Poor operative candidate. Please perform image guided cholecystostomy tube placement for infection source control purposes. EXAM:  CT AND FLUOROSCOPIC-GUIDED CHOLECYSTOSTOMY TUBE PLACEMENT COMPARISON:  CT abdomen and pelvis-12/08/2020; right upper quadrant abdominal ultrasound-12/08/2020 MEDICATIONS: The patient is currently admitted to the hospital and on intravenous antibiotics. Antibiotics were administered within an appropriate time frame prior to skin puncture. ANESTHESIA/SEDATION: Moderate (conscious) sedation was employed during this procedure. A total of Versed 1 mg and Fentanyl 100 mcg was administered intravenously. Moderate Sedation Time: 23 minutes. The patient's level of consciousness and vital signs were monitored continuously by radiology nursing throughout the procedure under  my direct supervision. CONTRAST:  None FLUOROSCOPY TIME:  Not provided COMPLICATIONS: None immediate. PROCEDURE: Informed written consent was obtained from the patient after a discussion of the risks, benefits and alternatives to treatment. Questions regarding the procedure were encouraged and answered. A timeout was performed prior to the initiation of the procedure. Patient was positioned supine on the CT gantry and noncontrast images were obtained of the abdomen demonstrating similar appearance of the mildly distended gallbladder with associated gallbladder wall thickening and minimal amount of pericholecystic stranding. The gallbladder was then identified sonographically. The right upper abdominal quadrant was prepped and draped in the usual sterile fashion, and a sterile drape was applied covering the operative field. Maximum barrier sterile technique with sterile gowns and gloves were used for the procedure. A timeout was performed prior to the initiation of the procedure. Local anesthesia was provided with 1% lidocaine with epinephrine. Utilizing a transhepatic approach, an 18 gauge needle was advanced into the gallbladder under direct ultrasound guidance. An ultrasound image was saved for documentation purposes. Appropriate position was confirmed with CT imaging Next, the track was dilated allowing placement of a 10.2-French Cook cholecystomy tube was advanced into the gallbladder lumen, coiled and locked. A small amount of aspirated bile was capped and sent to the laboratory for analysis. The catheter was secured to the skin with suture, connected to a drainage bag and a dressing was placed. The patient tolerated the procedure well without immediate post procedural complication. IMPRESSION: Successful ultrasound and CT guided placement of a 10.2 French cholecystostomy tube. Small amount of aspirated bile was sent to the laboratory for analysis. Electronically Signed   By: Sandi Mariscal M.D.   On: 12/09/2020  12:48   US ABDOMEN LIMITED RUQ (LIVER/GB)  Result Date: 12/08/2020 CLINICAL DATA:  Abdominal pain for the past day. EXAM: ULTRASOUND ABDOMEN LIMITED RIGHT UPPER QUADRANT COMPARISON:  CT abdomen pelvis from same day. FINDINGS: Gallbladder: Gallbladder wall thickening and pericholecystic fluid. No gallstones. No sonographic Murphy sign noted by sonographer. Common bile duct: Diameter: 3 mm, normal. Liver: No focal lesion identified. Within normal limits in parenchymal echogenicity. Portal vein is patent on color Doppler imaging with normal direction of blood flow towards the liver. Other: None. IMPRESSION: 1. Gallbladder wall thickening and pericholecystic fluid without cholelithiasis, concerning for acalculus cholecystitis. Electronically Signed   By: Titus Dubin M.D.   On: 12/08/2020 15:04     Subjective: Pt c/o his food being cold    Discharge Exam: Vitals:   12/24/20 0342 12/24/20 0747  BP: (!) 153/57 (!) 151/62  Pulse: 73 81  Resp: 20   Temp: 97.8 F (36.6 C) 98.6 F (37 C)  SpO2: 98% 97%   Vitals:   12/23/20 1529 12/23/20 1938 12/24/20 0342 12/24/20 0747  BP: (!) 166/74 (!) 150/67 (!) 153/57 (!) 151/62  Pulse: 76 76 73 81  Resp: 17 18 20    Temp: 30.1 F (36.4 C) (!) 97.5 F (36.4 C) 97.8 F (36.6 C) 98.6 F (37  C)  TempSrc:  Oral Oral Oral  SpO2: 98% 100% 98% 97%  Weight:      Height:        General: Pt is alert, awake, not in acute distress Cardiovascular: S1/S2 +, no rubs, no gallops Respiratory: CTA bilaterally, no wheezing, no rhonchi Abdominal: Soft, NT, ND, bowel sounds + Extremities: no cyanosis    The results of significant diagnostics from this hospitalization (including imaging, microbiology, ancillary and laboratory) are listed below for reference.     Microbiology: Recent Results (from the past 240 hour(s))  Blood culture (routine x 2)     Status: None   Collection Time: 12/16/20  3:36 PM   Specimen: BLOOD  Result Value Ref Range Status    Specimen Description BLOOD LEFT ANTECUBITAL  Final   Special Requests   Final    BOTTLES DRAWN AEROBIC AND ANAEROBIC Blood Culture adequate volume   Culture   Final    NO GROWTH 5 DAYS Performed at [facility], [address], [place], [place] [address]    Report Status 12/21/2020 FINAL  Final  Blood culture (routine x 2)     Status: None   Collection Time: 12/16/20  3:36 PM   Specimen: BLOOD  Result Value Ref Range Status   Specimen Description BLOOD BLOOD LEFT FOREARM  Final   Special Requests   Final    BOTTLES DRAWN AEROBIC AND ANAEROBIC Blood Culture adequate volume   Culture   Final    NO GROWTH 5 DAYS Performed at [facility], [address], [place],  [address]    Report Status 12/21/2020 FINAL  Final  SARS CORONAVIRUS 2 (TAT 6-24 HRS) Nasopharyngeal Nasopharyngeal Swab     Status: None   Collection Time: 12/17/20 10:23 AM   Specimen: Nasopharyngeal Swab  Result Value Ref Range Status   SARS Coronavirus 2 NEGATIVE NEGATIVE Final    Comment: (NOTE) SARS-CoV-2 target nucleic acids are NOT DETECTED.  The SARS-CoV-2 RNA is generally detectable in upper and lower respiratory specimens during the acute phase of infection. Negative results do not preclude SARS-CoV-2 infection, do not rule out co-infections with other pathogens, and should not be used as the sole basis for treatment or other patient management decisions. Negative results must be combined with clinical observations, patient history, and epidemiological information. The expected result is Negative.  Fact Sheet for Patients: SugarRoll.be  Fact Sheet for Healthcare Providers: https://www.woods-mathews.com/  This test is not yet approved or cleared by the [place] FDA and  has been authorized for detection and/or diagnosis of SARS-CoV-2 by FDA under an Emergency Use Authorization (EUA). This EUA will remain  in effect (meaning this test  can be used) for the duration of the COVID-19 declaration under Se ction 564(b)(1) of the Act, 21 U.S.C. section 360bbb-3(b)(1), unless the authorization is terminated or revoked sooner.  Performed at [facility], [place] [address], [place], [place] [address]   SARS CORONAVIRUS 2 (TAT 6-24 HRS) Nasopharyngeal Nasopharyngeal Swab     Status: None   Collection Time: 12/23/20  4:16 PM   Specimen: Nasopharyngeal Swab  Result Value Ref Range Status   SARS Coronavirus 2 NEGATIVE NEGATIVE Final    Comment: (NOTE) SARS-CoV-2 target nucleic acids are NOT DETECTED.  The SARS-CoV-2 RNA is generally detectable in upper and lower respiratory specimens during the acute phase of infection. Negative results do not preclude SARS-CoV-2 infection, do not rule out co-infections with other pathogens, and should not be used as the  sole basis for treatment or other patient management decisions. Negative results must be combined with clinical observations, patient history, and epidemiological information. The expected result is Negative.  Fact Sheet for Patients: SugarRoll.be  Fact Sheet for Healthcare Providers: https://www.woods-mathews.com/  This test is not yet approved or cleared by the [place] FDA and  has been authorized for detection and/or diagnosis of SARS-CoV-2 by FDA under an Emergency Use Authorization (EUA). This EUA will remain  in effect (meaning this test can be used) for the duration of the COVID-19 declaration under Se ction 564(b)(1) of the Act, 21 U.S.C. section 360bbb-3(b)(1), unless the authorization is terminated or revoked sooner.  Performed at [facility], [place] [address] [place], [place] [address]      Labs: BNP (last 3 results) No results for input(s): BNP in the last 8760 hours. Basic Metabolic Panel: Recent Labs  Lab 12/20/20 0813 12/21/20 0615 12/22/20 0444 12/23/20 0522 12/24/20 0528  NA 140 142  143 142 141  K 4.6 4.1 3.9 3.6 3.8  CL 118* 120* 121* 120* 118*  CO2 15* 16* 17* 17* 17*  GLUCOSE 120* 104* 74 94 93  BUN 54* 55* 49* 39* 31*  CREATININE 2.01* 1.85* 1.59* 1.36* 1.35*  CALCIUM 8.6* 8.6* 8.6* 8.5* 8.7*   Liver Function Tests: Recent Labs  Lab 12/18/20 0732 12/22/20 0444  AST 20 23  ALT 20 27  ALKPHOS 60 81  BILITOT 0.7 0.7  PROT 5.5* 5.9*  ALBUMIN 2.5* 2.5*   Recent Labs  Lab 12/19/20 0723 12/20/20 0813 12/21/20 0615 12/22/20 0444 12/23/20 0522  LIPASE 81* 95* 76* 88* 105*   No results for input(s): AMMONIA in the last 168 hours. CBC: Recent Labs  Lab 12/19/20 0723 12/21/20 0615 12/22/20 0444 12/23/20 0522 12/24/20 0528  WBC 14.8* 10.8* 10.4 10.8* 11.1*  HGB 9.1* 8.2* 9.1* 9.1* 9.4*  HCT 26.4* 25.2* 26.7* 26.7* 27.7*  MCV 98.1 100.0 97.1 96.4 97.5  PLT 212 238 272 262 266   Cardiac Enzymes: No results for input(s): CKTOTAL, CKMB, CKMBINDEX, TROPONINI in the last 168 hours. BNP: Invalid input(s): POCBNP CBG: No results for input(s): GLUCAP in the last 168 hours. D-Dimer No results for input(s): DDIMER in the last 72 hours. Hgb A1c No results for input(s): HGBA1C in the last 72 hours. Lipid Profile No results for input(s): CHOL, HDL, LDLCALC, TRIG, CHOLHDL, LDLDIRECT in the last 72 hours. Thyroid function studies No results for input(s): TSH, T4TOTAL, T3FREE, THYROIDAB in the last 72 hours.  Invalid input(s): FREET3 Anemia work up Recent Labs    12/23/20 0522  FERRITIN 706*   Urinalysis    Component Value Date/Time   COLORURINE YELLOW 12/16/2020 [place] 12/16/2020 1535   APPEARANCEUR Hazy (A) 09/28/2020 1313   LABSPEC 1.020 12/16/2020 1535   LABSPEC 1.017 09/29/2012 2058   [place] 5.0 12/16/2020 1535   GLUCOSEU NEGATIVE 12/16/2020 1535   GLUCOSEU Negative 09/29/2012 2058   HGBUR NEGATIVE 12/16/2020 1535   BILIRUBINUR NEGATIVE 12/16/2020 1535   BILIRUBINUR Negative 09/28/2020 1313   BILIRUBINUR Negative  09/29/2012 2058   KETONESUR TRACE (A) 12/16/2020 1535   PROTEINUR 30 (A) 12/16/2020 1535   NITRITE NEGATIVE 12/16/2020 1535   LEUKOCYTESUR TRACE (A) 12/16/2020 1535   LEUKOCYTESUR Negative 09/29/2012 2058   Sepsis Labs Invalid input(s): PROCALCITONIN,  WBC,  LACTICIDVEN Microbiology Recent Results (from the past 240 hour(s))  Blood culture (routine x 2)     Status: None   Collection Time: 12/16/20  3:36 PM   Specimen: BLOOD  Result Value Ref Range Status   Specimen Description BLOOD LEFT ANTECUBITAL  Final   Special Requests   Final    BOTTLES DRAWN AEROBIC AND ANAEROBIC Blood Culture adequate volume   Culture   Final    NO GROWTH 5 DAYS Performed at [facility], [address], [place], [place] [address]    Report Status 12/21/2020 FINAL  Final  Blood culture (routine x 2)     Status: None   Collection Time: 12/16/20  3:36 PM   Specimen: BLOOD  Result Value Ref Range Status   Specimen Description BLOOD BLOOD LEFT FOREARM  Final   Special Requests   Final    BOTTLES DRAWN AEROBIC AND ANAEROBIC Blood Culture adequate volume   Culture   Final    NO GROWTH 5 DAYS Performed at [facility], [address], [place], [place] [address]    Report Status 12/21/2020 FINAL  Final  SARS CORONAVIRUS 2 (TAT 6-24 HRS) Nasopharyngeal Nasopharyngeal Swab     Status: None   Collection Time: 12/17/20 10:23 AM   Specimen: Nasopharyngeal Swab  Result Value Ref Range Status   SARS Coronavirus 2 NEGATIVE NEGATIVE Final    Comment: (NOTE) SARS-CoV-2 target nucleic acids are NOT DETECTED.  The SARS-CoV-2 RNA is generally detectable in upper and lower respiratory specimens during the acute phase of infection. Negative results do not preclude SARS-CoV-2 infection, do not rule out co-infections with other pathogens, and should not be used as the sole basis for treatment or other patient management decisions. Negative results must be combined with clinical  observations, patient history, and epidemiological information. The expected result is Negative.  Fact Sheet for Patients: SugarRoll.be  Fact Sheet for Healthcare Providers: https://www.woods-mathews.com/  This test is not yet approved or cleared by the [place] FDA and  has been authorized for detection and/or diagnosis of SARS-CoV-2 by FDA under an Emergency Use Authorization (EUA). This EUA will remain  in effect (meaning this test can be used) for the duration of the COVID-19 declaration under Se ction 564(b)(1) of the Act, 21 U.S.C. section 360bbb-3(b)(1), unless the authorization is terminated or revoked sooner.  Performed at [facility], [place] [address] [place], [place] [address]   SARS CORONAVIRUS 2 (TAT 6-24 HRS) Nasopharyngeal Nasopharyngeal Swab     Status: None   Collection Time: 12/23/20  4:16 PM   Specimen: Nasopharyngeal Swab  Result Value Ref Range Status   SARS Coronavirus 2 NEGATIVE NEGATIVE Final    Comment: (NOTE) SARS-CoV-2 target nucleic acids are NOT DETECTED.  The SARS-CoV-2 RNA is generally detectable in upper and lower respiratory specimens during the acute phase of infection. Negative results do not preclude SARS-CoV-2 infection, do not rule out co-infections with other pathogens, and should not be used as the sole basis for treatment or other patient management decisions. Negative results must be combined with clinical observations, patient history, and epidemiological information. The expected result is Negative.  Fact Sheet for Patients: SugarRoll.be  Fact Sheet for Healthcare Providers: https://www.woods-mathews.com/  This test is not yet approved or cleared by the [place] FDA and  has been authorized for detection and/or diagnosis of SARS-CoV-2 by FDA under an Emergency Use Authorization (EUA). This EUA will remain  in effect (meaning this  test can be used) for the duration of the COVID-19 declaration under Se ction 564(b)(1) of the Act, 21 U.S.C. section 360bbb-3(b)(1), unless the authorization is terminated or revoked sooner.  Performed at [facility], [place] [address] [place], [place] [address]      Time coordinating discharge: Over 30 minutes  SIGNED:   Wyvonnia Dusky, MD  [facility] 12/24/2020, 10:43 AM Pager   If 7PM-7AM, please contact night-coverage

## 2020-12-24 NOTE — Plan of Care (Signed)

## 2020-12-25 ENCOUNTER — Other Ambulatory Visit: Payer: Self-pay | Admitting: General Surgery

## 2020-12-25 DIAGNOSIS — T8579XA Infection and inflammatory reaction due to other internal prosthetic devices, implants and grafts, initial encounter: Secondary | ICD-10-CM

## 2020-12-28 DIAGNOSIS — K219 Gastro-esophageal reflux disease without esophagitis: Secondary | ICD-10-CM

## 2020-12-28 DIAGNOSIS — R531 Weakness: Secondary | ICD-10-CM | POA: Diagnosis not present

## 2020-12-28 DIAGNOSIS — K819 Cholecystitis, unspecified: Secondary | ICD-10-CM | POA: Diagnosis not present

## 2020-12-28 DIAGNOSIS — N1832 Chronic kidney disease, stage 3b: Secondary | ICD-10-CM

## 2020-12-28 DIAGNOSIS — G609 Hereditary and idiopathic neuropathy, unspecified: Secondary | ICD-10-CM | POA: Diagnosis not present

## 2020-12-28 DIAGNOSIS — I10 Essential (primary) hypertension: Secondary | ICD-10-CM | POA: Diagnosis not present

## 2020-12-30 DIAGNOSIS — C44219 Basal cell carcinoma of skin of left ear and external auricular canal: Secondary | ICD-10-CM | POA: Diagnosis not present

## 2020-12-31 ENCOUNTER — Other Ambulatory Visit: Payer: Self-pay

## 2020-12-31 ENCOUNTER — Telehealth (INDEPENDENT_AMBULATORY_CARE_PROVIDER_SITE_OTHER): Payer: No Typology Code available for payment source | Admitting: Dermatology

## 2020-12-31 ENCOUNTER — Ambulatory Visit: Payer: No Typology Code available for payment source | Admitting: Dermatology

## 2020-12-31 DIAGNOSIS — Z85828 Personal history of other malignant neoplasm of skin: Secondary | ICD-10-CM

## 2020-12-31 DIAGNOSIS — L578 Other skin changes due to chronic exposure to nonionizing radiation: Secondary | ICD-10-CM

## 2020-12-31 NOTE — Progress Notes (Addendum)
   Follow-Up Visit Virtual visit via Video Note today. Verified patient is Lonnie [name]. Patient located in [place], [place] Physician located in [place], [place] Patient voices understanding their insurance will be billed just like for a regular in office visit  Virtual visit duration 15 minutes    Subjective  Lonnie [name]. is a 87 y.o. male who presents for the following: Wound Check (Daughter is concerned with healing of surgery site for excision of BCC at left ear posterior and would like to get it checked. ).  The following portions of the chart were reviewed this encounter and updated as appropriate:  Tobacco  Allergies  Meds  Problems  Med Hx  Surg Hx  Fam Hx     Review of Systems: No other skin or systemic complaints except as noted in HPI or Assessment and Plan.  Objective  Well appearing patient in no apparent distress; mood and affect are within normal limits.  A focused examination was performed including left ear. Relevant physical exam findings are noted in the Assessment and Plan.  Objective  Left Ear: 30 days post excision of BCC.  Site of excision Left posterior ear shows ulcer without evidence of infection.  Assessment & Plan  History of basal cell carcinoma (BCC) Left Ear Healing ulcer.  No obvious infection today. Apply mupirocin with dressing change daily. Pt has.  May start Doxycycline 100 mg BID x 7 days if any signs of infection. Pt has.  Reviewed with patient and daughter.  They understand. Reassured and answered questions.  Actinic Damage - chronic, secondary to cumulative UV radiation exposure/sun exposure over time - diffuse scaly erythematous macules with underlying dyspigmentation - Recommend daily broad spectrum sunscreen SPF 30+ to sun-exposed areas, reapply every 2 hours as needed.  - Recommend staying in the shade or wearing long sleeves, sun glasses (UVA+UVB protection) and wide brim hats (4-inch brim around the entire  circumference of the hat). - Call for new or changing lesions.  Return in 13 days (on 01/13/2021) or as scheduled.   IHarriett Sine, CMA, am acting as scribe for Sarina Ser, MD. Documentation: I have reviewed the above documentation for accuracy and completeness, and I agree with the above.  Sarina Ser, MD

## 2021-01-01 ENCOUNTER — Encounter: Payer: Self-pay | Admitting: Dermatology

## 2021-01-07 DIAGNOSIS — E441 Mild protein-calorie malnutrition: Secondary | ICD-10-CM | POA: Diagnosis not present

## 2021-01-11 DIAGNOSIS — S90119A Contusion of unspecified great toe without damage to nail, initial encounter: Secondary | ICD-10-CM | POA: Diagnosis not present

## 2021-01-12 ENCOUNTER — Other Ambulatory Visit: Payer: Self-pay

## 2021-01-12 ENCOUNTER — Emergency Department: Payer: No Typology Code available for payment source

## 2021-01-12 ENCOUNTER — Inpatient Hospital Stay
Admission: EM | Admit: 2021-01-12 | Discharge: 2021-01-22 | DRG: 871 | Disposition: A | Discharge status: Hospice | Payer: No Typology Code available for payment source | Source: Skilled Nursing Facility | Attending: Internal Medicine | Admitting: Internal Medicine

## 2021-01-12 DIAGNOSIS — E785 Hyperlipidemia, unspecified: Secondary | ICD-10-CM | POA: Diagnosis present

## 2021-01-12 DIAGNOSIS — G629 Polyneuropathy, unspecified: Secondary | ICD-10-CM | POA: Diagnosis present

## 2021-01-12 DIAGNOSIS — R627 Adult failure to thrive: Secondary | ICD-10-CM | POA: Diagnosis present

## 2021-01-12 DIAGNOSIS — R54 Age-related physical debility: Secondary | ICD-10-CM | POA: Diagnosis present

## 2021-01-12 DIAGNOSIS — Z20822 Contact with and (suspected) exposure to covid-19: Secondary | ICD-10-CM | POA: Diagnosis present

## 2021-01-12 DIAGNOSIS — G9341 Metabolic encephalopathy: Secondary | ICD-10-CM | POA: Diagnosis present

## 2021-01-12 DIAGNOSIS — E872 Acidosis, unspecified: Secondary | ICD-10-CM | POA: Diagnosis present

## 2021-01-12 DIAGNOSIS — Z515 Encounter for palliative care: Secondary | ICD-10-CM

## 2021-01-12 DIAGNOSIS — I700 Atherosclerosis of aorta: Secondary | ICD-10-CM | POA: Diagnosis present

## 2021-01-12 DIAGNOSIS — N179 Acute kidney failure, unspecified: Secondary | ICD-10-CM | POA: Diagnosis present

## 2021-01-12 DIAGNOSIS — N183 Chronic kidney disease, stage 3 unspecified: Secondary | ICD-10-CM | POA: Diagnosis present

## 2021-01-12 DIAGNOSIS — Z8701 Personal history of pneumonia (recurrent): Secondary | ICD-10-CM

## 2021-01-12 DIAGNOSIS — K219 Gastro-esophageal reflux disease without esophagitis: Secondary | ICD-10-CM | POA: Diagnosis present

## 2021-01-12 DIAGNOSIS — Z7951 Long term (current) use of inhaled steroids: Secondary | ICD-10-CM

## 2021-01-12 DIAGNOSIS — Z8249 Family history of ischemic heart disease and other diseases of the circulatory system: Secondary | ICD-10-CM

## 2021-01-12 DIAGNOSIS — Z880 Allergy status to penicillin: Secondary | ICD-10-CM

## 2021-01-12 DIAGNOSIS — J9601 Acute respiratory failure with hypoxia: Secondary | ICD-10-CM | POA: Diagnosis present

## 2021-01-12 DIAGNOSIS — J440 Chronic obstructive pulmonary disease with acute lower respiratory infection: Secondary | ICD-10-CM | POA: Diagnosis present

## 2021-01-12 DIAGNOSIS — F05 Delirium due to known physiological condition: Secondary | ICD-10-CM | POA: Diagnosis not present

## 2021-01-12 DIAGNOSIS — A419 Sepsis, unspecified organism: Principal | ICD-10-CM | POA: Diagnosis present

## 2021-01-12 DIAGNOSIS — J189 Pneumonia, unspecified organism: Secondary | ICD-10-CM | POA: Diagnosis present

## 2021-01-12 DIAGNOSIS — Z79899 Other long term (current) drug therapy: Secondary | ICD-10-CM

## 2021-01-12 DIAGNOSIS — N390 Urinary tract infection, site not specified: Secondary | ICD-10-CM | POA: Diagnosis not present

## 2021-01-12 DIAGNOSIS — I129 Hypertensive chronic kidney disease with stage 1 through stage 4 chronic kidney disease, or unspecified chronic kidney disease: Secondary | ICD-10-CM | POA: Diagnosis present

## 2021-01-12 DIAGNOSIS — F4321 Adjustment disorder with depressed mood: Secondary | ICD-10-CM | POA: Diagnosis present

## 2021-01-12 DIAGNOSIS — Z66 Do not resuscitate: Secondary | ICD-10-CM | POA: Diagnosis not present

## 2021-01-12 DIAGNOSIS — R45851 Suicidal ideations: Secondary | ICD-10-CM | POA: Diagnosis present

## 2021-01-12 DIAGNOSIS — R4182 Altered mental status, unspecified: Secondary | ICD-10-CM | POA: Diagnosis present

## 2021-01-12 DIAGNOSIS — Z882 Allergy status to sulfonamides status: Secondary | ICD-10-CM

## 2021-01-12 DIAGNOSIS — E871 Hypo-osmolality and hyponatremia: Secondary | ICD-10-CM | POA: Diagnosis present

## 2021-01-12 DIAGNOSIS — I447 Left bundle-branch block, unspecified: Secondary | ICD-10-CM | POA: Diagnosis present

## 2021-01-12 DIAGNOSIS — E875 Hyperkalemia: Secondary | ICD-10-CM | POA: Diagnosis present

## 2021-01-12 DIAGNOSIS — M109 Gout, unspecified: Secondary | ICD-10-CM | POA: Diagnosis present

## 2021-01-12 DIAGNOSIS — Z77090 Contact with and (suspected) exposure to asbestos: Secondary | ICD-10-CM | POA: Diagnosis present

## 2021-01-12 DIAGNOSIS — Z7189 Other specified counseling: Secondary | ICD-10-CM | POA: Diagnosis not present

## 2021-01-12 DIAGNOSIS — Z87891 Personal history of nicotine dependence: Secondary | ICD-10-CM

## 2021-01-12 DIAGNOSIS — E43 Unspecified severe protein-calorie malnutrition: Secondary | ICD-10-CM | POA: Insufficient documentation

## 2021-01-12 DIAGNOSIS — J690 Pneumonitis due to inhalation of food and vomit: Secondary | ICD-10-CM | POA: Diagnosis present

## 2021-01-12 DIAGNOSIS — J920 Pleural plaque with presence of asbestos: Secondary | ICD-10-CM | POA: Diagnosis present

## 2021-01-12 DIAGNOSIS — Z6825 Body mass index (BMI) 25.0-25.9, adult: Secondary | ICD-10-CM

## 2021-01-12 DIAGNOSIS — Z85828 Personal history of other malignant neoplasm of skin: Secondary | ICD-10-CM

## 2021-01-12 DIAGNOSIS — Z7982 Long term (current) use of aspirin: Secondary | ICD-10-CM

## 2021-01-12 DIAGNOSIS — N189 Chronic kidney disease, unspecified: Secondary | ICD-10-CM | POA: Diagnosis present

## 2021-01-12 DIAGNOSIS — E860 Dehydration: Secondary | ICD-10-CM | POA: Diagnosis not present

## 2021-01-12 DIAGNOSIS — L8990 Pressure ulcer of unspecified site, unspecified stage: Secondary | ICD-10-CM | POA: Insufficient documentation

## 2021-01-12 DIAGNOSIS — N3090 Cystitis, unspecified without hematuria: Secondary | ICD-10-CM | POA: Diagnosis present

## 2021-01-12 DIAGNOSIS — J9600 Acute respiratory failure, unspecified whether with hypoxia or hypercapnia: Secondary | ICD-10-CM | POA: Diagnosis present

## 2021-01-12 DIAGNOSIS — K5900 Constipation, unspecified: Secondary | ICD-10-CM | POA: Diagnosis present

## 2021-01-12 DIAGNOSIS — I2510 Atherosclerotic heart disease of native coronary artery without angina pectoris: Secondary | ICD-10-CM | POA: Diagnosis present

## 2021-01-12 DIAGNOSIS — L89322 Pressure ulcer of left buttock, stage 2: Secondary | ICD-10-CM | POA: Diagnosis present

## 2021-01-12 DIAGNOSIS — N320 Bladder-neck obstruction: Secondary | ICD-10-CM | POA: Diagnosis present

## 2021-01-12 DIAGNOSIS — D631 Anemia in chronic kidney disease: Secondary | ICD-10-CM | POA: Diagnosis present

## 2021-01-12 LAB — CBC WITH DIFFERENTIAL/PLATELET
Abs Immature Granulocytes: 0.09 10*3/uL — ABNORMAL HIGH (ref 0.00–0.07)
Basophils Absolute: 0.1 10*3/uL (ref 0.0–0.1)
Basophils Relative: 0 %
Eosinophils Absolute: 0.1 10*3/uL (ref 0.0–0.5)
Eosinophils Relative: 1 %
HCT: 32.8 % — ABNORMAL LOW (ref 39.0–52.0)
Hemoglobin: 10.7 g/dL — ABNORMAL LOW (ref 13.0–17.0)
Immature Granulocytes: 1 %
Lymphocytes Relative: 8 %
Lymphs Abs: 1.3 10*3/uL (ref 0.7–4.0)
MCH: 32.2 pg (ref 26.0–34.0)
MCHC: 32.6 g/dL (ref 30.0–36.0)
MCV: 98.8 fL (ref 80.0–100.0)
Monocytes Absolute: 0.9 10*3/uL (ref 0.1–1.0)
Monocytes Relative: 6 %
Neutro Abs: 12.5 10*3/uL — ABNORMAL HIGH (ref 1.7–7.7)
Neutrophils Relative %: 84 %
Platelets: 226 10*3/uL (ref 150–400)
RBC: 3.32 MIL/uL — ABNORMAL LOW (ref 4.22–5.81)
RDW: 14.1 % (ref 11.5–15.5)
WBC: 14.9 10*3/uL — ABNORMAL HIGH (ref 4.0–10.5)
nRBC: 0 % (ref 0.0–0.2)

## 2021-01-12 LAB — TROPONIN I (HIGH SENSITIVITY)
Troponin I (High Sensitivity): 15 ng/L (ref <18)
Troponin I (High Sensitivity): 13 ng/L (ref <18)

## 2021-01-12 LAB — COMPREHENSIVE METABOLIC PANEL
ALT: 22 U/L (ref 0–44)
AST: 17 U/L (ref 15–41)
Albumin: 3.2 g/dL — ABNORMAL LOW (ref 3.5–5.0)
Alkaline Phosphatase: 100 U/L (ref 38–126)
Anion gap: 10 (ref 5–15)
BUN: 93 mg/dL — ABNORMAL HIGH (ref 8–23)
CO2: 16 mmol/L — ABNORMAL LOW (ref 22–32)
Calcium: 9.5 mg/dL (ref 8.9–10.3)
Chloride: 105 mmol/L (ref 98–111)
Creatinine, Ser: 3.29 mg/dL — ABNORMAL HIGH (ref 0.61–1.24)
GFR, Estimated: 17 mL/min — ABNORMAL LOW (ref >60)
Glucose, Bld: 122 mg/dL — ABNORMAL HIGH (ref 70–99)
Potassium: 5.9 mmol/L — ABNORMAL HIGH (ref 3.5–5.1)
Sodium: 131 mmol/L — ABNORMAL LOW (ref 135–145)
Total Bilirubin: 0.7 mg/dL (ref 0.3–1.2)
Total Protein: 7.8 g/dL (ref 6.5–8.1)

## 2021-01-12 LAB — URINALYSIS, COMPLETE (UACMP) WITH MICROSCOPIC
Bilirubin Urine: NEGATIVE
Glucose, UA: NEGATIVE mg/dL
Hgb urine dipstick: NEGATIVE
Ketones, ur: NEGATIVE mg/dL
Nitrite: NEGATIVE
Protein, ur: 30 mg/dL — AB
Specific Gravity, Urine: 1.015 (ref 1.005–1.030)
WBC, UA: >50 WBC/hpf — ABNORMAL HIGH (ref 0–5)
pH: 5 (ref 5.0–8.0)

## 2021-01-12 LAB — LACTIC ACID, PLASMA: Lactic Acid, Venous: 1.2 mmol/L (ref 0.5–1.9)

## 2021-01-12 MED ORDER — GABAPENTIN 100 MG PO CAPS
100.0000 mg | ORAL_CAPSULE | Freq: Two times a day (BID) | ORAL | Status: DC
  Administered 2021-01-13 – 2021-01-16 (×8): 100 mg via ORAL
  Filled 2021-01-12 (×9): qty 1

## 2021-01-12 MED ORDER — SODIUM CHLORIDE 0.9 % IV SOLN
1.0000 g | INTRAVENOUS | Status: DC
  Administered 2021-01-12 – 2021-01-13 (×2): 1 g via INTRAVENOUS
  Filled 2021-01-12 (×3): qty 1

## 2021-01-12 MED ORDER — SODIUM CHLORIDE 0.9 % IV SOLN
1.0000 g | Freq: Once | INTRAVENOUS | Status: AC
  Administered 2021-01-12: 1 g via INTRAVENOUS
  Filled 2021-01-12: qty 10

## 2021-01-12 MED ORDER — SODIUM ZIRCONIUM CYCLOSILICATE 10 G PO PACK
10.0000 g | PACK | Freq: Once | ORAL | Status: AC
  Administered 2021-01-12: 10 g via ORAL
  Filled 2021-01-12: qty 1

## 2021-01-12 MED ORDER — PANTOPRAZOLE SODIUM 40 MG PO TBEC
40.0000 mg | DELAYED_RELEASE_TABLET | Freq: Every day | ORAL | Status: DC
  Administered 2021-01-13 – 2021-01-22 (×8): 40 mg via ORAL
  Filled 2021-01-12 (×10): qty 1

## 2021-01-12 MED ORDER — ASPIRIN EC 81 MG PO TBEC
81.0000 mg | DELAYED_RELEASE_TABLET | Freq: Every day | ORAL | Status: DC
  Administered 2021-01-13 – 2021-01-15 (×3): 81 mg via ORAL
  Filled 2021-01-12 (×4): qty 1

## 2021-01-12 MED ORDER — MAGNESIUM OXIDE 400 (241.3 MG) MG PO TABS
400.0000 mg | ORAL_TABLET | ORAL | Status: DC
  Administered 2021-01-13 – 2021-01-15 (×3): 400 mg via ORAL
  Filled 2021-01-12 (×4): qty 1

## 2021-01-12 MED ORDER — MOMETASONE FURO-FORMOTEROL FUM 200-5 MCG/ACT IN AERO
2.0000 | INHALATION_SPRAY | Freq: Two times a day (BID) | RESPIRATORY_TRACT | Status: DC
  Administered 2021-01-13 – 2021-01-21 (×16): 2 via RESPIRATORY_TRACT
  Filled 2021-01-12 (×2): qty 8.8

## 2021-01-12 MED ORDER — NYSTATIN 100000 UNIT/GM EX POWD
Freq: Three times a day (TID) | CUTANEOUS | Status: DC
  Filled 2021-01-12 (×2): qty 15

## 2021-01-12 MED ORDER — ONDANSETRON HCL 4 MG PO TABS: 4.0000 mg | ORAL_TABLET | Freq: Four times a day (QID) | ORAL | Status: DC | PRN

## 2021-01-12 MED ORDER — ONDANSETRON HCL 4 MG/2ML IJ SOLN
4.0000 mg | Freq: Four times a day (QID) | INTRAMUSCULAR | Status: DC | PRN
  Administered 2021-01-16: 4 mg via INTRAVENOUS
  Filled 2021-01-12: qty 2

## 2021-01-12 MED ORDER — ALLOPURINOL 300 MG PO TABS
300.0000 mg | ORAL_TABLET | ORAL | Status: DC
  Administered 2021-01-13: 09:00:00 300 mg via ORAL
  Filled 2021-01-12: qty 1
  Filled 2021-01-12: qty 3

## 2021-01-12 MED ORDER — FINASTERIDE 5 MG PO TABS
5.0000 mg | ORAL_TABLET | ORAL | Status: DC
  Administered 2021-01-13 – 2021-01-22 (×8): 5 mg via ORAL
  Filled 2021-01-12 (×9): qty 1

## 2021-01-12 MED ORDER — ATORVASTATIN CALCIUM 20 MG PO TABS
40.0000 mg | ORAL_TABLET | ORAL | Status: DC
  Administered 2021-01-13 – 2021-01-15 (×3): 40 mg via ORAL
  Filled 2021-01-12 (×4): qty 2

## 2021-01-12 MED ORDER — VITAMIN B-12 1000 MCG PO TABS
1000.0000 ug | ORAL_TABLET | Freq: Every day | ORAL | Status: DC
  Administered 2021-01-13 – 2021-01-15 (×3): 1000 ug via ORAL
  Filled 2021-01-12 (×4): qty 1

## 2021-01-12 MED ORDER — ENOXAPARIN SODIUM 30 MG/0.3ML ~~LOC~~ SOLN
30.0000 mg | SUBCUTANEOUS | Status: DC
  Administered 2021-01-12 – 2021-01-14 (×3): 30 mg via SUBCUTANEOUS
  Filled 2021-01-12 (×3): qty 0.3

## 2021-01-12 MED ORDER — ALFUZOSIN HCL ER 10 MG PO TB24
10.0000 mg | ORAL_TABLET | Freq: Every day | ORAL | Status: DC
  Administered 2021-01-13 – 2021-01-22 (×8): 10 mg via ORAL
  Filled 2021-01-12 (×10): qty 1

## 2021-01-12 MED ORDER — LACTATED RINGERS IV BOLUS
1000.0000 mL | Freq: Once | INTRAVENOUS | Status: AC
  Administered 2021-01-12: 1000 mL via INTRAVENOUS

## 2021-01-12 MED ORDER — FERROUS SULFATE 325 (65 FE) MG PO TABS
325.0000 mg | ORAL_TABLET | Freq: Two times a day (BID) | ORAL | Status: DC
  Administered 2021-01-13 – 2021-01-15 (×6): 325 mg via ORAL
  Filled 2021-01-12 (×7): qty 1

## 2021-01-12 MED ORDER — SODIUM CHLORIDE 0.9 % IV BOLUS
1000.0000 mL | Freq: Once | INTRAVENOUS | Status: AC
  Administered 2021-01-12: 1000 mL via INTRAVENOUS

## 2021-01-12 MED ORDER — SODIUM CHLORIDE 0.9 % IV SOLN
500.0000 mg | INTRAVENOUS | Status: DC
  Administered 2021-01-13 (×2): 500 mg via INTRAVENOUS
  Filled 2021-01-12 (×3): qty 500

## 2021-01-12 MED ORDER — ASCORBIC ACID 500 MG PO TABS
250.0000 mg | ORAL_TABLET | Freq: Two times a day (BID) | ORAL | Status: DC
  Administered 2021-01-13 – 2021-01-15 (×7): 250 mg via ORAL
  Filled 2021-01-12 (×8): qty 1

## 2021-01-12 MED ORDER — MELATONIN 5 MG PO TABS
5.0000 mg | ORAL_TABLET | Freq: Every day | ORAL | Status: DC
  Administered 2021-01-13 – 2021-01-16 (×5): 5 mg via ORAL
  Filled 2021-01-12 (×5): qty 1

## 2021-01-12 MED ORDER — SODIUM CHLORIDE 0.9 % IV SOLN: 2.0000 g | INTRAVENOUS | Status: DC

## 2021-01-12 MED ORDER — SENNOSIDES-DOCUSATE SODIUM 8.6-50 MG PO TABS
2.0000 | ORAL_TABLET | Freq: Every day | ORAL | Status: DC
  Administered 2021-01-13 – 2021-01-21 (×7): 2 via ORAL
  Filled 2021-01-12 (×9): qty 2

## 2021-01-12 MED ORDER — STERILE WATER FOR INJECTION IV SOLN
INTRAVENOUS | Status: DC
Start: 2021-01-12 — End: 2021-01-15
  Filled 2021-01-12 (×2): qty 1000
  Filled 2021-01-12: qty 150
  Filled 2021-01-12: qty 1000
  Filled 2021-01-12 (×2): qty 150
  Filled 2021-01-12 (×3): qty 1000
  Filled 2021-01-12: qty 150
  Filled 2021-01-12 (×3): qty 1000

## 2021-01-12 MED ORDER — FUROSEMIDE 10 MG/ML IJ SOLN
40.0000 mg | Freq: Once | INTRAMUSCULAR | Status: AC
  Administered 2021-01-12: 40 mg via INTRAVENOUS
  Filled 2021-01-12: qty 4

## 2021-01-12 MED ORDER — OMEGA-3-ACID ETHYL ESTERS 1 G PO CAPS
1000.0000 mg | ORAL_CAPSULE | Freq: Every day | ORAL | Status: DC
  Administered 2021-01-13 – 2021-01-15 (×2): 1000 mg via ORAL
  Filled 2021-01-12 (×3): qty 1

## 2021-01-12 NOTE — H&P (Addendum)
History and Physical    Lonnie [name]. VFI:[id number] DOB: [birth year] DOA: 01/12/2021  PCP: Derinda Late, MD   Patient coming from: Skilled nursing facility  I have personally briefly reviewed patient's old medical records in [place]  Chief Complaint: Change in mental status  Most of the history was obtained from patient's daughter at the bedside.  HPI: Lonnie [name]. is a 87 y.o. male with medical history significant for acute cholecystitis status post percutaneous cholecystostomy, hypertension, gout, basal cell carcinoma status post skin excision on 12/01/20 who presents to the emergency room via EMS for evaluation of  mental status changes.  Per EMS, the nursing home staff reports that the patient's pulse oximetry was in the high 80s and he was placed on 4 L of oxygen via nasal cannula with improvement in his pulse oximetry.  They also noted a change in his mental status, at baseline patient is usually awake, alert and oriented x 3 but today is only oriented to person and place which is not his baseline.  Oral intake has been very poor. His daughter states that he has had a cough as well as chills. I am unable to do a review of systems on this patient due to his mental status. Labs show sodium 131, potassium 5.9, chloride 105, bicarb 16, glucose 122, BUN 93, creatinine 3.29, calcium 9.5, alkaline phosphatase 100, albumin 3.2, AST 17, ALT 22, total protein 7.8, troponin 13 >> 15,  Patient's point-of-care SARS coronavirus 2 test is pending CT scan of abdomen and pelvis shows Percutaneous cholecystostomy tube in place, without acute complication. Probable constipation and fecal impaction. Worsened bibasilar airspace disease, suspicious for infection or aspiration, superimposed upon chronic scarring. Asbestos related pleural disease. Renal vascular calcification with probable bilateral nephrolithiasis. Coronary artery atherosclerosis. Aortic Atherosclerosis  CT scan of the  head without contrast shows no acute intracranial process. Stable chronic small vessel ischemic changes within the white matter. Chest x-ray reviewed by me shows no acute abnormality. Stable bilateral calcified pleural plaques, compatible with previous asbestos exposure. Twelve-lead EKG reviewed by me shows sinus rhythm, left bundle branch block with a prolonged PR interval.    ED Course: Patient is a 87 year old Caucasian male who was brought to the emergency room from the skilled nursing facility where he resides for evaluation of mental status changes and hypoxia.  Patient is noted to have room air pulse oximetry between 84 to 86% and CT scan of the chest without contrast shows worsening bibasilar airspace disease.  He also has pyuria as well as worsening renal function when compared to his baseline. His baseline serum creatinine is 1.35 and today on admission it is 3.29.  Potassium is also elevated at 5.9. Patient received 2 L IV fluid bolus in the ER, 1 dose of Lasix as well as Lokelma. He will be admitted to the hospital for further evaluation.    Review of Systems: As per HPI otherwise all other systems reviewed and negative.    Past Medical History:  Diagnosis Date  . Anemia   . Basal cell carcinoma 11/13/2009   Right alar groove  . Basal cell carcinoma 04/28/2010   left preauricular, BCC with focal sclerosis. Exc. 06/04/2010  . Basal cell carcinoma 04/24/2013   right post auricular  . Basal cell carcinoma 10/26/2017   right inferior cheek above mandible, Superficial  . Basal cell carcinoma 03/22/2018   right post. base of neck  . Basal cell carcinoma 05/08/2018   post. neck, spinal,  Superficial. EDC 05/08/2018  . Basal cell carcinoma 07/23/2018   left mid supraclavicular, Superficial and nodular pattern. EDC  . Basal cell carcinoma 07/23/2018   right superior chest parasternal. Superficial and nodular pattern. EDC  . Basal cell carcinoma 05/16/2019   left lateral neck.  Superficial and nodular pattern. EDC  . Basal cell carcinoma 11/07/2019   left lateral forehead. Nodular. EDC  . Basal cell carcinoma 12/23/2019   Left ala nasi. BCC with sclerosis.  . Basal cell carcinoma 11/19/2020   L ear post aspect,excised 12/01/20  . Bundle branch block, left   . CKD (chronic kidney disease) stage 3, GFR 30-59 ml/min (HCC)   . COPD (chronic obstructive pulmonary disease) ([place])   . Coronary artery disease   . Family history of adverse reaction to anesthesia    DAUGHTER-N/V  . GERD (gastroesophageal reflux disease)   . History of asbestosis   . HLD (hyperlipidemia)   . Hypertension   . Pneumonia 2017  . PSA elevation     Past Surgical History:  Procedure Laterality Date  . APPENDECTOMY    . COLONOSCOPY WITH ESOPHAGOGASTRODUODENOSCOPY (EGD)    . CYSTOSCOPY WITH INSERTION OF UROLIFT N/A 10/06/2020   Procedure: CYSTOSCOPY WITH INSERTION OF UROLIFT;  Surgeon: Abbie Sons, MD;  Location: [facility] ORS;  Service: Urology;  Laterality: N/A;  . RECTAL SURGERY     x3 ABSCESS     reports that he quit smoking about 31 years ago. His smoking use included cigarettes. He has a 100.00 pack-year smoking history. He has never used smokeless tobacco. He reports current alcohol use. He reports that he does not use drugs.  Allergies  Allergen Reactions  . Sulfa Antibiotics Other (See Comments)    Not sure of reaction. It was too long ago, but he said not to give it to him.   . Sulfasalazine     Other reaction(s): Other (See Comments) Not sure of reaction. It was too long ago, but he said not to give it to him.   Marland Kitchen Penicillin G Rash    Tolerates ampicillin and amoxicillin    Family History  Problem Relation Age of Onset  . Heart attack Mother   . Heart attack Father       Prior to Admission medications   Medication Sig Start Date End Date Taking? Authorizing Provider  alfuzosin (UROXATRAL) 10 MG 24 hr tablet Take 10 mg by mouth daily with breakfast.    [provider]  allopurinol (ZYLOPRIM) 300 MG tablet Take 300 mg by mouth every morning.    [provider]  aspirin 81 MG tablet Take 81 mg by mouth daily.    [provider]  atorvastatin (LIPITOR) 40 MG tablet Take 40 mg by mouth every morning.    [provider]  bisacodyl (DULCOLAX) 5 MG EC tablet Take 10 mg by mouth 2 (two) times daily as needed. 09/18/20   [provider]  budesonide-formoterol (SYMBICORT) 160-4.5 MCG/ACT inhaler Inhale 2 puffs into the lungs 2 (two) times daily.  01/31/18   [provider]  ferrous sulfate 325 (65 FE) MG tablet Take 325 mg by mouth 2 (two) times daily with a meal.     [provider]  finasteride (PROSCAR) 5 MG tablet Take 5 mg by mouth every morning.    [provider]  gabapentin (NEURONTIN) 100 MG capsule Take 1 capsule (100 mg total) by mouth 2 (two) times daily. 12/24/20   Wyvonnia Dusky, MD  Ipratropium-Albuterol (  COMBIVENT RESPIMAT) 20-100 MCG/ACT AERS respimat Inhale 1 puff into the lungs every morning.    [provider]  Magnesium Oxide 250 MG TABS Take 500 mg by mouth every morning.    [provider]  Melatonin 5 MG TABS Take 5 mg by mouth at bedtime.     [provider]  NIFEdipine (PROCARDIA-XL/NIFEDICAL-XL) 30 MG 24 hr tablet Take 60 mg by mouth every morning.    [provider]  nystatin (MYCOSTATIN/NYSTOP) powder Apply topically 3 (three) times daily. 12/14/20   Geradine Girt, DO  Omega-3 1000 MG CAPS Take 1,000 mg by mouth daily.    [provider]  omeprazole (PRILOSEC) 20 MG capsule Take 20 mg by mouth 2 (two) times daily.    [provider]  sennosides-docusate sodium (SENOKOT-S) 8.6-50 MG tablet Take 2 tablets by mouth daily.    [provider]  vitamin B-12 (CYANOCOBALAMIN) 1000 MCG tablet Take 1,000 mcg by mouth daily.    [provider]  vitamin C (ASCORBIC ACID) 250 MG tablet Take 250 mg by mouth  2 (two) times daily.    [provider]    Physical Exam: Vitals:   01/12/21 1500 01/12/21 1630 01/12/21 1802 01/12/21 1807  BP: (!) 104/47 (!) 106/51 (!) 110/57 (!) 110/57  Pulse: 88 88 87 91  Resp: 16 16  16   Temp:      TempSrc:      SpO2: 93% 96% 97% 95%  Weight:      Height:         Vitals:   01/12/21 1500 01/12/21 1630 01/12/21 1802 01/12/21 1807  BP: (!) 104/47 (!) 106/51 (!) 110/57 (!) 110/57  Pulse: 88 88 87 91  Resp: 16 16  16   Temp:      TempSrc:      SpO2: 93% 96% 97% 95%  Weight:      Height:          Constitutional:  Logic but arouses to verbal stimuli. Moaning in pain.  Oriented to person and place.  Chronically ill-appearing HEENT:      Head: Normocephalic and atraumatic.         Eyes: PERLA, EOMI, Conjunctivae are normal. Sclera is non-icteric.       Mouth/Throat: Mucous membranes are dry      Neck: Supple with no signs of meningismus. Cardiovascular: Regular rate and rhythm. No murmurs, gallops, or rubs. 2+ symmetrical distal pulses are present . No JVD. No LE edema Respiratory: Respiratory effort normal .scattered rhonchi bilaterally. No wheezes or crackles Gastrointestinal: Soft, suprapubic tenderness, and non distended with positive bowel sounds.  Genitourinary: No CVA tenderness. Musculoskeletal: Nontender with normal range of motion in all extremities. No cyanosis, or erythema of extremities. Neurologic:  Face is symmetric. Moving all extremities. No gross focal neurologic deficits .  Generalized weakness Skin: Skin is warm, dry.  Decubitus ulcer involving the buttock Psychiatric: Depressed mood and flat affect    Labs on Admission: I have personally reviewed following labs and imaging studies  CBC: Recent Labs  Lab 01/12/21 1448  WBC 14.9*  NEUTROABS 12.5*  HGB 10.7*  HCT 32.8*  MCV 98.8  PLT 916   Basic Metabolic Panel: Recent Labs  Lab 01/12/21 1616  NA 131*  K 5.9*  CL 105  CO2 16*  GLUCOSE 122*  BUN 93*   CREATININE 3.29*  CALCIUM 9.5   GFR: Estimated Creatinine Clearance: 15.2 mL/min (A) (by C-G formula based on SCr of 3.29 mg/dL (  H)). Liver Function Tests: Recent Labs  Lab 01/12/21 1616  AST 17  ALT 22  ALKPHOS 100  BILITOT 0.7  PROT 7.8  ALBUMIN 3.2*   No results for input(s): LIPASE, AMYLASE in the last 168 hours. No results for input(s): AMMONIA in the last 168 hours. Coagulation Profile: No results for input(s): INR, PROTIME in the last 168 hours. Cardiac Enzymes: No results for input(s): CKTOTAL, CKMB, CKMBINDEX, TROPONINI in the last 168 hours. BNP (last 3 results) No results for input(s): PROBNP in the last 8760 hours. HbA1C: No results for input(s): HGBA1C in the last 72 hours. CBG: No results for input(s): GLUCAP in the last 168 hours. Lipid Profile: No results for input(s): CHOL, HDL, LDLCALC, TRIG, CHOLHDL, LDLDIRECT in the last 72 hours. Thyroid Function Tests: No results for input(s): TSH, T4TOTAL, FREET4, T3FREE, THYROIDAB in the last 72 hours. Anemia Panel: No results for input(s): VITAMINB12, FOLATE, FERRITIN, TIBC, IRON, RETICCTPCT in the last 72 hours. Urine analysis:    Component Value Date/Time   COLORURINE YELLOW (A) 01/12/2021 1448   APPEARANCEUR CLOUDY (A) 01/12/2021 1448   APPEARANCEUR Hazy (A) 09/28/2020 1313   LABSPEC 1.015 01/12/2021 1448   LABSPEC 1.017 09/29/2012 2058   PHURINE 5.0 01/12/2021 1448   GLUCOSEU NEGATIVE 01/12/2021 1448   GLUCOSEU Negative 09/29/2012 2058   HGBUR NEGATIVE 01/12/2021 1448   BILIRUBINUR NEGATIVE 01/12/2021 1448   BILIRUBINUR Negative 09/28/2020 1313   BILIRUBINUR Negative 09/29/2012 2058   KETONESUR NEGATIVE 01/12/2021 1448   PROTEINUR 30 (A) 01/12/2021 1448   NITRITE NEGATIVE 01/12/2021 1448   LEUKOCYTESUR LARGE (A) 01/12/2021 1448   LEUKOCYTESUR Negative 09/29/2012 2058    Radiological Exams on Admission: CT ABDOMEN PELVIS WO CONTRAST  Result Date: 01/12/2021 CLINICAL DATA:  Nonlocalized acute  abdominal pain. Percutaneous tube in place for cholecystitis. EXAM: CT ABDOMEN AND PELVIS WITHOUT CONTRAST TECHNIQUE: Multidetector CT imaging of the abdomen and pelvis was performed following the standard protocol without IV contrast. COMPARISON:  Plain film of 12/22/2020.  Most recent CT 12/16/2020. FINDINGS: Lower chest: Worsened right greater than left base airspace disease with mild bibasilar bronchiectasis. Normal heart size with multivessel coronary artery atherosclerosis. Bilateral calcified pleural plaques. Hepatobiliary: Normal noncontrast appearance of the liver. The gallbladder is decompressed by cholecystostomy tube. Tiny gallstone or stones within the neck. No evidence of acute cholecystitis or procedure related complication. No biliary duct dilatation. Pancreas: Normal pancreas for age, without mass or acute inflammation. Spleen: Normal in size, without focal abnormality. Adrenals/Urinary Tract: Normal adrenal glands. Bilateral renal vascular calcification. Suspect concurrent bilateral punctate collecting system calculi. Expected renal cortical thinning for age. No hydroureter or ureteric calculi. No bladder calculi. Stomach/Bowel: Normal stomach, without wall thickening. Rectal stool ball of 7.2 cm. Large colonic stool burden more proximally. Extensive colonic diverticulosis. Normal terminal ileum. Normal small bowel. Vascular/Lymphatic: Aortic atherosclerosis. No abdominopelvic adenopathy. Reproductive: Radiation seeds in the prostate. Other: No significant free fluid. Small bilateral fat containing inguinal hernias. No free intraperitoneal air. Musculoskeletal: No acute osseous abnormality. IMPRESSION: 1. Percutaneous cholecystostomy tube in place, without acute complication. 2. Probable constipation and fecal impaction. 3. Worsened bibasilar airspace disease, suspicious for infection or aspiration, superimposed upon chronic scarring. 4. Asbestos related pleural disease. 5. Renal vascular  calcification with probable bilateral nephrolithiasis. 6. Coronary artery atherosclerosis. 7. Aortic Atherosclerosis (ICD10-I70.0). Electronically Signed   By: Abigail Miyamoto M.D.   On: 01/12/2021 17:51   CT Head Wo Contrast  Result Date: 01/12/2021 CLINICAL DATA:  Altered level of consciousness, lethargy, hypoxia EXAM:  CT HEAD WITHOUT CONTRAST TECHNIQUE: Contiguous axial images were obtained from the base of the skull through the vertex without intravenous contrast. COMPARISON:  01/17/2016 FINDINGS: Brain: Hypodensities within the periventricular white matter are consistent with chronic small vessel ischemic changes, and are stable. No evidence of acute infarct or hemorrhage. The lateral ventricles and midline structures are unremarkable. No acute extra-axial fluid collections. No mass effect. Vascular: No hyperdense vessel or unexpected calcification. Skull: Normal. Negative for fracture or focal lesion. Sinuses/Orbits: No acute finding. Other: None. IMPRESSION: 1. No acute intracranial process. Stable chronic small vessel ischemic changes within the white matter. Electronically Signed   By: Randa Ngo M.D.   On: 01/12/2021 17:45   DG Chest Portable 1 View  Result Date: 01/12/2021 CLINICAL DATA:  Hypoxia. EXAM: PORTABLE CHEST 1 VIEW COMPARISON:  12/16/2020 FINDINGS: Normal sized heart. Tortuous and calcified thoracic aorta. Previously noted bilateral calcified pleural plaques. The underlying lungs remain clear with a poor inspiration noted. Diffuse osteopenia. IMPRESSION: 1. No acute abnormality. 2. Stable bilateral calcified pleural plaques, compatible with previous asbestos exposure. Electronically Signed   By: Claudie Revering M.D.   On: 01/12/2021 15:51     Assessment/Plan Principal Problem:   Acute metabolic encephalopathy Active Problems:   Pneumonia   Acute lower UTI   Acute kidney injury superimposed on CKD (HCC)   Constipation   AMS (altered mental status)   Hyperkalemia    Hyponatremia   Metabolic acidosis, normal anion gap (NAG)   Acute respiratory failure (HCC)      Acute metabolic encephalopathy Appears to be multifactorial and related to acute kidney injury, UTI and pneumonia At baseline patient is usually awake, alert and oriented to person place and time but on admission he is only oriented to person and place. Expect improvement in patient's mental status following resolution of acute illness    Pneumonia with acute respiratory failure Concern for possible aspiration pneumonia Patient noted to be hypoxic with room air pulse oximetry between 84 and 86% that has improved with oxygen supplementation at 2 L. He has a wet sounding cough but is unable to expectorate and imaging shows worsening bibasilar airspace disease Place patient on aspiration precautions Speech therapy consult for swallow function evaluation. We will place patient empirically on cefepime and Zithromax Follow-up results of sputum and blood cultures    Acute on chronic kidney injury with normal anion gap metabolic acidosis/hyperkalemia At baseline patient has a serum creatinine of 1.35 and on admission today his serum creatinine is 3.29. Worsening renal function is associated with normal anion gap metabolic acidosis and hyperkalemia Patient received Lokelma in the emergency room We will start patient on a bicarbonate infusion Obtain bladder scan to assess residual urine volume Repeat renal parameters in a.m. and if worsening will consult nephrology    Urinary tract infection Patient has significant pyuria Prior urine culture yielded Pseudomonas aeruginosa We will treat patient empirically with cefepime until urine culture results become available    Hyponatremia Most likely related to poor oral intake Expect improvement with hydration   COPD Not acutely exacerbated Continue as needed bronchodilator therapy and inhaled steroids    Decubitus ulcer involving the  buttock Unstageable Wound care consult   DVT prophylaxis: Lovenox Code Status: full code Family Communication: Greater than 50% of time was spent discussing patient's condition and plan of care with his daughter Lonnie [name] at the bedside.  All questions and concerns have been addressed.  CODE STATUS was discussed and he is a full code  Disposition Plan: Back to previous home environment Consults called: Speech therapy. Status.  At the time of admission, it appears that the appropriate admission status for this patient is inpatient. This is judged to be reasonable and necessary in order to provide the required intensity of service to ensure the patient's safety given the presenting symptoms, physical exam findings, and an initial radiographic and laboratory data in the context of their comorbid conditions. Patient requires inpatient status due to high intensity of service, high risk of further deterioration and high frequency of surveillance required.    Collier Bullock MD [facility]     01/12/2021, 8:05 PM

## 2021-01-12 NOTE — Consult Note (Signed)
Pharmacy Antibiotic Note  Lonnie [name]. is a 87 y.o. male admitted on 01/12/2021 with acute metabolic encephalopathy likely multifactorial and related to acute kidney injury, UTI and pneumonia. Pharmacy has been consulted for cefepime dosing for UTI.   Plan: Cefepime 1 g IV q24h  Monitor clinical picture and renal function F/U C&S, abx deescalation / LOT   Height: 5\' 8"  (172.7 cm) Weight: 84.8 kg (187 lb) IBW/kg (Calculated) : 68.4  Temp (24hrs), Avg:98.3 F (36.8 C), Min:98.3 F (36.8 C), Max:98.3 F (36.8 C)  Recent Labs  Lab 01/12/21 1448 01/12/21 1616  WBC 14.9*  --   CREATININE  --  3.29*  LATICACIDVEN  --  1.2    Estimated Creatinine Clearance: 15.2 mL/min (A) (by C-G formula based on SCr of 3.29 mg/dL (H)).    Allergies  Allergen Reactions  . Sulfa Antibiotics Other (See Comments)    Not sure of reaction. It was too long ago, but he said not to give it to him.   . Sulfasalazine     Other reaction(s): Other (See Comments) Not sure of reaction. It was too long ago, but he said not to give it to him.   Marland Kitchen Penicillin G Rash    Tolerates ampicillin and amoxicillin    Antimicrobials this admission: 4/12 Cefepime >>  4/12 Azithrom>> 4/12 Ceftriaxone 1g x 1  Dose adjustments this admission:  Microbiology results: 4/12 UCx: Sent 4/12 Sputum: Sent   Thank you for allowing pharmacy to be a part of this patient's care.  Darnelle Bos, PharmD 01/12/2021 8:53 PM

## 2021-01-12 NOTE — ED Triage Notes (Signed)
Pt brought in via EMS for AMS, lethargic.  EMS reports staff states oxygen was in high 80's and they placed on 4L [place].   EMS reports sats of 92RA, CBG 133.  Staff reports that pt has not been eating, and he is AAO x 2 which is not his baseline.  Pt oriented x 2 in triage.

## 2021-01-12 NOTE — ED Provider Notes (Signed)
[facility] Emergency Department Provider Note ____________________________________________   Event Date/Time   First MD Initiated Contact with Patient 01/12/21 1458     (approximate)  I have reviewed the triage vital signs and the nursing notes.  HISTORY  Chief Complaint Altered Mental Status   HPI Lonnie A Lavonne Cass. is a 87 y.o. malewho presents to the ED for evaluation of altered mentation.  Chart review indicates history of HTN, HLD, gout, basal cell carcinoma s/p excision last month. Cholecystitis s/p percutaneous cholecystostomy. Recent admission 3/16-3/24 where he was diagnosed with acute cholecystitis.  Culture from tube demonstrates Enterococcus he was discharged on Augmentin.  Advised to follow-up with general surgery as an outpatient, Dr. Peyton Najjar.  Patient presents from local SNF, [facility], with his daughter for evaluation of 2 days of increasing lethargy, altered mentation.  EMS reported patient was hypoxic with them, though he remains on room air here with [place]   Daughter provides majority of history as patient is disoriented.  He denies complaints, denies pain and reports that he feels okay.  Daughter sees him daily and reports the past 2 days he is not been eating anything, more lethargic, weak in a nonspecific fashion.  She denies seeing any emesis, complaints of abdominal pain, fever, changes to percutaneous cholecystotomy tube output.   Past Medical History:  Diagnosis Date  . Anemia   . Basal cell carcinoma 11/13/2009   Right alar groove  . Basal cell carcinoma 04/28/2010   left preauricular, BCC with focal sclerosis. Exc. 06/04/2010  . Basal cell carcinoma 04/24/2013   right post auricular  . Basal cell carcinoma 10/26/2017   right inferior cheek above mandible, Superficial  . Basal cell carcinoma 03/22/2018   right post. base of neck  . Basal cell carcinoma 05/08/2018   post. neck, spinal, Superficial. EDC 05/08/2018  . Basal cell  carcinoma 07/23/2018   left mid supraclavicular, Superficial and nodular pattern. EDC  . Basal cell carcinoma 07/23/2018   right superior chest parasternal. Superficial and nodular pattern. EDC  . Basal cell carcinoma 05/16/2019   left lateral neck. Superficial and nodular pattern. EDC  . Basal cell carcinoma 11/07/2019   left lateral forehead. Nodular. EDC  . Basal cell carcinoma 12/23/2019   Left ala nasi. BCC with sclerosis.  . Basal cell carcinoma 11/19/2020   L ear post aspect,excised 12/01/20  . Bundle branch block, left   . CKD (chronic kidney disease) stage 3, GFR 30-59 ml/min (HCC)   . COPD (chronic obstructive pulmonary disease) ([place])   . Coronary artery disease   . Family history of adverse reaction to anesthesia    DAUGHTER-N/V  . GERD (gastroesophageal reflux disease)   . History of asbestosis   . HLD (hyperlipidemia)   . Hypertension   . Pneumonia 2017  . PSA elevation     Patient Active Problem List   Diagnosis Date Noted  . AMS (altered mental status) 01/12/2021  . Hyperkalemia 01/12/2021  . Hyponatremia 01/12/2021  . Metabolic acidosis, normal anion gap (NAG) 01/12/2021  . Acute metabolic encephalopathy 62/95/2841  . Acute respiratory failure ([place]) 01/12/2021  . Ileus ([place])   . Constipation   . Abdominal pain   . Non-intractable vomiting   . Leukocytosis   . Wide-complex tachycardia ([place])   . Thrush   . Generalized weakness 12/16/2020  . Acute urinary retention 12/16/2020  . Dehydration 12/16/2020  . Acute kidney injury superimposed on CKD ([place]) 12/16/2020  . Acute cholecystitis 12/09/2020  . Cholecystitis  12/08/2020  . Basal cell carcinoma (BCC) of left ear 12/08/2020  . Hypotonicity of bladder 11/29/2018  . Acute lower UTI 08/11/2018  . UTI (urinary tract infection) 08/11/2018  . Incomplete bladder emptying 08/09/2017  . Benign prostatic hyperplasia 08/09/2017  . Stage 3 chronic kidney disease ([place]) 08/09/2017  . Pneumonia 01/10/2016  .  Nocturia 01/28/2015  . Bundle branch block, left 06/11/2014  . Dizziness 06/11/2014  . Dyspnea 06/11/2014  . History of vertigo 06/11/2014  . Hx of asbestosis 06/11/2014  . Hyperlipidemia 06/11/2014  . Essential hypertension 06/11/2014  . Chronic obstructive pulmonary disease, unspecified ([place]) 03/06/2014  . Elevated prostate specific antigen (PSA) 05/13/2013    Past Surgical History:  Procedure Laterality Date  . APPENDECTOMY    . COLONOSCOPY WITH ESOPHAGOGASTRODUODENOSCOPY (EGD)    . CYSTOSCOPY WITH INSERTION OF UROLIFT N/A 10/06/2020   Procedure: CYSTOSCOPY WITH INSERTION OF UROLIFT;  Surgeon: Abbie Sons, MD;  Location: [facility] ORS;  Service: Urology;  Laterality: N/A;  . RECTAL SURGERY     x3 ABSCESS    Prior to Admission medications   Medication Sig Start Date End Date Taking? Authorizing Provider  alfuzosin (UROXATRAL) 10 MG 24 hr tablet Take 10 mg by mouth daily with breakfast.    [provider]  allopurinol (ZYLOPRIM) 300 MG tablet Take 300 mg by mouth every morning.    [provider]  aspirin 81 MG tablet Take 81 mg by mouth daily.    [provider]  atorvastatin (LIPITOR) 40 MG tablet Take 40 mg by mouth every morning.    [provider]  bisacodyl (DULCOLAX) 5 MG EC tablet Take 10 mg by mouth 2 (two) times daily as needed. 09/18/20   [provider]  budesonide-formoterol (SYMBICORT) 160-4.5 MCG/ACT inhaler Inhale 2 puffs into the lungs 2 (two) times daily.  01/31/18   [provider]  ferrous sulfate 325 (65 FE) MG tablet Take 325 mg by mouth 2 (two) times daily with a meal.     [provider]  finasteride (PROSCAR) 5 MG tablet Take 5 mg by mouth every morning.    [provider]  gabapentin (NEURONTIN) 100 MG capsule Take 1 capsule (100 mg total) by mouth 2 (two) times daily. 12/24/20   Wyvonnia Dusky, MD  Ipratropium-Albuterol (COMBIVENT RESPIMAT) 20-100 MCG/ACT AERS respimat Inhale 1 puff  into the lungs every morning.    [provider]  Magnesium Oxide 250 MG TABS Take 500 mg by mouth every morning.    [provider]  Melatonin 5 MG TABS Take 5 mg by mouth at bedtime.     [provider]  NIFEdipine (PROCARDIA-XL/NIFEDICAL-XL) 30 MG 24 hr tablet Take 60 mg by mouth every morning.    [provider]  nystatin (MYCOSTATIN/NYSTOP) powder Apply topically 3 (three) times daily. 12/14/20   Geradine Girt, DO  Omega-3 1000 MG CAPS Take 1,000 mg by mouth daily.    [provider]  omeprazole (PRILOSEC) 20 MG capsule Take 20 mg by mouth 2 (two) times daily.    [provider]  sennosides-docusate sodium (SENOKOT-S) 8.6-50 MG tablet Take 2 tablets by mouth daily.    [provider]  vitamin B-12 (CYANOCOBALAMIN) 1000 MCG tablet Take 1,000 mcg by mouth daily.    [provider]  vitamin C (ASCORBIC ACID) 250 MG tablet Take 250 mg by mouth 2 (two) times daily.    [provider]    Allergies Sulfa antibiotics, Sulfasalazine, and Penicillin g  Family History  Problem Relation Age of Onset  . Heart attack Mother   . Heart attack Father     Social History Social History   Tobacco Use  . Smoking status: Former Smoker    Packs/day: 2.00    Years: 50.00    Pack years: 100.00    Types: Cigarettes    Quit date: 09/24/1989    Years since quitting: 31.3  . Smokeless tobacco: Never Used  Vaping Use  . Vaping Use: Never used  Substance Use Topics  . Alcohol use: Yes    Alcohol/week: 0.0 standard drinks    Comment: OCC RUM AND COKE  . Drug use: No    Review of Systems  Unable to be accurately assessed due to patient's disorientation ____________________________________________   PHYSICAL EXAM:  VITAL SIGNS: Vitals:   01/12/21 1802 01/12/21 1807  BP: (!) 110/57 (!) 110/57  Pulse: 87 91  Resp:  16  Temp:    SpO2: 97% 95%      Constitutional: Alert and oriented only to self and  location.  Disoriented to year, situation.  No acute distress.  Follows commands in all 4 extremities without apparent deficit. Eyes: Conjunctivae are normal. PERRL. EOMI. Head: Atraumatic. Nose: No congestion/rhinnorhea. Mouth/Throat: Mucous membranes are moist.  Oropharynx non-erythematous. Neck: No stridor. No cervical spine tenderness to palpation. Cardiovascular: Normal rate, regular rhythm. Grossly normal heart sounds.  Good peripheral circulation. Respiratory: Normal respiratory effort.  No retractions. Lungs CTAB. Gastrointestinal: Soft , nondistended, percutaneous tube extending from the RUQ draining dark material without gross blood.  Tenderness to palpation throughout the abdomen, primarily periumbilical and RUQ.  Some voluntary guarding, but no peritoneal features. Musculoskeletal: No lower extremity tenderness nor edema.  No joint effusions. No signs of acute trauma. Neurologic:  Normal speech and language. No gross focal neurologic deficits are appreciated.  Skin:  Skin is warm, dry and intact. No rash noted. Psychiatric: Mood and affect are normal. Speech and behavior are normal.  ____________________________________________   LABS (all labs ordered are listed, but only abnormal results are displayed)  Labs Reviewed  CBC WITH DIFFERENTIAL/PLATELET - Abnormal; Notable for the following components:      Result Value   WBC 14.9 (*)    RBC 3.32 (*)    Hemoglobin 10.7 (*)    HCT 32.8 (*)    Neutro Abs 12.5 (*)    Abs Immature Granulocytes 0.09 (*)    All other components within normal limits  URINALYSIS, COMPLETE (UACMP) WITH MICROSCOPIC - Abnormal; Notable for the following components:   Color, Urine YELLOW (*)    APPearance CLOUDY (*)    Protein, ur 30 (*)    Leukocytes,Ua LARGE (*)    WBC, UA >50 (*)    Bacteria, UA RARE (*)    All other components within normal limits  COMPREHENSIVE METABOLIC PANEL - Abnormal; Notable for the following components:   Sodium 131 (*)     Potassium 5.9 (*)    CO2 16 (*)    Glucose, Bld 122 (*)    BUN 93 (*)    Creatinine, Ser 3.29 (*)    Albumin 3.2 (*)    GFR, Estimated 17 (*)    All other components within normal limits  URINE CULTURE  SARS CORONAVIRUS 2 (TAT 6-24 HRS)  EXPECTORATED SPUTUM ASSESSMENT W GRAM STAIN, RFLX TO RESP C  LACTIC ACID, PLASMA  LACTIC ACID, PLASMA  BASIC METABOLIC PANEL  CBC  HIV ANTIBODY (ROUTINE TESTING W REFLEX)  TROPONIN I (  HIGH SENSITIVITY)  TROPONIN I (HIGH SENSITIVITY)   ____________________________________________  12 Lead EKG  Sinus rhythm, rate of 83 bpm.  Normal axis.  First-degree AV block and left bundle branch block.  No evidence of acute ischemia per [place] Similar to previous from March 2022 ____________________________________________  RADIOLOGY  ED MD interpretation: 1 view CXR reviewed by me with chronic calcified changes without evidence of lobar filtration or acute cardiopulmonary pathology  Official radiology report(s): CT ABDOMEN PELVIS WO CONTRAST  Result Date: 01/12/2021 CLINICAL DATA:  Nonlocalized acute abdominal pain. Percutaneous tube in place for cholecystitis. EXAM: CT ABDOMEN AND PELVIS WITHOUT CONTRAST TECHNIQUE: Multidetector CT imaging of the abdomen and pelvis was performed following the standard protocol without IV contrast. COMPARISON:  Plain film of 12/22/2020.  Most recent CT 12/16/2020. FINDINGS: Lower chest: Worsened right greater than left base airspace disease with mild bibasilar bronchiectasis. Normal heart size with multivessel coronary artery atherosclerosis. Bilateral calcified pleural plaques. Hepatobiliary: Normal noncontrast appearance of the liver. The gallbladder is decompressed by cholecystostomy tube. Tiny gallstone or stones within the neck. No evidence of acute cholecystitis or procedure related complication. No biliary duct dilatation. Pancreas: Normal pancreas for age, without mass or acute inflammation. Spleen: Normal in size,  without focal abnormality. Adrenals/Urinary Tract: Normal adrenal glands. Bilateral renal vascular calcification. Suspect concurrent bilateral punctate collecting system calculi. Expected renal cortical thinning for age. No hydroureter or ureteric calculi. No bladder calculi. Stomach/Bowel: Normal stomach, without wall thickening. Rectal stool ball of 7.2 cm. Large colonic stool burden more proximally. Extensive colonic diverticulosis. Normal terminal ileum. Normal small bowel. Vascular/Lymphatic: Aortic atherosclerosis. No abdominopelvic adenopathy. Reproductive: Radiation seeds in the prostate. Other: No significant free fluid. Small bilateral fat containing inguinal hernias. No free intraperitoneal air. Musculoskeletal: No acute osseous abnormality. IMPRESSION: 1. Percutaneous cholecystostomy tube in place, without acute complication. 2. Probable constipation and fecal impaction. 3. Worsened bibasilar airspace disease, suspicious for infection or aspiration, superimposed upon chronic scarring. 4. Asbestos related pleural disease. 5. Renal vascular calcification with probable bilateral nephrolithiasis. 6. Coronary artery atherosclerosis. 7. Aortic Atherosclerosis (ICD10-I70.0). Electronically Signed   By: Abigail Miyamoto M.D.   On: 01/12/2021 17:51   CT Head Wo Contrast  Result Date: 01/12/2021 CLINICAL DATA:  Altered level of consciousness, lethargy, hypoxia EXAM: CT HEAD WITHOUT CONTRAST TECHNIQUE: Contiguous axial images were obtained from the base of the skull through the vertex without intravenous contrast. COMPARISON:  01/17/2016 FINDINGS: Brain: Hypodensities within the periventricular white matter are consistent with chronic small vessel ischemic changes, and are stable. No evidence of acute infarct or hemorrhage. The lateral ventricles and midline structures are unremarkable. No acute extra-axial fluid collections. No mass effect. Vascular: No hyperdense vessel or unexpected calcification. Skull: Normal.  Negative for fracture or focal lesion. Sinuses/Orbits: No acute finding. Other: None. IMPRESSION: 1. No acute intracranial process. Stable chronic small vessel ischemic changes within the white matter. Electronically Signed   By: Randa Ngo M.D.   On: 01/12/2021 17:45   DG Chest Portable 1 View  Result Date: 01/12/2021 CLINICAL DATA:  Hypoxia. EXAM: PORTABLE CHEST 1 VIEW COMPARISON:  12/16/2020 FINDINGS: Normal sized heart. Tortuous and calcified thoracic aorta. Previously noted bilateral calcified pleural plaques. The underlying lungs remain clear with a poor inspiration noted. Diffuse osteopenia. IMPRESSION: 1. No acute abnormality. 2. Stable bilateral calcified pleural plaques, compatible with previous asbestos exposure. Electronically Signed   By: Claudie Revering M.D.   On: 01/12/2021 15:51    ____________________________________________   PROCEDURES and INTERVENTIONS  Procedure(s) performed (including  Critical Care):  .1-3 Lead EKG Interpretation Performed by: Vladimir Crofts, MD Authorized by: Vladimir Crofts, MD     Interpretation: normal     ECG rate:  80   ECG rate assessment: normal     Rhythm: sinus rhythm     Ectopy: none     Conduction: normal   .Critical Care Performed by: Vladimir Crofts, MD Authorized by: Vladimir Crofts, MD   Critical care provider statement:    Critical care time (minutes):  45   Critical care was necessary to treat or prevent imminent or life-threatening deterioration of the following conditions:  Sepsis and metabolic crisis   Critical care was time spent personally by me on the following activities:  Discussions with consultants, evaluation of patient's response to treatment, examination of patient, ordering and performing treatments and interventions, ordering and review of laboratory studies, ordering and review of radiographic studies, pulse oximetry, re-evaluation of patient's condition, obtaining history from patient or surrogate and review of old  charts    Medications  allopurinol (ZYLOPRIM) tablet 300 mg (has no administration in time range)  aspirin tablet 81 mg (has no administration in time range)  atorvastatin (LIPITOR) tablet 40 mg (has no administration in time range)  Magnesium Oxide TABS 500 mg (has no administration in time range)  pantoprazole (PROTONIX) EC tablet 40 mg (has no administration in time range)  sennosides-docusate sodium (SENOKOT-S) 8.6-50 MG tablet 2 tablet (has no administration in time range)  alfuzosin (UROXATRAL) 24 hr tablet 10 mg (has no administration in time range)  finasteride (PROSCAR) tablet 5 mg (has no administration in time range)  ferrous sulfate tablet 325 mg (has no administration in time range)  vitamin B-12 (CYANOCOBALAMIN) tablet 1,000 mcg (has no administration in time range)  melatonin tablet 5 mg (has no administration in time range)  gabapentin (NEURONTIN) capsule 100 mg (has no administration in time range)  Omega-3 CAPS 1,000 mg (has no administration in time range)  ascorbic acid (VITAMIN C) tablet 250 mg (has no administration in time range)  mometasone-formoterol (DULERA) 200-5 MCG/ACT inhaler 2 puff (has no administration in time range)  nystatin (MYCOSTATIN/NYSTOP) topical powder (has no administration in time range)  enoxaparin (LOVENOX) injection 30 mg (has no administration in time range)  ondansetron (ZOFRAN) tablet 4 mg (has no administration in time range)    Or  ondansetron (ZOFRAN) injection 4 mg (has no administration in time range)  sodium bicarbonate 150 mEq in sterile water 1,150 mL infusion (has no administration in time range)  cefTRIAXone (ROCEPHIN) 2 g in sodium chloride 0.9 % 100 mL IVPB (has no administration in time range)  azithromycin (ZITHROMAX) 500 mg in sodium chloride 0.9 % 250 mL IVPB (has no administration in time range)  sodium chloride 0.9 % bolus 1,000 mL (0 mLs Intravenous Stopped 01/12/21 1807)  cefTRIAXone (ROCEPHIN) 1 g in sodium chloride  0.9 % 100 mL IVPB (0 g Intravenous Stopped 01/12/21 1937)  sodium zirconium cyclosilicate (LOKELMA) packet 10 g (10 g Oral Given 01/12/21 1850)  furosemide (LASIX) injection 40 mg (40 mg Intravenous Given 01/12/21 1824)  lactated ringers bolus 1,000 mL (1,000 mLs Intravenous New Bag/Given 01/12/21 1814)    ____________________________________________   MDM / ED COURSE   87 year old male presents to the ED altered, with evidence of a metabolic encephalopathy from UTI and possible pneumonia, and ultimately requiring medical admission.  Hemodynamically stable.  Required supplemental oxygen with EMS, was on room air for a while here in the ED, and then desaturated  in the ED necessitating 2 L nasal cannula.  Blood work with leukocytosis, chronic anemia and AKI with mild hyperkalemia to 5.9.  EKG with known left bundle block without significant changes consistent with hyperkalemia.  Urine with infectious features concerning for acute cystitis.  CXR demonstrates chronic findings without acute cardiopulmonary pathology, but CT abdomen/pelvis does demonstrate possible lower lobe infiltrates.  No urologic obstruction, SBO on imaging.  Cultures were drawn and patient was provided Rocephin.  Provided 2 L IV fluids, Lokelma, and single dose of IV Lasix after the fluids to help clear potassium.  Remained stable in the ED and we will discuss the case with hospitalist for admission.  Clinical Course as of 01/12/21 2012  Tue Jan 12, 2021  2951 Updated daughter of blood work and urine testing. [DS]    Clinical Course User Index [DS] Vladimir Crofts, MD    ____________________________________________   FINAL CLINICAL IMPRESSION(S) / ED DIAGNOSES  Final diagnoses:  Hyperkalemia  AKI (acute kidney injury) ([place])  Dehydration  Lower urinary tract infectious disease     ED Discharge Orders    None       Lonnie [name]   Note:  This document was prepared using Dragon voice recognition software and may  include unintentional dictation errors.   Vladimir Crofts, MD 01/12/21 2015

## 2021-01-12 NOTE — ED Provider Notes (Signed)
MSE was initiated and I personally evaluated the patient and placed orders (if any) at  2:35 PM on January 12, 2021.  The patient presents with altered mental status, unclear time of onset.  He is alert but confused, maintaining his airway.  The patient appears stable so that the remainder of the MSE may be completed by another provider.   Arta Silence, MD 01/12/21 1436

## 2021-01-13 ENCOUNTER — Encounter: Payer: Self-pay | Admitting: Internal Medicine

## 2021-01-13 ENCOUNTER — Ambulatory Visit: Payer: No Typology Code available for payment source | Admitting: Dermatology

## 2021-01-13 DIAGNOSIS — G9341 Metabolic encephalopathy: Secondary | ICD-10-CM | POA: Diagnosis not present

## 2021-01-13 LAB — CBC
HCT: 25.7 % — ABNORMAL LOW (ref 39.0–52.0)
Hemoglobin: 8.4 g/dL — ABNORMAL LOW (ref 13.0–17.0)
MCH: 32.1 pg (ref 26.0–34.0)
MCHC: 32.7 g/dL (ref 30.0–36.0)
MCV: 98.1 fL (ref 80.0–100.0)
Platelets: 186 10*3/uL (ref 150–400)
RBC: 2.62 MIL/uL — ABNORMAL LOW (ref 4.22–5.81)
RDW: 14.1 % (ref 11.5–15.5)
WBC: 11.3 10*3/uL — ABNORMAL HIGH (ref 4.0–10.5)
nRBC: 0 % (ref 0.0–0.2)

## 2021-01-13 LAB — HIV ANTIBODY (ROUTINE TESTING W REFLEX): HIV Screen 4th Generation wRfx: NONREACTIVE

## 2021-01-13 LAB — BASIC METABOLIC PANEL
Anion gap: 10 (ref 5–15)
BUN: 91 mg/dL — ABNORMAL HIGH (ref 8–23)
CO2: 18 mmol/L — ABNORMAL LOW (ref 22–32)
Calcium: 8.6 mg/dL — ABNORMAL LOW (ref 8.9–10.3)
Chloride: 106 mmol/L (ref 98–111)
Creatinine, Ser: 2.73 mg/dL — ABNORMAL HIGH (ref 0.61–1.24)
GFR, Estimated: 21 mL/min — ABNORMAL LOW (ref >60)
Glucose, Bld: 86 mg/dL (ref 70–99)
Potassium: 4.6 mmol/L (ref 3.5–5.1)
Sodium: 134 mmol/L — ABNORMAL LOW (ref 135–145)

## 2021-01-13 LAB — SARS CORONAVIRUS 2 (TAT 6-24 HRS): SARS Coronavirus 2: NEGATIVE

## 2021-01-13 LAB — SODIUM, URINE, RANDOM: Sodium, Ur: 26 mmol/L

## 2021-01-13 LAB — GLUCOSE, CAPILLARY: Glucose-Capillary: 99 mg/dL (ref 70–99)

## 2021-01-13 LAB — CREATININE, URINE, RANDOM: Creatinine, Urine: 64 mg/dL

## 2021-01-13 MED ORDER — ORAL CARE MOUTH RINSE
15.0000 mL | Freq: Two times a day (BID) | OROMUCOSAL | Status: DC
  Administered 2021-01-13 – 2021-01-22 (×13): 15 mL via OROMUCOSAL

## 2021-01-13 MED ORDER — SODIUM CHLORIDE 0.9 % IV SOLN: INTRAVENOUS | Status: DC

## 2021-01-13 MED ORDER — CHLORHEXIDINE GLUCONATE CLOTH 2 % EX PADS
6.0000 | MEDICATED_PAD | Freq: Every day | CUTANEOUS | Status: DC
  Administered 2021-01-14 – 2021-01-22 (×9): 6 via TOPICAL

## 2021-01-13 MED ORDER — ALLOPURINOL 100 MG PO TABS
100.0000 mg | ORAL_TABLET | ORAL | Status: DC
  Administered 2021-01-14 – 2021-01-15 (×2): 100 mg via ORAL
  Filled 2021-01-13 (×3): qty 1

## 2021-01-13 NOTE — TOC Initial Note (Signed)
Transition of Care (TOC) - Initial/Assessment Note    Patient Details  Name: Lonnie [name]. MRN: [id number] Date of Birth: [birth year]  Transition of Care [facility]) CM/SW Contact:    Candie Chroman, LCSW Phone Number: 01/13/2021, 4:30 PM  Clinical Narrative:   Received call from patient's daughter. She asked that CSW notify the [place] that he was readmitted to the hospital yesterday. Sent secure email to [facility] hospital liaison, Steva Ready to notify. Patient admitted from [facility]. Patient's daughter decided not to pay for a bed hold because she was not sure he would be here at the hospital. Will send referral to other facilities if needed when disposition planning begins. No further concerns. CSW encouraged patient's daughter to contact CSW as needed. CSW will continue to follow patient for support and facilitate discharge once medically stable.             Expected Discharge Plan: Skilled Nursing Facility Barriers to Discharge: Continued Medical Work up   Patient Goals and CMS Choice        Expected Discharge Plan and Services Expected Discharge Plan: [place] Acute Care Choice:  (TBD) Living arrangements for the past 2 months: [place]                                      Prior Living Arrangements/Services Living arrangements for the past 2 months: [place]   Patient language and need for interpreter reviewed:: Yes Do you feel safe going back to the place where you live?: Yes      Need for Family Participation in Patient Care: Yes (Comment) Care giver support system in place?: Yes (comment)   Criminal Activity/Legal Involvement Pertinent to Current Situation/Hospitalization: No - Comment as needed  Activities of Daily Living Home Assistive Devices/Equipment: Dentures (specify type),Eyeglasses,Cane (specify quad or straight) ADL Screening (condition at time of admission) Patient's cognitive ability adequate  to safely complete daily activities?: No Is the patient deaf or have difficulty hearing?: Yes Does the patient have difficulty seeing, even when wearing glasses/contacts?: No Does the patient have difficulty concentrating, remembering, or making decisions?: No Patient able to express need for assistance with ADLs?: Yes Does the patient have difficulty dressing or bathing?: Yes Independently performs ADLs?: No Does the patient have difficulty walking or climbing stairs?: Yes Weakness of Legs: Both Weakness of Arms/Hands: Both  Permission Sought/Granted Permission sought to share information with : Family Supports    Share Information with NAME: [name]     Permission granted to share info w Relationship: Daughter  Permission granted to share info w Contact Information: [phone]  Emotional Assessment Appearance:: Appears stated age     Orientation: : Oriented to Self,Oriented to Place,Oriented to Situation Alcohol / Substance Use: Not Applicable Psych Involvement: No (comment)  Admission diagnosis:  Dehydration [E86.0] Hyperkalemia [E87.5] Lower urinary tract infectious disease [N39.0] AKI (acute kidney injury) ([place]) [N17.9] AMS (altered mental status) [R41.82] Patient Active Problem List   Diagnosis Date Noted  . AMS (altered mental status) 01/12/2021  . Hyperkalemia 01/12/2021  . Hyponatremia 01/12/2021  . Metabolic acidosis, normal anion gap (NAG) 01/12/2021  . Acute metabolic encephalopathy 26/33/3545  . Acute respiratory failure ([place]) 01/12/2021  . Ileus ([place])   . Constipation   . Abdominal pain   . Non-intractable vomiting   . Leukocytosis   . Wide-complex tachycardia (  [place])   . Thrush   . Generalized weakness 12/16/2020  . Acute urinary retention 12/16/2020  . Dehydration 12/16/2020  . Acute kidney injury superimposed on CKD ([place]) 12/16/2020  . Acute cholecystitis 12/09/2020  . Cholecystitis 12/08/2020  . Basal cell carcinoma (BCC) of left ear  12/08/2020  . Hypotonicity of bladder 11/29/2018  . Acute lower UTI 08/11/2018  . UTI (urinary tract infection) 08/11/2018  . Incomplete bladder emptying 08/09/2017  . Benign prostatic hyperplasia 08/09/2017  . Stage 3 chronic kidney disease ([place]) 08/09/2017  . Pneumonia 01/10/2016  . Nocturia 01/28/2015  . Bundle branch block, left 06/11/2014  . Dizziness 06/11/2014  . Dyspnea 06/11/2014  . History of vertigo 06/11/2014  . Hx of asbestosis 06/11/2014  . Hyperlipidemia 06/11/2014  . Essential hypertension 06/11/2014  . Chronic obstructive pulmonary disease, unspecified ([place]) 03/06/2014  . Elevated prostate specific antigen (PSA) 05/13/2013   PCP:  Derinda Late, MD Pharmacy:   [place], [place], [place] [address] [place] [address] Phone: [phone] Fax: [place], [place] - [address] [place] [address] Phone: [phone] Fax: [place], [place] [address] Phone: [phone] Fax: [phone]     Social Determinants of Health (SDOH) Interventions    Readmission Risk Interventions Readmission Risk Prevention Plan 12/18/2020  Transportation Screening Complete  PCP or Specialist Appt within 3-5 Days Complete  HRI or [place] Complete  Social Work Consult for [place] Planning/Counseling Complete  Palliative Care Screening Not Applicable  Medication Review (RN Care Manager) Complete  Some recent data might be hidden

## 2021-01-13 NOTE — Progress Notes (Addendum)
PROGRESS NOTE   Lonnie [name].  UVO:[id number] DOB: 1927-02-06 DOA: 01/12/2021 PCP: Derinda Late, MD  Brief Narrative:  46 Caucasian male [place] resident morning acute cholecystitis status post [facility] (follows with Dr. Peyton Najjar general surgery)-cultures yielded Enterococcus VCM cholecystectomy HTN Gout Basal cell CA status post excision 12/01/2020 Recent hospitalization recent hospitalization 316-12/24/2020 for prolonged ileus NG tube placed-also had wide-complex tachycardia on admission AKI superimposed on CKD (had difficulty voiding requiring Foley catheterization) nephrology saw the patient at the time  Brought into [facility] ED toxic metabolic encephalopathy-O2 sat 80%-reports anorexia-oriented only x2 in the ED-H/O from daughter-work-up revealed leukocytosis hyperkalemia 5.9 urine?  Cystitis-CT abdomen pelvis?  Lower lobe infiltrates on lung Rx Rocephin, IVF, Lokelma, Lasix in ED   Problem list Toxic metabolic encephalopathy?  Cystitis Hyperkalemia/metabolic acidosis on admission superimposed on CKD 3 ?  PNA?  Aspiration-hypoxic respiratory failure on admission Prior wide-complex tachycardia data Hospital-Problem based course  Toxic metabolic encephalopathy on admission?  Secondary to cystitis/aspiration pneumonia Continue azithromycin cefepime Third hospitalization for patient in several months-I have asked palliative care to comment and consolidate goals of care Aspiration with hypoxic respiratory failure on admission SLP confirms risk for aspiration downgraded to dysphagia 2 diet Perc cholecystotomy followed by general surgeon Dr. Peyton Najjar Supposed to have drain study versus further therapeutics 4/20 I will CC Dr. Pascal Lux of IR so that he is aware At this time AKI superimposed on CKD 3 Obtain FeNa Get renal ultrasound in am if creatinine is no better Bladder scan shows obstruction-Foley placed Saline as above-see below Continue your Trexall 10 mg daily, Proscar 5 every  morning Hyperkalemia/metabolic acidosis secondary to sepsis probably from pneumonia Lokelma given x1 with good effect Repeat labs daily Continue bicarb 125 cc an hour Basal cell CA Gout-adjust meds according to renal function per pharmacy Neuropathy-continue gabapentin cautiously given renal function   DVT prophylaxis: Lovenox Code Status: Full CODE STATUS confirmed at bedside Family Communication: Long discussions with 2 daughters at the bedside about repeat hospitalization, need for goals of care discussions and further disposition planning dependent on his progress-I am cautiously optimistic he will improve however given recurrent pattern of hospitalizations he has adult failure to thrive Disposition:  Status is: Inpatient  Remains inpatient appropriate because:Hemodynamically unstable, Ongoing diagnostic testing needed not appropriate for outpatient work up, Unsafe d/c plan and IV treatments appropriate due to intensity of illness or inability to take PO   Dispo: The patient is from: SNF              Anticipated d/c is to: SNF              Patient currently is not medically stable to d/c.   Difficult to place patient No    Consultants:   Not currently  Procedures: multiple  Antimicrobials: As above   Subjective: Patient coherent alert no distress Seems somewhat deconditioned  Objective: Vitals:   01/13/21 0501 01/13/21 0754 01/13/21 1208 01/13/21 1547  BP: (!) 115/56 (!) 119/55 121/64 (!) 136/53  Pulse: 91 91 92 99  Resp: 18 20 17 20   Temp: 97.9 F (36.6 C) 98.5 F (36.9 C) 98.4 F (36.9 C) 97.8 F (36.6 C)  TempSrc:  Oral    SpO2: 97% 96% 100% 99%  Weight: 74.9 kg     Height: 5\' 8"  (1.727 m)       Intake/Output Summary (Last 24 hours) at 01/13/2021 1652 Last data filed at 01/13/2021 1629 Gross per 24 hour  Intake 1999.18 ml  Output  2190 ml  Net -190.82 ml   Filed Weights   01/12/21 1442 01/13/21 0501  Weight: 84.8 kg 74.9 kg     Examination: Coherent awake alert no distress EOMI NCAT no focal deficit Moving all 4 limbs equally Quite weak needs more than 1 assist to sit straight up in the bed Abdomen soft nontender no rebound bowel sounds heard No lower extremity edema Moving all 4 limbs equally S1-S2 no murmur no rub no gallop Sinus rhythm with PVCs heart rates predominantly in the 90s   Data Reviewed: personally reviewed   Sodium 131-->134 Potassium 5.9-->4.6 CO2 16-->18 White count 14.9-->11.3 BUNs/creatinine 23/3.2-->91/2.7 Hemoglobin 8.4 (baseline 9-10) BUNs/creatinine Covid test negative EKG personally reviewed left bundle branch block-rate is improved  Radiology Studies: CT ABDOMEN PELVIS WO CONTRAST  Result Date: 01/12/2021 CLINICAL DATA:  Nonlocalized acute abdominal pain. Percutaneous tube in place for cholecystitis. EXAM: CT ABDOMEN AND PELVIS WITHOUT CONTRAST TECHNIQUE: Multidetector CT imaging of the abdomen and pelvis was performed following the standard protocol without IV contrast. COMPARISON:  Plain film of 12/22/2020.  Most recent CT 12/16/2020. FINDINGS: Lower chest: Worsened right greater than left base airspace disease with mild bibasilar bronchiectasis. Normal heart size with multivessel coronary artery atherosclerosis. Bilateral calcified pleural plaques. Hepatobiliary: Normal noncontrast appearance of the liver. The gallbladder is decompressed by cholecystostomy tube. Tiny gallstone or stones within the neck. No evidence of acute cholecystitis or procedure related complication. No biliary duct dilatation. Pancreas: Normal pancreas for age, without mass or acute inflammation. Spleen: Normal in size, without focal abnormality. Adrenals/Urinary Tract: Normal adrenal glands. Bilateral renal vascular calcification. Suspect concurrent bilateral punctate collecting system calculi. Expected renal cortical thinning for age. No hydroureter or ureteric calculi. No bladder calculi. Stomach/Bowel:  Normal stomach, without wall thickening. Rectal stool ball of 7.2 cm. Large colonic stool burden more proximally. Extensive colonic diverticulosis. Normal terminal ileum. Normal small bowel. Vascular/Lymphatic: Aortic atherosclerosis. No abdominopelvic adenopathy. Reproductive: Radiation seeds in the prostate. Other: No significant free fluid. Small bilateral fat containing inguinal hernias. No free intraperitoneal air. Musculoskeletal: No acute osseous abnormality. IMPRESSION: 1. Percutaneous cholecystostomy tube in place, without acute complication. 2. Probable constipation and fecal impaction. 3. Worsened bibasilar airspace disease, suspicious for infection or aspiration, superimposed upon chronic scarring. 4. Asbestos related pleural disease. 5. Renal vascular calcification with probable bilateral nephrolithiasis. 6. Coronary artery atherosclerosis. 7. Aortic Atherosclerosis (ICD10-I70.0). Electronically Signed   By: Abigail Miyamoto M.D.   On: 01/12/2021 17:51   CT Head Wo Contrast  Result Date: 01/12/2021 CLINICAL DATA:  Altered level of consciousness, lethargy, hypoxia EXAM: CT HEAD WITHOUT CONTRAST TECHNIQUE: Contiguous axial images were obtained from the base of the skull through the vertex without intravenous contrast. COMPARISON:  01/17/2016 FINDINGS: Brain: Hypodensities within the periventricular white matter are consistent with chronic small vessel ischemic changes, and are stable. No evidence of acute infarct or hemorrhage. The lateral ventricles and midline structures are unremarkable. No acute extra-axial fluid collections. No mass effect. Vascular: No hyperdense vessel or unexpected calcification. Skull: Normal. Negative for fracture or focal lesion. Sinuses/Orbits: No acute finding. Other: None. IMPRESSION: 1. No acute intracranial process. Stable chronic small vessel ischemic changes within the white matter. Electronically Signed   By: Randa Ngo M.D.   On: 01/12/2021 17:45   DG Chest  Portable 1 View  Result Date: 01/12/2021 CLINICAL DATA:  Hypoxia. EXAM: PORTABLE CHEST 1 VIEW COMPARISON:  12/16/2020 FINDINGS: Normal sized heart. Tortuous and calcified thoracic aorta. Previously noted bilateral calcified pleural plaques. The underlying lungs  remain clear with a poor inspiration noted. Diffuse osteopenia. IMPRESSION: 1. No acute abnormality. 2. Stable bilateral calcified pleural plaques, compatible with previous asbestos exposure. Electronically Signed   By: Claudie Revering M.D.   On: 01/12/2021 15:51     Scheduled Meds: . alfuzosin  10 mg Oral Q breakfast  . [START ON 01/14/2021] allopurinol  100 mg Oral BH-q7a  . vitamin C  250 mg Oral BID  . aspirin EC  81 mg Oral Daily  . atorvastatin  40 mg Oral BH-q7a  . enoxaparin (LOVENOX) injection  30 mg Subcutaneous Q24H  . ferrous sulfate  325 mg Oral BID WC  . finasteride  5 mg Oral BH-q7a  . gabapentin  100 mg Oral BID  . magnesium oxide  400 mg Oral BH-q7a  . mouth rinse  15 mL Mouth Rinse BID  . melatonin  5 mg Oral QHS  . mometasone-formoterol  2 puff Inhalation BID  . nystatin   Topical TID  . omega-3 acid ethyl esters  1,000 mg Oral Daily  . pantoprazole  40 mg Oral Daily  . senna-docusate  2 tablet Oral Daily  . vitamin B-12  1,000 mcg Oral Daily   Continuous Infusions: . azithromycin Stopped (01/13/21 0130)  . ceFEPime (MAXIPIME) IV Stopped (01/13/21 0030)  .  sodium bicarbonate (isotonic) infusion in sterile water 125 mL/hr at 01/13/21 0842     LOS: 1 day   Time spent: [place], MD [facility] To contact the attending provider between 7A-7P or the covering provider during after hours 7P-7A, please log into the web site [email or web address] and access using universal [place] password for that web site. If you do not have the password, please call the hospital operator.  01/13/2021, 4:52 PM

## 2021-01-13 NOTE — Consult Note (Signed)
[place] Nurse Consult Note: Reason for Consult: Consult  Requested for buttocks.  Pt is frequently incontinent of stool and it is difficult to keep the affected area from becoming soiled.    Wound type: Stage 2 pressure injury to left buttock Pressure Injury POA: Yes Measurement: .8X.8X.1cm Wound bed: pink and dry Drainage (amount, consistency, odor) no odor or drainage Periwound: intact skin surrounding Dressing procedure/placement/frequency: Topical treatment orders provided for bedside nurses to perform as follows to protect from further injury: Foam dressing to buttocks, change Q 3 days or PRN soiling. Please re-consult if further assistance is needed.  Thank-you,  Julien Girt MSN, [place], [place], [place], [place]

## 2021-01-13 NOTE — Progress Notes (Addendum)
[facility]) Hospital Liaison note:  This is a pending outpatient-based Palliative Care patient. Will continue to follow for disposition.  Please call with any outpatient palliative questions or concerns.  Thank you, Lorelee Market, LPN [facility] Liaison [phone]

## 2021-01-13 NOTE — Progress Notes (Signed)
Clinical/Bedside Swallow Evaluation Lonnie [name] Details  Name: Lonnie [name]. MRN: [id number] Date of Birth: [birth year]  Today's Date: 01/13/2021 Time: SLP Start Time (ACUTE ONLY): 0845 SLP Stop Time (ACUTE ONLY): 0915 SLP Time Calculation (min) (ACUTE ONLY): 30 min  Past Medical History:  Past Medical History:  Diagnosis Date   Anemia    Basal cell carcinoma 11/13/2009   Right alar groove   Basal cell carcinoma 04/28/2010   left preauricular, BCC with focal sclerosis. Exc. 06/04/2010   Basal cell carcinoma 04/24/2013   right post auricular   Basal cell carcinoma 10/26/2017   right inferior cheek above mandible, Superficial   Basal cell carcinoma 03/22/2018   right post. base of neck   Basal cell carcinoma 05/08/2018   post. neck, spinal, Superficial. EDC 05/08/2018   Basal cell carcinoma 07/23/2018   left mid supraclavicular, Superficial and nodular pattern. EDC   Basal cell carcinoma 07/23/2018   right superior chest parasternal. Superficial and nodular pattern. EDC   Basal cell carcinoma 05/16/2019   left lateral neck. Superficial and nodular pattern. EDC   Basal cell carcinoma 11/07/2019   left lateral forehead. Nodular. EDC   Basal cell carcinoma 12/23/2019   Left ala nasi. BCC with sclerosis.   Basal cell carcinoma 11/19/2020   L ear post aspect,excised 12/01/20   Bundle branch block, left    CKD (chronic kidney disease) stage 3, GFR 30-59 ml/min (HCC)    COPD (chronic obstructive pulmonary disease) (HCC)    Coronary artery disease    Family history of adverse reaction to anesthesia    DAUGHTER-N/V   GERD (gastroesophageal reflux disease)    History of asbestosis    HLD (hyperlipidemia)    Hypertension    Pneumonia 2017   PSA elevation    Past Surgical History:  Past Surgical History:  Procedure Laterality Date   APPENDECTOMY     COLONOSCOPY WITH ESOPHAGOGASTRODUODENOSCOPY (EGD)     CYSTOSCOPY WITH INSERTION OF UROLIFT N/A 10/06/2020   Procedure: CYSTOSCOPY WITH  INSERTION OF UROLIFT;  Surgeon: Abbie Sons, MD;  Location: [facility] ORS;  Service: Urology;  Laterality: N/A;   RECTAL SURGERY     x3 ABSCESS   HPI:  Lonnie [name] is a 87 year old male who was brought into the ER by EMS for evaluation of weakness and abdominal pain.  Lonnie [name] was recently discharged from hospital following treatment for acute cholecystitis.  He is status post cholecystostomy tube placement and culture yielded Enterococcus faecium.  Lonnie [name] was discharged home on Augmentin.  Labs show worsening leukocytosis with a left shift and normal lactic acid level.  Imaging shows gas /fluid-filled distended stomach. Admitted to the hospital for further evaluation. CT abdomen/pelvis showed worsened right greater than left base airspace disease with mild bibasilar bronchiectasis. PMH: anemia, L BBB, CKD stage 3, COPD, CAD, GERD, hx of asbestosis, HLD, HTN, pneumonia, PSA elevation.   Assessment / Plan / Recommendation Clinical Impression  Lonnie [name] presents with mild oral and suspected primary esophageal dysphagia. When SLP entered, RN giving meds with liquid. Pt did not have overt signs of aspiration, however had difficulty with oral transit of pills with liquid. Delayed coughing, belching are suggestive of esophageal vs oropharyngeal dysphagia in this Lonnie [name] with history of GERD. Pt with improved oral transit of pills in puree, but appeared increasingly uncomfortable, and occasionally needed several bites and followed by liquid wash. Belching, delayed coughing, and eventually gagging/heaving noted ~5 minutes afterwards. Pt refused most other consistencies; did accept 1 bite of cracker, which he  masticated and followed with liquid. After about 30 seconds, pt regurgitated a small amount of translucent yellow-tinged fluid and expectorated on command. Pt did not communicate much verbally, appearing uncomfortable, however he did nod and shake his head appropriately to questions. He reports more difficulty with  solids and that this problem has been going on for some time. He was agreeable to downgrading solids for ease of mastication and esophageal clearance. Recommend dysphagia 2 with thin liquids, meds crushed in puree, with esophageal precautions. Pt should remain upright during and for at least 30 minutes after POs. Pt at moderate risk for aspiration primarily due to potential for post-prandial aspiration with suspected reflux. GI consult may be beneficial. Will follow for tolerance, possible advancement of solids. SLP Visit Diagnosis: Dysphagia, unspecified (R13.10)    Aspiration Risk  Moderate aspiration risk    Diet Recommendation Dysphagia 2 (Fine chop);Thin liquid   Liquid Administration via: Cup Medication Administration: Crushed with puree Supervision: Intermittent supervision to cue for compensatory strategies (position upright prior to meals) Compensations: Slow rate;Small sips/bites;Follow solids with liquid Postural Changes: Seated upright at 90 degrees;Remain upright for at least 30 minutes after po intake    Other  Recommendations Recommended Consults: Consider GI evaluation Oral Care Recommendations: Oral care BID   Follow up Recommendations Other (comment) (tbd)      Frequency and Duration min 2x/week  2 weeks       Prognosis Prognosis for Safe Diet Advancement: Fair      Swallow Study   General Date of Onset: 01/12/21 HPI: Lonnie [name] is a 87 year old male who was brought into the ER by EMS for evaluation of weakness and abdominal pain.  Lonnie [name] was recently discharged from hospital following treatment for acute cholecystitis.  He is status post cholecystostomy tube placement and culture yielded Enterococcus faecium.  Lonnie [name] was discharged home on Augmentin.  Labs show worsening leukocytosis with a left shift and normal lactic acid level.  Imaging shows gas /fluid-filled distended stomach. Admitted to the hospital for further evaluation. CT abdomen/pelvis showed worsened  right greater than left base airspace disease with mild bibasilar bronchiectasis. PMH: anemia, L BBB, CKD stage 3, COPD, CAD, GERD, hx of asbestosis, HLD, HTN, pneumonia, PSA elevation. Type of Study: Bedside Swallow Evaluation Previous Swallow Assessment: none on file Diet Prior to this Study: Regular;Thin liquids Temperature Spikes Noted: No Respiratory Status: Room air History of Recent Intubation: No Behavior/Cognition: Alert;Other (Comment) (appears uncomfortable) Oral Cavity Assessment: Within Functional Limits Oral Care Completed by SLP: No Oral Cavity - Dentition: Dentures, top;Dentures, bottom Vision: Functional for self-feeding Self-Feeding Abilities: Able to feed self Lonnie [name] Positioning: Upright in bed Baseline Vocal Quality: Other (comment) (communicates by nodding vs speech) Volitional Cough: Congested Volitional Swallow: Able to elicit    Oral/Motor/Sensory Function Overall Oral Motor/Sensory Function: Within functional limits   Ice Chips Ice chips: Not tested   Thin Liquid Thin Liquid: Impaired Presentation: Cup;Straw;Self Fed Pharyngeal  Phase Impairments: Cough - Delayed (belching, heaving)    Nectar Thick Nectar Thick Liquid: Not tested   Honey Thick Honey Thick Liquid: Not tested   Puree Puree: Impaired Presentation: Self Fed;Spoon Pharyngeal Phase Impairments: Cough - Delayed (heaving, belching)   Solid     Deneise Lever, MS, CCC-SLP Speech-Language Pathologist  Solid: Impaired Presentation: Self Fed Pharyngeal Phase Impairments: Cough - Delayed (took only small bite)      Aliene Altes 01/13/2021,9:35 AM

## 2021-01-13 NOTE — Hospital Course (Addendum)
65 Caucasian male [place] resident morning acute cholecystitis status post PERC (follows with Dr. Peyton Najjar general surgery)-cultures yielded Enterococcus VCM cholecystectomy HTN Gout Basal cell CA status post excision 12/01/2020 Recent hospitalization recent hospitalization 316-12/24/2020 for prolonged ileus NG tube placed-also had wide-complex tachycardia on admission AKI superimposed on CKD (had difficulty voiding requiring Foley catheterization) nephrology saw the patient at the time  Brought into [facility] ED toxic metabolic encephalopathy-O2 sat 80%-reports anorexia-oriented only x2 in the ED-H/O from daughter-work-up revealed leukocytosis hyperkalemia 5.9 urine?  Cystitis-CT abdomen pelvis?  Lower lobe infiltrates on lung Rx Rocephin, IVF, Lokelma, Lasix in ED   Patient is apparently being followed by [facility] for palliative care services in the outpatient setting and is also followed at the [facility] Foley was placed with immediate return of 675 cc of urine by nursing 4/13  Sodium 131--> 138 Potassium 5.9-->4.6-->3.9 CO2 16--> 27 BUNs/creatinine 23/3.2-->91/2.7-->60/1.7 White count 14.9-->11.3-->8.9 Hemoglobin 8.4 (baseline 9-10)--7.9  Urine sodium 26, urine creatinine 64, fractional excretion of sodium =0.8% indicating prerenal etiology  Shift Summary: Pt orientedx2-3, more delirious overnight attempting to remove foley repeatedly; b/l mittens placed to prevent tube removal. No c/o pain. Easily reoriented. Placed back on 2L [place] for saturation 88-89% on room air. Adequate UO in foley, no BM this shift. Fall/safety precautions in place, rounding performed, needs/concerns addressed.

## 2021-01-14 ENCOUNTER — Encounter: Payer: Self-pay | Admitting: Internal Medicine

## 2021-01-14 DIAGNOSIS — Z515 Encounter for palliative care: Secondary | ICD-10-CM | POA: Diagnosis not present

## 2021-01-14 DIAGNOSIS — L8990 Pressure ulcer of unspecified site, unspecified stage: Secondary | ICD-10-CM | POA: Insufficient documentation

## 2021-01-14 DIAGNOSIS — Z7189 Other specified counseling: Secondary | ICD-10-CM | POA: Diagnosis not present

## 2021-01-14 DIAGNOSIS — J189 Pneumonia, unspecified organism: Secondary | ICD-10-CM

## 2021-01-14 DIAGNOSIS — G9341 Metabolic encephalopathy: Secondary | ICD-10-CM | POA: Diagnosis not present

## 2021-01-14 LAB — CBC WITH DIFFERENTIAL/PLATELET
Abs Immature Granulocytes: 0.05 10*3/uL (ref 0.00–0.07)
Basophils Absolute: 0 10*3/uL (ref 0.0–0.1)
Basophils Relative: 0 %
Eosinophils Absolute: 0.2 10*3/uL (ref 0.0–0.5)
Eosinophils Relative: 2 %
HCT: 23.3 % — ABNORMAL LOW (ref 39.0–52.0)
Hemoglobin: 7.9 g/dL — ABNORMAL LOW (ref 13.0–17.0)
Immature Granulocytes: 1 %
Lymphocytes Relative: 16 %
Lymphs Abs: 1.4 10*3/uL (ref 0.7–4.0)
MCH: 32.6 pg (ref 26.0–34.0)
MCHC: 33.9 g/dL (ref 30.0–36.0)
MCV: 96.3 fL (ref 80.0–100.0)
Monocytes Absolute: 0.8 10*3/uL (ref 0.1–1.0)
Monocytes Relative: 9 %
Neutro Abs: 6.5 10*3/uL (ref 1.7–7.7)
Neutrophils Relative %: 72 %
Platelets: 162 10*3/uL (ref 150–400)
RBC: 2.42 MIL/uL — ABNORMAL LOW (ref 4.22–5.81)
RDW: 14 % (ref 11.5–15.5)
WBC: 8.9 10*3/uL (ref 4.0–10.5)
nRBC: 0 % (ref 0.0–0.2)

## 2021-01-14 LAB — COMPREHENSIVE METABOLIC PANEL
ALT: 19 U/L (ref 0–44)
AST: 18 U/L (ref 15–41)
Albumin: 2.3 g/dL — ABNORMAL LOW (ref 3.5–5.0)
Alkaline Phosphatase: 69 U/L (ref 38–126)
Anion gap: 10 (ref 5–15)
BUN: 60 mg/dL — ABNORMAL HIGH (ref 8–23)
CO2: 27 mmol/L (ref 22–32)
Calcium: 8.4 mg/dL — ABNORMAL LOW (ref 8.9–10.3)
Chloride: 101 mmol/L (ref 98–111)
Creatinine, Ser: 1.74 mg/dL — ABNORMAL HIGH (ref 0.61–1.24)
GFR, Estimated: 36 mL/min — ABNORMAL LOW (ref >60)
Glucose, Bld: 88 mg/dL (ref 70–99)
Potassium: 3.9 mmol/L (ref 3.5–5.1)
Sodium: 138 mmol/L (ref 135–145)
Total Bilirubin: 0.7 mg/dL (ref 0.3–1.2)
Total Protein: 5.6 g/dL — ABNORMAL LOW (ref 6.5–8.1)

## 2021-01-14 LAB — URINE CULTURE: Culture: >=100000 — AB

## 2021-01-14 MED ORDER — AMOXICILLIN-POT CLAVULANATE 500-125 MG PO TABS
1.0000 | ORAL_TABLET | Freq: Two times a day (BID) | ORAL | Status: DC
  Administered 2021-01-14 (×2): 500 mg via ORAL
  Filled 2021-01-14 (×3): qty 1

## 2021-01-14 NOTE — Progress Notes (Signed)
PROGRESS NOTE   Lonnie [name].  JSH:[id number] DOB: Mar 18, 1927 DOA: 01/12/2021 PCP: Derinda Late, MD  Brief Narrative:  26 Caucasian male [place] resident morning acute cholecystitis status post [facility] (follows with Dr. Peyton Najjar general surgery)-cultures yielded Enterococcus VCM cholecystectomy HTN Gout Basal cell CA status post excision 12/01/2020 Recent hospitalization recent hospitalization 316-12/24/2020 for prolonged ileus NG tube placed-also had wide-complex tachycardia on admission AKI superimposed on CKD (had difficulty voiding requiring Foley catheterization) nephrology saw the patient at the time  Brought into [facility] ED toxic metabolic encephalopathy-O2 sat 80%-reports anorexia-oriented only x2 in the ED-H/O from daughter-work-up revealed leukocytosis hyperkalemia 5.9 urine?  Cystitis-CT abdomen pelvis?  Lower lobe infiltrates on lung Rx Rocephin, IVF, Lokelma, Lasix in ED  Patient is apparently being followed by [facility] for palliative care services in the outpatient setting and is also followed at the [facility] Foley was placed with immediate return of 675 cc of urine by nursing 4/13  Urine sodium 26, urine creatinine 64, fractional excretion of sodium =0.8% indicating prerenal etiology  Hospital-Problem based course  Toxic metabolic encephalopathy on admission?  Secondary to cystitis/aspiration pneumonia  azithromycin cefepime-->augmentin 3rd hospitalization for patient in several months-apprciate PMT expertise Aspiration with hypoxic respiratory failure on admission SLP confirms risk for aspiration downgraded to dysphagia 2 diet cont aspiration precautions Perc cholecystotomy followed by general surgeon Dr. Peyton Najjar Supposed to have drain study versus further therapeutics 4/20 I will CC Dr. Pascal Lux of IR so that he is aware AKI superimposed on CKD 3--2/2 Bladder outlet obstruction FeNa 0.8%  bladdr scanned nd 675 cc Needs indwelling foley--Clamping trials in am Continue  urotraxal 10 mg daily, Proscar 5 every morning Hyperkalemia/metabolic acidosis secondary to sepsis probably from pneumonia Lokelma given x1 with good effect Repeat labs daily Continue bicarb 125 cc an hour Basal cell CA Gout-adjust meds according to renal function per pharmacy Neuropathy-continue gabapentin cautiously given renal function   DVT prophylaxis: Lovenox Code Status: Full Family Communication: d/w daughter Loletha Carrow briefly Disposition:  Status is: Inpatient  Remains inpatient appropriate because:Hemodynamically unstable, Ongoing diagnostic testing needed not appropriate for outpatient work up, Unsafe d/c plan and IV treatments appropriate due to intensity of illness or inability to take PO   Dispo: The patient is from: SNF              Anticipated d/c is to: SNF              Patient currently is not medically stable to d/c.   Difficult to place patient No    Consultants:   Not currently  Procedures: multiple  Antimicrobials: As above   Subjective: Quite tired today Visibly weaker  Objective: Vitals:   01/14/21 0017 01/14/21 0528 01/14/21 0757 01/14/21 1206  BP: (P) 124/68 (!) 130/56 (!) 129/49 (!) 135/52  Pulse:  81 79 78  Resp:  18 17 16   Temp: (P) 98.3 F (36.8 C) 98.3 F (36.8 C) 97.8 F (36.6 C) 98.9 F (37.2 C)  TempSrc: (P) Oral   Oral  SpO2:  99% 96% 97%  Weight:      Height:        Intake/Output Summary (Last 24 hours) at 01/14/2021 1459 Last data filed at 01/14/2021 1051 Gross per 24 hour  Intake 120 ml  Output 2360 ml  Net -2240 ml   Filed Weights   01/12/21 1442 01/13/21 0501  Weight: 84.8 kg 74.9 kg    Examination: Sleepy but arouses EOMI NCAT no focal deficit Moving all 4 limbs  equally Quite weak Abdomen soft nontender no rebound bowel sounds heard No lower extremity edema Moving all 4 limbs equally    Data Reviewed: personally reviewed   Sodium 131--> 138 Potassium 5.9-->4.6-->3.9 CO2 16--> 27 BUNs/creatinine  23/3.2-->91/2.7-->60/1.7 White count 14.9-->11.3-->8.9 Hemoglobin 8.4 (baseline 9-10)--7.9   Radiology Studies: CT ABDOMEN PELVIS WO CONTRAST  Result Date: 01/12/2021 CLINICAL DATA:  Nonlocalized acute abdominal pain. Percutaneous tube in place for cholecystitis. EXAM: CT ABDOMEN AND PELVIS WITHOUT CONTRAST TECHNIQUE: Multidetector CT imaging of the abdomen and pelvis was performed following the standard protocol without IV contrast. COMPARISON:  Plain film of 12/22/2020.  Most recent CT 12/16/2020. FINDINGS: Lower chest: Worsened right greater than left base airspace disease with mild bibasilar bronchiectasis. Normal heart size with multivessel coronary artery atherosclerosis. Bilateral calcified pleural plaques. Hepatobiliary: Normal noncontrast appearance of the liver. The gallbladder is decompressed by cholecystostomy tube. Tiny gallstone or stones within the neck. No evidence of acute cholecystitis or procedure related complication. No biliary duct dilatation. Pancreas: Normal pancreas for age, without mass or acute inflammation. Spleen: Normal in size, without focal abnormality. Adrenals/Urinary Tract: Normal adrenal glands. Bilateral renal vascular calcification. Suspect concurrent bilateral punctate collecting system calculi. Expected renal cortical thinning for age. No hydroureter or ureteric calculi. No bladder calculi. Stomach/Bowel: Normal stomach, without wall thickening. Rectal stool ball of 7.2 cm. Large colonic stool burden more proximally. Extensive colonic diverticulosis. Normal terminal ileum. Normal small bowel. Vascular/Lymphatic: Aortic atherosclerosis. No abdominopelvic adenopathy. Reproductive: Radiation seeds in the prostate. Other: No significant free fluid. Small bilateral fat containing inguinal hernias. No free intraperitoneal air. Musculoskeletal: No acute osseous abnormality. IMPRESSION: 1. Percutaneous cholecystostomy tube in place, without acute complication. 2. Probable  constipation and fecal impaction. 3. Worsened bibasilar airspace disease, suspicious for infection or aspiration, superimposed upon chronic scarring. 4. Asbestos related pleural disease. 5. Renal vascular calcification with probable bilateral nephrolithiasis. 6. Coronary artery atherosclerosis. 7. Aortic Atherosclerosis (ICD10-I70.0). Electronically Signed   By: Abigail Miyamoto M.D.   On: 01/12/2021 17:51   CT Head Wo Contrast  Result Date: 01/12/2021 CLINICAL DATA:  Altered level of consciousness, lethargy, hypoxia EXAM: CT HEAD WITHOUT CONTRAST TECHNIQUE: Contiguous axial images were obtained from the base of the skull through the vertex without intravenous contrast. COMPARISON:  01/17/2016 FINDINGS: Brain: Hypodensities within the periventricular white matter are consistent with chronic small vessel ischemic changes, and are stable. No evidence of acute infarct or hemorrhage. The lateral ventricles and midline structures are unremarkable. No acute extra-axial fluid collections. No mass effect. Vascular: No hyperdense vessel or unexpected calcification. Skull: Normal. Negative for fracture or focal lesion. Sinuses/Orbits: No acute finding. Other: None. IMPRESSION: 1. No acute intracranial process. Stable chronic small vessel ischemic changes within the white matter. Electronically Signed   By: Randa Ngo M.D.   On: 01/12/2021 17:45   DG Chest Portable 1 View  Result Date: 01/12/2021 CLINICAL DATA:  Hypoxia. EXAM: PORTABLE CHEST 1 VIEW COMPARISON:  12/16/2020 FINDINGS: Normal sized heart. Tortuous and calcified thoracic aorta. Previously noted bilateral calcified pleural plaques. The underlying lungs remain clear with a poor inspiration noted. Diffuse osteopenia. IMPRESSION: 1. No acute abnormality. 2. Stable bilateral calcified pleural plaques, compatible with previous asbestos exposure. Electronically Signed   By: Claudie Revering M.D.   On: 01/12/2021 15:51     Scheduled Meds: . alfuzosin  10 mg Oral Q  breakfast  . allopurinol  100 mg Oral BH-q7a  . amoxicillin-clavulanate  1 tablet Oral BID  . vitamin C  250 mg Oral BID  .  aspirin EC  81 mg Oral Daily  . atorvastatin  40 mg Oral BH-q7a  . Chlorhexidine Gluconate Cloth  6 each Topical Daily  . enoxaparin (LOVENOX) injection  30 mg Subcutaneous Q24H  . ferrous sulfate  325 mg Oral BID WC  . finasteride  5 mg Oral BH-q7a  . gabapentin  100 mg Oral BID  . magnesium oxide  400 mg Oral BH-q7a  . mouth rinse  15 mL Mouth Rinse BID  . melatonin  5 mg Oral QHS  . mometasone-formoterol  2 puff Inhalation BID  . nystatin   Topical TID  . omega-3 acid ethyl esters  1,000 mg Oral Daily  . pantoprazole  40 mg Oral Daily  . senna-docusate  2 tablet Oral Daily  . vitamin B-12  1,000 mcg Oral Daily   Continuous Infusions: .  sodium bicarbonate (isotonic) infusion in sterile water 125 mL/hr at 01/14/21 0514     LOS: 2 days   Time spent: 110  Nita Sells, MD [facility] To contact the attending provider between 7A-7P or the covering provider during after hours 7P-7A, please log into the web site [email or web address] and access using universal [place] password for that web site. If you do not have the password, please call the hospital operator.  01/14/2021, 2:59 PM

## 2021-01-14 NOTE — Consult Note (Signed)
Consultation Note Date: 01/14/2021   Patient Name: Lonnie [name].  DOB: 23-Apr-1927  MRN: [id number]  Age / Sex: 87 y.o., male  PCP: Derinda Late, MD Referring Physician: Nita Sells, MD  Reason for Consultation: Establishing goals of care and Psychosocial/spiritual support  HPI/Patient Profile: 87 y.o. male  with past medical history of multiple basal cell carcinoma removals chronic anemia, bundle-branch block, COPD, CKD, CAD, GERD, HTN/HLD, elevated PSA, most recently admitted for acute cholecystitis with a PERC cholecystostomy, admitted on 01/12/2021 with toxic metabolic encephalopathy secondary to cystitis/aspiration pneumonia, PERC cholecystotomy.   Clinical Assessment and Goals of Care: I have reviewed medical records including EPIC notes, labs and imaging, received report from attending and transition of care team, examined the patient.  Lonnie [name] is lying quietly in bed.  He appears acutely/chronically ill and quite frail.  He will briefly make eye contact, but closes his eyes for most of the conversation.  He is alert and oriented to person and place, but is unable to tell me the month.  I do believe that he is able to make his basic needs known.  There is no family at bedside at this time.   Lonnie [name] and I briefly talked about what brought him to the hospital.  He states, "pneumonia".  We talked briefly about the treatment plan.  We also talked about where he will go when he leaves the hospital.  We also continue our conversation about CODE STATUS.  At this point, Lonnie [name] continues to elect Full scope/Full code.  He tells me that longevity is important to him.  Call to daughter, [name] to discuss diagnosis prognosis, [place], EOL wishes, disposition and options.  She tells me that she will be at the hospital and we plan a face to face meeting. I see [name] and Lonnie [name] at bedside in the  afternoon. Attending Dr. Verlon Au is present initially.  I introduced Palliative Medicine as specialized medical care for people living with serious illness. It focuses on providing relief from the symptoms and stress of a serious illness.   As far as functional and nutritional status, Lonnie [name] shares that [name] has not shown any improvements after STR and does not feel that he will have benefit from continued STR.  Lonnie [name] shares that Lonnie [name] has been sleeping more, loosing weight and becoming more frail over the last few months.  We talked in detail about Lonnie [name] acute and chronic illness, and the treatment plan.  We talked about a few "what if's and maybes".  We discussed current illness and what it means in the larger context of on-going co-morbidities.  Natural disease trajectory and expectations at EOL were discussed.   The difference between aggressive medical intervention and comfort care was considered in light of the patient's goals of care.   Advanced directives, concepts specific to code status, and rehospitalization were considered and discussed.  [name] continues to endorse Full Code and is unable to set limits for life support.  He shares that longevity  is most important for him.  Lonnie [name] tells me that she and her sisters endorse DNR.   Hospice and Palliative Care services outpatient were explained and offered.  We talk about the benefits of "treat the treatable" hospice care whether at home or at LTC.   Questions and concerns were addressed.  The family was encouraged to call with questions or concerns.   Conference with attending, bedside nursing stafff, TOC team related to patient condition, needs and GOC.   HCPOA    NEXT OF KIN -during our visit last month, Lonnie [name] named his daughter, Lonnie [name], as his healthcare surrogate. He has 2 other daughters and a son who is estranged.     SUMMARY OF RECOMMENDATIONS   At this point continue full scope/code Considering home  with hospice versus long-term care with hospice Outpatient palliative consult has been pending.   Code Status/Advance Care Planning:  Full code -we briefly talked about the concept of "treat the treatable the lower natural death".  Lonnie [name] tells me that longevity is more important than quality for him and he wants to live as long as possible under any circumstances.  He is unable to set limits on life support.  Symptom Management:   Per hospitalist, no additional needs at this time.  Palliative Prophylaxis:   Frequent Pain Assessment and Oral Care  Additional Recommendations (Limitations, Scope, Preferences):  Full Scope Treatment  Psycho-social/Spiritual:   Desire for further Chaplaincy support:no  Additional Recommendations: Caregiving  Support/Resources and Education on Hospice  Prognosis:   Unable to determine, guarded at this point.  Discharge Planning: To be determined, based on outcomes/family choice      Primary Diagnoses: Present on Admission: . AMS (altered mental status) . Acute lower UTI . Constipation . Pneumonia . Acute kidney injury superimposed on CKD ([place]) . Hyperkalemia . Hyponatremia . Metabolic acidosis, normal anion gap (NAG) . Acute metabolic encephalopathy . Acute respiratory failure ([place])   I have reviewed the medical record, interviewed the patient and family, and examined the patient. The following aspects are pertinent.  Past Medical History:  Diagnosis Date  . Anemia   . Basal cell carcinoma 11/13/2009   Right alar groove  . Basal cell carcinoma 04/28/2010   left preauricular, BCC with focal sclerosis. Exc. 06/04/2010  . Basal cell carcinoma 04/24/2013   right post auricular  . Basal cell carcinoma 10/26/2017   right inferior cheek above mandible, Superficial  . Basal cell carcinoma 03/22/2018   right post. base of neck  . Basal cell carcinoma 05/08/2018   post. neck, spinal, Superficial. EDC 05/08/2018  . Basal cell  carcinoma 07/23/2018   left mid supraclavicular, Superficial and nodular pattern. EDC  . Basal cell carcinoma 07/23/2018   right superior chest parasternal. Superficial and nodular pattern. EDC  . Basal cell carcinoma 05/16/2019   left lateral neck. Superficial and nodular pattern. EDC  . Basal cell carcinoma 11/07/2019   left lateral forehead. Nodular. EDC  . Basal cell carcinoma 12/23/2019   Left ala nasi. BCC with sclerosis.  . Basal cell carcinoma 11/19/2020   L ear post aspect,excised 12/01/20  . Bundle branch block, left   . CKD (chronic kidney disease) stage 3, GFR 30-59 ml/min (HCC)   . COPD (chronic obstructive pulmonary disease) ([place])   . Coronary artery disease   . Family history of adverse reaction to anesthesia    DAUGHTER-N/V  . GERD (gastroesophageal reflux disease)   . History of asbestosis   . HLD (hyperlipidemia)   .  Hypertension   . Pneumonia 2017  . PSA elevation    Social History   Socioeconomic History  . Marital status: Widowed    Spouse name: Not on file  . Number of children: Not on file  . Years of education: Not on file  . Highest education level: Not on file  Occupational History  . Not on file  Tobacco Use  . Smoking status: Former Smoker    Packs/day: 2.00    Years: 50.00    Pack years: 100.00    Types: Cigarettes    Quit date: 09/24/1989    Years since quitting: 31.3  . Smokeless tobacco: Never Used  Vaping Use  . Vaping Use: Never used  Substance and Sexual Activity  . Alcohol use: Yes    Alcohol/week: 0.0 standard drinks    Comment: OCC RUM AND COKE  . Drug use: No  . Sexual activity: Not Currently  Other Topics Concern  . Not on file  Social History Narrative  . Not on file   Social Determinants of Health   Financial Resource Strain: Not on file  Food Insecurity: Not on file  Transportation Needs: Not on file  Physical Activity: Not on file  Stress: Not on file  Social Connections: Not on file   Family History   Problem Relation Age of Onset  . Heart attack Mother   . Heart attack Father    Scheduled Meds: . alfuzosin  10 mg Oral Q breakfast  . allopurinol  100 mg Oral BH-q7a  . amoxicillin-clavulanate  1 tablet Oral BID  . vitamin C  250 mg Oral BID  . aspirin EC  81 mg Oral Daily  . atorvastatin  40 mg Oral BH-q7a  . Chlorhexidine Gluconate Cloth  6 each Topical Daily  . enoxaparin (LOVENOX) injection  30 mg Subcutaneous Q24H  . ferrous sulfate  325 mg Oral BID WC  . finasteride  5 mg Oral BH-q7a  . gabapentin  100 mg Oral BID  . magnesium oxide  400 mg Oral BH-q7a  . mouth rinse  15 mL Mouth Rinse BID  . melatonin  5 mg Oral QHS  . mometasone-formoterol  2 puff Inhalation BID  . nystatin   Topical TID  . omega-3 acid ethyl esters  1,000 mg Oral Daily  . pantoprazole  40 mg Oral Daily  . senna-docusate  2 tablet Oral Daily  . vitamin B-12  1,000 mcg Oral Daily   Continuous Infusions: .  sodium bicarbonate (isotonic) infusion in sterile water 125 mL/hr at 01/14/21 0514   PRN Meds:.ondansetron **OR** ondansetron (ZOFRAN) IV Medications Prior to Admission:  Prior to Admission medications   Medication Sig Start Date End Date Taking? Authorizing Provider  alfuzosin (UROXATRAL) 10 MG 24 hr tablet Take 10 mg by mouth daily with breakfast.    [provider]  allopurinol (ZYLOPRIM) 300 MG tablet Take 300 mg by mouth every morning.    [provider]  aspirin 81 MG tablet Take 81 mg by mouth daily.    [provider]  bisacodyl (DULCOLAX) 5 MG EC tablet Take 10 mg by mouth 2 (two) times daily as needed. 09/18/20   [provider]  budesonide-formoterol (SYMBICORT) 160-4.5 MCG/ACT inhaler Inhale 2 puffs into the lungs 2 (two) times daily.  01/31/18   [provider]  ferrous sulfate 325 (65 FE) MG tablet Take 325 mg by mouth 2 (two) times daily with a meal.     [provider]  finasteride (  PROSCAR) 5 MG tablet Take 5 mg by mouth every  morning.    [provider]  gabapentin (NEURONTIN) 100 MG capsule Take 1 capsule (100 mg total) by mouth 2 (two) times daily. 12/24/20   Wyvonnia Dusky, MD  Ipratropium-Albuterol (COMBIVENT RESPIMAT) 20-100 MCG/ACT AERS respimat Inhale 1 puff into the lungs every morning.    [provider]  Magnesium Oxide 250 MG TABS Take 500 mg by mouth every morning.    [provider]  Melatonin 5 MG TABS Take 5 mg by mouth at bedtime.     [provider]  NIFEdipine (PROCARDIA-XL/NIFEDICAL-XL) 30 MG 24 hr tablet Take 60 mg by mouth every morning.    [provider]  nystatin (MYCOSTATIN/NYSTOP) powder Apply topically 3 (three) times daily. 12/14/20   Geradine Girt, DO  omeprazole (PRILOSEC) 20 MG capsule Take 20 mg by mouth 2 (two) times daily.    [provider]  sennosides-docusate sodium (SENOKOT-S) 8.6-50 MG tablet Take 2 tablets by mouth daily.    [provider]  vitamin B-12 (CYANOCOBALAMIN) 1000 MCG tablet Take 1,000 mcg by mouth daily.    [provider]  vitamin C (ASCORBIC ACID) 250 MG tablet Take 250 mg by mouth 2 (two) times daily.    [provider]   Allergies  Allergen Reactions  . Sulfa Antibiotics Other (See Comments)    Not sure of reaction. It was too long ago, but he said not to give it to him.   . Sulfasalazine     Other reaction(s): Other (See Comments) Not sure of reaction. It was too long ago, but he said not to give it to him.   Marland Kitchen Penicillin G Rash    Tolerates ampicillin and amoxicillin   Review of Systems  Unable to perform ROS: Acuity of condition    Physical Exam Vitals and nursing note reviewed.  Constitutional:      General: He is not in acute distress.    Appearance: He is ill-appearing.  HENT:     Head: Atraumatic.     Mouth/Throat:     Mouth: Mucous membranes are moist.  Cardiovascular:     Rate and Rhythm: Normal rate.  Pulmonary:     Effort: Pulmonary effort is  normal. No respiratory distress.     Breath sounds: [place]: .myp.  Neurological:     Comments: Oriented to person and place  Psychiatric:     Comments: Calm and cooperative, not fearful     Vital Signs: BP (!) 135/52 (BP Location: Right Arm)   Pulse 78   Temp 98.9 F (37.2 C) (Oral)   Resp 16   Ht 5\' 8"  (1.727 m)   Wt 74.9 kg   SpO2 97%   BMI 25.11 kg/m  Pain Scale: 0-10   Pain Score: 0-No pain   SpO2: SpO2: 97 % O2 Device:SpO2: 97 % O2 Flow Rate: .O2 Flow Rate (L/min): 2 L/min  IO: Intake/output summary:   Intake/Output Summary (Last 24 hours) at 01/14/2021 1320 Last data filed at 01/14/2021 1051 Gross per 24 hour  Intake 120 ml  Output 2360 ml  Net -2240 ml    LBM: Last BM Date: 01/13/21 Baseline Weight: Weight: 84.8 kg Most recent weight: Weight: 74.9 kg     Palliative Assessment/Data:   Flowsheet Rows   Flowsheet Row Most Recent Value  Intake Tab   Referral Department Hospitalist  Unit at Time of Referral Med/Surg Unit  Palliative Care Primary Diagnosis Other (Comment)  Date Notified 01/13/21  Palliative Care Type Return patient Palliative Care  Reason for referral Clarify Goals of Care  Date of Admission 01/12/21  Date first seen by Palliative Care 01/14/21  # of days Palliative referral response time 1 Day(s)  # of days IP prior to Palliative referral 1  Clinical Assessment   Palliative Performance Scale Score 30%  Pain Max last 24 hours Not able to report  Pain Min Last 24 hours Not able to report  Dyspnea Max Last 24 Hours Not able to report  Dyspnea Min Last 24 hours Not able to report  Psychosocial & Spiritual Assessment   Palliative Care Outcomes       Time In: 0930  Time Out: 1040  Time Total: 70 minutes  Greater than 50%  of this time was spent counseling and coordinating care related to the above assessment and plan.  Signed by: Drue Novel, NP   Please contact Palliative Medicine Team phone at [phone] for questions and  concerns.  For individual provider: See Shea Evans

## 2021-01-14 NOTE — Progress Notes (Signed)
  Speech Language Pathology Treatment: Dysphagia  Patient Details Name: Lonnie [name]. MRN: [id number] DOB: 11/27/1926 Today's Date: 01/14/2021 Time: 9326-7124 SLP Time Calculation (min) (ACUTE ONLY): 28 min  Assessment / Plan / Recommendation Clinical Impression  Patient seen for follow-up for dysphagia. Per daughter and RN, pt has not been very alert and has been refusing POs. RN gave meds crushed in magic cup during session after pt positioned fully upright. Pt self-fed thin liquids (water, Ensure) via straw with no overt signs of aspiration or observable discomfort. Mild belching x1 however no gagging noted today. Pt took small bites of magic cup (honey texture) with adequate oral containment, control and transfer, appearance of adequate airway protection. He refused other POs; has not had much appetite. Educated daughter on menu items appropriate for dysphagia 2 that may be easier/more appetizing for pt based on his likes/dislikes. Continue dysphagia 2 with esophageal precautions, upright for all POs and for 30-60 minutes afterwards. SLP to follow up for tolerance/advancement of solids.    HPI HPI: Patient is a 87 year old male who was brought into the ER by EMS for evaluation of weakness and abdominal pain.  Patient was recently discharged from hospital following treatment for acute cholecystitis.  He is status post cholecystostomy tube placement and culture yielded Enterococcus faecium.  Patient was discharged home on Augmentin.  Labs show worsening leukocytosis with a left shift and normal lactic acid level.  Imaging shows gas /fluid-filled distended stomach. Admitted to the hospital for further evaluation. CT abdomen/pelvis showed worsened right greater than left base airspace disease with mild bibasilar bronchiectasis. PMH: anemia, L BBB, CKD stage 3, COPD, CAD, GERD, hx of asbestosis, HLD, HTN, pneumonia, PSA elevation.      SLP Plan  Continue with current plan of care        Recommendations  Diet recommendations: Dysphagia 2 (fine chop);Thin liquid Liquids provided via: Cup;Straw Medication Administration: Crushed with puree Supervision: Patient able to self feed (May need assistance due to fluctuating mentation. Assist with upright positioning prior to meals) Compensations: Slow rate;Small sips/bites;Follow solids with liquid Postural Changes and/or Swallow Maneuvers: Seated upright 90 degrees;Upright 30-60 min after meal                Oral Care Recommendations: Oral care BID Follow up Recommendations: Other (comment) (tbd) SLP Visit Diagnosis: Dysphagia, unspecified (R13.10) Plan: Continue with current plan of care       [place], [place], CCC-SLP Speech-Language Pathologist   Aliene Altes 01/14/2021, 4:51 PM

## 2021-01-15 DIAGNOSIS — Z515 Encounter for palliative care: Secondary | ICD-10-CM | POA: Diagnosis not present

## 2021-01-15 DIAGNOSIS — G9341 Metabolic encephalopathy: Secondary | ICD-10-CM | POA: Diagnosis not present

## 2021-01-15 DIAGNOSIS — Z7189 Other specified counseling: Secondary | ICD-10-CM | POA: Diagnosis not present

## 2021-01-15 DIAGNOSIS — J189 Pneumonia, unspecified organism: Secondary | ICD-10-CM | POA: Diagnosis not present

## 2021-01-15 LAB — CBC WITH DIFFERENTIAL/PLATELET
Abs Immature Granulocytes: 0.07 10*3/uL (ref 0.00–0.07)
Basophils Absolute: 0.1 10*3/uL (ref 0.0–0.1)
Basophils Relative: 1 %
Eosinophils Absolute: 0.2 10*3/uL (ref 0.0–0.5)
Eosinophils Relative: 2 %
HCT: 24.1 % — ABNORMAL LOW (ref 39.0–52.0)
Hemoglobin: 8 g/dL — ABNORMAL LOW (ref 13.0–17.0)
Immature Granulocytes: 1 %
Lymphocytes Relative: 13 %
Lymphs Abs: 1.3 10*3/uL (ref 0.7–4.0)
MCH: 32.7 pg (ref 26.0–34.0)
MCHC: 33.2 g/dL (ref 30.0–36.0)
MCV: 98.4 fL (ref 80.0–100.0)
Monocytes Absolute: 0.9 10*3/uL (ref 0.1–1.0)
Monocytes Relative: 9 %
Neutro Abs: 7.5 10*3/uL (ref 1.7–7.7)
Neutrophils Relative %: 74 %
Platelets: 166 10*3/uL (ref 150–400)
RBC: 2.45 MIL/uL — ABNORMAL LOW (ref 4.22–5.81)
RDW: 13.8 % (ref 11.5–15.5)
WBC: 10.1 10*3/uL (ref 4.0–10.5)
nRBC: 0 % (ref 0.0–0.2)

## 2021-01-15 LAB — COMPREHENSIVE METABOLIC PANEL
ALT: 25 U/L (ref 0–44)
AST: 22 U/L (ref 15–41)
Albumin: 2.3 g/dL — ABNORMAL LOW (ref 3.5–5.0)
Alkaline Phosphatase: 75 U/L (ref 38–126)
Anion gap: 11 (ref 5–15)
BUN: 40 mg/dL — ABNORMAL HIGH (ref 8–23)
CO2: 32 mmol/L (ref 22–32)
Calcium: 8.2 mg/dL — ABNORMAL LOW (ref 8.9–10.3)
Chloride: 96 mmol/L — ABNORMAL LOW (ref 98–111)
Creatinine, Ser: 1.41 mg/dL — ABNORMAL HIGH (ref 0.61–1.24)
GFR, Estimated: 47 mL/min — ABNORMAL LOW (ref >60)
Glucose, Bld: 84 mg/dL (ref 70–99)
Potassium: 3.4 mmol/L — ABNORMAL LOW (ref 3.5–5.1)
Sodium: 139 mmol/L (ref 135–145)
Total Bilirubin: 0.8 mg/dL (ref 0.3–1.2)
Total Protein: 5.7 g/dL — ABNORMAL LOW (ref 6.5–8.1)

## 2021-01-15 MED ORDER — AMOXICILLIN-POT CLAVULANATE 875-125 MG PO TABS
1.0000 | ORAL_TABLET | Freq: Two times a day (BID) | ORAL | Status: DC
  Administered 2021-01-15 – 2021-01-19 (×8): 1 via ORAL
  Filled 2021-01-15 (×9): qty 1

## 2021-01-15 MED ORDER — ENOXAPARIN SODIUM 40 MG/0.4ML ~~LOC~~ SOLN
40.0000 mg | SUBCUTANEOUS | Status: DC
  Administered 2021-01-15 – 2021-01-21 (×6): 40 mg via SUBCUTANEOUS
  Filled 2021-01-15 (×6): qty 0.4

## 2021-01-15 NOTE — Progress Notes (Signed)
Palliative: Lonnie [name], Lonnie [name], is lying quietly in bed.  He appears to be more alert today, making and mostly keeping eye contact.  He is oriented to person and place, I do not ask time.  He is able to make his basic needs known, but there is no family at bedside at this time.  I asked Lonnie [name] if he has given any further thought to where he wants to go when he discharges from the hospital.  I asked if he would rather [facility] or return to his own home with around-the-clock private pay care and hospice.  At this point Lonnie [name] continues to abdicate, stating that he is not sure.  At one point he states, "I guess I will go home and die".  I shared that I do not expect for him to die within the next few weeks.  I shared that regardless of where he goes, we will make sure that he is cared for.  He is agreeable for me to call his daughter/HC POA, Lonnie [name].     Call to daughter/HC POA, Lonnie [name].  Lonnie [name] shares that she has brought a copy of Lonnie [name] healthcare power of attorney naming her deceased mother then Lonnie [name] as he has healthcare surrogate.  I encouraged her to give the copy to the unit secretary to be placed on Lonnie [name] chart.  Lonnie [name] states that her father has called her several times today stating that he is dying.  Lonnie [name] shares that she is still undecided about disposition.  I shared that since she is not ready for discharging home with around-the-clock care, the best disposition would be long-term care in the [place] facility with hospice care.  Lonnie [name] agrees.  She states her first choice would be [facility], but understands that this is all based on bed availability.  We briefly talked about discharge.  Conference with attending, bedside nursing staff, transition of care team related to patient condition, needs, goals of care, disposition.  Plan:   At this point continue to treat the treatable.  Outpatient palliative/hospice  care to continue CODE STATUS discussions (daughters endorse DNR).  Long-term care at [facility] facility with hospice care.  18 minutes  Quinn Axe, NP Palliative medicine team Team phone [phone] Greater than 50% of this time was spent counseling and coordinating care related to the above assessment and plan.

## 2021-01-15 NOTE — TOC Progression Note (Signed)
Transition of Care (TOC) - Progression Note    Patient Details  Name: [name]. MRN: [id number] Date of Birth: 08/20/1927  Transition of Care [facility]) CM/SW Contact  Shelbie Hutching, RN Phone Number: 01/15/2021, 1:42 PM  Clinical Narrative:    Patient and daughter have both talked with palliative care.  Patient decision is for hospice.  Patient is unable to care for himself at home so they would like to do nursing facility, preferably [facility] with the [place].  RNCM will reach out to [place] to see if we need to get authorization again.  Patient was approved the last time he was hospitalized.     Expected Discharge Plan: [place] Barriers to Discharge: Continued Medical Work up  Expected Discharge Plan and Services Expected Discharge Plan: [place] Acute Care Choice:  (TBD) Living arrangements for the past 2 months: [place]                                       Social Determinants of Health (SDOH) Interventions    Readmission Risk Interventions Readmission Risk Prevention Plan 12/18/2020  Transportation Screening Complete  PCP or Specialist Appt within 3-5 Days Complete  HRI or [place] Complete  Social Work Consult for [place] Planning/Counseling Complete  Palliative Care Screening Not Applicable  Medication Review Press photographer) Complete  Some recent data might be hidden

## 2021-01-15 NOTE — Progress Notes (Signed)
Spoke with patient's daughter Seth Bake and Florentina Jenny (HCDM/POA) about patient's code status since palliative and hospitalist did not clarify. Patient made several comments about not living through the night to daughter and wanted to clarify patient's wishes should an acute change occur. Confirmed with Vikki that patient expressed desire to be DNR and that family is in agreement, informed daughter I would page covering MD to change code status per wishes.

## 2021-01-15 NOTE — Plan of Care (Signed)
Shift Summary: No acute events overnight. Pt Aox2-3, largely wanting to sleep overnight. Remains on 2L [place], no c/o pain. Sodium Bicarb infusing per MAR, new PIV placed d/t infiltration. Adequate UO in foley, x2 BM this shift. Biliary drain functioning w/o issue, dressing intact. Fall/safety precautions in place, rounding performed, needs/concerns addressed.  Problem: Education: Goal: Knowledge of General Education information will improve Description: Including pain rating scale, medication(s)/side effects and non-pharmacologic comfort measures Outcome: Progressing   Problem: Health Behavior/Discharge Planning: Goal: Ability to manage health-related needs will improve Outcome: Progressing   Problem: Clinical Measurements: Goal: Ability to maintain clinical measurements within normal limits will improve Outcome: Progressing Goal: Will remain free from infection Outcome: Progressing Goal: Diagnostic test results will improve Outcome: Progressing Goal: Respiratory complications will improve Outcome: Progressing Goal: Cardiovascular complication will be avoided Outcome: Progressing   Problem: Activity: Goal: Risk for activity intolerance will decrease Outcome: Progressing   Problem: Nutrition: Goal: Adequate nutrition will be maintained Outcome: Progressing   Problem: Coping: Goal: Level of anxiety will decrease Outcome: Progressing   Problem: Elimination: Goal: Will not experience complications related to bowel motility Outcome: Progressing Goal: Will not experience complications related to urinary retention Outcome: Progressing   Problem: Pain Managment: Goal: General experience of comfort will improve Outcome: Progressing   Problem: Safety: Goal: Ability to remain free from injury will improve Outcome: Progressing   Problem: Skin Integrity: Goal: Risk for impaired skin integrity will decrease Outcome: Progressing

## 2021-01-15 NOTE — Progress Notes (Signed)
PROGRESS NOTE   Lonnie [name].  ZOX:[id number] DOB: Mar 17, 1927 DOA: 01/12/2021 PCP: Derinda Late, MD  Brief Narrative:  32 Caucasian male [place] resident morning acute cholecystitis status post [facility] (follows with Dr. Peyton Najjar general surgery)-cultures yielded Enterococcus VCM cholecystectomy HTN Gout Basal cell CA status post excision 12/01/2020 Recent hospitalization recent hospitalization 316-12/24/2020 for prolonged ileus NG tube placed-also had wide-complex tachycardia on admission AKI superimposed on CKD (had difficulty voiding requiring Foley catheterization) nephrology saw the patient at the time  Brought into [facility] toxic metabolic encephalopathy-O2 sat 80%-reports anorexia-oriented only x2 in the ED-H/O from daughter-work-up revealed leukocytosis hyperkalemia 5.9 urine?  Cystitis-CT abdomen pelvis?  Lower lobe infiltrates on lung Rx Rocephin, IVF, Lokelma, Lasix in ED  Foley eventually done 4/13 fractional excretion of sodium =0.8% indicating prerenal etiology-but had post-obst symtp and >600 cc in bladder  Patient has made slow but steady improvements in mentation-palliative care was consulted and patient requests full code scope of care but family seems to understand the gravity of his illnesses and our concerns for recurrent aspiration  Hospital-Problem based course  Toxic metabolic encephalopathy on admission?  Secondary to cystitis/aspiration pneumonia  azithromycin cefepime-->augmentin-stop date 4/18 3rd hospitalization for patient in several months Family realistic re: GOC--patient still fll code and will continue OP [place] discussions Aspiration with hypoxic respiratory failure on admission SLP confirms risk for aspiration downgraded to dysphagia 2 diet cont aspiration precautions Perc cholecystotomy followed by general surgeon Dr. Peyton Najjar Supposed to have drain study versus further therapeutics 4/20 Interventional radiology will be CCed on discharge AKI  superimposed on CKD 3--2/2 Bladder outlet obstruction FeNa 0.8%  bladdr scanned nd 675 cc Needs indwelling foley--Clamping trials Continue urotraxal 10 mg daily, Proscar 5 every morning AKI is resolved pretty reasonably so we will stop sodium bicarb 4/15 Hyperkalemia/metabolic acidosis secondary to sepsis probably from pneumonia Lokelma given x1 with good effect Repeat labs daily Recheck labs Basal cell CA Gout-adjust meds according to renal function per pharmacy Neuropathy-continue gabapentin cautiously given renal function   DVT prophylaxis: Lovenox Code Status: Full Family Communication: d no family present at this time Disposition:  Status is: Inpatient  Remains inpatient appropriate because:Hemodynamically unstable, Ongoing diagnostic testing needed not appropriate for outpatient work up, Unsafe d/c plan and IV treatments appropriate due to intensity of illness or inability to take PO   Dispo: The patient is from: SNF              Anticipated d/c is to: SNF              Patient currently is not medically stable to d/c.   Difficult to place patient No    Consultants:   Not currently  Procedures: multiple  Antimicrobials: As above   Subjective:  More awake alert less listless Soft-spoken and seems to be somewhat depressed Tells me that "I am ready to die" Listened attentively--clarified that the only thing we are trying to do is ensure we have a plan in case he has further decline-I did share with him that I think he is slowly improving from this bout of pneumonia but I shared my concerns that it would recur   Objective: Vitals:   01/14/21 2357 01/15/21 0406 01/15/21 0735 01/15/21 1140  BP: (!) 135/52 (!) 140/53 (!) 145/53 (!) 135/54  Pulse: 79 74 79 70  Resp: 20 16 16 16   Temp: 97.9 F (36.6 C) 98.8 F (37.1 C) 98.2 F (36.8 C) 98.5 F (36.9 C)  TempSrc: Oral  Oral   SpO2: 97% 98% 94% 99%  Weight:  75.3 kg    Height:        Intake/Output Summary  (Last 24 hours) at 01/15/2021 1610 Last data filed at 01/15/2021 0423 Gross per 24 hour  Intake 5135.46 ml  Output 1125 ml  Net 4010.46 ml   Filed Weights   01/12/21 1442 01/13/21 0501 01/15/21 0406  Weight: 84.8 kg 74.9 kg 75.3 kg    Examination:  Awake coherent sitting up looking at a picture of his wife S1-S2 no murmur Chest clear Abdomen soft Sacrum not examined    Data Reviewed: personally reviewed   Sodium 131--> 138-->139 Potassium 5.9-->4.6-->3.4 CO2 16-->32 BUNs/creatinine 23/3.2-->91/2.7-->60/1.7-->40/1.4 White count 14.9-->11.3-->10.1 Hemoglobin 8.0(baseline 9-10)--7.9   Radiology Studies: No results found.   Scheduled Meds: . alfuzosin  10 mg Oral Q breakfast  . allopurinol  100 mg Oral BH-q7a  . amoxicillin-clavulanate  1 tablet Oral Q12H  . vitamin C  250 mg Oral BID  . aspirin EC  81 mg Oral Daily  . atorvastatin  40 mg Oral BH-q7a  . Chlorhexidine Gluconate Cloth  6 each Topical Daily  . enoxaparin (LOVENOX) injection  40 mg Subcutaneous Q24H  . ferrous sulfate  325 mg Oral BID WC  . finasteride  5 mg Oral BH-q7a  . gabapentin  100 mg Oral BID  . magnesium oxide  400 mg Oral BH-q7a  . mouth rinse  15 mL Mouth Rinse BID  . melatonin  5 mg Oral QHS  . mometasone-formoterol  2 puff Inhalation BID  . nystatin   Topical TID  . omega-3 acid ethyl esters  1,000 mg Oral Daily  . pantoprazole  40 mg Oral Daily  . senna-docusate  2 tablet Oral Daily  . vitamin B-12  1,000 mcg Oral Daily   Continuous Infusions:    LOS: 3 days   Time spent: [place], MD [facility] To contact the attending provider between 7A-7P or the covering provider during after hours 7P-7A, please log into the web site [email or web address] and access using universal [place] password for that web site. If you do not have the password, please call the hospital operator.  01/15/2021, 4:10 PM

## 2021-01-15 NOTE — NC FL2 (Signed)
[name] LEVEL OF CARE SCREENING TOOL     IDENTIFICATION  Patient Name: Lonnie [name]. Birthdate: 11-30-1926 Sex: male Admission Date (Current Location): 01/12/2021  [place] and [place] Number:  Engineering geologist and Address:  [facility], [address], [place], [place] [address]      Provider Number: [id number]  Attending Physician Name and Address:  Nita Sells, MD  Relative Name and Phone Number:  Verl Blalock (daughter) [phone]    Current Level of Care: Hospital Recommended Level of Care: [name] Prior Approval Number:    Date Approved/Denied:   PASRR Number: [id number]  Discharge Plan: SNF    Current Diagnoses: Patient Active Problem List   Diagnosis Date Noted  . Pressure injury of skin 01/14/2021  . AMS (altered mental status) 01/12/2021  . Hyperkalemia 01/12/2021  . Hyponatremia 01/12/2021  . Metabolic acidosis, normal anion gap (NAG) 01/12/2021  . Acute metabolic encephalopathy 18/29/9371  . Acute respiratory failure ([place]) 01/12/2021  . Ileus ([place])   . Constipation   . Abdominal pain   . Non-intractable vomiting   . Leukocytosis   . Wide-complex tachycardia ([place])   . Thrush   . Generalized weakness 12/16/2020  . Acute urinary retention 12/16/2020  . Dehydration 12/16/2020  . Acute kidney injury superimposed on CKD ([place]) 12/16/2020  . Acute cholecystitis 12/09/2020  . Cholecystitis 12/08/2020  . Basal cell carcinoma (BCC) of left ear 12/08/2020  . Hypotonicity of bladder 11/29/2018  . Acute lower UTI 08/11/2018  . UTI (urinary tract infection) 08/11/2018  . Incomplete bladder emptying 08/09/2017  . Benign prostatic hyperplasia 08/09/2017  . Stage 3 chronic kidney disease ([place]) 08/09/2017  . Pneumonia 01/10/2016  . Nocturia 01/28/2015  . Bundle branch block, left 06/11/2014  . Dizziness 06/11/2014  . Dyspnea 06/11/2014  . History of vertigo 06/11/2014  . Hx of  asbestosis 06/11/2014  . Hyperlipidemia 06/11/2014  . Essential hypertension 06/11/2014  . Chronic obstructive pulmonary disease, unspecified ([place]) 03/06/2014  . Elevated prostate specific antigen (PSA) 05/13/2013    Orientation RESPIRATION BLADDER Height & Weight     Self,Time,Situation,Place  Normal Indwelling catheter Weight: 75.3 kg Height:  5\' 8"  (172.7 cm)  BEHAVIORAL SYMPTOMS/MOOD NEUROLOGICAL BOWEL NUTRITION STATUS      Incontinent Diet (Dysphagia diet 2- 2Gm sodium.)  AMBULATORY STATUS COMMUNICATION OF NEEDS Skin   Extensive Assist Verbally PU Stage and Appropriate Care (RUQ billiary drain.)   PU Stage 2 Dressing: Daily (foam dressing)                   Personal Care Assistance Level of Assistance  Bathing,Feeding,Dressing Bathing Assistance: Maximum assistance Feeding assistance: Limited assistance Dressing Assistance: Maximum assistance     Functional Limitations Info    Sight Info: Adequate Hearing Info: Adequate Speech Info: Adequate    SPECIAL CARE FACTORS FREQUENCY                       Contractures Contractures Info: Not present    Additional Factors Info  Code Status,Allergies Code Status Info: Full Code Allergies Info: Sulfa antibiotics, sulfasalazine, PCN G           Current Medications (01/15/2021):  This is the current hospital active medication list Current Facility-Administered Medications  Medication Dose Route Frequency Provider Last Rate Last Admin  . alfuzosin (UROXATRAL) 24 hr tablet 10 mg  10 mg Oral Q breakfast Agbata, Tochukwu, MD   10 mg at 01/15/21 0900  .  allopurinol (ZYLOPRIM) tablet 100 mg  100 mg Oral Lora Paula, RPH   100 mg at 01/15/21 0901  . amoxicillin-clavulanate (AUGMENTIN) 875-125 MG per tablet 1 tablet  1 tablet Oral Q12H Nita Sells, MD   1 tablet at 01/15/21 0900  . ascorbic acid (VITAMIN C) tablet 250 mg  250 mg Oral BID Agbata, Tochukwu, MD   250 mg at 01/15/21 0900  . aspirin EC  tablet 81 mg  81 mg Oral Daily Agbata, Tochukwu, MD   81 mg at 01/15/21 0901  . atorvastatin (LIPITOR) tablet 40 mg  40 mg Oral Butch Penny, Tochukwu, MD   40 mg at 01/15/21 0901  . Chlorhexidine Gluconate Cloth 2 % PADS 6 each  6 each Topical Daily Nita Sells, MD   6 each at 01/15/21 0902  . enoxaparin (LOVENOX) injection 40 mg  40 mg Subcutaneous Q24H Samtani, Jai-Gurmukh, MD      . ferrous sulfate tablet 325 mg  325 mg Oral BID WC Agbata, Tochukwu, MD   325 mg at 01/15/21 0900  . finasteride (PROSCAR) tablet 5 mg  5 mg Oral Butch Penny, Tochukwu, MD   5 mg at 01/15/21 0901  . gabapentin (NEURONTIN) capsule 100 mg  100 mg Oral BID Agbata, Tochukwu, MD   100 mg at 01/15/21 0901  . magnesium oxide (MAG-OX) tablet 400 mg  400 mg Oral Butch Penny, Tochukwu, MD   400 mg at 01/15/21 0901  . MEDLINE mouth rinse  15 mL Mouth Rinse BID Agbata, Tochukwu, MD   15 mL at 01/15/21 0902  . melatonin tablet 5 mg  5 mg Oral QHS Agbata, Tochukwu, MD   5 mg at 01/14/21 1959  . mometasone-formoterol (DULERA) 200-5 MCG/ACT inhaler 2 puff  2 puff Inhalation BID Agbata, Tochukwu, MD   2 puff at 01/15/21 0900  . nystatin (MYCOSTATIN/NYSTOP) topical powder   Topical TID Collier Bullock, MD   Given at 01/15/21 0900  . omega-3 acid ethyl esters (LOVAZA) capsule 1,000 mg  1,000 mg Oral Daily Agbata, Tochukwu, MD   1,000 mg at 01/15/21 0902  . ondansetron (ZOFRAN) tablet 4 mg  4 mg Oral Q6H PRN Agbata, Tochukwu, MD       Or  . ondansetron (ZOFRAN) injection 4 mg  4 mg Intravenous Q6H PRN Agbata, Tochukwu, MD      . pantoprazole (PROTONIX) EC tablet 40 mg  40 mg Oral Daily Agbata, Tochukwu, MD   40 mg at 01/15/21 0901  . senna-docusate (Senokot-S) tablet 2 tablet  2 tablet Oral Daily Agbata, Tochukwu, MD   2 tablet at 01/15/21 0900  . sodium bicarbonate 150 mEq in sterile water 1,150 mL infusion   Intravenous Continuous Agbata, Tochukwu, MD 125 mL/hr at 01/14/21 2351 New Bag at 01/14/21 2351  . vitamin B-12  (CYANOCOBALAMIN) tablet 1,000 mcg  1,000 mcg Oral Daily Agbata, Tochukwu, MD   1,000 mcg at 01/15/21 0901     Discharge Medications: Please see discharge summary for a list of discharge medications.  Relevant Imaging Results:  Relevant Lab Results:   Additional Information SS#: [id number]. Has a biliary drain to RUQ.  Shelbie Hutching, RN

## 2021-01-16 DIAGNOSIS — G9341 Metabolic encephalopathy: Secondary | ICD-10-CM | POA: Diagnosis not present

## 2021-01-16 LAB — CBC WITH DIFFERENTIAL/PLATELET
Abs Immature Granulocytes: 0.05 10*3/uL (ref 0.00–0.07)
Basophils Absolute: 0.1 10*3/uL (ref 0.0–0.1)
Basophils Relative: 1 %
Eosinophils Absolute: 0.3 10*3/uL (ref 0.0–0.5)
Eosinophils Relative: 3 %
HCT: 26.9 % — ABNORMAL LOW (ref 39.0–52.0)
Hemoglobin: 8.7 g/dL — ABNORMAL LOW (ref 13.0–17.0)
Immature Granulocytes: 1 %
Lymphocytes Relative: 15 %
Lymphs Abs: 1.4 10*3/uL (ref 0.7–4.0)
MCH: 32.6 pg (ref 26.0–34.0)
MCHC: 32.3 g/dL (ref 30.0–36.0)
MCV: 100.7 fL — ABNORMAL HIGH (ref 80.0–100.0)
Monocytes Absolute: 0.7 10*3/uL (ref 0.1–1.0)
Monocytes Relative: 7 %
Neutro Abs: 7.1 10*3/uL (ref 1.7–7.7)
Neutrophils Relative %: 73 %
Platelets: 181 10*3/uL (ref 150–400)
RBC: 2.67 MIL/uL — ABNORMAL LOW (ref 4.22–5.81)
RDW: 13.5 % (ref 11.5–15.5)
WBC: 9.6 10*3/uL (ref 4.0–10.5)
nRBC: 0 % (ref 0.0–0.2)

## 2021-01-16 LAB — COMPREHENSIVE METABOLIC PANEL
ALT: 27 U/L (ref 0–44)
AST: 24 U/L (ref 15–41)
Albumin: 2.5 g/dL — ABNORMAL LOW (ref 3.5–5.0)
Alkaline Phosphatase: 78 U/L (ref 38–126)
Anion gap: 11 (ref 5–15)
BUN: 25 mg/dL — ABNORMAL HIGH (ref 8–23)
CO2: 30 mmol/L (ref 22–32)
Calcium: 8.8 mg/dL — ABNORMAL LOW (ref 8.9–10.3)
Chloride: 99 mmol/L (ref 98–111)
Creatinine, Ser: 1.32 mg/dL — ABNORMAL HIGH (ref 0.61–1.24)
GFR, Estimated: 51 mL/min — ABNORMAL LOW (ref >60)
Glucose, Bld: 69 mg/dL — ABNORMAL LOW (ref 70–99)
Potassium: 3.6 mmol/L (ref 3.5–5.1)
Sodium: 140 mmol/L (ref 135–145)
Total Bilirubin: 1.1 mg/dL (ref 0.3–1.2)
Total Protein: 6.2 g/dL — ABNORMAL LOW (ref 6.5–8.1)

## 2021-01-16 MED ORDER — MIRTAZAPINE 15 MG PO TABS
7.5000 mg | ORAL_TABLET | Freq: Every day | ORAL | Status: DC
  Administered 2021-01-16: 7.5 mg via ORAL
  Filled 2021-01-16 (×2): qty 1

## 2021-01-16 MED ORDER — MIRTAZAPINE 15 MG PO TBDP: 15.0000 mg | ORAL_TABLET | Freq: Every day | ORAL | Status: DC

## 2021-01-16 NOTE — Progress Notes (Signed)
Foley was clamped for 3 hrs.  Patient denied sensation to void. Foley unclamped, 147ml urine output drained.  Dr. Verlon Au has been notified.  No new orders.

## 2021-01-16 NOTE — Progress Notes (Signed)
Patient did not eat lunch. Per patient he does not feel hungry. He had few sips of milk, and few bites of chocolate pudding. He did not aspirate.  Notified Dr. Verlon Au of the above.  No new orders.

## 2021-01-16 NOTE — Progress Notes (Signed)
PROGRESS NOTE   Lonnie [name].  NIO:[id number] DOB: 02-25-1927 DOA: 01/12/2021 PCP: Derinda Late, MD  Brief Narrative:  78 Caucasian male [place] resident morning acute cholecystitis status post [facility] (follows with Dr. Peyton [name] general surgery)-cultures yielded Enterococcus VCM cholecystectomy HTN Gout Basal cell CA status post excision 12/01/2020 Recent hospitalization recent hospitalization 316-12/24/2020 for prolonged ileus NG tube placed-also had wide-complex tachycardia on admission AKI superimposed on CKD (had difficulty voiding requiring Foley catheterization) nephrology saw the patient at the time  Brought into [facility] ED toxic metabolic encephalopathy-O2 sat 80%-reports anorexia-oriented only x2 in the ED-H/O from daughter-work-up revealed leukocytosis hyperkalemia 5.9 urine?  Cystitis-CT abdomen pelvis?  Lower lobe infiltrates on lung Rx Rocephin, IVF, Lokelma, Lasix in ED  Foley eventually done 4/13 fractional excretion of sodium =0.8% indicating prerenal etiology-but had post-obst symtp and >600 cc in bladder  Patient has made slow but steady improvements in mentation-palliative care was consulted and patient requests full code scope of care but family seems to understand the gravity of his illnesses and our concerns for recurrent aspiration  Hospital-Problem based course  Toxic metabolic encephalopathy on admission?  Secondary to cystitis/aspiration pneumonia azithromycin cefepime-->augmentin-stop date 4/18 3rd hospitalization for patient in several months Family realistic re: GOC--now DNR Aspiration with hypoxic respiratory failure on admission SLP confirms risk for aspiration downgraded to dysphagia 2 diet--diet liberalized [he isnt eatign at all] cont aspiration precautions Depression-situational emotionally labile Explained to him/family an antidepressant may help, will not change outcome Have explained to family who are very supportive to spend time as allowed per  their schedule with him and support him Perc cholecystotomy followed by general surgeon Dr. Peyton [name] Supposed to have drain study versus further therapeutics 4/20 Interventional radiology will be CCed on discharge AKI superimposed on CKD 3--2/2 Bladder outlet obstruction FeNa 0.8%  bladdr scanned nd 675 cc Needs indwelling foley--Clamping trials perfomred Continue urotraxal 10 mg daily, Proscar 5 every morning AKI is resolved pretty reasonably so we will stop sodium bicarb 4/15 Hyperkalemia/metabolic acidosis secondary to sepsis probably from pneumonia Lokelma given x1 with good effect Repeat labs daily Recheck labs Basal cell CA Gout-adjust meds according to renal function per pharmacy Neuropathy-continue gabapentin cautiously given renal function   DVT prophylaxis: Lovenox Code Status: Full Family Communication: Extensive discussion with family-1 with daughter at the bedside and the other 1 on the phone explaining plan of care-all opportunities given for them to ask questions-low yield for the patient to receive Remeron or fluoxetine at this time-he is at the end of his life-my preference would be to see how he adjusts and manages with family support on discharge at skilled and then may be transition if he continues to aspirate Disposition:  Status is: Inpatient  Remains inpatient appropriate because:Hemodynamically unstable, Ongoing diagnostic testing needed not appropriate for outpatient work up, Unsafe d/c plan and IV treatments appropriate due to intensity of illness or inability to take PO   Dispo: The patient is from: SNF              Anticipated d/c is to: SNF              Patient currently is not medically stable to d/c.   Difficult to place patient No    Consultants:   Not currently  Procedures: multiple  Antimicrobials: As above   Subjective:  Somewhat sleepy today Soft-spoken Depressed He is not really eating so I liberalized his diet to a regular and  changed him off the dysphagia diet Nurses  aware to contact me if he starts aspirating  Objective: Vitals:   01/15/21 2311 01/16/21 0526 01/16/21 0820 01/16/21 1202  BP: (!) 150/57 (!) 165/69 (!) 154/81 (!) 148/69  Pulse: 76 81 79 73  Resp: 19 16 14 14   Temp: 98.3 F (36.8 C) 98.3 F (36.8 C) 98.6 F (37 C) 98.3 F (36.8 C)  TempSrc: Oral Oral Oral Oral  SpO2: 96% 96% 91% 93%  Weight:      Height:        Intake/Output Summary (Last 24 hours) at 01/16/2021 1426 Last data filed at 01/16/2021 1008 Gross per 24 hour  Intake 10 ml  Output 2450 ml  Net -2440 ml   Filed Weights   01/12/21 1442 01/13/21 0501 01/15/21 0406  Weight: 84.8 kg 74.9 kg 75.3 kg    Examination:  Sleepy but arouses readily S1-S2 no murmur Chest clear   Data Reviewed: personally reviewed   Sodium 140 BUNs/creatinine down to 25/1.3 glucose was 69 this morning Hemoglobin 8.7 White count 9.6     Radiology Studies: No results found.   Scheduled Meds: . alfuzosin  10 mg Oral Q breakfast  . amoxicillin-clavulanate  1 tablet Oral Q12H  . Chlorhexidine Gluconate Cloth  6 each Topical Daily  . enoxaparin (LOVENOX) injection  40 mg Subcutaneous Q24H  . finasteride  5 mg Oral BH-q7a  . gabapentin  100 mg Oral BID  . mouth rinse  15 mL Mouth Rinse BID  . melatonin  5 mg Oral QHS  . mometasone-formoterol  2 puff Inhalation BID  . nystatin   Topical TID  . pantoprazole  40 mg Oral Daily  . senna-docusate  2 tablet Oral Daily   Continuous Infusions:    LOS: 4 days   Time spent: [place], MD [facility] To contact the attending provider between 7A-7P or the covering provider during after hours 7P-7A, please log into the web site [email or web address] and access using universal [place] password for that web site. If you do not have the password, please call the hospital operator.  01/16/2021, 2:26 PM

## 2021-01-16 NOTE — Plan of Care (Signed)
Shift Summary: No acute events overnight. Pt emotional/tearful during assessment, able to rest and more cheerful during the night. Orientedx2-3, remains on 2L [place], VSS. Foley with adequate UO, x1 BM this shift. Refused q2hr turn/repositioning. Fall/safety precautions in place, rounding performed, needs/concerns addressed during shift.   Problem: Education: Goal: Knowledge of General Education information will improve Description: Including pain rating scale, medication(s)/side effects and non-pharmacologic comfort measures Outcome: Progressing   Problem: Health Behavior/Discharge Planning: Goal: Ability to manage health-related needs will improve Outcome: Progressing   Problem: Clinical Measurements: Goal: Ability to maintain clinical measurements within normal limits will improve Outcome: Progressing Goal: Will remain free from infection Outcome: Progressing Goal: Diagnostic test results will improve Outcome: Progressing Goal: Respiratory complications will improve Outcome: Progressing Goal: Cardiovascular complication will be avoided Outcome: Progressing   Problem: Activity: Goal: Risk for activity intolerance will decrease Outcome: Progressing   Problem: Nutrition: Goal: Adequate nutrition will be maintained Outcome: Progressing   Problem: Coping: Goal: Level of anxiety will decrease Outcome: Progressing   Problem: Elimination: Goal: Will not experience complications related to bowel motility Outcome: Progressing Goal: Will not experience complications related to urinary retention Outcome: Progressing   Problem: Pain Managment: Goal: General experience of comfort will improve Outcome: Progressing   Problem: Safety: Goal: Ability to remain free from injury will improve Outcome: Progressing   Problem: Skin Integrity: Goal: Risk for impaired skin integrity will decrease Outcome: Progressing

## 2021-01-17 DIAGNOSIS — G9341 Metabolic encephalopathy: Secondary | ICD-10-CM | POA: Diagnosis not present

## 2021-01-17 LAB — CBC WITH DIFFERENTIAL/PLATELET
Abs Immature Granulocytes: 0.05 10*3/uL (ref 0.00–0.07)
Basophils Absolute: 0 10*3/uL (ref 0.0–0.1)
Basophils Relative: 1 %
Eosinophils Absolute: 0.2 10*3/uL (ref 0.0–0.5)
Eosinophils Relative: 2 %
HCT: 28.6 % — ABNORMAL LOW (ref 39.0–52.0)
Hemoglobin: 9.4 g/dL — ABNORMAL LOW (ref 13.0–17.0)
Immature Granulocytes: 1 %
Lymphocytes Relative: 13 %
Lymphs Abs: 1.2 10*3/uL (ref 0.7–4.0)
MCH: 32.3 pg (ref 26.0–34.0)
MCHC: 32.9 g/dL (ref 30.0–36.0)
MCV: 98.3 fL (ref 80.0–100.0)
Monocytes Absolute: 0.6 10*3/uL (ref 0.1–1.0)
Monocytes Relative: 7 %
Neutro Abs: 6.6 10*3/uL (ref 1.7–7.7)
Neutrophils Relative %: 76 %
Platelets: 196 10*3/uL (ref 150–400)
RBC: 2.91 MIL/uL — ABNORMAL LOW (ref 4.22–5.81)
RDW: 13.4 % (ref 11.5–15.5)
WBC: 8.7 10*3/uL (ref 4.0–10.5)
nRBC: 0 % (ref 0.0–0.2)

## 2021-01-17 LAB — COMPREHENSIVE METABOLIC PANEL
ALT: 26 U/L (ref 0–44)
AST: 24 U/L (ref 15–41)
Albumin: 2.7 g/dL — ABNORMAL LOW (ref 3.5–5.0)
Alkaline Phosphatase: 85 U/L (ref 38–126)
Anion gap: 8 (ref 5–15)
BUN: 30 mg/dL — ABNORMAL HIGH (ref 8–23)
CO2: 28 mmol/L (ref 22–32)
Calcium: 9.2 mg/dL (ref 8.9–10.3)
Chloride: 100 mmol/L (ref 98–111)
Creatinine, Ser: 1.28 mg/dL — ABNORMAL HIGH (ref 0.61–1.24)
GFR, Estimated: 53 mL/min — ABNORMAL LOW (ref >60)
Glucose, Bld: 143 mg/dL — ABNORMAL HIGH (ref 70–99)
Potassium: 4 mmol/L (ref 3.5–5.1)
Sodium: 136 mmol/L (ref 135–145)
Total Bilirubin: 0.6 mg/dL (ref 0.3–1.2)
Total Protein: 6.4 g/dL — ABNORMAL LOW (ref 6.5–8.1)

## 2021-01-17 NOTE — Plan of Care (Signed)
Shift Summary: Pt orientedx2-3, more delirious overnight attempting to remove foley repeatedly; b/l mittens placed to prevent tube removal. No c/o pain. Easily reoriented. Placed back on 2L [place] for saturation 88-89% on room air. Adequate UO in foley, no BM this shift. Fall/safety precautions in place, rounding performed, needs/concerns addressed.  Problem: Education: Goal: Knowledge of General Education information will improve Description: Including pain rating scale, medication(s)/side effects and non-pharmacologic comfort measures Outcome: Progressing   Problem: Health Behavior/Discharge Planning: Goal: Ability to manage health-related needs will improve Outcome: Progressing   Problem: Clinical Measurements: Goal: Ability to maintain clinical measurements within normal limits will improve Outcome: Progressing Goal: Will remain free from infection Outcome: Progressing Goal: Diagnostic test results will improve Outcome: Progressing Goal: Respiratory complications will improve Outcome: Progressing Goal: Cardiovascular complication will be avoided Outcome: Progressing   Problem: Activity: Goal: Risk for activity intolerance will decrease Outcome: Progressing   Problem: Nutrition: Goal: Adequate nutrition will be maintained Outcome: Progressing   Problem: Coping: Goal: Level of anxiety will decrease Outcome: Progressing   Problem: Elimination: Goal: Will not experience complications related to bowel motility Outcome: Progressing Goal: Will not experience complications related to urinary retention Outcome: Progressing   Problem: Pain Managment: Goal: General experience of comfort will improve Outcome: Progressing   Problem: Safety: Goal: Ability to remain free from injury will improve Outcome: Progressing   Problem: Skin Integrity: Goal: Risk for impaired skin integrity will decrease Outcome: Progressing

## 2021-01-17 NOTE — TOC Progression Note (Addendum)
Transition of Care (TOC) - Progression Note    Patient Details  Name: Lonnie [name]. MRN: [id number] Date of Birth: 05-Jan-1927  Transition of Care [facility]) CM/SW [place], LCSW Phone Number: 01/17/2021, 8:43 AM  Clinical Narrative:   CSW reached out to Neoma Laming at [facility] to inquire about status of placement with hospice. Per Neoma Laming, she has not reviewed referral yet. She will review referral Monday and follow up with weekday TOC. She reported Josem Kaufmann will need to be obtained with the [place]. CSW sent encrypted email to [employer] at [facility] and cc'd [place] (weekday [facility]), inquired what needs to be done to obtain [place] auth.    Expected Discharge Plan: [place] Barriers to Discharge: Continued Medical Work up  Expected Discharge Plan and Services Expected Discharge Plan: [place] Acute Care Choice:  (TBD) Living arrangements for the past 2 months: [place]                                       Social Determinants of Health (SDOH) Interventions    Readmission Risk Interventions Readmission Risk Prevention Plan 12/18/2020  Transportation Screening Complete  PCP or Specialist Appt within 3-5 Days Complete  HRI or [place] Complete  Social Work Consult for [place] Planning/Counseling Complete  Palliative Care Screening Not Applicable  Medication Review Press photographer) Complete  Some recent data might be hidden

## 2021-01-17 NOTE — Progress Notes (Signed)
PROGRESS NOTE   Lonnie [name].  ZHG:[id number] DOB: 1926-12-07 DOA: 01/12/2021 PCP: Derinda Late, MD  Brief Narrative:  26 Caucasian male [place] resident morning acute cholecystitis status post [facility] (follows with Dr. Peyton Najjar general surgery)-cultures yielded Enterococcus VCM cholecystectomy HTN Gout Basal cell CA status post excision 12/01/2020 Recent hospitalization recent hospitalization 316-12/24/2020 for prolonged ileus NG tube placed-also had wide-complex tachycardia on admission AKI superimposed on CKD (had difficulty voiding requiring Foley catheterization) nephrology saw the patient at the time  Brought into [facility] ED toxic metabolic encephalopathy-O2 sat 80%-reports anorexia-oriented only x2 in the ED-H/O from daughter-work-up revealed leukocytosis hyperkalemia 5.9 urine?  Cystitis-CT abdomen pelvis?  Lower lobe infiltrates on lung Rx Rocephin, IVF, Lokelma, Lasix in ED  Foley eventually done 4/13 fractional excretion of sodium =0.8% indicating prerenal etiology-but had post-obst symtp and >600 cc in bladder  Patient vacillating hospital course compounded by metabolic encephalopathy probably from medication He is now DNR  Hospital-Problem based course  Toxic metabolic encephalopathy on admission?  Secondary to cystitis/aspiration pneumonia azithromycin cefepime-->augmentin-stop date 4/18 3rd hospitalization for patient in several months Family realistic re: GOC--now DNR Aspiration with hypoxic respiratory failure on admission SLP confirms risk for aspiration downgraded to dysphagia 2 diet--diet liberalized [he isnt eatign at all] cont aspiration precautions-periodic x-ray in 1 to 2 weeks Depression-situational emotionally labile Remeron prescribed unfortunately caused confusion overnight 4/17-Long discussion with family-discontinue any psychotropics at this time  Perc cholecystotomy followed by general surgeon Dr. Peyton Najjar Supposed to have drain study versus further  therapeutics 4/20 Interventional radiology will be CCed on discharge AKI superimposed on CKD 3--2/2 Bladder outlet obstruction FeNa 0.8%  bladdr scanned nd 675 cc Needs indwelling foley--Clamping trials were unsuccessful Continue urotraxal 10 mg daily, Proscar 5 every morning Acidosis-resolved stop sodium bicarb 4/15 Hyperkalemia/metabolic acidosis secondary to sepsis probably from pneumonia Lokelma given x1 with good effect Repeat labs daily Recheck labs Basal cell CA Gout-adjust meds according to renal function per pharmacy Neuropathy-discontinue completely gabapentin 4/17 given encephalopathy   DVT prophylaxis: Lovenox Code Status: Full Family Communication: Extensive discussion with daughter [name] on [phone] Disposition:  Status is: Inpatient  Remains inpatient appropriate because:Hemodynamically unstable, Ongoing diagnostic testing needed not appropriate for outpatient work up, Unsafe d/c plan and IV treatments appropriate due to intensity of illness or inability to take PO   Dispo: The patient is from: SNF              Anticipated d/c is to: SNF              Patient currently is not medically stable to d/c.   Difficult to place patient No    Consultants:   Not currently  Procedures: multiple  Antimicrobials: As above   Subjective:  Overnight events noted-patient taken out of mittens by the me this morning-able to feed himself some but quite depressed appearing and does not seem to want to interact Now on oxygen  Objective: Vitals:   01/16/21 2033 01/16/21 2355 01/17/21 0435 01/17/21 0813  BP: (!) 175/68 (!) 168/58 (!) 156/64 (!) 170/68  Pulse: 63 63 68 61  Resp: 16 17 17 15   Temp: (!) 97.4 F (36.3 C) 98.5 F (36.9 C) (!) 97.4 F (36.3 C) 97.7 F (36.5 C)  TempSrc:   Oral Oral  SpO2: 95% (!) 89% 100%   Weight:      Height:        Intake/Output Summary (Last 24 hours) at 01/17/2021 1138 Last data filed at 01/17/2021 0500 Gross per  24  hour  Intake 368 ml  Output 2025 ml  Net -1657 ml   Filed Weights   01/12/21 1442 01/13/21 0501 01/15/21 0406  Weight: 84.8 kg 74.9 kg 75.3 kg    Examination:  Arousable-confused-cannot tell me date or year can tell me that he is at [facility] CT[facility] B no added sound no rales S1-S2 no murmur Abdomen soft no rebound Neurologically intact grossly to power moving all 4 limbs   Data Reviewed: personally reviewed   Bun/creat 25/1.32-->30/1.28 WBC 8.7   Radiology Studies: No results found.   Scheduled Meds: . alfuzosin  10 mg Oral Q breakfast  . amoxicillin-clavulanate  1 tablet Oral Q12H  . Chlorhexidine Gluconate Cloth  6 each Topical Daily  . enoxaparin (LOVENOX) injection  40 mg Subcutaneous Q24H  . finasteride  5 mg Oral BH-q7a  . mouth rinse  15 mL Mouth Rinse BID  . mometasone-formoterol  2 puff Inhalation BID  . nystatin   Topical TID  . pantoprazole  40 mg Oral Daily  . senna-docusate  2 tablet Oral Daily   Continuous Infusions:    LOS: 5 days   Time spent: [place], MD [facility] To contact the attending provider between 7A-7P or the covering provider during after hours 7P-7A, please log into the web site [email or web address] and access using universal [place] password for that web site. If you do not have the password, please call the hospital operator.  01/17/2021, 11:38 AM

## 2021-01-18 DIAGNOSIS — Z7189 Other specified counseling: Secondary | ICD-10-CM | POA: Diagnosis not present

## 2021-01-18 DIAGNOSIS — Z515 Encounter for palliative care: Secondary | ICD-10-CM

## 2021-01-18 DIAGNOSIS — J189 Pneumonia, unspecified organism: Secondary | ICD-10-CM | POA: Diagnosis not present

## 2021-01-18 DIAGNOSIS — G9341 Metabolic encephalopathy: Secondary | ICD-10-CM | POA: Diagnosis not present

## 2021-01-18 LAB — BASIC METABOLIC PANEL
Anion gap: 11 (ref 5–15)
BUN: 29 mg/dL — ABNORMAL HIGH (ref 8–23)
CO2: 25 mmol/L (ref 22–32)
Calcium: 9.4 mg/dL (ref 8.9–10.3)
Chloride: 103 mmol/L (ref 98–111)
Creatinine, Ser: 1.32 mg/dL — ABNORMAL HIGH (ref 0.61–1.24)
GFR, Estimated: 51 mL/min — ABNORMAL LOW (ref >60)
Glucose, Bld: 91 mg/dL (ref 70–99)
Potassium: 3.7 mmol/L (ref 3.5–5.1)
Sodium: 139 mmol/L (ref 135–145)

## 2021-01-18 LAB — MAGNESIUM: Magnesium: 2.1 mg/dL (ref 1.7–2.4)

## 2021-01-18 LAB — MRSA PCR SCREENING: MRSA by PCR: NEGATIVE

## 2021-01-18 NOTE — Plan of Care (Addendum)
Mag level was 2.1 no mag ordered or given. Pt rested during the overnight. No prn meds needed.  Problem: Education: Goal: Knowledge of General Education information will improve Description: Including pain rating scale, medication(s)/side effects and non-pharmacologic comfort measures Outcome: Progressing   Problem: Health Behavior/Discharge Planning: Goal: Ability to manage health-related needs will improve Outcome: Progressing   Problem: Clinical Measurements: Goal: Ability to maintain clinical measurements within normal limits will improve Outcome: Progressing Goal: Will remain free from infection Outcome: Progressing Goal: Diagnostic test results will improve Outcome: Progressing Goal: Respiratory complications will improve Outcome: Progressing Goal: Cardiovascular complication will be avoided Outcome: Progressing   Problem: Activity: Goal: Risk for activity intolerance will decrease Outcome: Progressing   Problem: Nutrition: Goal: Adequate nutrition will be maintained Outcome: Progressing   Problem: Coping: Goal: Level of anxiety will decrease Outcome: Progressing   Problem: Elimination: Goal: Will not experience complications related to bowel motility Outcome: Progressing   Problem: Pain Managment: Goal: General experience of comfort will improve Outcome: Progressing   Problem: Safety: Goal: Ability to remain free from injury will improve Outcome: Progressing   Problem: Skin Integrity: Goal: Risk for impaired skin integrity will decrease Outcome: Progressing   Problem: Elimination: Goal: Will not experience complications related to urinary retention Outcome: Not Progressing  Pt continues to have foley for retention.

## 2021-01-18 NOTE — TOC Progression Note (Signed)
Transition of Care (TOC) - Progression Note    Patient Details  Name: Sheldon Sem. MRN: [id number] Date of Birth: 1927-01-27  Transition of Care [facility]) CM/SW Contact  Shelbie Hutching, RN Phone Number: 01/18/2021, 2:00 PM  Clinical Narrative:    [facility] has agreed to accept patient but they do have to hear back from the [place] for authorization.  Hilda Blades at [facility] has contacted the [place].     Expected Discharge Plan: Skilled Nursing Facility Barriers to Discharge: Insurance Authorization  Expected Discharge Plan and Services Expected Discharge Plan: [place]     Post Acute Care Choice:  (TBD) Living arrangements for the past 2 months: [place]                                       Social Determinants of Health (SDOH) Interventions    Readmission Risk Interventions Readmission Risk Prevention Plan 12/18/2020  Transportation Screening Complete  PCP or Specialist Appt within 3-5 Days Complete  HRI or [place] Complete  Social Work Consult for [place] Planning/Counseling Complete  Palliative Care Screening Not Applicable  Medication Review Press photographer) Complete  Some recent data might be hidden

## 2021-01-18 NOTE — Progress Notes (Signed)
PROGRESS NOTE   Lonnie [name].  YYT:[id number] DOB: November 29, 1926 DOA: 01/12/2021 PCP: Derinda Late, MD  Brief Narrative:   21 Caucasian male [place] resident morning acute cholecystitis status post [facility] (follows with Dr. Peyton Najjar general surgery)-cultures yielded Enterococcus VCM cholecystectomy HTN Gout Basal cell CA status post excision 12/01/2020 Recent hospitalization recent hospitalization 316-12/24/2020 for prolonged ileus NG tube placed-also had wide-complex tachycardia on admission AKI superimposed on CKD (had difficulty voiding requiring Foley catheterization) nephrology saw the patient at the time  Brought into [facility] ED toxic metabolic encephalopathy-O2 sat 80%-reports anorexia-oriented only x2 in the ED-H/O from daughter-work-up revealed leukocytosis hyperkalemia 5.9 urine?  Cystitis-CT abdomen pelvis?  Lower lobe infiltrates on lung Rx Rocephin, IVF, Lokelma, Lasix in ED  Foley eventually done 4/13 fractional excretion of sodium =0.8% indicating prerenal etiology-but had post-obst symtp and >600 cc in bladder  Patient vacillating hospital course compounded by metabolic encephalopathy probably from medication He is now DNR  Hospital-Problem based course  Toxic metabolic encephalopathy on admission?  Secondary to cystitis/aspiration pneumonia azithromycin cefepime-->augmentin-stop date 4/18 3rd hospitalization for patient in several months Family realistic re: GOC--now DNR Suspect will need to go to [facility] with hospice following Aspiration with hypoxic respiratory failure on admission SLP suspects esophageal dysphagia 2 diet--diet liberalized [poor appetite not eating] cont aspiration precautions-no further diagnostics Depression-situational Metabolic encephalopathy Continues to be slightly confused thinks he is interim, thinks this is 2028 and cannot orient well Stop all psychotropics Will need to discuss this going forward with family Perc cholecystotomy followed  by general surgeon Dr. Peyton Najjar Supposed to have drain study versus further therapeutics 4/20 We will cancel interventional radiology appointments going forward he will keep indwelling cholecystotomy at this time given trajectory of care AKI superimposed on CKD 3--2/2 Bladder outlet obstruction FeNa 0.8%  bladdr scanned and 675 cc Needs indwelling foley--Clamping trials were unsuccessful Continue urotraxal 10 mg daily, Proscar 5 every morning Acidosis-resolved stop sodium bicarb 4/15 Hyperkalemia/metabolic acidosis secondary to sepsis probably from pneumonia Lokelma given x1 with good effect No further labs Basal cell CA Gout-adjust meds according to renal function per pharmacy Neuropathy-discontinue completely gabapentin 4/17 given encephalopathy   DVT prophylaxis: Lovenox Code Status: DNR--hospice approprate Family Communication: Extensive discussion with daughter Lonnie [name] on telephone [phone] authorization for discharge according to case management.- He will d/c to SNF with Hospice-I have had a long discussion with his daughter Lonnie [name] on the phone over several days and given his gradual decline in the hospital with metabolic encephalopathy and delirium-we both feel and are of the opinion that he is hospice appropriate He will discharge to [facility] with hospice in the next 24 hours I have discontinued all nonessential meds Disposition:  Status is: Inpatient  Remains inpatient appropriate because:Hemodynamically unstable, Ongoing diagnostic testing needed not appropriate for outpatient work up, Unsafe d/c plan and IV treatments appropriate due to intensity of illness or inability to take PO   Dispo: The patient is from: SNF              Anticipated d/c is to: SNF              Patient currently is not medically stable to d/c.   Difficult to place patient No    Consultants:   Not currently  Procedures: multiple  Antimicrobials: As above   Subjective:  Very  confused cannot orient to time place person  Objective: Vitals:   01/17/21 2338 01/18/21 0401 01/18/21 0450 01/18/21 0817  BP: (!) 154/78 (!) 149/84 Marland Kitchen)  155/70 (!) 169/77  Pulse: 77 79 79 78  Resp: 18 16 16 17   Temp:  97.6 F (36.4 C) 97.8 F (36.6 C) 97.8 F (36.6 C)  TempSrc: Oral Oral Oral   SpO2: 95% 96% 100% 96%  Weight:      Height:        Intake/Output Summary (Last 24 hours) at 01/18/2021 1538 Last data filed at 01/18/2021 0401 Gross per 24 hour  Intake --  Output 100 ml  Net -100 ml   Filed Weights   01/12/21 1442 01/13/21 0501 01/15/21 0406  Weight: 84.8 kg 74.9 kg 75.3 kg    Examination:  Arousable-confused CTA B relatively clear S1-S2 no murmur Abdomen soft no rebound Neurologically moves all 4 limbs equally Psychiatric-confused   Data Reviewed: personally reviewed    Radiology Studies: No results found.   Scheduled Meds: . alfuzosin  10 mg Oral Q breakfast  . amoxicillin-clavulanate  1 tablet Oral Q12H  . Chlorhexidine Gluconate Cloth  6 each Topical Daily  . enoxaparin (LOVENOX) injection  40 mg Subcutaneous Q24H  . finasteride  5 mg Oral BH-q7a  . mouth rinse  15 mL Mouth Rinse BID  . mometasone-formoterol  2 puff Inhalation BID  . nystatin   Topical TID  . pantoprazole  40 mg Oral Daily  . senna-docusate  2 tablet Oral Daily   Continuous Infusions:    LOS: 6 days   Time spent: [place], MD [facility] To contact the attending provider between 7A-7P or the covering provider during after hours 7P-7A, please log into the web site [email or web address] and access using universal [place] password for that web site. If you do not have the password, please call the hospital operator.  01/18/2021, 3:38 PM

## 2021-01-18 NOTE — Progress Notes (Signed)
Patient increasingly confused throughout shift. Refused vitals and lunch. RN assuming care aware. Orma Flaming, RN

## 2021-01-18 NOTE — Progress Notes (Addendum)
   01/18/21 0450  Assess: MEWS Score  Temp 97.8 F (36.6 C)  BP (!) 155/70  Pulse Rate 79  Resp 16  SpO2 100 %  O2 Device Room Air  Assess: MEWS Score  MEWS Temp 0  MEWS Systolic 0  MEWS Pulse 0  MEWS RR 0  MEWS LOC 0  MEWS Score 0  MEWS Score Color Green  Pt had 5 beat run of vtach. Vitals obtained and stable. Dr. Sidney Ace contacted and informed of event. Last potassium on 4/17 and that a mag level hasn't been drawn since march. MD ordered to add mag level. Transcribed via amion to give 2gm of magnesium sulfate IV if mag level less than 2.

## 2021-01-18 NOTE — Progress Notes (Signed)
SLP Cancellation Note  Patient Details Name: Lonnie [name]. MRN: [id number] DOB: [birth year]   Cancelled treatment:        Patient diet liberalized to regular/thin over the weekend per MD as pt with poor intake. Per RN, nurse tech pt refused breakfast and is refusing lunch today, which had just arrived when SLP visited the floor. Pt did not exhibit overt signs of aspiration during last session on 4/14; he has continued to refuse solids. Suspect primary esophageal dysphagia vs oropharyngeal dysphagia. Recommend esophageal precautions to minimize risk of post-prandial aspiration; no further ST indicated at this time.                                                                                              Deneise Lever, [place], CCC-SLP Speech-Language Pathologist    Aliene Altes 01/18/2021, 1:23 PM

## 2021-01-19 ENCOUNTER — Other Ambulatory Visit: Payer: No Typology Code available for payment source | Admitting: Radiology

## 2021-01-19 ENCOUNTER — Inpatient Hospital Stay: Payer: No Typology Code available for payment source

## 2021-01-19 DIAGNOSIS — G9341 Metabolic encephalopathy: Secondary | ICD-10-CM | POA: Diagnosis not present

## 2021-01-19 HISTORY — PX: IR CATHETER TUBE CHANGE: IMG717

## 2021-01-19 MED ORDER — IODIXANOL 320 MG/ML IV SOLN
50.0000 mL | Freq: Once | INTRAVENOUS | Status: AC | PRN
  Administered 2021-01-19: 10 mL

## 2021-01-19 MED ORDER — HALOPERIDOL LACTATE 5 MG/ML IJ SOLN: 1.0000 mg | Freq: Every evening | INTRAMUSCULAR | Status: DC | PRN

## 2021-01-19 NOTE — Progress Notes (Signed)
PROGRESS NOTE   Lonnie [name].  FXT:[id number] DOB: 1927/10/02 DOA: 01/12/2021 PCP: Derinda Late, MD  Brief Narrative:   75 Caucasian male [place] resident morning acute cholecystitis status post [facility] (follows with Dr. Peyton Najjar general surgery)-cultures yielded Enterococcus VCM cholecystectomy HTN Gout Basal cell CA status post excision 12/01/2020 Recent hospitalization recent hospitalization 316-12/24/2020 for prolonged ileus NG tube placed-also had wide-complex tachycardia on admission AKI superimposed on CKD (had difficulty voiding requiring Foley catheterization) nephrology saw the patient at the time  Brought into [facility] ED toxic metabolic encephalopathy-O2 sat 80%-reports anorexia-oriented only x2 in the ED-H/O from daughter-work-up revealed leukocytosis hyperkalemia 5.9 urine?  Cystitis-CT abdomen pelvis?  Lower lobe infiltrates on lung Rx Rocephin, IVF, Lokelma, Lasix in ED  Foley eventually done 4/13 fractional excretion of sodium =0.8% indicating prerenal etiology-but had post-obst symtp and >600 cc in bladder  Patient vacillating hospital course compounded by metabolic encephalopathy probably from medication He is now DNR and is likely Palliative and hospice eligible  Hospital-Problem based course  Toxic metabolic encephalopathy on admission?  Secondary to cystitis/aspiration pneumonia azithromycin cefepime-->augmentin-stop date 4/18 3rd hospitalization for patient in several months Family realistic re: GOC--now DNR Suspect will need to go to [facility] with hospice/pallaitve Aspiration with hypoxic respiratory failure on admission SLP suspects esophageal dysphagia 2 diet--diet liberalized [poor appetite not eating] He is now also refusing meds (I have minimize meds) cont aspiration precautions-no further diagnostics Depression-situational Metabolic encephalopathy Continues to be slightly confused thinks he is interim, thinks this is 2028 and cannot orient well Stop  all psychotropics Perc cholecystotomy followed by general surgeon Dr. Peyton Najjar Patient pulled out [facility] cholecystectomy drain 4/19 Supposed to have drain study versus further therapeutics 4/20 He pulled out the drain on 4/19 and we have replaced it with the assistance of interventional radiologist Dr. Kathlene Cote appreciate the assistance AKI superimposed on CKD 3--2/2 Bladder outlet obstruction FeNa 0.8%  bladdr scanned and 675 cc Needs indwelling foley--Clamping trials were unsuccessful Continue urotraxal 10 mg daily, Proscar 5 every morning Acidosis-resolved stop sodium bicarb 4/15 Hyperkalemia/metabolic acidosis secondary to sepsis probably from pneumonia Lokelma given x1 with good effect No further labs Basal cell CA Gout-adjust meds according to renal function per pharmacy Neuropathy-discontinue completely gabapentin 4/17 given encephalopathy   DVT prophylaxis: Lovenox Code Status: DNR--hospice appropriate Family Communication: Extensive discussion with daughter [name] on telephone [phone] authorization for discharge according to case management.- He will d/c to SNF with Hospice-I have had a long discussion with his daughter [name] on the phone over several days-last spoke to her 4/18-and given his gradual decline in the hospital with metabolic encephalopathy and delirium-we both feel and are of the opinion that he is hospice appropriate He will discharge to [facility] when authorization is approved I have discontinued nonessential meds and have discontinued labs Disposition:  Status is: Inpatient  Remains inpatient appropriate because:Hemodynamically unstable, Ongoing diagnostic testing needed not appropriate for outpatient work up, Unsafe d/c plan and IV treatments appropriate due to intensity of illness or inability to take PO   Dispo: The patient is from: SNF              Anticipated d/c is to: SNF              Patient currently is not medically stable to d/c.    Difficult to place patient No    Consultants:   Not currently  Procedures: multiple  Antimicrobials: As above   Subjective:  Very confused-nurse tells me he thought that  a bear was biting at his leg earlier today He is alert however cannot orient to time place person  Objective: Vitals:   01/19/21 0811 01/19/21 1240 01/19/21 1242 01/19/21 1713  BP: (!) 167/74 (!) 144/57 (!) 144/57 (!) 166/85  Pulse: 81 70 68 80  Resp: 20 16 16 18   Temp: 97.8 F (36.6 C) 97.9 F (36.6 C) 97.9 F (36.6 C) 98 F (36.7 C)  TempSrc:  Oral Oral   SpO2: 95%   98%  Weight:      Height:        Intake/Output Summary (Last 24 hours) at 01/19/2021 1749 Last data filed at 01/19/2021 1114 Gross per 24 hour  Intake --  Output 2000 ml  Net -2000 ml   Filed Weights   01/12/21 1442 01/13/21 0501 01/15/21 0406  Weight: 84.8 kg 74.9 kg 75.3 kg    Examination:  Arousable-confused CTA B relatively clear S1-S2 no murmur no rub no gallop Abdomen soft no rebound Neurologically moves all 4 limbs equally Psychiatric-confused   Data Reviewed: personally reviewed   No further labs are indicated at this juncture  Radiology Studies: IR Catheter Tube Change  Result Date: 01/19/2021 INDICATION: Status post percutaneous cholecystostomy tube placement to treat acute cholecystitis on 12/09/2020. There is evidence of possible tube retraction at the bedside during current hospitalization. EXAM: CHOLANGIOGRAM THROUGH PRE-EXISTING CHOLECYSTOSTOMY TUBE UNDER FLUOROSCOPY MEDICATIONS: None ANESTHESIA/SEDATION: None FLUOROSCOPY TIME:  Fluoroscopy Time: 30 seconds.  3.7 mGy. CONTRAST:  10 mL Visipaque [id number] COMPLICATIONS: None immediate. PROCEDURE: Informed written consent was obtained from the patient's daughter after a thorough discussion of the procedural risks, benefits and alternatives. All questions were addressed. Maximal Sterile Barrier Technique was utilized including caps, mask, sterile gowns, sterile  gloves, sterile drape, hand hygiene and skin antiseptic. A timeout was performed prior to the initiation of the procedure. The pre-existing cholecystostomy tube was injected with contrast material and multiple fluoroscopic imaging loops were saved. The tube was then reconnected to a new gravity drainage bag. Additional 0 Prolene retention suture was applied at the catheter exit site. FINDINGS: The cholecystostomy tube is well position within the gallbladder lumen and does not appear to have retracted significantly. The tube is normally patent with contrast injection demonstrating a decompressed gallbladder, flow via a patent cystic duct and opacification of the common bile duct. The distal portion of the common bile duct shows mild dilatation and irregularity without discrete filling defect identified. There is eventual visualization of contrast entering the duodenum. IMPRESSION: Appropriate positioning of cholecystostomy tube. The tube did not require exchange or replacement. An additional retention suture was applied at the catheter exit site. Cholangiogram demonstrates a decompressed gallbladder, patent cystic duct and patent common bile duct with mild dilatation and irregularity of the distal common bile duct. No discrete calculi identified in the bile ducts. Electronically Signed   By: Aletta Edouard M.D.   On: 01/19/2021 14:12     Scheduled Meds: . alfuzosin  10 mg Oral Q breakfast  . Chlorhexidine Gluconate Cloth  6 each Topical Daily  . enoxaparin (LOVENOX) injection  40 mg Subcutaneous Q24H  . finasteride  5 mg Oral BH-q7a  . mouth rinse  15 mL Mouth Rinse BID  . mometasone-formoterol  2 puff Inhalation BID  . nystatin   Topical TID  . pantoprazole  40 mg Oral Daily  . senna-docusate  2 tablet Oral Daily   Continuous Infusions:    LOS: 7 days   Time spent: [place], MD  [facility] To contact the attending provider between 7A-7P or the covering provider during  after hours 7P-7A, please log into the web site [email or web address] and access using universal [place] password for that web site. If you do not have the password, please call the hospital operator.  01/19/2021, 5:49 PM

## 2021-01-19 NOTE — Sedation Documentation (Signed)
No sedation for this procedure

## 2021-01-19 NOTE — TOC Progression Note (Signed)
Transition of Care (TOC) - Progression Note    Patient Details  Name: Lonnie [name]. MRN: [id number] Date of Birth: 08/04/1927  Transition of Care [facility]) CM/SW Contact  Shelbie Hutching, RN Phone Number: 01/19/2021, 4:20 PM  Clinical Narrative:    [facility] has still not gotten authorization from the [place].  TOC will follow up tomorrow.    Expected Discharge Plan: Skilled Nursing Facility Barriers to Discharge: Insurance Authorization  Expected Discharge Plan and Services Expected Discharge Plan: [place]     Post Acute Care Choice:  (TBD) Living arrangements for the past 2 months: [place]                                       Social Determinants of Health (SDOH) Interventions    Readmission Risk Interventions Readmission Risk Prevention Plan 12/18/2020  Transportation Screening Complete  PCP or Specialist Appt within 3-5 Days Complete  HRI or [place] Complete  Social Work Consult for [place] Planning/Counseling Complete  Palliative Care Screening Not Applicable  Medication Review Press photographer) Complete  Some recent data might be hidden

## 2021-01-19 NOTE — Progress Notes (Signed)
On request of Dr. Kathlene Cote, [employer], contacted patient's daughter, Lonnie [name], via telephone to discuss need for evaluation of cholecystostomy tube/drainage system.  Called Lonnie [name] at [phone] and Dr. Kathlene Cote discussed the request he received from the primary team to evaluate the cholecystostomy tube for malposition and to replace it if necessary.  Dr. Kathlene Cote answered all of Lonnie [name]'s questions and obtained consent to proceed.  I was present for the telephone discussion and also spoke with Lonnie [name] to confirm the information and witness consent.  Patient was brought to the vascular lab (see Dr. Margaretmary Dys note).  Patient did not require/receive sedation for this procedure.  Patient returned to his room and face-to-face report provided to [place], Therapist, sports. In addition, called Lonnie [name] and updated her post procedure.  She did have questions regarding care of the cholecystostomy tube/drainage system once patient discharges and she states she plans to talk with primary team attending today and encouraged her to discuss this question and any other questions.

## 2021-01-19 NOTE — Procedures (Signed)
Interventional Radiology Procedure Note  Procedure: Cholangiogram under fluoro  Complications: None  Estimated Blood Loss: None  Findings: Cholecystostomy tube is well positioned in GB lumen and has not retracted. Cholangiogram shows decompressed GB and patent cystic and common bile ducts. Cholecystostomy retention bolstered with second retention suture and tube connected to new gravity drainage bag.  Venetia Night. Kathlene Cote, M.D Pager:  [phone]

## 2021-01-19 NOTE — Progress Notes (Signed)
This RN called to room via assigned NT. Pt found to have pulled stiches out of bilary tube and was attempting to pull out foley per NT report. MD notified and IR called to verify proper placement of tube. Mitts placed on pt. And dressing reinforced with extra tape to discourage further pulling of bilary tube. Pt is actively refusing meds as well. MD, Nada Libman also made aware of medication refusal.  Pt repeatedly states " I don't want it, I don't want any of it". Continuing to monitor pt.

## 2021-01-20 ENCOUNTER — Other Ambulatory Visit: Payer: No Typology Code available for payment source

## 2021-01-20 DIAGNOSIS — R627 Adult failure to thrive: Secondary | ICD-10-CM

## 2021-01-20 DIAGNOSIS — N390 Urinary tract infection, site not specified: Secondary | ICD-10-CM

## 2021-01-20 DIAGNOSIS — N179 Acute kidney failure, unspecified: Secondary | ICD-10-CM | POA: Diagnosis not present

## 2021-01-20 DIAGNOSIS — G9341 Metabolic encephalopathy: Secondary | ICD-10-CM | POA: Diagnosis not present

## 2021-01-20 DIAGNOSIS — N189 Chronic kidney disease, unspecified: Secondary | ICD-10-CM

## 2021-01-20 MED ORDER — MIRTAZAPINE 15 MG PO TBDP
7.5000 mg | ORAL_TABLET | Freq: Every day | ORAL | Status: DC
  Administered 2021-01-20 – 2021-01-21 (×2): 7.5 mg via ORAL
  Filled 2021-01-20 (×3): qty 0.5

## 2021-01-20 MED ORDER — ENSURE ENLIVE PO LIQD
237.0000 mL | Freq: Three times a day (TID) | ORAL | Status: DC
  Administered 2021-01-20 – 2021-01-22 (×5): 237 mL via ORAL

## 2021-01-20 NOTE — Progress Notes (Signed)
PROGRESS NOTE   Lonnie [name].  WFU:[id number] DOB: 1927/09/28 DOA: 01/12/2021 PCP: Derinda Late, MD   Brief Narrative:   40 Caucasian male [place] resident, with past medical history of hypertension, gout, basal cell carcinoma s/p excision 12/01/2020,Recent hospitalization recent hospitalization 3/16-3/24  significant foracute cholecystitis status post percutaneous cholecystostomy and prolonged ileus NG tube placed-also had wide-complex tachycardia , ERI, and urinary retention requiring Foley catheter insertion Brought into [facility] ED toxic metabolic encephalopathy-O2 sat 80%, thought to be secondary to infection from UTI and aspiration pneumonia. Overall patient is very weak, frail and deconditioned, with significant encephalopathy, with very poor oral intake, has been seen by palliative medicine, he is currently DNR, with goal for placement is facility with hospice.  Hospital-Problem based course  Toxic metabolic encephalopathy on admission -Most likely in the setting of infectious process including cystitis/aspiration pneumonia, immobility and deconditioning with multiple hospitalization over the last several month .  Aspiration with hypoxic respiratory failure on admissio - SLP suspects esophageal dysphagia 2 diet -diet liberalized [poor appetite not eating - cont aspiration precautions-no further diagnostics -Treated with IV antibiotics  UTI -Treated with antibiotics  Perc cholecystotomy followed by general surgeon Dr. Peyton Najjar -Supposed to have drain study versus further therapeutics 4/20, We will cancel interventional radiology appointments going forward he will keep indwelling cholecystotomy at this time given trajectory of care  AKI superimposed on CKD 3--2/2 Bladder outlet obstruction -With evidence of retention  Hyperkalemia/metabolic acidosis secondary to sepsis probably from pneumonia -Lokelma given x1 with good effect - No further labs  Basal cell  CA  Gout-adjust meds according to renal function per pharmacy  Neuropathy-discontinue completely gabapentin 4/17 given encephalopathy  Failure to thrive  Goals of care -Patient is extremely frail, deconditioned, with significant failure to thrive, with multiple comorbidities, patient is currently DNR, plan is to discharge to hospice facility(awaiting insurance authorization)   DVT prophylaxis: Lovenox Code Status: DNR--hospice approprate Family Communication:None at bedside Disposition:  Status is: Inpatient  Remains inpatient appropriate because:Hemodynamically unstable, Ongoing diagnostic testing needed not appropriate for outpatient work up, Unsafe d/c plan and IV treatments appropriate due to intensity of illness or inability to take PO   Dispo: The patient is from: SNF              Anticipated d/c is to: SNF with hospice              Patient currently is not medically stable to d/c.  Appropriate for discharge to facility with hospice, awaiting bed availability.   Difficult to place patient No    Consultants:   Palliative  IR   Procedures: multiple  Antimicrobials: As above   Subjective:  Very confused, can't any questions or provide any complaints.  Objective: Vitals:   01/19/21 1713 01/19/21 2014 01/20/21 0347 01/20/21 0834  BP: (!) 166/85 (!) 154/79 (!) 165/72 (!) 166/77  Pulse: 80 79 76 73  Resp: 18 18 20 17   Temp: 98 F (36.7 C) 98.4 F (36.9 C) 98.2 F (36.8 C) 98.1 F (36.7 C)  TempSrc:      SpO2: 98% 97% 99% 99%  Weight:      Height:        Intake/Output Summary (Last 24 hours) at 01/20/2021 0917 Last data filed at 01/20/2021 0900 Gross per 24 hour  Intake 0 ml  Output 2070 ml  Net -2070 ml   Filed Weights   01/12/21 1442 01/13/21 0501 01/15/21 0406  Weight: 84.8 kg 74.9 kg 75.3 kg  Examination:   Arousable, confused, moaning intermittently Clear to auscultation bilaterally  Regular rate and rhythm, no rubs or gallops  abdomen  soft, nontender, bowel sounds present, right upper quadrant drain  EXT  with no edema, clubbing or cyanosis   Data Reviewed: personally reviewed    Radiology Studies: IR Catheter Tube Change  Result Date: 01/19/2021 INDICATION: Status post percutaneous cholecystostomy tube placement to treat acute cholecystitis on 12/09/2020. There is evidence of possible tube retraction at the bedside during current hospitalization. EXAM: CHOLANGIOGRAM THROUGH PRE-EXISTING CHOLECYSTOSTOMY TUBE UNDER FLUOROSCOPY MEDICATIONS: None ANESTHESIA/SEDATION: None FLUOROSCOPY TIME:  Fluoroscopy Time: 30 seconds.  3.7 mGy. CONTRAST:  10 mL Visipaque 250 COMPLICATIONS: None immediate. PROCEDURE: Informed written consent was obtained from the patient's daughter after a thorough discussion of the procedural risks, benefits and alternatives. All questions were addressed. Maximal Sterile Barrier Technique was utilized including caps, mask, sterile gowns, sterile gloves, sterile drape, hand hygiene and skin antiseptic. A timeout was performed prior to the initiation of the procedure. The pre-existing cholecystostomy tube was injected with contrast material and multiple fluoroscopic imaging loops were saved. The tube was then reconnected to a new gravity drainage bag. Additional 0 Prolene retention suture was applied at the catheter exit site. FINDINGS: The cholecystostomy tube is well position within the gallbladder lumen and does not appear to have retracted significantly. The tube is normally patent with contrast injection demonstrating a decompressed gallbladder, flow via a patent cystic duct and opacification of the common bile duct. The distal portion of the common bile duct shows mild dilatation and irregularity without discrete filling defect identified. There is eventual visualization of contrast entering the duodenum. IMPRESSION: Appropriate positioning of cholecystostomy tube. The tube did not require exchange or replacement. An  additional retention suture was applied at the catheter exit site. Cholangiogram demonstrates a decompressed gallbladder, patent cystic duct and patent common bile duct with mild dilatation and irregularity of the distal common bile duct. No discrete calculi identified in the bile ducts. Electronically Signed   By: Aletta Edouard M.D.   On: 01/19/2021 14:12     Scheduled Meds: . alfuzosin  10 mg Oral Q breakfast  . Chlorhexidine Gluconate Cloth  6 each Topical Daily  . enoxaparin (LOVENOX) injection  40 mg Subcutaneous Q24H  . finasteride  5 mg Oral BH-q7a  . mouth rinse  15 mL Mouth Rinse BID  . mometasone-formoterol  2 puff Inhalation BID  . nystatin   Topical TID  . pantoprazole  40 mg Oral Daily  . senna-docusate  2 tablet Oral Daily   Continuous Infusions:    LOS: 8 days    Phillips Climes, MD [facility] To contact the attending provider between 7A-7P or the covering provider during after hours 7P-7A, please log into the web site [email or web address] and access using universal [place] password for that web site. If you do not have the password, please call the hospital operator.  01/20/2021, 9:17 AM

## 2021-01-20 NOTE — Consult Note (Signed)
[facility] Consult   Reason for Consult: Consult for 87 year old gentleman in the hospital with confusion and multiple medical problems.  Concern about suicidal statement Referring Physician:  Elgergawy Patient Identification: Lonnie [name]. MRN:  [id number] Principal Diagnosis: Acute metabolic encephalopathy Diagnosis:  Principal Problem:   Acute metabolic encephalopathy Active Problems:   Pneumonia   Acute lower UTI   Acute kidney injury superimposed on CKD (HCC)   Constipation   AMS (altered mental status)   Hyperkalemia   Hyponatremia   Metabolic acidosis, normal anion gap (NAG)   Acute respiratory failure (HCC)   Pressure injury of skin   Total Time spent with patient: 1 hour  Subjective:   Lonnie A Gared Gillie. is a 87 y.o. male patient admitted with "I need some water".  HPI: Patient seen chart reviewed.  Also spoke with his daughter on the telephone.  38 with 35-year-old man who was brought to the hospital with altered mental status.  Multiple metabolic problems.  Has not been eating well.  Generally declining.  Daughter says that to her he appears to have all the features of "failure to thrive".  Today on nurse reported that he had explicitly asked her if she would help him to kill himself and made a gun gesture at his head.  Today I found the patient dozing but easy to wake up.  He did engage in Some conversation.  He knew the name of the hospital and knew the correct year and month.  Patient admitted that he was feeling down and tired.  He denied however having any thoughts or wish to die or to kill himself.  Denied having made the documented comments about killing himself.  He sort of shut down farther when I ask more questions about depression.  He was able to tell me that the thing that makes him happiest is to see his dog.  Patient while I was there was not agitated and did not appear acutely delirious.  I understand he has had episodes of delirium and  pulling on tubes and lines within the last day or so.  Past Psychiatric History: Denies any past psychiatric history no past psychiatric history documented no history of suicide attempts or hospitalization.  Risk to Self:   Risk to Others:   Prior Inpatient Therapy:   Prior Outpatient Therapy:    Past Medical History:  Past Medical History:  Diagnosis Date  . Anemia   . Basal cell carcinoma 11/13/2009   Right alar groove  . Basal cell carcinoma 04/28/2010   left preauricular, BCC with focal sclerosis. Exc. 06/04/2010  . Basal cell carcinoma 04/24/2013   right post auricular  . Basal cell carcinoma 10/26/2017   right inferior cheek above mandible, Superficial  . Basal cell carcinoma 03/22/2018   right post. base of neck  . Basal cell carcinoma 05/08/2018   post. neck, spinal, Superficial. EDC 05/08/2018  . Basal cell carcinoma 07/23/2018   left mid supraclavicular, Superficial and nodular pattern. EDC  . Basal cell carcinoma 07/23/2018   right superior chest parasternal. Superficial and nodular pattern. EDC  . Basal cell carcinoma 05/16/2019   left lateral neck. Superficial and nodular pattern. EDC  . Basal cell carcinoma 11/07/2019   left lateral forehead. Nodular. EDC  . Basal cell carcinoma 12/23/2019   Left ala nasi. BCC with sclerosis.  . Basal cell carcinoma 11/19/2020   L ear post aspect,excised 12/01/20  . Bundle branch block, left   . CKD (chronic kidney  disease) stage 3, GFR 30-59 ml/min (HCC)   . COPD (chronic obstructive pulmonary disease) ([place])   . Coronary artery disease   . Family history of adverse reaction to anesthesia    DAUGHTER-N/V  . GERD (gastroesophageal reflux disease)   . History of asbestosis   . HLD (hyperlipidemia)   . Hypertension   . Pneumonia 2017  . PSA elevation     Past Surgical History:  Procedure Laterality Date  . APPENDECTOMY    . COLONOSCOPY WITH ESOPHAGOGASTRODUODENOSCOPY (EGD)    . CYSTOSCOPY WITH INSERTION OF UROLIFT N/A  10/06/2020   Procedure: CYSTOSCOPY WITH INSERTION OF UROLIFT;  Surgeon: Abbie Sons, MD;  Location: [facility] ORS;  Service: Urology;  Laterality: N/A;  . IR CATHETER TUBE CHANGE  01/19/2021  . RECTAL SURGERY     x3 ABSCESS   Family History:  Family History  Problem Relation Age of Onset  . Heart attack Mother   . Heart attack Father    Family Psychiatric  History: None reported Social History:  Social History   Substance and Sexual Activity  Alcohol Use Yes  . Alcohol/week: 0.0 standard drinks   Comment: OCC RUM AND COKE     Social History   Substance and Sexual Activity  Drug Use No    Social History   Socioeconomic History  . Marital status: Widowed    Spouse name: Not on file  . Number of children: Not on file  . Years of education: Not on file  . Highest education level: Not on file  Occupational History  . Not on file  Tobacco Use  . Smoking status: Former Smoker    Packs/day: 2.00    Years: 50.00    Pack years: 100.00    Types: Cigarettes    Quit date: 09/24/1989    Years since quitting: 31.3  . Smokeless tobacco: Never Used  Vaping Use  . Vaping Use: Never used  Substance and Sexual Activity  . Alcohol use: Yes    Alcohol/week: 0.0 standard drinks    Comment: OCC RUM AND COKE  . Drug use: No  . Sexual activity: Not Currently  Other Topics Concern  . Not on file  Social History Narrative  . Not on file   Social Determinants of Health   Financial Resource Strain: Not on file  Food Insecurity: Not on file  Transportation Needs: Not on file  Physical Activity: Not on file  Stress: Not on file  Social Connections: Not on file   Additional Social History:    Allergies:   Allergies  Allergen Reactions  . Sulfa Antibiotics Other (See Comments)    Not sure of reaction. It was too long ago, but he said not to give it to him.   . Sulfasalazine     Other reaction(s): Other (See Comments) Not sure of reaction. It was too long ago, but he said not  to give it to him.   Marland Kitchen Penicillin G Rash    Tolerates ampicillin and amoxicillin    Labs: No results found for this or any previous visit (from the past 48 hour(s)).  Current Facility-Administered Medications  Medication Dose Route Frequency Provider Last Rate Last Admin  . alfuzosin (UROXATRAL) 24 hr tablet 10 mg  10 mg Oral Q breakfast Agbata, Tochukwu, MD   10 mg at 01/20/21 1035  . Chlorhexidine Gluconate Cloth 2 % PADS 6 each  6 each Topical Daily Nita Sells, MD   6 each at 01/20/21 0935  .  enoxaparin (LOVENOX) injection 40 mg  40 mg Subcutaneous Q24H Nita Sells, MD   40 mg at 01/18/21 2055  . feeding supplement (ENSURE ENLIVE / ENSURE PLUS) liquid 237 mL  237 mL Oral TID BM Elgergawy, Silver Huguenin, MD      . finasteride (PROSCAR) tablet 5 mg  5 mg Oral BH-q7a Agbata, Tochukwu, MD   5 mg at 01/20/21 1035  . haloperidol lactate (HALDOL) injection 1 mg  1 mg Intravenous QHS PRN Nita Sells, MD      . MEDLINE mouth rinse  15 mL Mouth Rinse BID Agbata, Tochukwu, MD   15 mL at 01/20/21 1035  . mirtazapine (REMERON SOL-TAB) disintegrating tablet 7.5 mg  7.5 mg Oral QHS Latera Mclin T, MD      . mometasone-formoterol (DULERA) 200-5 MCG/ACT inhaler 2 puff  2 puff Inhalation BID Agbata, Tochukwu, MD   2 puff at 01/20/21 1035  . nystatin (MYCOSTATIN/NYSTOP) topical powder   Topical TID Collier Bullock, MD   Given at 01/20/21 1341  . ondansetron (ZOFRAN) tablet 4 mg  4 mg Oral Q6H PRN Agbata, Tochukwu, MD       Or  . ondansetron (ZOFRAN) injection 4 mg  4 mg Intravenous Q6H PRN Agbata, Tochukwu, MD   4 mg at 01/16/21 0800  . pantoprazole (PROTONIX) EC tablet 40 mg  40 mg Oral Daily Agbata, Tochukwu, MD   40 mg at 01/20/21 1035  . senna-docusate (Senokot-S) tablet 2 tablet  2 tablet Oral Daily Agbata, Tochukwu, MD   2 tablet at 01/20/21 1035    Musculoskeletal: Strength & Muscle Tone: decreased Gait & Station: unable to stand Patient leans:  N/A            Psychiatric Specialty Exam:  Presentation  General Appearance: No data recorded Eye Contact:No data recorded Speech:No data recorded Speech Volume:No data recorded Handedness:No data recorded  Mood and Affect  Mood:No data recorded Affect:No data recorded  Thought Process  Thought Processes:No data recorded Descriptions of Associations:No data recorded Orientation:No data recorded Thought Content:No data recorded History of Schizophrenia/Schizoaffective disorder:No data recorded Duration of Psychotic Symptoms:No data recorded Hallucinations:No data recorded Ideas of Reference:No data recorded Suicidal Thoughts:No data recorded Homicidal Thoughts:No data recorded  Sensorium  Memory:No data recorded Judgment:No data recorded Insight:No data recorded  Executive Functions  Concentration:No data recorded Attention Span:No data recorded Recall:No data recorded Fund of Knowledge:No data recorded Language:No data recorded  Psychomotor Activity  Psychomotor Activity:No data recorded  Assets  Assets:No data recorded  Sleep  Sleep:No data recorded  Physical Exam: Physical Exam Vitals and nursing note reviewed.  Constitutional:      Appearance: Normal appearance. He is ill-appearing.  HENT:     Head: Normocephalic and atraumatic.     Mouth/Throat:     Mouth: Mucous membranes are dry.     Pharynx: Oropharynx is clear.  Eyes:     Pupils: Pupils are equal, round, and reactive to light.  Cardiovascular:     Rate and Rhythm: Normal rate and regular rhythm.  Pulmonary:     Effort: Pulmonary effort is normal.     Breath sounds: Normal breath sounds.  Abdominal:     General: Abdomen is flat.     Palpations: Abdomen is soft.  Skin:    General: Skin is warm and dry.  Neurological:     General: No focal deficit present.     Mental Status: He is alert. Mental status is at baseline.  Psychiatric:  Attention and Perception: He is  inattentive.        Mood and Affect: Mood is depressed. Affect is blunt.        Speech: Speech is delayed.        Behavior: Behavior is slowed.        Cognition and Memory: Cognition is impaired. Memory is impaired.        Judgment: Judgment is impulsive.    Review of Systems  Constitutional: Positive for malaise/fatigue and weight loss.  HENT: Negative.   Eyes: Negative.   Respiratory: Negative.   Cardiovascular: Negative.   Gastrointestinal: Negative.   Musculoskeletal: Negative.   Skin: Negative.   Neurological: Negative.   Psychiatric/Behavioral: Positive for depression. Negative for hallucinations, substance abuse and suicidal ideas. The patient is nervous/anxious and has insomnia.    Blood pressure (!) 150/68, pulse 76, temperature 98.2 F (36.8 C), resp. rate 18, height 5\' 8"  (1.727 m), weight 75.3 kg, SpO2 99 %. Body mass index is 25.24 kg/m.  Treatment Plan Summary: Medication management and Plan Patient with weakness multiple medical problems confusion.  Somewhat hard to know when to call something frank depression but the suicidal statements do suggest a hopelessness.  I think it is a good idea to maintain the sitter for now because he is at high risk of pulling a lion or hurting himself.  If it is discontinued at night it would probably be a good idea to have mittens and extra guards to prevent him from getting out of bed.  I am going to suggest a small 7.5 mg dose of mirtazapine dissolving tablet at night for depression weight loss and to help with sleep.  Spoke with daughter about this.  Will continue to follow as needed.  Disposition: Patient does not meet criteria for psychiatric inpatient admission. Supportive therapy provided about ongoing stressors. Discussed crisis plan, support from social network, calling 911, coming to the Emergency Department, and calling Suicide Hotline.  Alethia Berthold, MD 01/20/2021 4:53 PM

## 2021-01-20 NOTE — Progress Notes (Signed)
This RN entered pt room performing hourly rounding. Pt had mitts off had removed foley stat lock and biliary tube dressing. I called out and requested new stat lock to place on pt. As I was caring for lines and drains the pt said to this RN, Regino Schultze, "I want to die." This RN asked pt what he had said and he repeated. "I want to die. Will you help me". I then asked the pt "help you with what?" The pt. Replied "Help me kill my self." I stated to the pt "I am sorry. I can not do that. Are you wanting to kill your self?". The pt did not respond. I then asked the pt if he had plans to kill himself. The pt nodded. I asked the pt if he had "plans of how he would kill himself?" The pt his index finger and thumb in the direction of his lateral right forehead and made a blowing noise with his lips while bending his thumb downward. The nurse tech Luvenia Starch had brought the foley stat lock into the pt's room at this time. I remained with the pt and asked Jade to please ask the secretary to page the MD. MD, D. Elgergawy responded promptly and placed suicidal precautions. Precautions were implemented immediately. Psychology consult is in place.

## 2021-01-20 NOTE — Progress Notes (Signed)
Patient with severe deconditioning, severe malnutrition, with very poor oral intake, and failure to thrive pneumonia, encephalopathy, chronic multiple comorbidities, upon my current evaluation and assessment, I do believe patient has less than 6 months to live. Phillips Climes MD

## 2021-01-20 NOTE — Progress Notes (Signed)
Initial Nutrition Assessment  DOCUMENTATION CODES:  Severe malnutrition in context of chronic illness  INTERVENTION:   Continue current diet as ordered, nursing staff to assist with meals.  Ensure Enlive po TID, each supplement provides 350 kcal and 20 grams of protein  NUTRITION DIAGNOSIS:  Severe Malnutrition (in the context of chronic illness) related to poor appetite as evidenced by severe muscle depletion,severe fat depletion,percent weight loss (11.2% in 3 months).  GOAL:  Patient will meet greater than or equal to 90% of their needs  MONITOR:  Supplement acceptance,PO intake  REASON FOR ASSESSMENT:  Malnutrition Screening Tool    ASSESSMENT:  Pt admitted from nursing facility with AMS and hypoxia. Reported very poor intake PTA. Daughter reports pt has become increasingly weak with poor PO intake for several months. Workup in ED suggestive of aspiration pneumonia. PMH relevant for COPD, HTN, CAD, GERD, HLD, CKD stage 3, basal cell carcinoma, gout. Percutaneous cholecystostomy in place on admission from recent acute cholecystitis.  Pt resting in bed at the time of assessment, mittens in place as pt was pulling at lines and drains. SLP recommended Dys 2 diet but was liberalized due to poor intake. Despite liberalization, intake has been minimal this admission. Lunch tray at bedside, pt reports he doesn't want to eat but did ask for water. Pt also reports that he used to drink 2 ensures each day, agreeable to receiving, but does not want strawberry. 11.2% weight loss noted in the last 3 months, which is severe. Noted that pt is current pending dc to a SNF with hospice to follow outpatient.     Average Meal Intake:  4/12-4/20: 21% average x 10 recorded meals  Relevant Scheduled Meds: . pantoprazole  40 mg Oral Daily  . senna-docusate  2 tablet Oral Daily   Relevant PRN Meds: ondansetron  Labs reviewed, none drawn since 4/18  NUTRITION - FOCUSED PHYSICAL EXAM: Flowsheet Row  Most Recent Value  Orbital Region Severe depletion  Upper Arm Region Mild depletion  Thoracic and Lumbar Region Moderate depletion  Buccal Region Severe depletion  Temple Region Severe depletion  Clavicle Bone Region Moderate depletion  Clavicle and Acromion Bone Region Severe depletion  Scapular Bone Region Severe depletion  Dorsal Hand Unable to assess  [mittens in place, pt pulling at lines]  Patellar Region Severe depletion  Anterior Thigh Region Severe depletion  Posterior Calf Region Severe depletion  Edema (RD Assessment) None  Hair Reviewed  Eyes Reviewed  Mouth Reviewed  Skin Reviewed  Nails Reviewed     Diet Order:   Diet Order            Diet regular Room service appropriate? Yes; Fluid consistency: Thin  Diet effective now                EDUCATION NEEDS:  No education needs have been identified at this time  Skin:  Skin Assessment: Skin Integrity Issues: Skin Integrity Issues:: Stage II Stage II: left buttocks  Last BM:  4/20 per RN documentation, type 5  Height:  Ht Readings from Last 1 Encounters:  01/13/21 5\' 8"  (1.727 m)    Weight:  Wt Readings from Last 1 Encounters:  01/15/21 75.3 kg    Ideal Body Weight:  70 kg  BMI:  Body mass index is 25.24 kg/m.  Estimated Nutritional Needs:   Kcal:  1800-2000 kcal  Protein:  90-100 g  Fluid:  >2L/d   Ranell Patrick, RD, LDN Clinical Dietitian Pager on Amion

## 2021-01-20 NOTE — TOC Progression Note (Signed)
Transition of Care (TOC) - Progression Note    Patient Details  Name: Lonnie [name]. MRN: [id number] Date of Birth: 1927-09-20  Transition of Care [facility]) CM/SW Contact  Shelbie Hutching, RN Phone Number: 01/20/2021, 9:00 AM  Clinical Narrative:    Patient's daughter is interested in the hospice unit at the [facility].  RNCM reached out to the [facility] and left a message with the palliative care team for hospice referral.    Expected Discharge Plan: [place] Barriers to Discharge: Insurance Authorization  Expected Discharge Plan and Services Expected Discharge Plan: [place]     Post Acute Care Choice:  (TBD) Living arrangements for the past 2 months: Skilled Nursing Facility                                       Social Determinants of Health (SDOH) Interventions    Readmission Risk Interventions Readmission Risk Prevention Plan 12/18/2020  Transportation Screening Complete  PCP or Specialist Appt within 3-5 Days Complete  HRI or Home Care Consult Complete  Social Work Consult for [place] Planning/Counseling Complete  Palliative Care Screening Not Applicable  Medication Review Press photographer) Complete  Some recent data might be hidden

## 2021-01-20 NOTE — TOC Progression Note (Signed)
Transition of Care (TOC) - Progression Note    Patient Details  Name: Lonnie [name]. MRN: [id number] Date of Birth: Mar 31, 1927  Transition of Care [facility]) CM/SW Contact  Shelbie Hutching, RN Phone Number: 01/20/2021, 5:08 PM  Clinical Narrative:    RNCM has heard back from the [place] and they need a GEC form completed and an attestation that patient has 6 months or less life expectancy.  Form completed and attestation and clinicals faxed to the [place].  Patient's daughter updated that we are still waiting on [place] approval.  [facility] also updated on progress.    Expected Discharge Plan: Skilled Nursing Facility Barriers to Discharge: Insurance Authorization  Expected Discharge Plan and Services Expected Discharge Plan: [place]     Post Acute Care Choice:  (TBD) Living arrangements for the past 2 months: [place]                                       Social Determinants of Health (SDOH) Interventions    Readmission Risk Interventions Readmission Risk Prevention Plan 12/18/2020  Transportation Screening Complete  PCP or Specialist Appt within 3-5 Days Complete  HRI or [place] Complete  Social Work Consult for [place] Planning/Counseling Complete  Palliative Care Screening Not Applicable  Medication Review Press photographer) Complete  Some recent data might be hidden

## 2021-01-21 DIAGNOSIS — E860 Dehydration: Secondary | ICD-10-CM

## 2021-01-21 DIAGNOSIS — N390 Urinary tract infection, site not specified: Secondary | ICD-10-CM | POA: Diagnosis not present

## 2021-01-21 DIAGNOSIS — G9341 Metabolic encephalopathy: Secondary | ICD-10-CM | POA: Diagnosis not present

## 2021-01-21 DIAGNOSIS — Z515 Encounter for palliative care: Secondary | ICD-10-CM

## 2021-01-21 DIAGNOSIS — E43 Unspecified severe protein-calorie malnutrition: Secondary | ICD-10-CM | POA: Insufficient documentation

## 2021-01-21 DIAGNOSIS — Z66 Do not resuscitate: Secondary | ICD-10-CM

## 2021-01-21 DIAGNOSIS — Z7189 Other specified counseling: Secondary | ICD-10-CM | POA: Diagnosis not present

## 2021-01-21 MED ORDER — HALOPERIDOL LACTATE 5 MG/ML IJ SOLN
2.0000 mg | Freq: Once | INTRAMUSCULAR | Status: AC
  Administered 2021-01-21: 17:00:00 2 mg via INTRAMUSCULAR

## 2021-01-21 MED ORDER — HALOPERIDOL LACTATE 5 MG/ML IJ SOLN: 5.0000 mg | Freq: Four times a day (QID) | INTRAMUSCULAR | Status: DC | PRN

## 2021-01-21 MED ORDER — HALOPERIDOL LACTATE 5 MG/ML IJ SOLN: 2.0000 mg | INTRAMUSCULAR | Status: DC | PRN

## 2021-01-21 MED ORDER — QUETIAPINE FUMARATE 25 MG PO TABS
25.0000 mg | ORAL_TABLET | Freq: Every day | ORAL | Status: DC
  Administered 2021-01-21: 21:00:00 25 mg via ORAL
  Filled 2021-01-21: qty 1

## 2021-01-21 MED ORDER — HALOPERIDOL LACTATE 5 MG/ML IJ SOLN
5.0000 mg | Freq: Once | INTRAMUSCULAR | Status: DC
  Filled 2021-01-21: qty 1

## 2021-01-21 MED ORDER — HALOPERIDOL LACTATE 5 MG/ML IJ SOLN
2.0000 mg | Freq: Four times a day (QID) | INTRAMUSCULAR | Status: DC | PRN
  Administered 2021-01-21: 17:00:00 2 mg via INTRAMUSCULAR
  Filled 2021-01-21: qty 1

## 2021-01-21 NOTE — Consult Note (Signed)
[place] Psychiatry Consult   Reason for Consult: Follow-up consult 87 year old man about whom there was concern because of reports of suicidal statements Referring Physician: Elgergawy Patient Identification: Lonnie [name]. MRN:  [id number] Principal Diagnosis: Acute metabolic encephalopathy Diagnosis:  Principal Problem:   Acute metabolic encephalopathy Active Problems:   Pneumonia   Acute lower UTI   Acute kidney injury superimposed on CKD (HCC)   Constipation   AMS (altered mental status)   Hyperkalemia   Hyponatremia   Metabolic acidosis, normal anion gap (NAG)   Acute respiratory failure (HCC)   Pressure injury of skin   Protein-calorie malnutrition, severe   Total Time spent with patient: 30 minutes  Subjective:   Lonnie [name]. is a 87 y.o. male patient admitted with "you work for me".  HPI: Patient seen.  Yesterday I saw him in his room and then spoke with his daughter afterwards.  Patient is pretty debilitated with cognitive problems sluggishness probably at this point coming close to consideration for hospice.  Yesterday he had denied suicidal thoughts when I saw him.  Today he once again denies that and appears that he does not even know what I am talking about.  He is asking me if I can help him to get out of the hospital.  I explained that it was not really my role but that people were working on it.  Patient mostly laying still little activity.  Seems a bit confused but not unhappy  Past Psychiatric History: No real past psychiatric history  Risk to Self:   Risk to Others:   Prior Inpatient Therapy:   Prior Outpatient Therapy:    Past Medical History:  Past Medical History:  Diagnosis Date  . Anemia   . Basal cell carcinoma 11/13/2009   Right alar groove  . Basal cell carcinoma 04/28/2010   left preauricular, BCC with focal sclerosis. Exc. 06/04/2010  . Basal cell carcinoma 04/24/2013   right post auricular  . Basal cell carcinoma 10/26/2017    right inferior cheek above mandible, Superficial  . Basal cell carcinoma 03/22/2018   right post. base of neck  . Basal cell carcinoma 05/08/2018   post. neck, spinal, Superficial. EDC 05/08/2018  . Basal cell carcinoma 07/23/2018   left mid supraclavicular, Superficial and nodular pattern. EDC  . Basal cell carcinoma 07/23/2018   right superior chest parasternal. Superficial and nodular pattern. EDC  . Basal cell carcinoma 05/16/2019   left lateral neck. Superficial and nodular pattern. EDC  . Basal cell carcinoma 11/07/2019   left lateral forehead. Nodular. EDC  . Basal cell carcinoma 12/23/2019   Left ala nasi. BCC with sclerosis.  . Basal cell carcinoma 11/19/2020   L ear post aspect,excised 12/01/20  . Bundle branch block, left   . CKD (chronic kidney disease) stage 3, GFR 30-59 ml/min (HCC)   . COPD (chronic obstructive pulmonary disease) ([place])   . Coronary artery disease   . Family history of adverse reaction to anesthesia    DAUGHTER-N/V  . GERD (gastroesophageal reflux disease)   . History of asbestosis   . HLD (hyperlipidemia)   . Hypertension   . Pneumonia 2017  . PSA elevation     Past Surgical History:  Procedure Laterality Date  . APPENDECTOMY    . COLONOSCOPY WITH ESOPHAGOGASTRODUODENOSCOPY (EGD)    . CYSTOSCOPY WITH INSERTION OF UROLIFT N/A 10/06/2020   Procedure: CYSTOSCOPY WITH INSERTION OF UROLIFT;  Surgeon: Abbie Sons, MD;  Location: [facility] ORS;  Service:  Urology;  Laterality: N/A;  . IR CATHETER TUBE CHANGE  01/19/2021  . RECTAL SURGERY     x3 ABSCESS   Family History:  Family History  Problem Relation Age of Onset  . Heart attack Mother   . Heart attack Father    Family Psychiatric  History: None reported Social History:  Social History   Substance and Sexual Activity  Alcohol Use Yes  . Alcohol/week: 0.0 standard drinks   Comment: OCC RUM AND COKE     Social History   Substance and Sexual Activity  Drug Use No    Social History    Socioeconomic History  . Marital status: Widowed    Spouse name: Not on file  . Number of children: Not on file  . Years of education: Not on file  . Highest education level: Not on file  Occupational History  . Not on file  Tobacco Use  . Smoking status: Former Smoker    Packs/day: 2.00    Years: 50.00    Pack years: 100.00    Types: Cigarettes    Quit date: 09/24/1989    Years since quitting: 31.3  . Smokeless tobacco: Never Used  Vaping Use  . Vaping Use: Never used  Substance and Sexual Activity  . Alcohol use: Yes    Alcohol/week: 0.0 standard drinks    Comment: OCC RUM AND COKE  . Drug use: No  . Sexual activity: Not Currently  Other Topics Concern  . Not on file  Social History Narrative  . Not on file   Social Determinants of Health   Financial Resource Strain: Not on file  Food Insecurity: Not on file  Transportation Needs: Not on file  Physical Activity: Not on file  Stress: Not on file  Social Connections: Not on file   Additional Social History:    Allergies:   Allergies  Allergen Reactions  . Sulfa Antibiotics Other (See Comments)    Not sure of reaction. It was too long ago, but he said not to give it to him.   . Sulfasalazine     Other reaction(s): Other (See Comments) Not sure of reaction. It was too long ago, but he said not to give it to him.   Marland Kitchen Penicillin G Rash    Tolerates ampicillin and amoxicillin    Labs: No results found for this or any previous visit (from the past 48 hour(s)).  Current Facility-Administered Medications  Medication Dose Route Frequency Provider Last Rate Last Admin  . alfuzosin (UROXATRAL) 24 hr tablet 10 mg  10 mg Oral Q breakfast Agbata, Tochukwu, MD   10 mg at 01/21/21 1004  . Chlorhexidine Gluconate Cloth 2 % PADS 6 each  6 each Topical Daily Nita Sells, MD   6 each at 01/21/21 1535  . enoxaparin (LOVENOX) injection 40 mg  40 mg Subcutaneous Q24H Nita Sells, MD   40 mg at 01/20/21 2204   . feeding supplement (ENSURE ENLIVE / ENSURE PLUS) liquid 237 mL  237 mL Oral TID BM Elgergawy, Silver Huguenin, MD   237 mL at 01/21/21 1535  . finasteride (PROSCAR) tablet 5 mg  5 mg Oral BH-q7a Agbata, Tochukwu, MD   5 mg at 01/21/21 1004  . haloperidol lactate (HALDOL) injection 1 mg  1 mg Intravenous QHS PRN Nita Sells, MD      . haloperidol lactate (HALDOL) injection 2 mg  2 mg Intramuscular Q6H PRN Elgergawy, Silver Huguenin, MD   2 mg at 01/21/21 1650  .  haloperidol lactate (HALDOL) injection 5 mg  5 mg Intravenous Once Elgergawy, Silver Huguenin, MD      . MEDLINE mouth rinse  15 mL Mouth Rinse BID Agbata, Tochukwu, MD   15 mL at 01/20/21 1035  . mirtazapine (REMERON SOL-TAB) disintegrating tablet 7.5 mg  7.5 mg Oral QHS Gracelin Weisberg T, MD   7.5 mg at 01/20/21 2203  . mometasone-formoterol (DULERA) 200-5 MCG/ACT inhaler 2 puff  2 puff Inhalation BID Agbata, Tochukwu, MD   2 puff at 01/21/21 1005  . nystatin (MYCOSTATIN/NYSTOP) topical powder   Topical TID Collier Bullock, MD   Given at 01/21/21 1541  . ondansetron (ZOFRAN) tablet 4 mg  4 mg Oral Q6H PRN Agbata, Tochukwu, MD       Or  . ondansetron (ZOFRAN) injection 4 mg  4 mg Intravenous Q6H PRN Agbata, Tochukwu, MD   4 mg at 01/16/21 0800  . pantoprazole (PROTONIX) EC tablet 40 mg  40 mg Oral Daily Agbata, Tochukwu, MD   40 mg at 01/21/21 1004  . QUEtiapine (SEROQUEL) tablet 25 mg  25 mg Oral QHS Elgergawy, Silver Huguenin, MD      . senna-docusate (Senokot-S) tablet 2 tablet  2 tablet Oral Daily Agbata, Tochukwu, MD   2 tablet at 01/21/21 1004    Musculoskeletal: Strength & Muscle Tone: decreased Gait & Station: unsteady Patient leans: N/A            Psychiatric Specialty Exam:  Presentation  General Appearance: No data recorded Eye Contact:No data recorded Speech:No data recorded Speech Volume:No data recorded Handedness:No data recorded  Mood and Affect  Mood:No data recorded Affect:No data recorded  Thought Process   Thought Processes:No data recorded Descriptions of Associations:No data recorded Orientation:No data recorded Thought Content:No data recorded History of Schizophrenia/Schizoaffective disorder:No data recorded Duration of Psychotic Symptoms:No data recorded Hallucinations:No data recorded Ideas of Reference:No data recorded Suicidal Thoughts:No data recorded Homicidal Thoughts:No data recorded  Sensorium  Memory:No data recorded Judgment:No data recorded Insight:No data recorded  Executive Functions  Concentration:No data recorded Attention Span:No data recorded Recall:No data recorded Fund of Knowledge:No data recorded Language:No data recorded  Psychomotor Activity  Psychomotor Activity:No data recorded  Assets  Assets:No data recorded  Sleep  Sleep:No data recorded  Physical Exam: Physical Exam Vitals and nursing note reviewed.  Constitutional:      Appearance: Normal appearance. He is ill-appearing.  HENT:     Head: Normocephalic and atraumatic.     Mouth/Throat:     Pharynx: Oropharynx is clear.  Eyes:     Pupils: Pupils are equal, round, and reactive to light.  Cardiovascular:     Rate and Rhythm: Normal rate and regular rhythm.  Pulmonary:     Effort: Pulmonary effort is normal.     Breath sounds: Normal breath sounds.  Abdominal:     General: Abdomen is flat.     Palpations: Abdomen is soft.  Musculoskeletal:        General: Normal range of motion.  Skin:    General: Skin is warm and dry.  Neurological:     General: No focal deficit present.     Mental Status: He is alert. Mental status is at baseline.  Psychiatric:        Attention and Perception: He is inattentive.        Mood and Affect: Mood normal. Affect is blunt.        Speech: Speech is delayed.        Behavior: Behavior is slowed.  Thought Content: Thought content normal. Thought content does not include suicidal ideation.        Cognition and Memory: Cognition is impaired.  Memory is impaired.        Judgment: Judgment is inappropriate.    Review of Systems  Constitutional: Negative.   HENT: Negative.   Eyes: Negative.   Respiratory: Negative.   Cardiovascular: Negative.   Gastrointestinal: Negative.   Musculoskeletal: Negative.   Skin: Negative.   Neurological: Negative.   Psychiatric/Behavioral: Negative.    Blood pressure 119/71, pulse 79, temperature (!) 97.5 F (36.4 C), temperature source Oral, resp. rate 18, height 5\' 8"  (1.727 m), weight 75.3 kg, SpO2 96 %. Body mass index is 25.24 kg/m.  Treatment Plan Summary: Plan Patient does not require suicide sitter or psychiatric hospitalization.  Started low-dose mirtazapine back last night to help with sleep appetite and possibly mood.  Treatment team working on discharge planning.  I will follow-up as needed.  Disposition: No evidence of imminent risk to self or others at present.   Patient does not meet criteria for psychiatric inpatient admission.  Alethia Berthold, MD 01/21/2021 5:09 PM

## 2021-01-21 NOTE — Progress Notes (Signed)
PROGRESS NOTE   Lonnie [name].  Lonnie [name]:[id number] DOB: 04/28/1927 DOA: 01/12/2021 PCP: Derinda Late, MD   Brief Narrative:   18 Caucasian male [place] resident, with past medical history of hypertension, gout, basal cell carcinoma s/p excision 12/01/2020,Recent hospitalization recent hospitalization 3/16-3/24  significant foracute cholecystitis status post percutaneous cholecystostomy and prolonged ileus NG tube placed-also had wide-complex tachycardia , ERI, and urinary retention requiring Foley catheter insertion Brought into [facility] ED toxic metabolic encephalopathy-O2 sat 80%, thought to be secondary to infection from UTI and aspiration pneumonia. Overall patient is very weak, frail and deconditioned, with significant encephalopathy, with very poor oral intake, has been seen by palliative medicine, he is currently DNR, with goal for placement is facility with hospice.  Subjective:  Remains confused, answering some questions, he denies any complaints, has any suicidal thoughts or ideations.  Hospital-Problem based course  Toxic metabolic encephalopathy on admission -Most likely in the setting of infectious process including cystitis/aspiration pneumonia, immobility and deconditioning with multiple hospitalization over the last several month .  Aspiration with hypoxic respiratory failure on admissio - SLP suspects esophageal dysphagia 2 diet -diet liberalized as he has poor appetite and  not eating - cont aspiration precautions-no further diagnostics -Treated with IV antibiotics  UTI -Treated with antibiotics  Perc cholecystotomy followed by general surgeon Dr. Peyton Najjar -Supposed to have drain study versus further therapeutics 4/20, We will cancel interventional radiology appointments going forward he will keep indwelling cholecystotomy drain at this time given trajectory of care  AKI superimposed on CKD 3--2/2 Bladder outlet obstruction -With evidence of  retention  Hyperkalemia/metabolic acidosis secondary to sepsis probably from pneumonia -Lokelma given x1 with good effect - No further labs  Basal cell CA  Gout-adjust meds according to renal function per pharmacy  Neuropathy-discontinue completely gabapentin 4/17 given encephalopathy  Failure to thrive  Depression/suicidal thoughts or ideation -Patient does not express suicidal thoughts or ideations yesterday, for which she was kept in suicidal precaution, psychiatric input greatly appreciated, patient was started on mirtazapine, patient denies any suicidal thoughts or ideations today, discussed with Dr. Weber Cooks today, who reports at this point no need to continue suicide precaution.  Goals of care -Patient is extremely frail, deconditioned, with significant failure to thrive, with multiple comorbidities, patient is currently DNR, plan is to discharge to hospice facility(awaiting insurance authorization)   DVT prophylaxis: Lovenox Code Status: DNR--hospice approprate Family Communication: D/W daughter [name] by phone. Disposition:  Status is: Inpatient  Remains inpatient appropriate because:Hemodynamically unstable, Ongoing diagnostic testing needed not appropriate for outpatient work up, Unsafe d/c plan and IV treatments appropriate due to intensity of illness or inability to take PO   Dispo: The patient is from: SNF              Anticipated d/c is to: SNF with hospice              Patient currently is not medically stable to d/c.  Appropriate for discharge to facility with hospice, awaiting bed availability.   Difficult to place patient No    Consultants:   Palliative  IR   psychiatry  Procedures: multiple  Antimicrobials: As above     Objective: Vitals:   01/20/21 2201 01/21/21 0004 01/21/21 0837 01/21/21 1144  BP: (!) 141/70 139/62 (!) 147/71 119/71  Pulse: 78 81 70 79  Resp: 18  18 18   Temp: 98.1 F (36.7 C) 98.2 F (36.8 C) (!) 97.5 F (36.4 C)    TempSrc: Oral  Oral  SpO2: 99% 97% 99% 96%  Weight:      Height:        Intake/Output Summary (Last 24 hours) at 01/21/2021 1156 Last data filed at 01/21/2021 6010 Gross per 24 hour  Intake 0 ml  Output 975 ml  Net -975 ml   Filed Weights   01/12/21 1442 01/13/21 0501 01/15/21 0406  Weight: 84.8 kg 74.9 kg 75.3 kg    Examination:  Awake, confused, denies any complaints today, denies any suicidal thoughts or ideations.   Clear to auscultation bilaterally  regular rate and rhythm  Abdomen soft, nontender, bowel sounds present  EXT  with no edema, clubbing or cyanosis   Data Reviewed: personally reviewed    Radiology Studies: IR Catheter Tube Change  Result Date: 01/19/2021 INDICATION: Status post percutaneous cholecystostomy tube placement to treat acute cholecystitis on 12/09/2020. There is evidence of possible tube retraction at the bedside during current hospitalization. EXAM: CHOLANGIOGRAM THROUGH PRE-EXISTING CHOLECYSTOSTOMY TUBE UNDER FLUOROSCOPY MEDICATIONS: None ANESTHESIA/SEDATION: None FLUOROSCOPY TIME:  Fluoroscopy Time: 30 seconds.  3.7 mGy. CONTRAST:  10 mL Visipaque [id number] COMPLICATIONS: None immediate. PROCEDURE: Informed written consent was obtained from the patient's daughter after a thorough discussion of the procedural risks, benefits and alternatives. All questions were addressed. Maximal Sterile Barrier Technique was utilized including caps, mask, sterile gowns, sterile gloves, sterile drape, hand hygiene and skin antiseptic. A timeout was performed prior to the initiation of the procedure. The pre-existing cholecystostomy tube was injected with contrast material and multiple fluoroscopic imaging loops were saved. The tube was then reconnected to a new gravity drainage bag. Additional 0 Prolene retention suture was applied at the catheter exit site. FINDINGS: The cholecystostomy tube is well position within the gallbladder lumen and does not appear to have retracted  significantly. The tube is normally patent with contrast injection demonstrating a decompressed gallbladder, flow via a patent cystic duct and opacification of the common bile duct. The distal portion of the common bile duct shows mild dilatation and irregularity without discrete filling defect identified. There is eventual visualization of contrast entering the duodenum. IMPRESSION: Appropriate positioning of cholecystostomy tube. The tube did not require exchange or replacement. An additional retention suture was applied at the catheter exit site. Cholangiogram demonstrates a decompressed gallbladder, patent cystic duct and patent common bile duct with mild dilatation and irregularity of the distal common bile duct. No discrete calculi identified in the bile ducts. Electronically Signed   By: Aletta Edouard M.D.   On: 01/19/2021 14:12     Scheduled Meds: . alfuzosin  10 mg Oral Q breakfast  . Chlorhexidine Gluconate Cloth  6 each Topical Daily  . enoxaparin (LOVENOX) injection  40 mg Subcutaneous Q24H  . feeding supplement  237 mL Oral TID BM  . finasteride  5 mg Oral BH-q7a  . mouth rinse  15 mL Mouth Rinse BID  . mirtazapine  7.5 mg Oral QHS  . mometasone-formoterol  2 puff Inhalation BID  . nystatin   Topical TID  . pantoprazole  40 mg Oral Daily  . senna-docusate  2 tablet Oral Daily   Continuous Infusions:    LOS: 9 days    Phillips Climes, MD [facility] To contact the attending provider between 7A-7P or the covering provider during after hours 7P-7A, please log into the web site [email or web address] and access using universal [place] password for that web site. If you do not have the password, please call the hospital operator.  01/21/2021, 11:56 AM

## 2021-01-21 NOTE — Progress Notes (Addendum)
[place] [facility] [place] [facility]) Hospital Liaison RN note:  Received request from Quinn Axe, NP for hospice services at [facility] after discharge. Chart and patient information under review by [facility] physician. Hospice eligibility was approved.  Please send signed and completed DNR with patient. Please provide prescriptions at discharge as needed to ensure ongoing symptom management.  Please call with any hospice related questions or concerns.  Thank you for the opportunity to participate in this patient's care.  Zandra Abts, RN  [facility] Liaison [phone]

## 2021-01-21 NOTE — TOC Progression Note (Signed)
Transition of Care (TOC) - Progression Note    Patient Details  Name: Lonnie [name]. MRN: [id number] Date of Birth: 06-May-1927  Transition of Care [facility]) CM/SW Contact  Shelbie Hutching, RN Phone Number: 01/21/2021, 3:32 PM  Clinical Narrative:    [facility] has authorization from the [place]- patient will discharge tomorrow to facility with [facility].  Penny with [facility] is aware of DC tomorrow.    Expected Discharge Plan: Skilled Nursing Facility Barriers to Discharge: Insurance Authorization  Expected Discharge Plan and Services Expected Discharge Plan: [place]     Post Acute Care Choice:  (TBD) Living arrangements for the past 2 months: [place]                                       Social Determinants of Health (SDOH) Interventions    Readmission Risk Interventions Readmission Risk Prevention Plan 12/18/2020  Transportation Screening Complete  PCP or Specialist Appt within 3-5 Days Complete  HRI or [place] Complete  Social Work Consult for [place] Planning/Counseling Complete  Palliative Care Screening Not Applicable  Medication Review Press photographer) Complete  Some recent data might be hidden

## 2021-01-21 NOTE — TOC Progression Note (Signed)
Transition of Care (TOC) - Progression Note    Patient Details  Name: Sherrod Toothman. MRN: [id number] Date of Birth: 12-30-1926  Transition of Care [facility]) CM/SW Contact  Shelbie Hutching, RN Phone Number: 01/21/2021, 2:20 PM  Clinical Narrative:    [facility] reports that we should have final approval for patient to go to [facility] with [facility] by this afternoon.  Plan for discharge to [facility] tomorrow.     Expected Discharge Plan: Skilled Nursing Facility Barriers to Discharge: Insurance Authorization  Expected Discharge Plan and Services Expected Discharge Plan: [place]     Post Acute Care Choice:  (TBD) Living arrangements for the past 2 months: [place]                                       Social Determinants of Health (SDOH) Interventions    Readmission Risk Interventions Readmission Risk Prevention Plan 12/18/2020  Transportation Screening Complete  PCP or Specialist Appt within 3-5 Days Complete  HRI or [place] Complete  Social Work Consult for [place] Planning/Counseling Complete  Palliative Care Screening Not Applicable  Medication Review Press photographer) Complete  Some recent data might be hidden

## 2021-01-21 NOTE — Progress Notes (Signed)
   Daily Progress Note   Patient Name: Lonnie [name].       Date: 01/21/2021 DOB: 12-25-1926  Age: 87 y.o. MRN#: [id number] Attending Physician: Albertine Patricia, MD Primary Care Physician: Derinda Late, MD Admit Date: 01/12/2021  Reason for Consultation/Follow-up: Establishing goals of care  Subjective: Chart Reviewed. Updates Received.   Patient is resting. Remains confused, will answer some questions with eyes remaining closed. Sitter at the bedside. Lunch tray at the bedside untouched. Appetite remains poor. Frail, deconditioned, with poor prognosis.   Per updates plans are for patient to discharge with outpatient hospice support. [facility] is currently following patient.   Length of Stay: 9 days  Vital Signs: BP 119/71 (BP Location: Left Arm)   Pulse 79   Temp (!) 97.5 F (36.4 C) (Oral)   Resp 18   Ht 5\' 8"  (1.727 m)   Wt 75.3 kg   SpO2 96%   BMI 25.24 kg/m  SpO2: SpO2: 96 % O2 Device: O2 Device: Room Air O2 Flow Rate: O2 Flow Rate (L/min): 2 L/min  Physical Exam: Somnolent, frail, ill appearing RRR Diminished bilaterally Confused, will not follow commands, opens eyes intermittently           Palliative Care Assessment & Plan   Code Status: DNR  Goals of Care/Recommendations: DNR/DNI Care to focus on comfort with pending discharge and outpatient hospice support ([facility] following) PMT will continue to support and follow as needed. Please call with urgent needs.   Prognosis: < 6 months   Discharge Planning: To Be Determined with hospice   Thank you for allowing the Palliative Medicine Team to assist in the care of this patient.  Time Total: 20 min.   Visit consisted of counseling and education dealing with the complex and emotionally intense issues of symptom management and palliative care in the setting of serious and potentially life-threatening illness.Greater than 50%  of this time was spent counseling and coordinating care related to the  above assessment and plan.  Alda Lea, AGPCNP-BC  Palliative Medicine Team [phone]

## 2021-01-22 DIAGNOSIS — R4182 Altered mental status, unspecified: Secondary | ICD-10-CM

## 2021-01-22 DIAGNOSIS — Z515 Encounter for palliative care: Secondary | ICD-10-CM

## 2021-01-22 LAB — RESP PANEL BY RT-PCR (FLU A&B, COVID) ARPGX2
Influenza A by PCR: NEGATIVE
Influenza B by PCR: NEGATIVE
SARS Coronavirus 2 by RT PCR: NEGATIVE

## 2021-01-22 LAB — SARS CORONAVIRUS 2 (TAT 6-24 HRS): SARS Coronavirus 2: NEGATIVE

## 2021-01-22 MED ORDER — HALOPERIDOL 1 MG PO TABS: 1.0000 mg | ORAL_TABLET | Freq: Four times a day (QID) | ORAL | 2 refills | Status: AC | PRN

## 2021-01-22 MED ORDER — ENSURE ENLIVE PO LIQD: 237.0000 mL | Freq: Three times a day (TID) | ORAL | 12 refills | Status: AC

## 2021-01-22 MED ORDER — MIRTAZAPINE 15 MG PO TBDP: 15.0000 mg | ORAL_TABLET | Freq: Every day | ORAL | Status: AC

## 2021-01-22 MED ORDER — QUETIAPINE FUMARATE 25 MG PO TABS: 25.0000 mg | ORAL_TABLET | Freq: Every day | ORAL | Status: AC

## 2021-01-22 MED ORDER — MORPHINE SULFATE 20 MG/5ML PO SOLN: 2.5000 mg | ORAL | 0 refills | Status: AC | PRN

## 2021-01-22 MED ORDER — ONDANSETRON HCL 4 MG PO TABS: 4.0000 mg | ORAL_TABLET | Freq: Four times a day (QID) | ORAL | 0 refills | Status: AC | PRN

## 2021-01-22 NOTE — Progress Notes (Signed)
Report given to [facility] RN at [facility].  Pt to transfer to facility for hospice care

## 2021-01-22 NOTE — Discharge Summary (Signed)
Physician Discharge Summary  Lonnie [name]. PW:[id number] DOB: December 12, 1926 DOA: 01/12/2021  PCP: Derinda Late, MD  Admit date: 01/12/2021 Discharge date: 01/22/2021  Admitted From: Home Disposition:  facility [name] under Hospice care  Recommendations for Outpatient Follow-up:  -Patient is discharged to SNF facility under hospice care, overall very poor prognosis, patient is DNR, MOST form has been filled, patient will be discharged on as needed morphine, haloperidol. goals of care are addressed in the MOST form, no fluids, no antibiotics, no rehospitalization's, and to proceed with full comfort measures if he is to deteriorate.  Home Health:NO Equipment/Devices:SNF  Discharge Condition:hospice CODE STATUS: DNR Diet recommendation:  Regular /feeding for comfort  Brief/Interim Summary:  41 Caucasian male [name] resident, with past medical history of hypertension, gout, basal cell carcinoma s/p excision 12/01/2020,Recent hospitalization recent hospitalization 3/16-3/24  significantforacute cholecystitis status post percutaneous cholecystostomy and prolonged ileus NG tube placed-also had wide-complex tachycardia , ERI, and urinary retention requiring Foley catheter insertion Brought into [facility] ED toxic metabolic encephalopathy-O2 sat 80%, thought to be secondary to infection from UTI and aspiration pneumonia. Overall patient is very weak, frail and deconditioned, with significant encephalopathy, with very poor oral intake, has been seen by palliative medicine, he is currently DNR, with goal for placement is facility with hospice.  Toxic/ metabolic encephalopathy on admission -Most likely in the setting of infectious process including cystitis/aspiration pneumonia, immobility and deconditioning with multiple hospitalization over the last several month . -As well patient with some hospital delirium, this has improved with Seroquel, he is resumed on his mirtazapine as well which helps  with night sleep, and he well on as needed Haldol as well.  Aspiration with hypoxic respiratory failure on admissio - SLP suspects esophageal region -diet liberalized as he has poor appetite and  not eating - cont aspiration precautions-no further diagnostics -Treated with IV antibiotics  UTI -Treated with antibiotics  Perc cholecystotomy followed by general surgeon Dr. Peyton Najjar -Supposed to have drain study versus further therapeutics 4/20, but giving overall very poor status and deconditioning, and trajectory of care going towards hospice, end-of-life, further interventional radiology work-up has been canceled , and plan is to continue with current drain, with regular drain care.     AKI superimposed on CKD 3--2/2 Bladder outlet obstruction -With evidence of retention, required Foley catheter insertion, will continue Foley catheter on discharge given his mental status, and he is high risk for deterioration, and possible need for comfort measure, where it is going to be difficult to monitor for retention . -continue with Proscar  Hyperkalemia/metabolic acidosis secondary to sepsis probably from pneumonia -Lokelma given x1 with good effect - No further labs  Basal cell CA  Neuropathy  Depression/suicidal thoughts or ideation -Patient is encephalopathic, he did report he was Seidel thoughts or ideation, seen by psych, no indication for suicide precaution or inpatient psych hospitalization  Failure to thrive/goals of care -Patient is extremely frail, deconditioned, with significant failure to thrive, with multiple comorbidities, with very poor oral intake, patient is currently DNR, plan is to discharge to continue with hospice care. -Overall patient's status is very tenuous, he is expected to continue to deteriorate, he has less than 6 months to live, and I would anticipate much sooner than that, he will be discharged to the facility with comfort medications include as needed  morphine, as well as needed Haldol agitation or restlessness.MOST form was filled by palliative prior to discharge, no rehospitalization's, no IV fluids, no IV antibiotics, if deteriorates then  focus is on  comfort.   Discharge Diagnoses:  Principal Problem:   Acute metabolic encephalopathy Active Problems:   Pneumonia   Acute lower UTI   Acute kidney injury superimposed on CKD (HCC)   Constipation   AMS (altered mental status)   Hyperkalemia   Hyponatremia   Metabolic acidosis, normal anion gap (NAG)   Acute respiratory failure (HCC)   Pressure injury of skin   Protein-calorie malnutrition, severe   Hospice care patient    Discharge Instructions  Discharge Instructions    Diet - low sodium heart healthy   Complete by: As directed    Discharge wound care:   Complete by: As directed    No dressing changes, but flush percutaneous cholecystostomy with NS 5 cc twice daily.     Allergies as of 01/22/2021      Reactions   Sulfa Antibiotics Other (See Comments)   Not sure of reaction. It was too long ago, but he said not to give it to him.    Sulfasalazine    Other reaction(s): Other (See Comments) Not sure of reaction. It was too long ago, but he said not to give it to him.    Penicillin G Rash   Tolerates ampicillin and amoxicillin      Medication List    STOP taking these medications   allopurinol 300 MG tablet Commonly known as: ZYLOPRIM   aspirin 81 MG tablet   melatonin 5 MG Tabs   NIFEdipine 30 MG 24 hr tablet Commonly known as: PROCARDIA-XL/NIFEDICAL-XL   vitamin C 250 MG tablet Commonly known as: ASCORBIC ACID     TAKE these medications   alfuzosin 10 MG 24 hr tablet Commonly known as: UROXATRAL Take 10 mg by mouth daily with breakfast.   bisacodyl 5 MG EC tablet Commonly known as: DULCOLAX Take 10 mg by mouth 2 (two) times daily as needed.   budesonide-formoterol 160-4.5 MCG/ACT inhaler Commonly known as: SYMBICORT Inhale 2 puffs into the  lungs 2 (two) times daily.   Combivent Respimat 20-100 MCG/ACT Aers respimat Generic drug: Ipratropium-Albuterol Inhale 1 puff into the lungs every morning.   feeding supplement Liqd Take 237 mLs by mouth 3 (three) times daily between meals.   ferrous sulfate 325 (65 FE) MG tablet Take 325 mg by mouth 2 (two) times daily with a meal.   finasteride 5 MG tablet Commonly known as: PROSCAR Take 5 mg by mouth every morning.   gabapentin 100 MG capsule Commonly known as: NEURONTIN Take 1 capsule (100 mg total) by mouth 2 (two) times daily.   haloperidol 1 MG tablet Commonly known as: HALDOL Take 1 tablet (1 mg total) by mouth every 6 (six) hours as needed for agitation.   Magnesium Oxide 250 MG Tabs Take 500 mg by mouth every morning.   mirtazapine 15 MG disintegrating tablet Commonly known as: REMERON SOL-TAB Take 1 tablet (15 mg total) by mouth at bedtime.   morphine 20 MG/5ML solution Take 0.6 mLs (2.4 mg total) by mouth every 4 (four) hours as needed for pain (or dyspnea).   nystatin powder Commonly known as: MYCOSTATIN/NYSTOP Apply topically 3 (three) times daily.   omeprazole 20 MG capsule Commonly known as: PRILOSEC Take 20 mg by mouth 2 (two) times daily.   ondansetron 4 MG tablet Commonly known as: ZOFRAN Take 1 tablet (4 mg total) by mouth every 6 (six) hours as needed for nausea.   QUEtiapine 25 MG tablet Commonly known as: SEROQUEL Take 1 tablet (25 mg  total) by mouth at bedtime.   sennosides-docusate sodium 8.6-50 MG tablet Commonly known as: SENOKOT-S Take 2 tablets by mouth daily.   vitamin B-12 1000 MCG tablet Commonly known as: CYANOCOBALAMIN Take 1,000 mcg by mouth daily.            Discharge Care Instructions  (From admission, onward)         Start     Ordered   01/22/21 0000  Discharge wound care:       Comments: No dressing changes, but flush percutaneous cholecystostomy with NS 5 cc twice daily.   01/22/21 1120           Contact information for after-discharge care    Destination    [facility] .   Service: Skilled Nursing Contact information: [address] [place] [phone]                 Allergies  Allergen Reactions  . Sulfa Antibiotics Other (See Comments)    Not sure of reaction. It was too long ago, but he said not to give it to him.   . Sulfasalazine     Other reaction(s): Other (See Comments) Not sure of reaction. It was too long ago, but he said not to give it to him.   Marland Kitchen Penicillin G Rash    Tolerates ampicillin and amoxicillin    Consultations:  Psychiatry  palliative   Procedures/Studies: CT ABDOMEN PELVIS WO CONTRAST  Result Date: 01/12/2021 CLINICAL DATA:  Nonlocalized acute abdominal pain. Percutaneous tube in place for cholecystitis. EXAM: CT ABDOMEN AND PELVIS WITHOUT CONTRAST TECHNIQUE: Multidetector CT imaging of the abdomen and pelvis was performed following the standard protocol without IV contrast. COMPARISON:  Plain film of 12/22/2020.  Most recent CT 12/16/2020. FINDINGS: Lower chest: Worsened right greater than left base airspace disease with mild bibasilar bronchiectasis. Normal heart size with multivessel coronary artery atherosclerosis. Bilateral calcified pleural plaques. Hepatobiliary: Normal noncontrast appearance of the liver. The gallbladder is decompressed by cholecystostomy tube. Tiny gallstone or stones within the neck. No evidence of acute cholecystitis or procedure related complication. No biliary duct dilatation. Pancreas: Normal pancreas for age, without mass or acute inflammation. Spleen: Normal in size, without focal abnormality. Adrenals/Urinary Tract: Normal adrenal glands. Bilateral renal vascular calcification. Suspect concurrent bilateral punctate collecting system calculi. Expected renal cortical thinning for age. No hydroureter or ureteric calculi. No bladder calculi. Stomach/Bowel:  Normal stomach, without wall thickening. Rectal stool ball of 7.2 cm. Large colonic stool burden more proximally. Extensive colonic diverticulosis. Normal terminal ileum. Normal small bowel. Vascular/Lymphatic: Aortic atherosclerosis. No abdominopelvic adenopathy. Reproductive: Radiation seeds in the prostate. Other: No significant free fluid. Small bilateral fat containing inguinal hernias. No free intraperitoneal air. Musculoskeletal: No acute osseous abnormality. IMPRESSION: 1. Percutaneous cholecystostomy tube in place, without acute complication. 2. Probable constipation and fecal impaction. 3. Worsened bibasilar airspace disease, suspicious for infection or aspiration, superimposed upon chronic scarring. 4. Asbestos related pleural disease. 5. Renal vascular calcification with probable bilateral nephrolithiasis. 6. Coronary artery atherosclerosis. 7. Aortic Atherosclerosis (ICD10-I70.0). Electronically Signed   By: Abigail Miyamoto M.D.   On: 01/12/2021 17:51   CT Head Wo Contrast  Result Date: 01/12/2021 CLINICAL DATA:  Altered level of consciousness, lethargy, hypoxia EXAM: CT HEAD WITHOUT CONTRAST TECHNIQUE: Contiguous axial images were obtained from the base of the skull through the vertex without intravenous contrast. COMPARISON:  01/17/2016 FINDINGS: Brain: Hypodensities within the periventricular white matter are consistent with chronic small vessel  ischemic changes, and are stable. No evidence of acute infarct or hemorrhage. The lateral ventricles and midline structures are unremarkable. No acute extra-axial fluid collections. No mass effect. Vascular: No hyperdense vessel or unexpected calcification. Skull: Normal. Negative for fracture or focal lesion. Sinuses/Orbits: No acute finding. Other: None. IMPRESSION: 1. No acute intracranial process. Stable chronic small vessel ischemic changes within the white matter. Electronically Signed   By: Randa Ngo M.D.   On: 01/12/2021 17:45   IR Catheter  Tube Change  Result Date: 01/19/2021 INDICATION: Status post percutaneous cholecystostomy tube placement to treat acute cholecystitis on 12/09/2020. There is evidence of possible tube retraction at the bedside during current hospitalization. EXAM: CHOLANGIOGRAM THROUGH PRE-EXISTING CHOLECYSTOSTOMY TUBE UNDER FLUOROSCOPY MEDICATIONS: None ANESTHESIA/SEDATION: None FLUOROSCOPY TIME:  Fluoroscopy Time: 30 seconds.  3.7 mGy. CONTRAST:  10 mL Visipaque [id number] COMPLICATIONS: None immediate. PROCEDURE: Informed written consent was obtained from the patient's daughter after a thorough discussion of the procedural risks, benefits and alternatives. All questions were addressed. Maximal Sterile Barrier Technique was utilized including caps, mask, sterile gowns, sterile gloves, sterile drape, hand hygiene and skin antiseptic. A timeout was performed prior to the initiation of the procedure. The pre-existing cholecystostomy tube was injected with contrast material and multiple fluoroscopic imaging loops were saved. The tube was then reconnected to a new gravity drainage bag. Additional 0 Prolene retention suture was applied at the catheter exit site. FINDINGS: The cholecystostomy tube is well position within the gallbladder lumen and does not appear to have retracted significantly. The tube is normally patent with contrast injection demonstrating a decompressed gallbladder, flow via a patent cystic duct and opacification of the common bile duct. The distal portion of the common bile duct shows mild dilatation and irregularity without discrete filling defect identified. There is eventual visualization of contrast entering the duodenum. IMPRESSION: Appropriate positioning of cholecystostomy tube. The tube did not require exchange or replacement. An additional retention suture was applied at the catheter exit site. Cholangiogram demonstrates a decompressed gallbladder, patent cystic duct and patent common bile duct with mild  dilatation and irregularity of the distal common bile duct. No discrete calculi identified in the bile ducts. Electronically Signed   By: Aletta Edouard M.D.   On: 01/19/2021 14:12   DG Chest Portable 1 View  Result Date: 01/12/2021 CLINICAL DATA:  Hypoxia. EXAM: PORTABLE CHEST 1 VIEW COMPARISON:  12/16/2020 FINDINGS: Normal sized heart. Tortuous and calcified thoracic aorta. Previously noted bilateral calcified pleural plaques. The underlying lungs remain clear with a poor inspiration noted. Diffuse osteopenia. IMPRESSION: 1. No acute abnormality. 2. Stable bilateral calcified pleural plaques, compatible with previous asbestos exposure. Electronically Signed   By: Claudie Revering M.D.   On: 01/12/2021 15:51    (Echo, Carotid, EGD, Colonoscopy, ERCP)  Subjective:   Discharge Exam: Vitals:   01/22/21 0354 01/22/21 0753  BP: 129/67 (!) 144/64  Pulse: 73 73  Resp: 18 18  Temp: 98 F (36.7 C) (!) 97.5 F (36.4 C)  SpO2: 98% 98%   Vitals:   01/21/21 1919 01/22/21 0004 01/22/21 0354 01/22/21 0753  BP: (!) 142/67 139/62 129/67 (!) 144/64  Pulse: 83 70 73 73  Resp: 18 16 18 18   Temp: 98.5 F (36.9 C) 98.1 F (36.7 C) 98 F (36.7 C) (!) 97.5 F (36.4 C)  TempSrc:      SpO2: 99% 99% 98% 98%  Weight:      Height:        General: Patient sleeping comfortably, wakes up, confused,  unable to answer any questions appropriately .  extremly frail and deconditioned. Cardiovascular: RRR, S1/S2 +, no rubs, no gallops Respiratory: CTA bilaterally, no wheezing, no rhonchi Abdominal: Soft, NT, ND, bowel sounds +, right upper quadrant drain. Extremities: no edema, no cyanosis    The results of significant diagnostics from this hospitalization (including imaging, microbiology, ancillary and laboratory) are listed below for reference.     Microbiology: Recent Results (from the past 240 hour(s))  Urine Culture     Status: Abnormal   Collection Time: 01/12/21  4:17 PM   Specimen: Urine, Clean  Catch  Result Value Ref Range Status   Specimen Description   Final    URINE, CLEAN CATCH Performed at [facility], [address] [place], [place] [address]    Special Requests   Final    NONE Performed at [facility], [address] [place], [place] [address]    Culture (A)  Final    >=100,000 COLONIES/mL MULTIPLE SPECIES PRESENT, SUGGEST RECOLLECTION   Report Status 01/14/2021 FINAL  Final  SARS CORONAVIRUS 2 (TAT 6-24 HRS) Nasopharyngeal Nasopharyngeal Swab     Status: None   Collection Time: 01/12/21  6:17 PM   Specimen: Nasopharyngeal Swab  Result Value Ref Range Status   SARS Coronavirus 2 NEGATIVE NEGATIVE Final    Comment: (NOTE) SARS-CoV-2 target nucleic acids are NOT DETECTED.  The SARS-CoV-2 RNA is generally detectable in upper and lower respiratory specimens during the acute phase of infection. Negative results do not preclude SARS-CoV-2 infection, do not rule out co-infections with other pathogens, and should not be used as the sole basis for treatment or other patient management decisions. Negative results must be combined with clinical observations, patient history, and epidemiological information. The expected result is Negative.  Fact Sheet for Patients: SugarRoll.be  Fact Sheet for Healthcare Providers: https://www.woods-mathews.com/  This test is not yet approved or cleared by the [place] FDA and  has been authorized for detection and/or diagnosis of SARS-CoV-2 by FDA under an Emergency Use Authorization (EUA). This EUA will remain  in effect (meaning this test can be used) for the duration of the COVID-19 declaration under Se ction 564(b)(1) of the Act, 21 U.S.C. section 360bbb-3(b)(1), unless the authorization is terminated or revoked sooner.  Performed at [facility], [place] [address] [place], [place] [address]   MRSA PCR Screening     Status: None   Collection Time:  01/18/21  4:45 PM   Specimen: Nasopharyngeal  Result Value Ref Range Status   MRSA by PCR NEGATIVE NEGATIVE Final    Comment:        The GeneXpert MRSA Assay (FDA approved for NASAL specimens only), is one component of a comprehensive MRSA colonization surveillance program. It is not intended to diagnose MRSA infection nor to guide or monitor treatment for MRSA infections. Performed at [facility], [address] [place], [place] [address]   Resp Panel by RT-PCR (Flu A&B, Covid) Nasopharyngeal Swab     Status: None   Collection Time: 01/22/21  9:38 AM   Specimen: Nasopharyngeal Swab; Nasopharyngeal(NP) swabs in vial transport medium  Result Value Ref Range Status   SARS Coronavirus 2 by RT PCR NEGATIVE NEGATIVE Final    Comment: (NOTE) SARS-CoV-2 target nucleic acids are NOT DETECTED.  The SARS-CoV-2 RNA is generally detectable in upper respiratory specimens during the acute phase of infection. The lowest concentration of SARS-CoV-2 viral copies this assay can detect is 138 copies/mL. A negative result  does not preclude SARS-Cov-2 infection and should not be used as the sole basis for treatment or other patient management decisions. A negative result may occur with  improper specimen collection/handling, submission of specimen other than nasopharyngeal swab, presence of viral mutation(s) within the areas targeted by this assay, and inadequate number of viral copies(<138 copies/mL). A negative result must be combined with clinical observations, patient history, and epidemiological information. The expected result is Negative.  Fact Sheet for Patients:  EntrepreneurPulse.com.au  Fact Sheet for Healthcare Providers:  IncredibleEmployment.be  This test is no t yet approved or cleared by the [place] FDA and  has been authorized for detection and/or diagnosis of SARS-CoV-2 by FDA under an Emergency Use Authorization (EUA).  This EUA will remain  in effect (meaning this test can be used) for the duration of the COVID-19 declaration under Section 564(b)(1) of the Act, 21 U.S.C.section 360bbb-3(b)(1), unless the authorization is terminated  or revoked sooner.       Influenza A by PCR NEGATIVE NEGATIVE Final   Influenza B by PCR NEGATIVE NEGATIVE Final    Comment: (NOTE) The Xpert Xpress SARS-CoV-2/FLU/RSV plus assay is intended as an aid in the diagnosis of influenza from Nasopharyngeal swab specimens and should not be used as a sole basis for treatment. Nasal washings and aspirates are unacceptable for Xpert Xpress SARS-CoV-2/FLU/RSV testing.  Fact Sheet for Patients: EntrepreneurPulse.com.au  Fact Sheet for Healthcare Providers: IncredibleEmployment.be  This test is not yet approved or cleared by the [place] FDA and has been authorized for detection and/or diagnosis of SARS-CoV-2 by FDA under an Emergency Use Authorization (EUA). This EUA will remain in effect (meaning this test can be used) for the duration of the COVID-19 declaration under Section 564(b)(1) of the Act, 21 U.S.C. section 360bbb-3(b)(1), unless the authorization is terminated or revoked.  Performed at [facility], [address] [place], [place] [address]      Labs: BNP (last 3 results) No results for input(s): BNP in the last 8760 hours. Basic Metabolic Panel: Recent Labs  Lab 01/16/21 0538 01/17/21 1152 01/18/21 0615  NA 140 136 139  K 3.6 4.0 3.7  CL 99 100 103  CO2 30 28 25   GLUCOSE 69* 143* 91  BUN 25* 30* 29*  CREATININE 1.32* 1.28* 1.32*  CALCIUM 8.8* 9.2 9.4  MG  --   --  2.1   Liver Function Tests: Recent Labs  Lab 01/16/21 0538 01/17/21 1152  AST 24 24  ALT 27 26  ALKPHOS 78 85  BILITOT 1.1 0.6  PROT 6.2* 6.4*  ALBUMIN 2.5* 2.7*   No results for input(s): LIPASE, AMYLASE in the last 168 hours. No results for input(s): AMMONIA in the last 168  hours. CBC: Recent Labs  Lab 01/16/21 0538 01/17/21 1152  WBC 9.6 8.7  NEUTROABS 7.1 6.6  HGB 8.7* 9.4*  HCT 26.9* 28.6*  MCV 100.7* 98.3  PLT 181 196   Cardiac Enzymes: No results for input(s): CKTOTAL, CKMB, CKMBINDEX, TROPONINI in the last 168 hours. BNP: Invalid input(s): POCBNP CBG: No results for input(s): GLUCAP in the last 168 hours. D-Dimer No results for input(s): DDIMER in the last 72 hours. Hgb A1c No results for input(s): HGBA1C in the last 72 hours. Lipid Profile No results for input(s): CHOL, HDL, LDLCALC, TRIG, CHOLHDL, LDLDIRECT in the last 72 hours. Thyroid function studies No results for input(s): TSH, T4TOTAL, T3FREE, THYROIDAB in the last 72 hours.  Invalid input(s): FREET3 Anemia work up No results for  input(s): VITAMINB12, FOLATE, FERRITIN, TIBC, IRON, RETICCTPCT in the last 72 hours. Urinalysis    Component Value Date/Time   COLORURINE YELLOW (A) 01/12/2021 1448   APPEARANCEUR CLOUDY (A) 01/12/2021 1448   APPEARANCEUR Hazy (A) 09/28/2020 1313   LABSPEC 1.015 01/12/2021 1448   LABSPEC 1.017 09/29/2012 2058   PHURINE 5.0 01/12/2021 1448   GLUCOSEU NEGATIVE 01/12/2021 1448   GLUCOSEU Negative 09/29/2012 2058   HGBUR NEGATIVE 01/12/2021 1448   BILIRUBINUR NEGATIVE 01/12/2021 1448   BILIRUBINUR Negative 09/28/2020 1313   BILIRUBINUR Negative 09/29/2012 2058   KETONESUR NEGATIVE 01/12/2021 1448   PROTEINUR 30 (A) 01/12/2021 1448   NITRITE NEGATIVE 01/12/2021 1448   LEUKOCYTESUR LARGE (A) 01/12/2021 1448   LEUKOCYTESUR Negative 09/29/2012 2058   Sepsis Labs Invalid input(s): PROCALCITONIN,  WBC,  LACTICIDVEN Microbiology Recent Results (from the past 240 hour(s))  Urine Culture     Status: Abnormal   Collection Time: 01/12/21  4:17 PM   Specimen: Urine, Clean Catch  Result Value Ref Range Status   Specimen Description   Final    URINE, CLEAN CATCH Performed at [facility], [address], [place], [place] [address]    Special  Requests   Final    NONE Performed at [facility], [address] [place], [place] [address]    Culture (A)  Final    >=100,000 COLONIES/mL MULTIPLE SPECIES PRESENT, SUGGEST RECOLLECTION   Report Status 01/14/2021 FINAL  Final  SARS CORONAVIRUS 2 (TAT 6-24 HRS) Nasopharyngeal Nasopharyngeal Swab     Status: None   Collection Time: 01/12/21  6:17 PM   Specimen: Nasopharyngeal Swab  Result Value Ref Range Status   SARS Coronavirus 2 NEGATIVE NEGATIVE Final    Comment: (NOTE) SARS-CoV-2 target nucleic acids are NOT DETECTED.  The SARS-CoV-2 RNA is generally detectable in upper and lower respiratory specimens during the acute phase of infection. Negative results do not preclude SARS-CoV-2 infection, do not rule out co-infections with other pathogens, and should not be used as the sole basis for treatment or other patient management decisions. Negative results must be combined with clinical observations, patient history, and epidemiological information. The expected result is Negative.  Fact Sheet for Patients: SugarRoll.be  Fact Sheet for Healthcare Providers: https://www.woods-mathews.com/  This test is not yet approved or cleared by the [place] FDA and  has been authorized for detection and/or diagnosis of SARS-CoV-2 by FDA under an Emergency Use Authorization (EUA). This EUA will remain  in effect (meaning this test can be used) for the duration of the COVID-19 declaration under Se ction 564(b)(1) of the Act, 21 U.S.C. section 360bbb-3(b)(1), unless the authorization is terminated or revoked sooner.  Performed at [facility], [place] [address], [place], [place] [address]   MRSA PCR Screening     Status: None   Collection Time: 01/18/21  4:45 PM   Specimen: Nasopharyngeal  Result Value Ref Range Status   MRSA by PCR NEGATIVE NEGATIVE Final    Comment:        The GeneXpert MRSA Assay (FDA approved for NASAL  specimens only), is one component of a comprehensive MRSA colonization surveillance program. It is not intended to diagnose MRSA infection nor to guide or monitor treatment for MRSA infections. Performed at [facility], [address] [place], [place] [address]   Resp Panel by RT-PCR (Flu A&B, Covid) Nasopharyngeal Swab     Status: None   Collection Time: 01/22/21  9:38 AM   Specimen: Nasopharyngeal Swab; Nasopharyngeal(NP)  swabs in vial transport medium  Result Value Ref Range Status   SARS Coronavirus 2 by RT PCR NEGATIVE NEGATIVE Final    Comment: (NOTE) SARS-CoV-2 target nucleic acids are NOT DETECTED.  The SARS-CoV-2 RNA is generally detectable in upper respiratory specimens during the acute phase of infection. The lowest concentration of SARS-CoV-2 viral copies this assay can detect is 138 copies/mL. A negative result does not preclude SARS-Cov-2 infection and should not be used as the sole basis for treatment or other patient management decisions. A negative result may occur with  improper specimen collection/handling, submission of specimen other than nasopharyngeal swab, presence of viral mutation(s) within the areas targeted by this assay, and inadequate number of viral copies(<138 copies/mL). A negative result must be combined with clinical observations, patient history, and epidemiological information. The expected result is Negative.  Fact Sheet for Patients:  EntrepreneurPulse.com.au  Fact Sheet for Healthcare Providers:  IncredibleEmployment.be  This test is no t yet approved or cleared by the [place] FDA and  has been authorized for detection and/or diagnosis of SARS-CoV-2 by FDA under an Emergency Use Authorization (EUA). This EUA will remain  in effect (meaning this test can be used) for the duration of the COVID-19 declaration under Section 564(b)(1) of the Act, 21 U.S.C.section 360bbb-3(b)(1), unless  the authorization is terminated  or revoked sooner.       Influenza A by PCR NEGATIVE NEGATIVE Final   Influenza B by PCR NEGATIVE NEGATIVE Final    Comment: (NOTE) The Xpert Xpress SARS-CoV-2/FLU/RSV plus assay is intended as an aid in the diagnosis of influenza from Nasopharyngeal swab specimens and should not be used as a sole basis for treatment. Nasal washings and aspirates are unacceptable for Xpert Xpress SARS-CoV-2/FLU/RSV testing.  Fact Sheet for Patients: EntrepreneurPulse.com.au  Fact Sheet for Healthcare Providers: IncredibleEmployment.be  This test is not yet approved or cleared by the [place] FDA and has been authorized for detection and/or diagnosis of SARS-CoV-2 by FDA under an Emergency Use Authorization (EUA). This EUA will remain in effect (meaning this test can be used) for the duration of the COVID-19 declaration under Section 564(b)(1) of the Act, 21 U.S.C. section 360bbb-3(b)(1), unless the authorization is terminated or revoked.  Performed at [facility], [address], [place], [place] [address]      Time coordinating discharge: Over 30 minutes  SIGNED:   Phillips Climes, MD  [facility] 01/22/2021, 11:23 AM Pager   If 7PM-7AM, please contact night-coverage [email or web address] Password [email or web address]

## 2021-01-22 NOTE — Progress Notes (Signed)
resp panel sent to lab per new order, pt tolerated well

## 2021-01-22 NOTE — Progress Notes (Signed)
[facility]) Hospital Liaison RN note:  Spoke with daughter, [name], over the phone to initiate education related to hospice philosophy, services and to answer any questions. She verbalized understanding. Plan is for discharge today to [facility].  Please send signed and completed DNR home with patient. Please provide prescriptions at discharge as needed to ensure ongoing symptom management.  Please call with any hospice related questions or concerns.  Thank you for the opportunity to participate in this patient's care.  Zandra Abts, RN [facility] Liaison [phone]

## 2021-01-22 NOTE — Discharge Instructions (Signed)
Management per hospice 

## 2021-01-22 NOTE — TOC Transition Note (Signed)
Transition of Care [facility]) - CM/SW Discharge Note   Patient Details  Name: Lonnie [name]. MRN: [id number] Date of Birth: [birth year]  Transition of Care [facility]) CM/SW Contact:  Shelbie Hutching, RN Phone Number: 01/22/2021, 11:12 AM   Clinical Narrative:    Authorization received from the [facility] for Hospice at [facility].  [facility] will provide hospice services.  Patient will discharge today.  Patient going to room 319B.  Bedside RN will call report.  RNCM has arranged EMS transport for 1pm through [facility].  [facility] will be doing patient's admission assessment at 2pm over at [facility].  Family is aware of discharge plan for today.    Final next level of care: [place] (with [facility])) Barriers to Discharge: No Barriers Identified   Patient Goals and CMS Choice   CMS Medicare.gov Compare Post Acute Care list provided to:: Patient Represenative (must comment) Choice offered to / list presented to : Adult Children  Discharge Placement              Patient [facility] at: [facility] Patient to be transferred to facility by: [facility] Name of family member notified: Vickie Patient and family notified of of transfer: 01/22/21  Discharge Plan and Services     Post Acute Care Choice:  (TBD)                               Social Determinants of Health (SDOH) Interventions     Readmission Risk Interventions Readmission Risk Prevention Plan 12/18/2020  Transportation Screening Complete  PCP or Specialist Appt within 3-5 Days Complete  HRI or [place] Complete  Social Work Consult for [place] Planning/Counseling Complete  Palliative Care Screening Not Applicable  Medication Review Press photographer) Complete  Some recent data might be hidden

## 2021-02-06 ENCOUNTER — Other Ambulatory Visit: Payer: Self-pay

## 2021-02-06 ENCOUNTER — Emergency Department
Admission: EM | Admit: 2021-02-06 | Discharge: 2021-02-06 | Disposition: A | Discharge status: Home / Self Care | Payer: No Typology Code available for payment source | Attending: Emergency Medicine | Admitting: Emergency Medicine

## 2021-02-06 ENCOUNTER — Encounter: Payer: Self-pay | Admitting: Emergency Medicine

## 2021-02-06 DIAGNOSIS — G8929 Other chronic pain: Secondary | ICD-10-CM | POA: Diagnosis not present

## 2021-02-06 DIAGNOSIS — Z85828 Personal history of other malignant neoplasm of skin: Secondary | ICD-10-CM | POA: Diagnosis not present

## 2021-02-06 DIAGNOSIS — I129 Hypertensive chronic kidney disease with stage 1 through stage 4 chronic kidney disease, or unspecified chronic kidney disease: Secondary | ICD-10-CM | POA: Insufficient documentation

## 2021-02-06 DIAGNOSIS — N401 Enlarged prostate with lower urinary tract symptoms: Secondary | ICD-10-CM | POA: Diagnosis not present

## 2021-02-06 DIAGNOSIS — I2510 Atherosclerotic heart disease of native coronary artery without angina pectoris: Secondary | ICD-10-CM | POA: Diagnosis not present

## 2021-02-06 DIAGNOSIS — T83011A Breakdown (mechanical) of indwelling urethral catheter, initial encounter: Secondary | ICD-10-CM | POA: Diagnosis not present

## 2021-02-06 DIAGNOSIS — Z7951 Long term (current) use of inhaled steroids: Secondary | ICD-10-CM | POA: Insufficient documentation

## 2021-02-06 DIAGNOSIS — R339 Retention of urine, unspecified: Secondary | ICD-10-CM | POA: Diagnosis not present

## 2021-02-06 DIAGNOSIS — N183 Chronic kidney disease, stage 3 unspecified: Secondary | ICD-10-CM | POA: Insufficient documentation

## 2021-02-06 DIAGNOSIS — F0390 Unspecified dementia without behavioral disturbance: Secondary | ICD-10-CM | POA: Insufficient documentation

## 2021-02-06 DIAGNOSIS — M791 Myalgia, unspecified site: Secondary | ICD-10-CM | POA: Insufficient documentation

## 2021-02-06 DIAGNOSIS — Z87891 Personal history of nicotine dependence: Secondary | ICD-10-CM | POA: Diagnosis not present

## 2021-02-06 DIAGNOSIS — J449 Chronic obstructive pulmonary disease, unspecified: Secondary | ICD-10-CM | POA: Diagnosis not present

## 2021-02-06 DIAGNOSIS — T83511A Infection and inflammatory reaction due to indwelling urethral catheter, initial encounter: Secondary | ICD-10-CM

## 2021-02-06 LAB — COMPREHENSIVE METABOLIC PANEL
ALT: 52 U/L — ABNORMAL HIGH (ref 0–44)
AST: 24 U/L (ref 15–41)
Albumin: 3.3 g/dL — ABNORMAL LOW (ref 3.5–5.0)
Alkaline Phosphatase: 198 U/L — ABNORMAL HIGH (ref 38–126)
Anion gap: 11 (ref 5–15)
BUN: 51 mg/dL — ABNORMAL HIGH (ref 8–23)
CO2: 15 mmol/L — ABNORMAL LOW (ref 22–32)
Calcium: 9.3 mg/dL (ref 8.9–10.3)
Chloride: 109 mmol/L (ref 98–111)
Creatinine, Ser: 1.74 mg/dL — ABNORMAL HIGH (ref 0.61–1.24)
GFR, Estimated: 36 mL/min — ABNORMAL LOW (ref >60)
Glucose, Bld: 111 mg/dL — ABNORMAL HIGH (ref 70–99)
Potassium: 4.4 mmol/L (ref 3.5–5.1)
Sodium: 135 mmol/L (ref 135–145)
Total Bilirubin: 1.4 mg/dL — ABNORMAL HIGH (ref 0.3–1.2)
Total Protein: 7 g/dL (ref 6.5–8.1)

## 2021-02-06 LAB — CBC WITH DIFFERENTIAL/PLATELET
Abs Immature Granulocytes: 0.09 10*3/uL — ABNORMAL HIGH (ref 0.00–0.07)
Basophils Absolute: 0.1 10*3/uL (ref 0.0–0.1)
Basophils Relative: 0 %
Eosinophils Absolute: 0.1 10*3/uL (ref 0.0–0.5)
Eosinophils Relative: 0 %
HCT: 38 % — ABNORMAL LOW (ref 39.0–52.0)
Hemoglobin: 11.8 g/dL — ABNORMAL LOW (ref 13.0–17.0)
Immature Granulocytes: 1 %
Lymphocytes Relative: 7 %
Lymphs Abs: 1 10*3/uL (ref 0.7–4.0)
MCH: 32.3 pg (ref 26.0–34.0)
MCHC: 31.1 g/dL (ref 30.0–36.0)
MCV: 104.1 fL — ABNORMAL HIGH (ref 80.0–100.0)
Monocytes Absolute: 1.1 10*3/uL — ABNORMAL HIGH (ref 0.1–1.0)
Monocytes Relative: 7 %
Neutro Abs: 13.2 10*3/uL — ABNORMAL HIGH (ref 1.7–7.7)
Neutrophils Relative %: 85 %
Platelets: 177 10*3/uL (ref 150–400)
RBC: 3.65 MIL/uL — ABNORMAL LOW (ref 4.22–5.81)
RDW: 13.8 % (ref 11.5–15.5)
WBC: 15.6 10*3/uL — ABNORMAL HIGH (ref 4.0–10.5)
nRBC: 0 % (ref 0.0–0.2)

## 2021-02-06 LAB — URINALYSIS, COMPLETE (UACMP) WITH MICROSCOPIC
Bilirubin Urine: NEGATIVE
Glucose, UA: NEGATIVE mg/dL
Ketones, ur: NEGATIVE mg/dL
Nitrite: NEGATIVE
Protein, ur: 100 mg/dL — AB
RBC / HPF: >50 RBC/hpf — ABNORMAL HIGH (ref 0–5)
Specific Gravity, Urine: 1.016 (ref 1.005–1.030)
Squamous Epithelial / HPF: NONE SEEN (ref 0–5)
WBC, UA: >50 WBC/hpf — ABNORMAL HIGH (ref 0–5)
pH: 5 (ref 5.0–8.0)

## 2021-02-06 MED ORDER — CEPHALEXIN 500 MG PO CAPS: 500.0000 mg | ORAL_CAPSULE | Freq: Two times a day (BID) | ORAL | 0 refills | Status: DC

## 2021-02-06 MED ORDER — CEPHALEXIN 500 MG PO CAPS
500.0000 mg | ORAL_CAPSULE | Freq: Once | ORAL | Status: AC
  Administered 2021-02-06: 500 mg via ORAL
  Filled 2021-02-06: qty 1

## 2021-02-06 MED ORDER — CEPHALEXIN 500 MG PO CAPS: 500.0000 mg | ORAL_CAPSULE | Freq: Two times a day (BID) | ORAL | 0 refills | Status: AC

## 2021-02-06 NOTE — ED Provider Notes (Signed)
-----------------------------------------   2:55 PM on 02/06/2021 -----------------------------------------  Blood pressure 140/62, pulse 98, temperature 97.7 F (36.5 C), temperature source Oral, resp. rate 18, height 5\' 8"  (1.727 m), weight 75 kg, SpO2 97 %.  Assuming care from Dr. Joni Fears.  In short, Lonnie [name]. is a 87 y.o. male with a chief complaint of No chief complaint on file. Marland Kitchen  Refer to the original H&P for additional details.  The current plan of care is to follow-up UA results following foley catheter displacement, subsequently replaced by Urology.  ----------------------------------------- 3:26 PM on 02/06/2021 -----------------------------------------  UA is concerning for infection, urine was sent for culture and we will treat with Keflex.  He has a reported allergy to penicillins but has tolerated cephalosporins in the past.  We will give initial dose of antibiotics here in the ED, after which patient be appropriate for discharge home with urology follow-up.     Blake Divine, MD 02/06/21 [phone]

## 2021-02-06 NOTE — ED Notes (Signed)
Report given to Amy RN at [facility] at this time. Medical Necessity paperwork, and all d/c paperwork given to [facility], Financial controller at this time.

## 2021-02-06 NOTE — ED Notes (Signed)
Pt unable to sign MSE waiver at this time.

## 2021-02-06 NOTE — ED Triage Notes (Signed)
Pt brought [facility] from [facility] with c/o catheter issues. Per EMS, staff states urinary catheter was clotted so they took it out and want pt evaluated at this time.   Per EMS, pt alert to self only and is bedbound. Per EMS, pt able to follow some commands. EMS stated VS of 132/71, HR 94, 95% RA.   Per EMS, pt is DNR. Yellow DNR form placed in pt chart at this time.

## 2021-02-06 NOTE — Consult Note (Signed)
Subjective:  Dx: 1. BPH with Urinary retention         2. Mechanical breakdown of urethral catheter.   Consult requested by Dr. Carrie Mew  I was asked to see Mr. Lonnie [name] for catheter placement.  He is a 87 yo male who is in a long term care facility.  He has chronic retention despite a Urolift procedure by Dr. Bernardo Heater in January.   His catheter was noted to have stopped draining and the facility staff were unable to place the new catheter.   He was brought to the ER where 3 attempts to place an new catheter with a variety of catheter sizes were unsuccessful.   Mr. Kollen is non-communciative.   ROS:  Review of Systems  Unable to perform ROS: Dementia    Allergies  Allergen Reactions  . Sulfa Antibiotics Other (See Comments)    Not sure of reaction. It was too long ago, but he said not to give it to him.   . Sulfasalazine     Other reaction(s): Other (See Comments) Not sure of reaction. It was too long ago, but he said not to give it to him.   Marland Kitchen Penicillin G Rash    Tolerates ampicillin and amoxicillin    Past Medical History:  Diagnosis Date  . Anemia   . Basal cell carcinoma 11/13/2009   Right alar groove  . Basal cell carcinoma 04/28/2010   left preauricular, BCC with focal sclerosis. Exc. 06/04/2010  . Basal cell carcinoma 04/24/2013   right post auricular  . Basal cell carcinoma 10/26/2017   right inferior cheek above mandible, Superficial  . Basal cell carcinoma 03/22/2018   right post. base of neck  . Basal cell carcinoma 05/08/2018   post. neck, spinal, Superficial. EDC 05/08/2018  . Basal cell carcinoma 07/23/2018   left mid supraclavicular, Superficial and nodular pattern. EDC  . Basal cell carcinoma 07/23/2018   right superior chest parasternal. Superficial and nodular pattern. EDC  . Basal cell carcinoma 05/16/2019   left lateral neck. Superficial and nodular pattern. EDC  . Basal cell carcinoma 11/07/2019   left lateral forehead. Nodular. EDC  . Basal cell  carcinoma 12/23/2019   Left ala nasi. BCC with sclerosis.  . Basal cell carcinoma 11/19/2020   L ear post aspect,excised 12/01/20  . Bundle branch block, left   . CKD (chronic kidney disease) stage 3, GFR 30-59 ml/min (HCC)   . COPD (chronic obstructive pulmonary disease) ([place])   . Coronary artery disease   . Family history of adverse reaction to anesthesia    DAUGHTER-N/V  . GERD (gastroesophageal reflux disease)   . History of asbestosis   . HLD (hyperlipidemia)   . Hypertension   . Pneumonia 2017  . PSA elevation     Past Surgical History:  Procedure Laterality Date  . APPENDECTOMY    . COLONOSCOPY WITH ESOPHAGOGASTRODUODENOSCOPY (EGD)    . CYSTOSCOPY WITH INSERTION OF UROLIFT N/A 10/06/2020   Procedure: CYSTOSCOPY WITH INSERTION OF UROLIFT;  Surgeon: Abbie Sons, MD;  Location: [facility] ORS;  Service: Urology;  Laterality: N/A;  . IR CATHETER TUBE CHANGE  01/19/2021  . RECTAL SURGERY     x3 ABSCESS    Social History   Socioeconomic History  . Marital status: Widowed    Spouse name: Not on file  . Number of children: Not on file  . Years of education: Not on file  . Highest education level: Not on file  Occupational History  . Not  on file  Tobacco Use  . Smoking status: Former Smoker    Packs/day: 2.00    Years: 50.00    Pack years: 100.00    Types: Cigarettes    Quit date: 09/24/1989    Years since quitting: 31.3  . Smokeless tobacco: Never Used  Vaping Use  . Vaping Use: Never used  Substance and Sexual Activity  . Alcohol use: Yes    Alcohol/week: 0.0 standard drinks    Comment: OCC RUM AND COKE  . Drug use: No  . Sexual activity: Not Currently  Other Topics Concern  . Not on file  Social History Narrative  . Not on file   Social Determinants of Health   Financial Resource Strain: Not on file  Food Insecurity: Not on file  Transportation Needs: Not on file  Physical Activity: Not on file  Stress: Not on file  Social Connections: Not on file   Intimate Partner Violence: Not on file    Family History  Problem Relation Age of Onset  . Heart attack Mother   . Heart attack Father     Anti-infectives: Anti-infectives (From admission, onward)   None      No current facility-administered medications for this encounter.   Current Outpatient Medications  Medication Sig Dispense Refill  . alfuzosin (UROXATRAL) 10 MG 24 hr tablet Take 10 mg by mouth daily with breakfast.    . bisacodyl (DULCOLAX) 5 MG EC tablet Take 10 mg by mouth 2 (two) times daily as needed.    . budesonide-formoterol (SYMBICORT) 160-4.5 MCG/ACT inhaler Inhale 2 puffs into the lungs 2 (two) times daily.     . feeding supplement (ENSURE ENLIVE / ENSURE PLUS) LIQD Take 237 mLs by mouth 3 (three) times daily between meals. 237 mL 12  . ferrous sulfate 325 (65 FE) MG tablet Take 325 mg by mouth 2 (two) times daily with a meal.     . finasteride (PROSCAR) 5 MG tablet Take 5 mg by mouth every morning.    . gabapentin (NEURONTIN) 100 MG capsule Take 1 capsule (100 mg total) by mouth 2 (two) times daily.    . haloperidol (HALDOL) 1 MG tablet Take 1 tablet (1 mg total) by mouth every 6 (six) hours as needed for agitation. 90 tablet 2  . Ipratropium-Albuterol (COMBIVENT RESPIMAT) 20-100 MCG/ACT AERS respimat Inhale 1 puff into the lungs every morning.    . Magnesium Oxide 250 MG TABS Take 500 mg by mouth every morning.    . mirtazapine (REMERON SOL-TAB) 15 MG disintegrating tablet Take 1 tablet (15 mg total) by mouth at bedtime.    Marland Kitchen morphine 20 MG/5ML solution Take 0.6 mLs (2.4 mg total) by mouth every 4 (four) hours as needed for pain (or dyspnea). 20 mL 0  . nystatin (MYCOSTATIN/NYSTOP) powder Apply topically 3 (three) times daily. 15 g 0  . omeprazole (PRILOSEC) 20 MG capsule Take 20 mg by mouth 2 (two) times daily.    . ondansetron (ZOFRAN) 4 MG tablet Take 1 tablet (4 mg total) by mouth every 6 (six) hours as needed for nausea. 20 tablet 0  . QUEtiapine (SEROQUEL)  25 MG tablet Take 1 tablet (25 mg total) by mouth at bedtime.    . sennosides-docusate sodium (SENOKOT-S) 8.6-50 MG tablet Take 2 tablets by mouth daily.    . vitamin B-12 (CYANOCOBALAMIN) 1000 MCG tablet Take 1,000 mcg by mouth daily.       Objective: Vital signs in last 24 hours: BP 140/62  Pulse 98   Temp 97.7 F (36.5 C) (Oral)   Resp 18   Ht 5\' 8"  (1.727 m)   Wt 75 kg   SpO2 97%   BMI 25.14 kg/m   Intake/Output from previous day: No intake/output data recorded. Intake/Output this shift: No intake/output data recorded.   Physical Exam Vitals reviewed.  Constitutional:      Appearance: He is ill-appearing.  Abdominal:     Comments: Suprapubic mass consistent with a distended bladder.    Genitourinary:    Comments: Uncirc phallus with blood at the meatus.   Scrotum, testes and epididymis normal.     Lab Results:  Results for orders placed or performed during the hospital encounter of 02/06/21 (from the past 24 hour(s))  Comprehensive metabolic panel     Status: Abnormal   Collection Time: 02/06/21  1:08 PM  Result Value Ref Range   Sodium 135 135 - 145 mmol/L   Potassium 4.4 3.5 - 5.1 mmol/L   Chloride 109 98 - 111 mmol/L   CO2 15 (L) 22 - 32 mmol/L   Glucose, Bld 111 (H) 70 - 99 mg/dL   BUN 51 (H) 8 - 23 mg/dL   Creatinine, Ser 6.83 (H) 0.61 - 1.24 mg/dL   Calcium 9.3 8.9 - 72.9 mg/dL   Total Protein 7.0 6.5 - 8.1 g/dL   Albumin 3.3 (L) 3.5 - 5.0 g/dL   AST 24 15 - 41 U/L   ALT 52 (H) 0 - 44 U/L   Alkaline Phosphatase 198 (H) 38 - 126 U/L   Total Bilirubin 1.4 (H) 0.3 - 1.2 mg/dL   GFR, Estimated 36 (L) >60 mL/min   Anion gap 11 5 - 15  CBC with Differential     Status: Abnormal   Collection Time: 02/06/21  1:08 PM  Result Value Ref Range   WBC 15.6 (H) 4.0 - 10.5 K/uL   RBC 3.65 (L) 4.22 - 5.81 MIL/uL   Hemoglobin 11.8 (L) 13.0 - 17.0 g/dL   HCT 02.1 (L) 11.5 - 52.0 %   MCV 104.1 (H) 80.0 - 100.0 fL   MCH 32.3 26.0 - 34.0 pg   MCHC 31.1 30.0 -  36.0 g/dL   RDW 80.2 23.3 - 61.2 %   Platelets 177 150 - 400 K/uL   nRBC 0.0 0.0 - 0.2 %   Neutrophils Relative % 85 %   Neutro Abs 13.2 (H) 1.7 - 7.7 K/uL   Lymphocytes Relative 7 %   Lymphs Abs 1.0 0.7 - 4.0 K/uL   Monocytes Relative 7 %   Monocytes Absolute 1.1 (H) 0.1 - 1.0 K/uL   Eosinophils Relative 0 %   Eosinophils Absolute 0.1 0.0 - 0.5 K/uL   Basophils Relative 0 %   Basophils Absolute 0.1 0.0 - 0.1 K/uL   Immature Granulocytes 1 %   Abs Immature Granulocytes 0.09 (H) 0.00 - 0.07 K/uL    BMET Recent Labs    02/06/21 1308  NA 135  K 4.4  CL 109  CO2 15*  GLUCOSE 111*  BUN 51*  CREATININE 1.74*  CALCIUM 9.3   PT/INR No results for input(s): LABPROT, INR in the last 72 hours. ABG No results for input(s): PHART, HCO3 in the last 72 hours.  Invalid input(s): PCO2, PO2  Studies/Results: I have reviewed his CT from 12/08/20.  Urolift tabs noted in moderate sized prostate.   Case discussed with Dr. Scotty Court and prior records reviewed.   Procedure: Complicated foley placement.  He was  prepped with betadine and draped with sterile towels.  The urethra was instilled with 17ml of lubricant and an 73fr coude foley was passed.  Initially the catheter wouldn't fully advance and appeared to be entering false passages, but with rotation of the catheter tip I was eventually able to enter the true lumen and pass the catheter to the bladder with return of concentrated but non-bloody urine.  The balloon was filled with 44ml of sterile water and a bedside bag was attached.   Assessment/Plan: Urinary retention with catheter complications.   I was able to get an 62fr coude in despite apparent false passages from prior attempts.  The urine was dark but not bloody.   He will be best served by a foley exchange in Dr. Dene Gentry office in about 4 weeks.  I have messaged Dr. Bernardo Heater.   No orders of the defined types were placed in this encounter.      CC: Dr. Carrie Mew and Dr.  Adriahna Shearman Giovanni.      Irine Seal 02/06/2021 [phone]

## 2021-02-06 NOTE — ED Provider Notes (Signed)
[facility] Emergency Department Provider Note  ____________________________________________  Time seen: Approximately 2:08 PM  I have reviewed the triage vital signs and the nursing notes.   HISTORY  Chief Complaint Urinary retention  Level 5 Caveat: Portions of the History and Physical including HPI and review of systems are unable to be completely obtained due to chronic dementia   HPI Lonnie [name]. is a 87 y.o. male with a history of CKD COPD hypertension and dementia who is brought to the ED due to obstructed chronic Foley catheter.  Nursing home reported to EMS that they noticed that his catheter was obstructed.  They removed it to try to replace it, but were unable to place a new catheter.  Patient complains of body aches and chronic pains but no acute symptoms.   Patient is under hospice care and DNR.  Hospice advises they are sending patient to ED for Foley catheter placement, and anticipate his return to facility afterward.   Past Medical History:  Diagnosis Date  . Anemia   . Basal cell carcinoma 11/13/2009   Right alar groove  . Basal cell carcinoma 04/28/2010   left preauricular, BCC with focal sclerosis. Exc. 06/04/2010  . Basal cell carcinoma 04/24/2013   right post auricular  . Basal cell carcinoma 10/26/2017   right inferior cheek above mandible, Superficial  . Basal cell carcinoma 03/22/2018   right post. base of neck  . Basal cell carcinoma 05/08/2018   post. neck, spinal, Superficial. EDC 05/08/2018  . Basal cell carcinoma 07/23/2018   left mid supraclavicular, Superficial and nodular pattern. EDC  . Basal cell carcinoma 07/23/2018   right superior chest parasternal. Superficial and nodular pattern. EDC  . Basal cell carcinoma 05/16/2019   left lateral neck. Superficial and nodular pattern. EDC  . Basal cell carcinoma 11/07/2019   left lateral forehead. Nodular. EDC  . Basal cell carcinoma 12/23/2019   Left ala nasi. BCC with  sclerosis.  . Basal cell carcinoma 11/19/2020   L ear post aspect,excised 12/01/20  . Bundle branch block, left   . CKD (chronic kidney disease) stage 3, GFR 30-59 ml/min (HCC)   . COPD (chronic obstructive pulmonary disease) ([place])   . Coronary artery disease   . Family history of adverse reaction to anesthesia    DAUGHTER-N/V  . GERD (gastroesophageal reflux disease)   . History of asbestosis   . HLD (hyperlipidemia)   . Hypertension   . Pneumonia 2017  . PSA elevation      Patient Active Problem List   Diagnosis Date Noted  . Hospice care patient 01/22/2021  . Protein-calorie malnutrition, severe 01/21/2021  . Pressure injury of skin 01/14/2021  . AMS (altered mental status) 01/12/2021  . Hyperkalemia 01/12/2021  . Hyponatremia 01/12/2021  . Metabolic acidosis, normal anion gap (NAG) 01/12/2021  . Acute metabolic encephalopathy 16/07/9603  . Acute respiratory failure ([place]) 01/12/2021  . Ileus ([place])   . Constipation   . Abdominal pain   . Non-intractable vomiting   . Leukocytosis   . Wide-complex tachycardia ([employer])   . Thrush   . Generalized weakness 12/16/2020  . Acute urinary retention 12/16/2020  . Dehydration 12/16/2020  . Acute kidney injury superimposed on CKD ([place]) 12/16/2020  . Acute cholecystitis 12/09/2020  . Cholecystitis 12/08/2020  . Basal cell carcinoma (BCC) of left ear 12/08/2020  . Hypotonicity of bladder 11/29/2018  . Acute lower UTI 08/11/2018  . UTI (urinary tract infection) 08/11/2018  . Incomplete bladder emptying 08/09/2017  .  Benign prostatic hyperplasia 08/09/2017  . Stage 3 chronic kidney disease ([place]) 08/09/2017  . Pneumonia 01/10/2016  . Nocturia 01/28/2015  . Bundle branch block, left 06/11/2014  . Dizziness 06/11/2014  . Dyspnea 06/11/2014  . History of vertigo 06/11/2014  . Hx of asbestosis 06/11/2014  . Hyperlipidemia 06/11/2014  . Essential hypertension 06/11/2014  . Chronic obstructive pulmonary disease, unspecified ([place])  03/06/2014  . Elevated prostate specific antigen (PSA) 05/13/2013     Past Surgical History:  Procedure Laterality Date  . APPENDECTOMY    . COLONOSCOPY WITH ESOPHAGOGASTRODUODENOSCOPY (EGD)    . CYSTOSCOPY WITH INSERTION OF UROLIFT N/A 10/06/2020   Procedure: CYSTOSCOPY WITH INSERTION OF UROLIFT;  Surgeon: Abbie Sons, MD;  Location: [facility] ORS;  Service: Urology;  Laterality: N/A;  . IR CATHETER TUBE CHANGE  01/19/2021  . RECTAL SURGERY     x3 ABSCESS     Prior to Admission medications   Medication Sig Start Date End Date Taking? Authorizing Provider  alfuzosin (UROXATRAL) 10 MG 24 hr tablet Take 10 mg by mouth daily with breakfast.    [provider]  bisacodyl (DULCOLAX) 5 MG EC tablet Take 10 mg by mouth 2 (two) times daily as needed. 09/18/20   [provider]  budesonide-formoterol (SYMBICORT) 160-4.5 MCG/ACT inhaler Inhale 2 puffs into the lungs 2 (two) times daily.  01/31/18   [provider]  feeding supplement (ENSURE ENLIVE / ENSURE PLUS) LIQD Take 237 mLs by mouth 3 (three) times daily between meals. 01/22/21   Elgergawy, Silver Huguenin, MD  ferrous sulfate 325 (65 FE) MG tablet Take 325 mg by mouth 2 (two) times daily with a meal.     [provider]  finasteride (PROSCAR) 5 MG tablet Take 5 mg by mouth every morning.    [provider]  gabapentin (NEURONTIN) 100 MG capsule Take 1 capsule (100 mg total) by mouth 2 (two) times daily. 12/24/20   Wyvonnia Dusky, MD  haloperidol (HALDOL) 1 MG tablet Take 1 tablet (1 mg total) by mouth every 6 (six) hours as needed for agitation. 01/22/21 01/22/22  Elgergawy, Silver Huguenin, MD  Ipratropium-Albuterol (COMBIVENT RESPIMAT) 20-100 MCG/ACT AERS respimat Inhale 1 puff into the lungs every morning.    [provider]  Magnesium Oxide 250 MG TABS Take 500 mg by mouth every morning.    [provider]  mirtazapine (REMERON SOL-TAB) 15 MG disintegrating tablet Take 1 tablet (15 mg total)  by mouth at bedtime. 01/22/21   Elgergawy, Silver Huguenin, MD  morphine 20 MG/5ML solution Take 0.6 mLs (2.4 mg total) by mouth every 4 (four) hours as needed for pain (or dyspnea). 01/22/21   Elgergawy, Silver Huguenin, MD  nystatin (MYCOSTATIN/NYSTOP) powder Apply topically 3 (three) times daily. 12/14/20   Geradine Girt, DO  omeprazole (PRILOSEC) 20 MG capsule Take 20 mg by mouth 2 (two) times daily.    [provider]  ondansetron (ZOFRAN) 4 MG tablet Take 1 tablet (4 mg total) by mouth every 6 (six) hours as needed for nausea. 01/22/21   Elgergawy, Silver Huguenin, MD  QUEtiapine (SEROQUEL) 25 MG tablet Take 1 tablet (25 mg total) by mouth at bedtime. 01/22/21   Elgergawy, Silver Huguenin, MD  sennosides-docusate sodium (SENOKOT-S) 8.6-50 MG tablet Take 2 tablets by mouth daily.    [provider]  vitamin B-12 (CYANOCOBALAMIN) 1000 MCG tablet Take 1,000 mcg by mouth daily.    [provider]     Allergies Sulfa antibiotics, Sulfasalazine, and Penicillin  g   Family History  Problem Relation Age of Onset  . Heart attack Mother   . Heart attack Father     Social History Social History   Tobacco Use  . Smoking status: Former Smoker    Packs/day: 2.00    Years: 50.00    Pack years: 100.00    Types: Cigarettes    Quit date: 09/24/1989    Years since quitting: 31.3  . Smokeless tobacco: Never Used  Vaping Use  . Vaping Use: Never used  Substance Use Topics  . Alcohol use: Yes    Alcohol/week: 0.0 standard drinks    Comment: OCC RUM AND COKE  . Drug use: No    Review of Systems  Constitutional:   No fever or chills.  ENT:   No sore throat. No rhinorrhea. Cardiovascular:   No chest pain or syncope. Respiratory:   No dyspnea or cough. Gastrointestinal:   Positive abdominal pain, no vomiting Musculoskeletal:   Negative for focal pain or swelling All other systems reviewed and are negative except as documented above in ROS and  HPI.  ____________________________________________   PHYSICAL EXAM:  VITAL SIGNS: ED Triage Vitals  Enc Vitals Group     BP 02/06/21 1200 123/65     Pulse Rate 02/06/21 1200 97     Resp 02/06/21 1200 18     Temp 02/06/21 1200 97.7 F (36.5 C)     Temp Source 02/06/21 1200 Oral     SpO2 02/06/21 1200 96 %     Weight 02/06/21 1203 165 lb 5.5 oz (75 kg)     Height 02/06/21 1203 5\' 8"  (1.727 m)     Head Circumference --      Peak Flow --      Pain Score --      Pain Loc --      Pain Edu? --      Excl. in [place]? --     Vital signs reviewed, nursing assessments reviewed.   Constitutional:   Alert and oriented to self. Non-toxic appearance. Eyes:   Conjunctivae are normal. EOMI. PERRL. ENT      Head:   Normocephalic and atraumatic.      Nose:   Wearing a mask.      Mouth/Throat:   Wearing a mask.      Neck:   No meningismus. Full ROM. Hematological/Lymphatic/Immunilogical:   No cervical lymphadenopathy. Cardiovascular:   RRR. Symmetric bilateral radial and DP pulses.  No murmurs. Cap refill less than 2 seconds. Respiratory:   Normal respiratory effort without tachypnea/retractions. Breath sounds are clear and equal bilaterally. No wheezes/rales/rhonchi. Gastrointestinal:   Soft with suprapubic tenderness and marked fullness. Non distended.  No rebound, rigidity, or guarding.  Musculoskeletal:   Normal range of motion in all extremities. No joint effusions.  No lower extremity tenderness.  No edema. Neurologic:   Normal speech and language.  Motor grossly intact. No acute focal neurologic deficits are appreciated.  Skin:    Skin is warm, dry and intact. No rash noted.  No petechiae, purpura, or bullae.  ____________________________________________    LABS (pertinent positives/negatives) (all labs ordered are listed, but only abnormal results are displayed) Labs Reviewed  COMPREHENSIVE METABOLIC PANEL - Abnormal; Notable for the following components:      Result Value    CO2 15 (*)    Glucose, Bld 111 (*)    BUN 51 (*)    Creatinine, Ser 1.74 (*)    Albumin 3.3 (*)  ALT 52 (*)    Alkaline Phosphatase 198 (*)    Total Bilirubin 1.4 (*)    GFR, Estimated 36 (*)    All other components within normal limits  CBC WITH DIFFERENTIAL/PLATELET - Abnormal; Notable for the following components:   WBC 15.6 (*)    RBC 3.65 (*)    Hemoglobin 11.8 (*)    HCT 38.0 (*)    MCV 104.1 (*)    Neutro Abs 13.2 (*)    Monocytes Absolute 1.1 (*)    Abs Immature Granulocytes 0.09 (*)    All other components within normal limits  RESP PANEL BY RT-PCR (FLU A&B, COVID) [id number]   ____________________________________________   EKG    ____________________________________________    RADIOLOGY  No results found.  ____________________________________________   PROCEDURES Procedures  ____________________________________________    CLINICAL IMPRESSION / ASSESSMENT AND PLAN / ED COURSE  Medications ordered in the ED: Medications - No data to display  Pertinent labs & imaging results that were available during my care of the patient were reviewed by me and considered in my medical decision making (see chart for details).  Lonnie [name]. was evaluated in Emergency Department on 02/06/2021 for the symptoms described in the history of present illness. He was evaluated in the context of the global COVID-19 pandemic, which necessitated consideration that the patient might be at risk for infection with the SARS-CoV-2 virus that causes COVID-19. Institutional protocols and algorithms that pertain to the evaluation of patients at risk for COVID-19 are in a state of rapid change based on information released by regulatory bodies including the CDC and federal and state organizations. These policies and algorithms were followed during the patient's care in the ED.   Patient sent to ED for inability to urinate after Foley was removed at his facility due to suspected  obstruction, and unable to be replaced.  Nursing unable to place a new Foley in the ED due to clotted blood in the urethra.  Urology Dr. Jeffie Pollock came to bedside and inserted a new coud catheter.  It is draining copious clear urine.  This is suspicious for the previous Foley catheter being out of position, balloon inflated in the urethra causing distal bleeding without hematuria.  Labs show slight elevation of creatinine above chronic baseline, but not worrisome for acute renal failure.  There is mild leukocytosis which may be due to pain versus UTI.  Patient's not septic.  Hospice encourages patient to be sent back to his facility after Foley placement.  Will check urinalysis for signs of infection, prescribe antibiotics if needed.      ____________________________________________   FINAL CLINICAL IMPRESSION(S) / ED DIAGNOSES    Final diagnoses:  Urinary retention  Chronic obstructive pulmonary disease, unspecified COPD type [facility])     ED Discharge Orders    None      Portions of this note were generated with dragon dictation software. Dictation errors may occur despite best attempts at proofreading.   Carrie Mew, MD 02/06/21 [phone]

## 2021-02-06 NOTE — ED Notes (Signed)
Pt d/c paperwork, yellow DNR form, and [place] paperwork given to EMS at this time. Pt on EMS stretcher to be transported back to [facility] at this time.

## 2021-02-06 NOTE — ED Notes (Signed)
Urology at bedside. New catheter placed at this time.

## 2021-02-09 LAB — URINE CULTURE: Culture: >=100000 — AB

## 2021-02-15 ENCOUNTER — Telehealth: Payer: Self-pay | Admitting: Urology

## 2021-02-15 NOTE — Telephone Encounter (Signed)
Patient's daughter called he was in the ER on 02/06/21 and had a foley placed. He has been at [facility] for 4 weeks now and will continue to be there. They are wanting him to keep it in and so they are going to change it monthly there and his health is declining and she said it shouldn't be much longer now. She is cancelling his follow up for this week. I asked her to keep [place] posted and if they needed anything to let us know.    Sharyn Lull

## 2021-02-16 NOTE — Telephone Encounter (Signed)
Noted, thanks!

## 2021-02-17 ENCOUNTER — Ambulatory Visit: Payer: Self-pay | Admitting: Urology

## 2021-03-03 DEATH — deceased

## 2021-03-08 ENCOUNTER — Encounter: Payer: Self-pay | Admitting: Urology

## 2022-07-14 IMAGING — CT CT IMAGE GUIDED DRAINAGE BY PERCUTANEOUS CATHETER
1 of 4 series · 10 of 32 positions shown, 16 images · non-contrast
Comparison: CT abdomen and pelvis-12/08/2020;

INDICATION: Acute cholecystitis. Poor operative candidate. Please perform image
guided cholecystostomy tube placement for infection source control
purposes.

EXAM:
CT AND FLUOROSCOPIC-GUIDED CHOLECYSTOSTOMY TUBE PLACEMENT

[Series 2: i-spiral 5.0 b30f · axial · 0.78mm/px · z∈[+1173,+1278]mm · 10 of 38 slices shown, 16 images]
[im 4/38  soft-tissue]
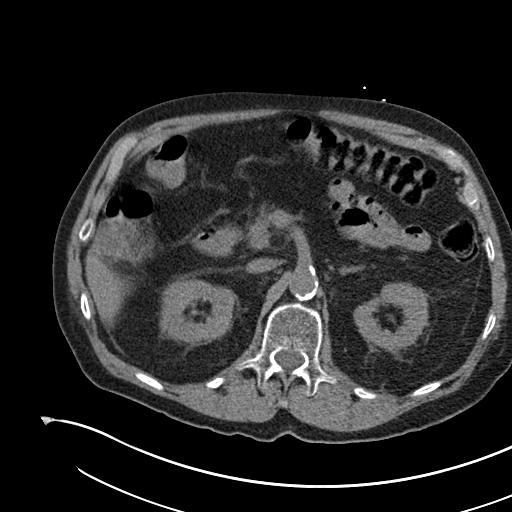
[im 4/38  bone]
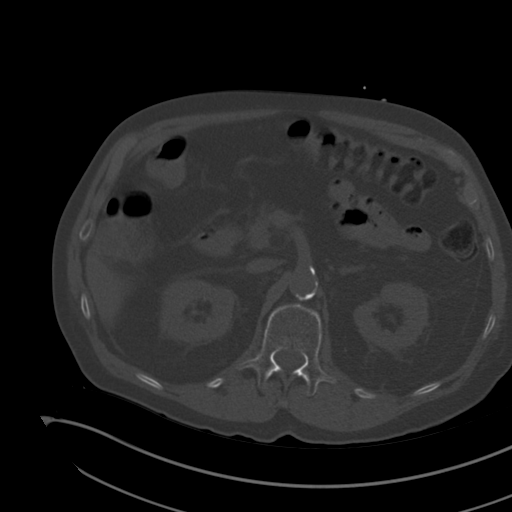
[im 7/38  soft-tissue]
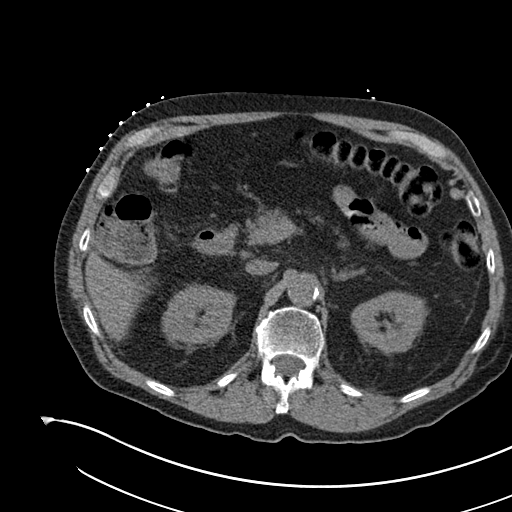
[im 11/38  soft-tissue]
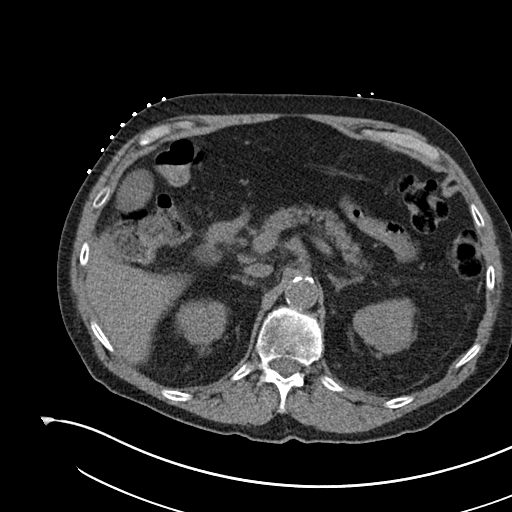
[im 14/38  soft-tissue]
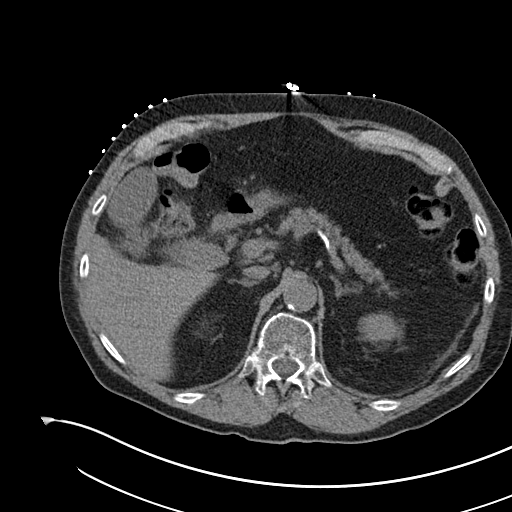
[im 17/38  soft-tissue]
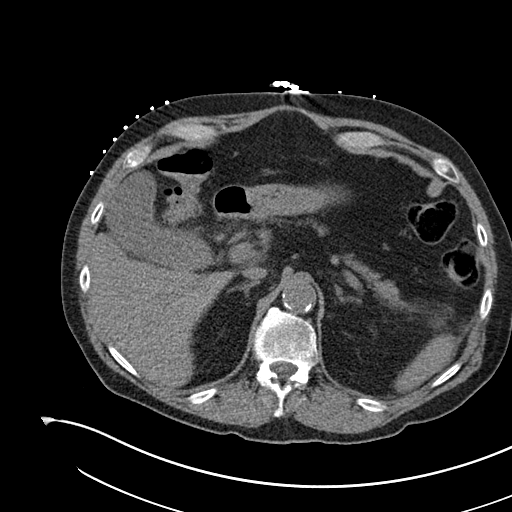
[im 21/38  soft-tissue]
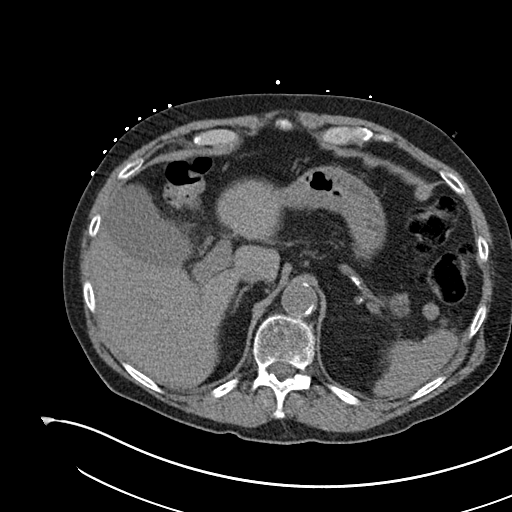
[im 24/38  soft-tissue]
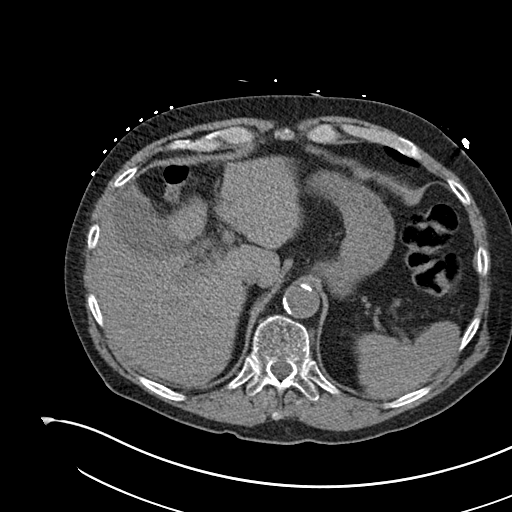
[im 24/38  lung]
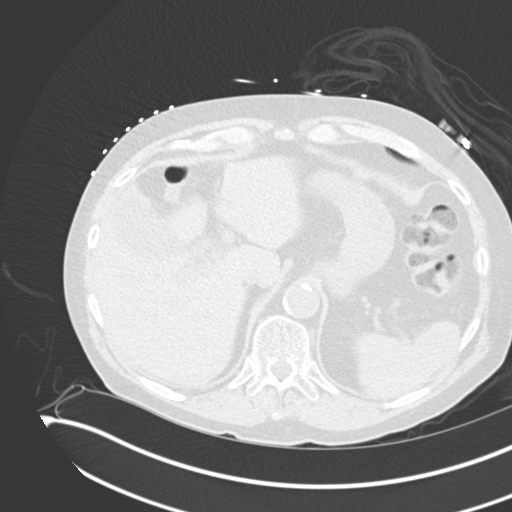
[im 27/38  soft-tissue]
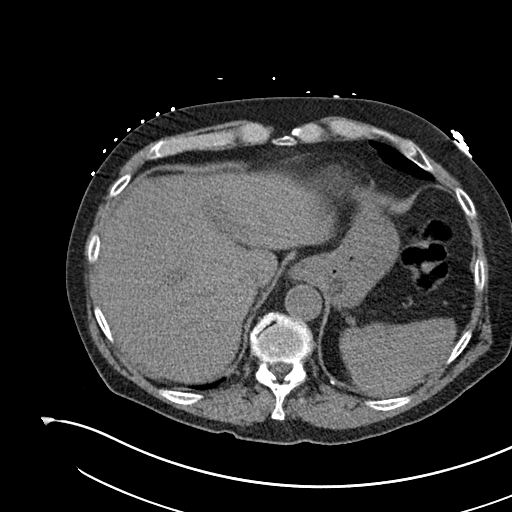
[im 27/38  lung]
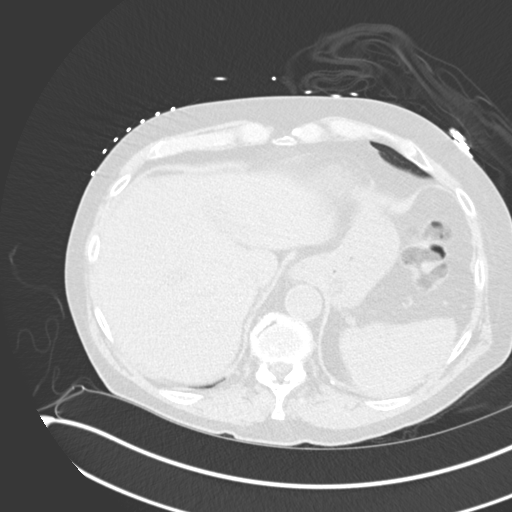
[im 31/38  soft-tissue]
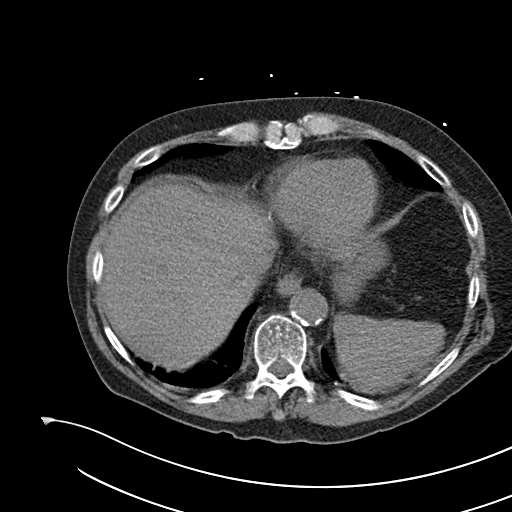
[im 31/38  lung]
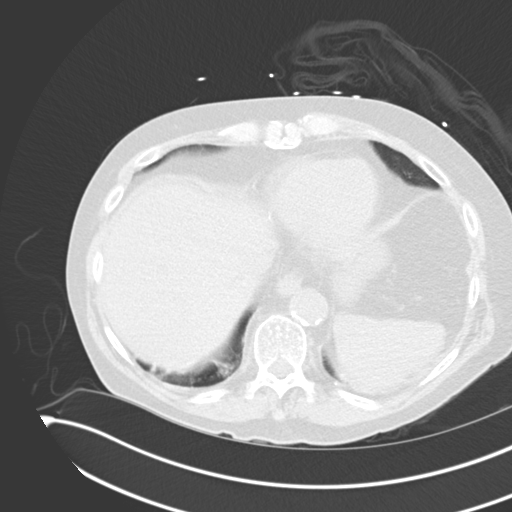
[im 31/38  bone]
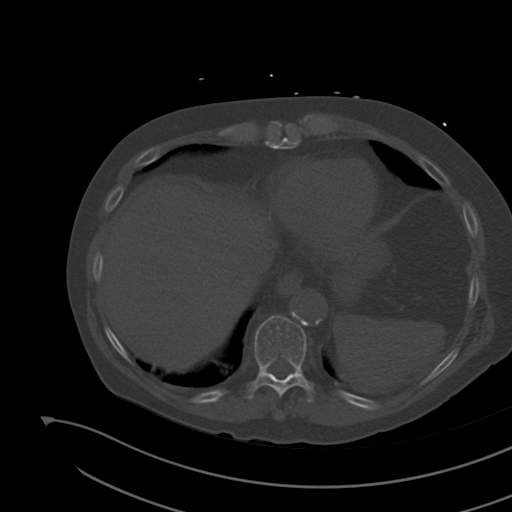
[im 34/38  soft-tissue]
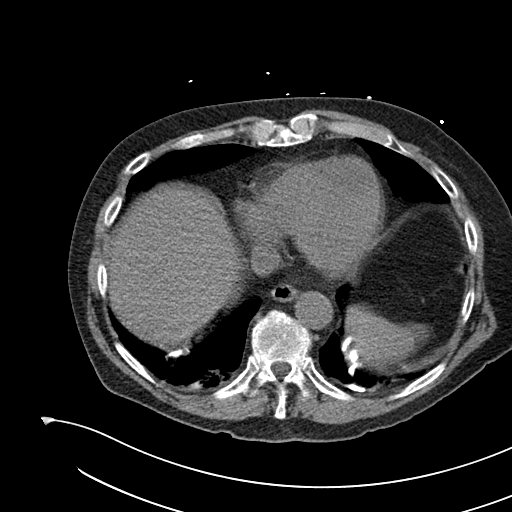
[im 34/38  lung]
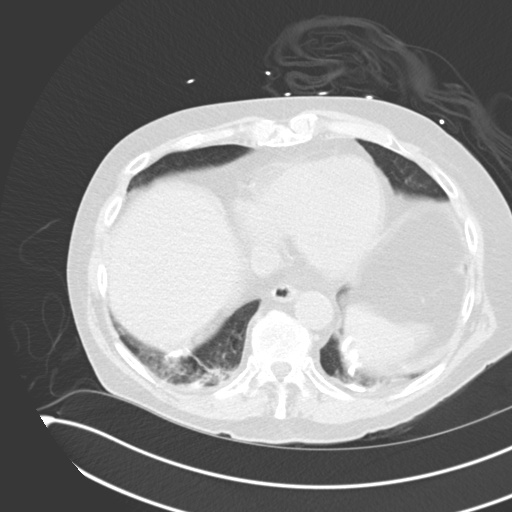

[10 of 32 positions shown; findings below may reference images not displayed]

right upper quadrant
abdominal ultrasound-12/08/2020

MEDICATIONS:
The patient is currently admitted to the hospital and on intravenous
antibiotics. Antibiotics were administered within an appropriate
time frame prior to skin puncture.

ANESTHESIA/SEDATION:
Moderate (conscious) sedation was employed during this procedure. A
total of Versed 1 mg and Fentanyl 100 mcg was administered
intravenously.

Moderate Sedation Time: 23 minutes. The patient's level of
consciousness and vital signs were monitored continuously by
radiology nursing throughout the procedure under my direct
supervision.

CONTRAST:  None

FLUOROSCOPY TIME:  Not provided

COMPLICATIONS:
None immediate.

PROCEDURE:
Informed written consent was obtained from the patient after a
discussion of the risks, benefits and alternatives to treatment.
Questions regarding the procedure were encouraged and answered. A
timeout was performed prior to the initiation of the procedure.

Patient was positioned supine on the CT gantry and noncontrast
images were obtained of the abdomen demonstrating similar appearance
of the mildly distended gallbladder with associated gallbladder wall
thickening and minimal amount of pericholecystic stranding. The
gallbladder was then identified sonographically.

The right upper abdominal quadrant was prepped and draped in the
usual sterile fashion, and a sterile drape was applied covering the
operative field. Maximum barrier sterile technique with sterile
gowns and gloves were used for the procedure. A timeout was
performed prior to the initiation of the procedure. Local anesthesia
was provided with 1% lidocaine with epinephrine.

Utilizing a transhepatic approach, an 18 gauge needle was advanced
into the gallbladder under direct ultrasound guidance. An ultrasound
image was saved for documentation purposes. Appropriate position was
confirmed with CT imaging

Next, the track was dilated allowing placement of a 10.2-Inelda Nikol
cholecystomy tube was advanced into the gallbladder lumen, coiled
and locked. A small amount of aspirated bile was capped and sent to
the laboratory for analysis. The catheter was secured to the skin
with suture, connected to a drainage bag and a dressing was placed.
The patient tolerated the procedure well without immediate post
procedural complication.
IMPRESSION: Successful ultrasound and CT guided placement of a 10.2 French
cholecystostomy tube. Small amount of aspirated bile was sent to the
laboratory for analysis.

## 2022-08-24 IMAGING — XA IR CATHETER TUBE CHANGE
4 series · 14 of 16 positions shown · non-contrast
Comparison: none

INDICATION: Status post percutaneous cholecystostomy tube placement to treat
acute cholecystitis on 12/09/2020. There is evidence of possible
tube retraction at the bedside during current hospitalization.

[Series 1: fluoroscopy - stored · 3 of 138 frames shown (1 of 4)]
[frame 21/138]
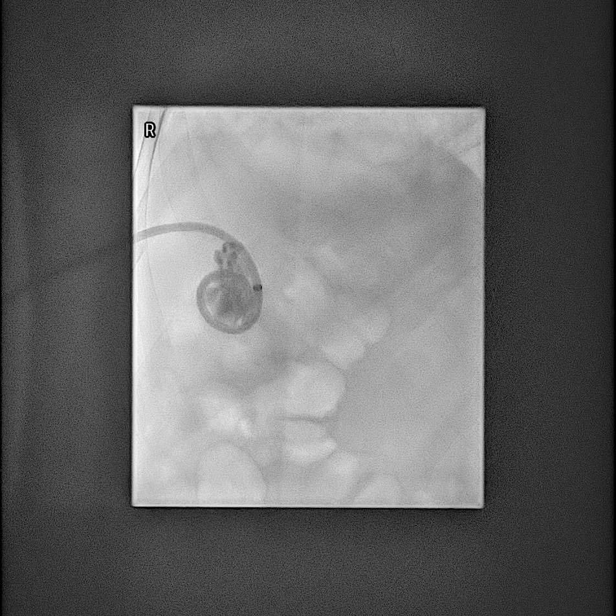
[frame 70/138]
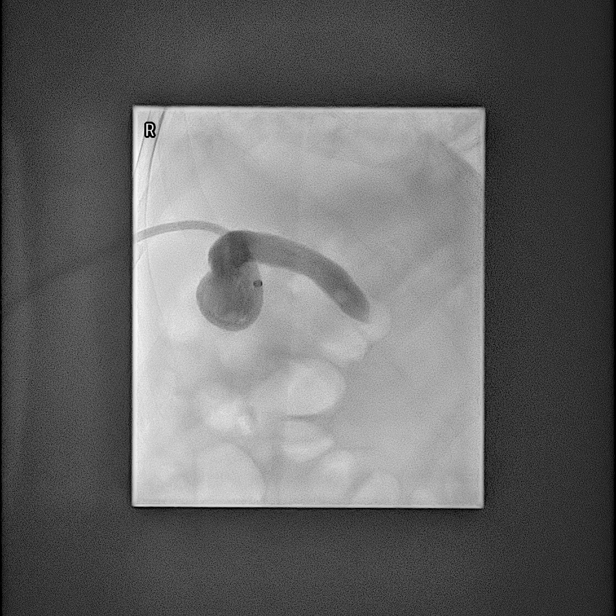
[frame 118/138]
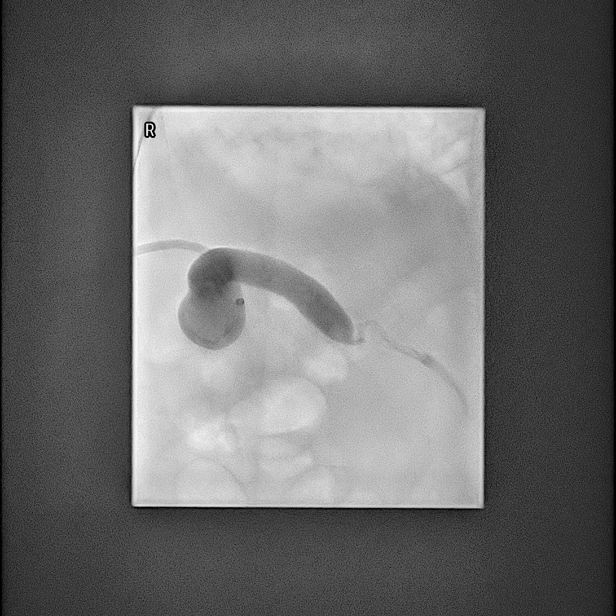

[Series 2: fluoroscopy - stored · 4 of 82 frames shown (2 of 4)]
[frame 1/82]
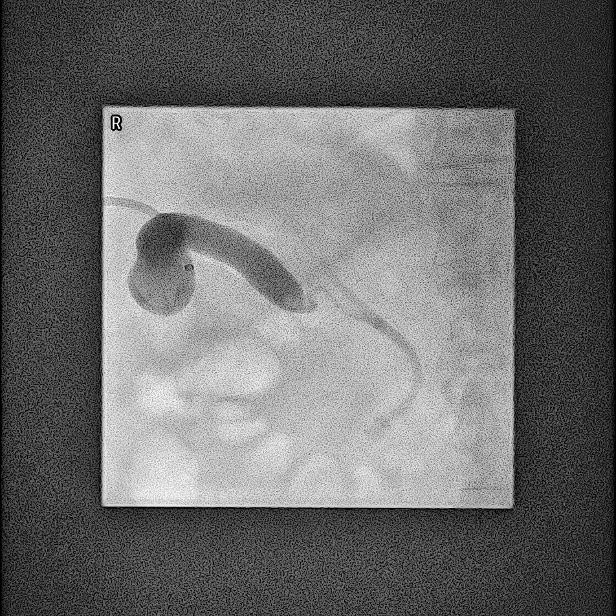
[frame 13/82]
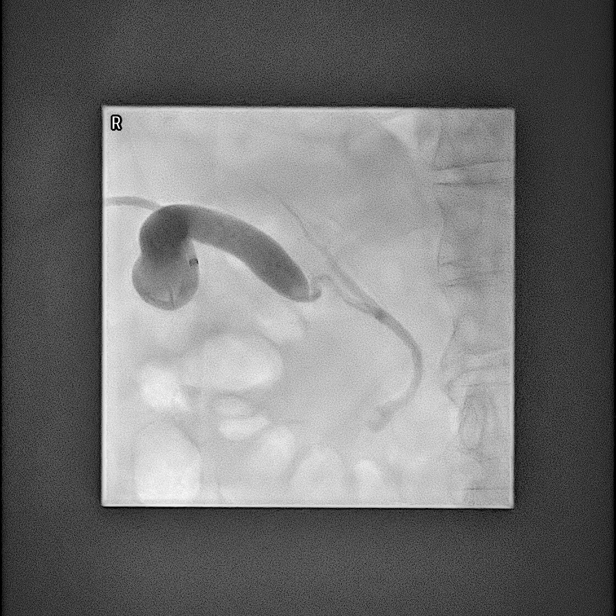
[frame 42/82]
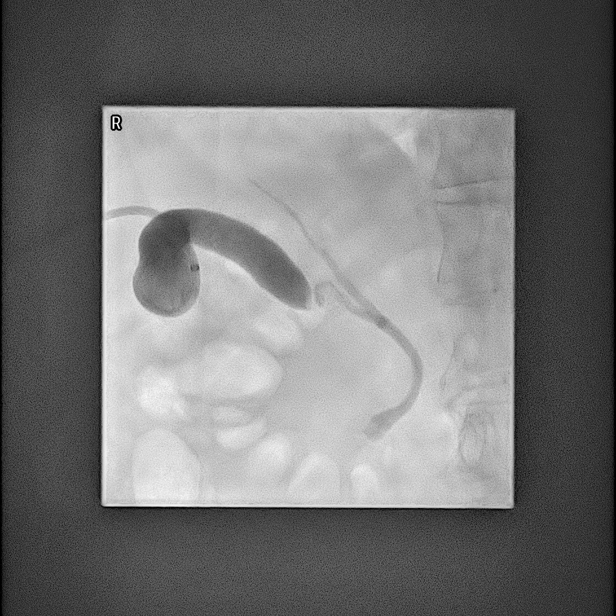
[frame 70/82]
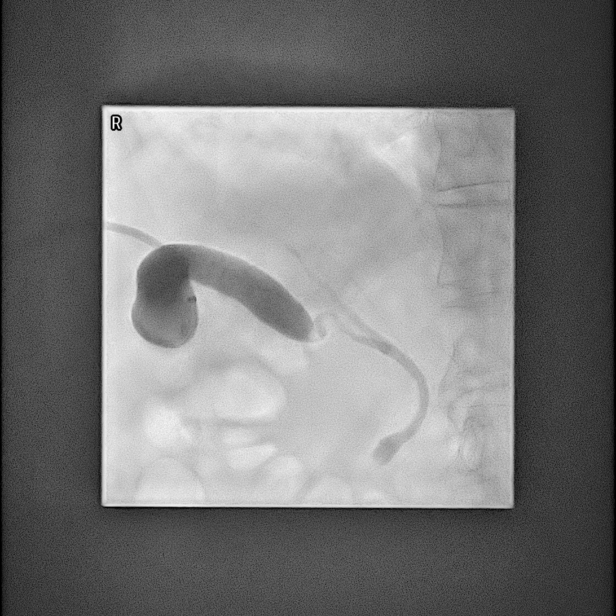

[Series 3: fluoroscopy - stored · 3 of 152 frames shown (3 of 4)]
[frame 1/152]
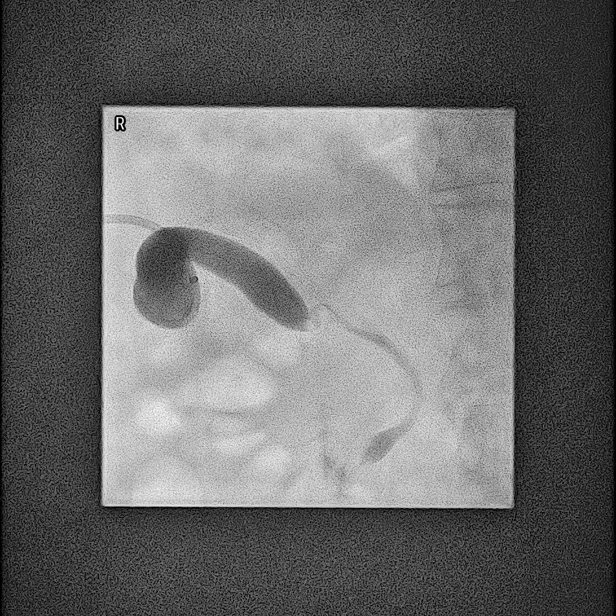
[frame 23/152]
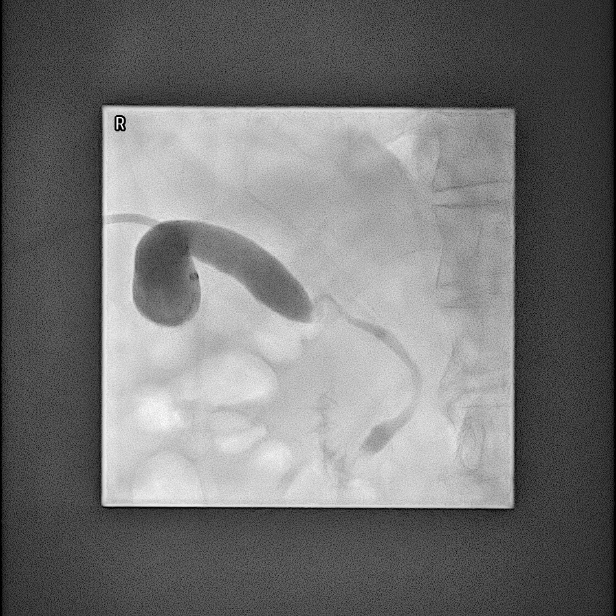
[frame 77/152]
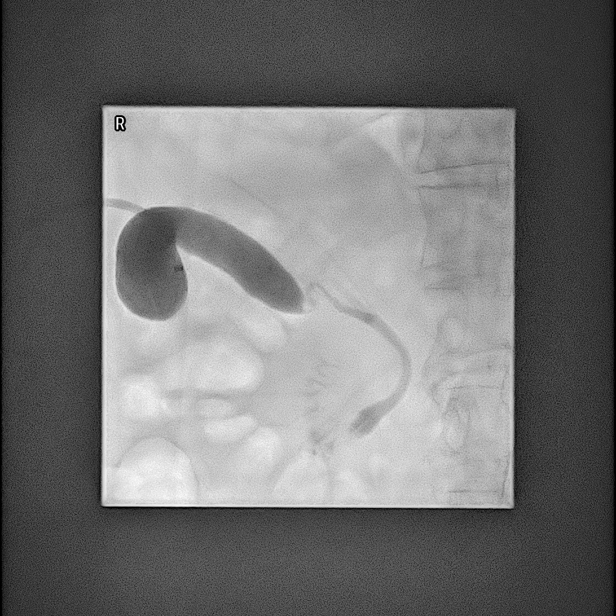

[Series 4: fluoroscopy - stored · 4 of 95 frames shown (4 of 4)]
[frame 1/95]
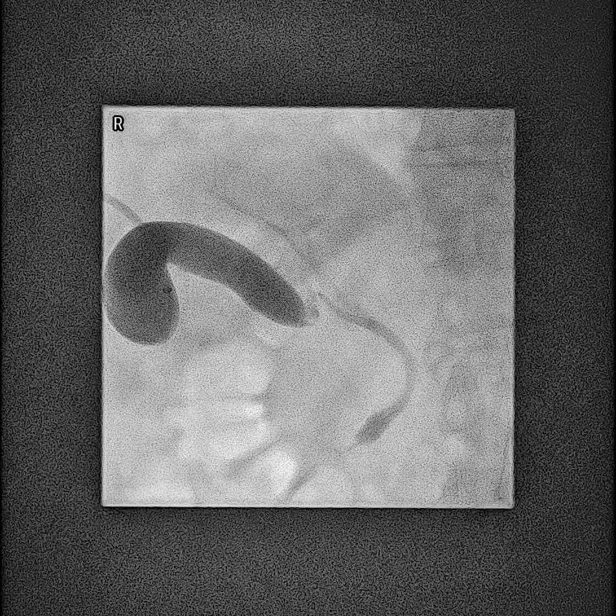
[frame 15/95]
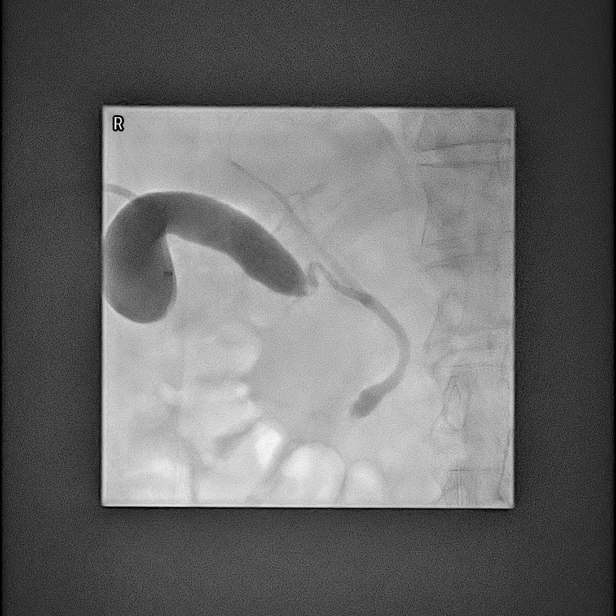
[frame 48/95]
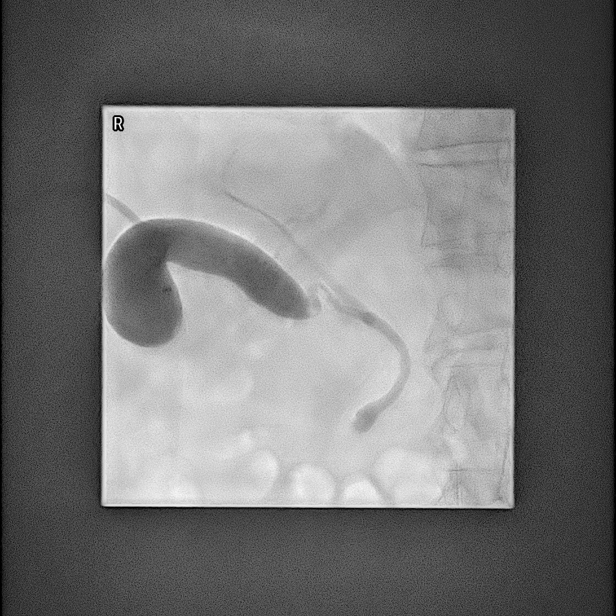
[frame 81/95]
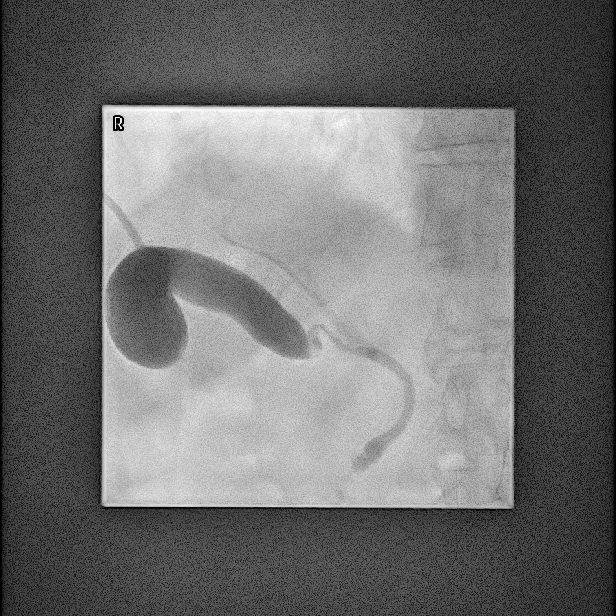

[14 of 16 positions shown; findings below may reference images not displayed]

EXAM:
CHOLANGIOGRAM THROUGH PRE-EXISTING CHOLECYSTOSTOMY TUBE UNDER
FLUOROSCOPY

MEDICATIONS:
None

ANESTHESIA/SEDATION:
None

FLUOROSCOPY TIME:  Fluoroscopy Time: 30 seconds.  3.7 mGy.

CONTRAST:  10 mL Visipaque 320

COMPLICATIONS:
None immediate.

PROCEDURE:
Informed written consent was obtained from the patient's daughter
after a thorough discussion of the procedural risks, benefits and
alternatives. All questions were addressed. Maximal Sterile Barrier
Technique was utilized including caps, mask, sterile gowns, sterile
gloves, sterile drape, hand hygiene and skin antiseptic. A timeout
was performed prior to the initiation of the procedure.

The pre-existing cholecystostomy tube was injected with contrast
material and multiple fluoroscopic imaging loops were saved. The
tube was then reconnected to a new gravity drainage bag. Additional
0 Prolene retention suture was applied at the catheter exit site.
FINDINGS: The cholecystostomy tube is well position within the gallbladder
lumen and does not appear to have retracted significantly. The tube
is normally patent with contrast injection demonstrating a
decompressed gallbladder, flow via a patent cystic duct and
opacification of the common bile duct. The distal portion of the
common bile duct shows mild dilatation and irregularity without
discrete filling defect identified. There is eventual visualization
of contrast entering the duodenum.
IMPRESSION: Appropriate positioning of cholecystostomy tube. The tube did not
require exchange or replacement. An additional retention suture was
applied at the catheter exit site. Cholangiogram demonstrates a
decompressed gallbladder, patent cystic duct and patent common bile
duct with mild dilatation and irregularity of the distal common bile
duct. No discrete calculi identified in the bile ducts.
